# Patient Record
Sex: Male | Born: 1950 | Race: Black or African American | Hispanic: No | Smoking: Current some day smoker
Health system: Southern US, Community
[De-identification: ages and names within clinical notes are randomized; demographics above are authoritative.]

## PROBLEM LIST (undated history)

## (undated) DIAGNOSIS — N189 Chronic kidney disease, unspecified: Secondary | ICD-10-CM

## (undated) DIAGNOSIS — R519 Headache, unspecified: Secondary | ICD-10-CM

## (undated) DIAGNOSIS — R55 Syncope and collapse: Secondary | ICD-10-CM

## (undated) DIAGNOSIS — K219 Gastro-esophageal reflux disease without esophagitis: Secondary | ICD-10-CM

## (undated) DIAGNOSIS — E119 Type 2 diabetes mellitus without complications: Secondary | ICD-10-CM

## (undated) DIAGNOSIS — M1990 Unspecified osteoarthritis, unspecified site: Secondary | ICD-10-CM

## (undated) DIAGNOSIS — E7800 Pure hypercholesterolemia, unspecified: Secondary | ICD-10-CM

## (undated) DIAGNOSIS — G4730 Sleep apnea, unspecified: Secondary | ICD-10-CM

## (undated) DIAGNOSIS — I10 Essential (primary) hypertension: Secondary | ICD-10-CM

## (undated) DIAGNOSIS — M109 Gout, unspecified: Secondary | ICD-10-CM

## (undated) DIAGNOSIS — R269 Unspecified abnormalities of gait and mobility: Secondary | ICD-10-CM

## (undated) DIAGNOSIS — R0602 Shortness of breath: Secondary | ICD-10-CM

## (undated) DIAGNOSIS — Z972 Presence of dental prosthetic device (complete) (partial): Secondary | ICD-10-CM

## (undated) DIAGNOSIS — I2699 Other pulmonary embolism without acute cor pulmonale: Secondary | ICD-10-CM

## (undated) HISTORY — DX: Type 2 diabetes mellitus without complications: E11.9

## (undated) HISTORY — PX: COLONOSCOPY: SHX174

## (undated) HISTORY — DX: Other pulmonary embolism without acute cor pulmonale: I26.99

## (undated) HISTORY — DX: Pure hypercholesterolemia, unspecified: E78.00

## (undated) HISTORY — PX: WRIST SURGERY: SHX841

## (undated) HISTORY — PX: MULTIPLE TOOTH EXTRACTIONS: SHX2053

## (undated) HISTORY — DX: Chronic kidney disease, unspecified: N18.9

## (undated) HISTORY — DX: Syncope and collapse: R55

## (undated) HISTORY — DX: Unspecified abnormalities of gait and mobility: R26.9

## (undated) HISTORY — DX: Essential (primary) hypertension: I10

## (undated) HISTORY — DX: Gout, unspecified: M10.9

## (undated) HISTORY — PX: CERVICAL DISCECTOMY: SHX98

## (undated) HISTORY — DX: Shortness of breath: R06.02

---

## 1993-01-03 HISTORY — PX: OTHER SURGICAL HISTORY: SHX169

## 2015-05-19 ENCOUNTER — Telehealth: Payer: Self-pay | Admitting: Cardiovascular Disease

## 2015-05-19 NOTE — Telephone Encounter (Signed)
Called number that was listed on pt's chart, the number that was listed was the number to a debt collector. There is no other contact number.

## 2015-05-22 DIAGNOSIS — R0600 Dyspnea, unspecified: Secondary | ICD-10-CM

## 2015-05-22 DIAGNOSIS — E7800 Pure hypercholesterolemia, unspecified: Secondary | ICD-10-CM | POA: Insufficient documentation

## 2015-05-22 DIAGNOSIS — M109 Gout, unspecified: Secondary | ICD-10-CM | POA: Insufficient documentation

## 2015-05-22 DIAGNOSIS — I2699 Other pulmonary embolism without acute cor pulmonale: Secondary | ICD-10-CM | POA: Insufficient documentation

## 2015-05-22 DIAGNOSIS — N189 Chronic kidney disease, unspecified: Secondary | ICD-10-CM | POA: Insufficient documentation

## 2015-05-22 DIAGNOSIS — R0609 Other forms of dyspnea: Secondary | ICD-10-CM

## 2015-05-25 ENCOUNTER — Telehealth: Payer: Self-pay | Admitting: Cardiovascular Disease

## 2015-05-25 ENCOUNTER — Encounter: Payer: Self-pay | Admitting: Cardiovascular Disease

## 2015-05-25 NOTE — Telephone Encounter (Signed)
No show for new patient visit today. He can be rescheduled with any cardiology provider. cdm

## 2015-05-25 NOTE — Progress Notes (Signed)
No show

## 2015-05-26 ENCOUNTER — Encounter: Payer: Self-pay | Admitting: Cardiovascular Disease

## 2015-10-03 ENCOUNTER — Emergency Department (HOSPITAL_COMMUNITY)
Admission: EM | Admit: 2015-10-03 | Discharge: 2015-10-03 | Disposition: A | Discharge status: Home / Self Care | Payer: Medicare HMO | Attending: Emergency Medicine | Admitting: Emergency Medicine

## 2015-10-03 ENCOUNTER — Emergency Department (HOSPITAL_COMMUNITY): Payer: Medicare HMO

## 2015-10-03 ENCOUNTER — Encounter (HOSPITAL_COMMUNITY): Payer: Self-pay

## 2015-10-03 DIAGNOSIS — S91115A Laceration without foreign body of left lesser toe(s) without damage to nail, initial encounter: Secondary | ICD-10-CM | POA: Diagnosis not present

## 2015-10-03 DIAGNOSIS — E1122 Type 2 diabetes mellitus with diabetic chronic kidney disease: Secondary | ICD-10-CM | POA: Insufficient documentation

## 2015-10-03 DIAGNOSIS — Y9301 Activity, walking, marching and hiking: Secondary | ICD-10-CM | POA: Diagnosis not present

## 2015-10-03 DIAGNOSIS — I129 Hypertensive chronic kidney disease with stage 1 through stage 4 chronic kidney disease, or unspecified chronic kidney disease: Secondary | ICD-10-CM | POA: Insufficient documentation

## 2015-10-03 DIAGNOSIS — F17210 Nicotine dependence, cigarettes, uncomplicated: Secondary | ICD-10-CM | POA: Insufficient documentation

## 2015-10-03 DIAGNOSIS — Y999 Unspecified external cause status: Secondary | ICD-10-CM | POA: Insufficient documentation

## 2015-10-03 DIAGNOSIS — Z79899 Other long term (current) drug therapy: Secondary | ICD-10-CM | POA: Diagnosis not present

## 2015-10-03 DIAGNOSIS — W2203XA Walked into furniture, initial encounter: Secondary | ICD-10-CM | POA: Diagnosis not present

## 2015-10-03 DIAGNOSIS — Y929 Unspecified place or not applicable: Secondary | ICD-10-CM | POA: Insufficient documentation

## 2015-10-03 DIAGNOSIS — N189 Chronic kidney disease, unspecified: Secondary | ICD-10-CM | POA: Diagnosis not present

## 2015-10-03 DIAGNOSIS — S91119A Laceration without foreign body of unspecified toe without damage to nail, initial encounter: Secondary | ICD-10-CM

## 2015-10-03 IMAGING — CR DG TOE 3RD 2+V*L*
3 series · 3 of 3 positions shown · non-contrast
Comparison: None.

CLINICAL DATA: Left third toe pain after injury this morning.
Lacerations to third and fourth toes.

EXAM:
LEFT THIRD TOE

[toe ap]
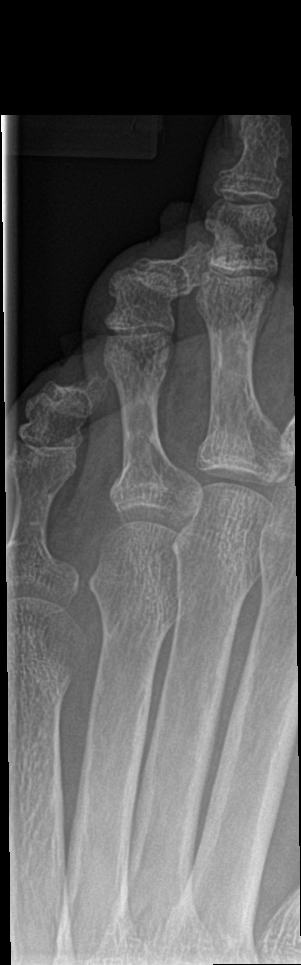

[toe obl]
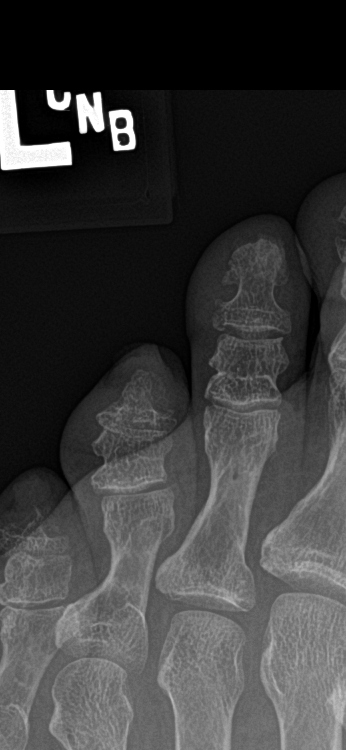

[toe lat]
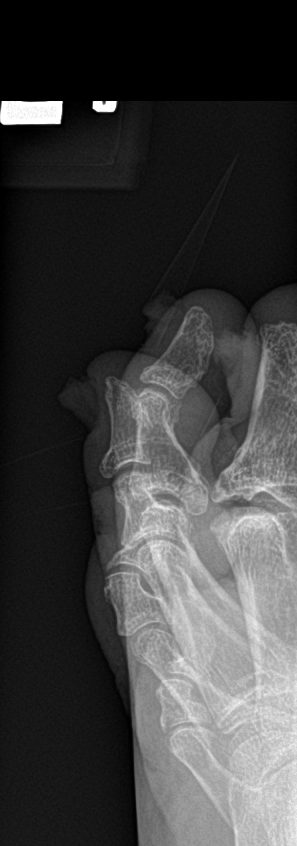

[3 of 3 positions shown; findings below may reference images not displayed]

FINDINGS: No osseous fracture line or displaced fracture fragment identified.
No joint space malalignment seen. Adjacent soft tissues are
unremarkable.
IMPRESSION: Negative.

## 2015-10-03 MED ORDER — HYDROCODONE-ACETAMINOPHEN 5-325 MG PO TABS: 1.0000 | ORAL_TABLET | Freq: Four times a day (QID) | ORAL | 0 refills | Status: DC | PRN

## 2015-10-03 MED ORDER — LIDOCAINE HCL 2 % IJ SOLN
10.0000 mL | Freq: Once | INTRAMUSCULAR | Status: AC
Start: 2015-10-03 — End: 2015-10-03
  Administered 2015-10-03: 200 mg via INTRADERMAL
  Filled 2015-10-03: qty 20

## 2015-10-03 NOTE — ED Triage Notes (Signed)
Onset this morning pt stepped on piece of bed cutting left middle toe on sole side.  Bleeding has not stopped.

## 2015-10-03 NOTE — ED Notes (Signed)
Declined W/C at D/C and was escorted to lobby by RN. 

## 2015-10-03 NOTE — Discharge Instructions (Signed)
Please follow up with your doctor or at [facility] in 1 week for wound recheck and sutures removal.  Monitor wound daily for signs of infection.  Take tylenol or ibuprofen at home as needed for pain. Return if you have any concerns.

## 2015-10-03 NOTE — ED Provider Notes (Signed)
[facility] DEPT Provider Note   CSN: [id number] Arrival date & time: 10/03/15  1637  By signing my name below, I, Reola Mosher, attest that this documentation has been scribed under the direction and in the presence of Domenic Moras, PA-C.  Electronically Signed: Reola Mosher, ED Scribe. 10/03/15. 4:57 PM.  History   Chief Complaint Chief Complaint  Patient presents with  . Laceration   The history is provided by the patient. No language interpreter was used.   HPI Comments: Duane Ortiz is a 65 y.o. male with a PMHx of CKD and HTN, who presents to the Emergency Department complaining of a laceration to the plantar aspect of the left third toe sustained ~7 hours PTA. Bleeding is controlled with pressure dressing. Pt reports that he was walking when the plantar aspect of his left 3rd toe came across a sharp edge of his bed, sustaining his laceration. Pt reports only mild pain to the area stemming from chronic decreased sensation to the plantar aspect of his foot. Prior to coming into the ED pt states that he only applied pressure dressing to control his bleeding, and notes that no other treatments were tried. Pt is currently taking Xarelto which he has been on for ~2 years for his hx of PE. Pt has been compliant with his daily anticoagulant medications. Pt denies a hx of DM. Tetanus it UTD. No other associated symptoms or complaints.   Past Medical History:  Diagnosis Date  . Chronic kidney disease   . Chronic kidney disease   . Diabetes ([place]) 02/012017  . Fainting   . Gout   . Hypercholesteremia   . Hypercholesterolemia   . Hypertension   . Pulmonary embolism ([place])   . Shortness of breath    Patient Active Problem List   Diagnosis Date Noted  . Dyspnea on exertion 05/22/2015  . Hypercholesteremia   . Gout   . Pulmonary embolism ([place])   . Chronic kidney disease   . Hypercholesterolemia    Past Surgical History:  Procedure Laterality Date  . CERVICAL  DISCECTOMY    . COLONOSCOPY    . neck spine disk surgery  01/03/1993    Home Medications    Prior to Admission medications   Medication Sig Start Date End Date Taking? Authorizing Provider  allopurinol (ZYLOPRIM) 100 MG tablet Take 100 mg by mouth daily.    Historical Provider, MD  amLODipine-valsartan (EXFORGE) 10-320 MG tablet Take 1 tablet by mouth daily.    Historical Provider, MD  atorvastatin (LIPITOR) 20 MG tablet Take 20 mg by mouth daily.    Historical Provider, MD  cloNIDine HCl (KAPVAY) 0.1 MG TB12 ER tablet Take 0.1 mg by mouth 2 (two) times daily.    Historical Provider, MD  fenofibrate micronized (LOFIBRA) 134 MG capsule Take 134 mg by mouth daily before breakfast.    Historical Provider, MD  fluticasone (FLONASE) 50 MCG/ACT nasal spray Place into both nostrils daily.    Historical Provider, MD  furosemide (LASIX) 20 MG tablet Take 20 mg by mouth.    Historical Provider, MD  HYDROcodone-acetaminophen (NORCO/VICODIN) 5-325 MG tablet Take 1 tablet by mouth every 6 (six) hours as needed for moderate pain.    Historical Provider, MD  nebivolol (BYSTOLIC) 10 MG tablet Take 10 mg by mouth daily.    Historical Provider, MD  omeprazole (PRILOSEC) 20 MG capsule Take 20 mg by mouth daily.    Historical Provider, MD  pravastatin (PRAVACHOL) 40 MG tablet Take 40 mg by  mouth daily.    Historical Provider, MD  pregabalin (LYRICA) 75 MG capsule Take 75 mg by mouth 2 (two) times daily.    Historical Provider, MD  rivaroxaban (XARELTO) 20 MG TABS tablet Take 20 mg by mouth daily with supper.    Historical Provider, MD  spironolactone (ALDACTONE) 50 MG tablet Take 50 mg by mouth daily.    Historical Provider, MD  tamsulosin (FLOMAX) 0.4 MG CAPS capsule Take 0.4 mg by mouth daily.    Historical Provider, MD  traZODone (DESYREL) 50 MG tablet Take 50 mg by mouth at bedtime.    Historical Provider, MD   Family History Family History  Problem Relation Age of Onset  . Diabetes Mother   . Heart  disease Mother   . Hypertension Mother   . Diabetes Sister   . Hypertension Sister   . Diabetes Brother   . Hypertension Brother   . Breast cancer Paternal Grandmother    Social History Social History  Substance Use Topics  . Smoking status: Current Every Day Smoker    Packs/day: 0.25    Types: Cigarettes  . Smokeless tobacco: Never Used  . Alcohol use 0.0 oz/week   Allergies   Sulfa antibiotics  Review of Systems Review of Systems  Musculoskeletal: Positive for myalgias.  Skin: Positive for wound.  Neurological: Positive for numbness.   Physical Exam Updated Vital Signs BP 142/65 (BP Location: Left Arm)   Pulse 62   Temp 97.7 F (36.5 C) (Oral)   Resp 14   SpO2 100%   Physical Exam  Constitutional: He appears well-developed and well-nourished.  HENT:  Head: Normocephalic.  Eyes: Conjunctivae are normal.  Cardiovascular: Normal rate.   Pulmonary/Chest: Effort normal. No respiratory distress.  Abdominal: He exhibits no distension.  Musculoskeletal:  Left foot over 3rd toe involving the IP joint w/ a complete deep 2cm laceration to the the ventral aspect of the digit w/ bone exposure. Decreased sensation to the toe but area with brisk capillary refill.   Neurological: He is alert.  Skin: Skin is warm and dry.  Psychiatric: He has a normal mood and affect. His behavior is normal.  Nursing note and vitals reviewed.  ED Treatments / Results  DIAGNOSTIC STUDIES: Oxygen Saturation is 100% on RA, normal by my interpretation.   COORDINATION OF CARE: 4:51 PM-Discussed next steps with pt. Pt verbalized understanding and is agreeable with the plan.   Radiology Dg Toe 3rd Left  Result Date: 10/03/2015 CLINICAL DATA:  Left third toe pain after injury this morning. Lacerations to third and fourth toes. EXAM: LEFT THIRD TOE COMPARISON:  None. FINDINGS: No osseous fracture line or displaced fracture fragment identified. No joint space malalignment seen. Adjacent soft  tissues are unremarkable. IMPRESSION: Negative. Electronically Signed   By: Franki Cabot M.D.   On: 10/03/2015 17:44   Procedures .Marland KitchenLaceration Repair Date/Time: 10/03/2015 6:06 PM Performed by: Domenic Moras Authorized by: Domenic Moras   Consent:    Consent obtained:  Verbal   Consent given by:  Patient   Risks discussed:  Infection, pain and poor wound healing   Alternatives discussed:  No treatment Universal protocol:    Procedure explained and questions answered to patient or proxy's satisfaction: yes     Relevant documents present and verified: yes     Test results available and properly labeled: yes     Imaging studies available: yes     Required blood products, implants, devices, and special equipment available: yes  Site/side marked: yes     Immediately prior to procedure, a time out was called: yes     Patient identity confirmed:  Verbally with patient Anesthesia (see MAR for exact dosages):    Anesthesia method:  Nerve block   Block location:  Left third toe   Block needle gauge:  25 G   Block anesthetic:  Lidocaine 2% w/o epi   Block technique:  Digital   Block outcome:  Anesthesia achieved Laceration details:    Location:  Foot   Foot location:  Sole of L foot   Length (cm):  2   Laceration depth: deep. Repair type:    Repair type:  Simple Pre-procedure details:    Preparation:  Patient was prepped and draped in usual sterile fashion and imaging obtained to evaluate for foreign bodies Exploration:    Wound exploration: wound explored through full range of motion and entire depth of wound probed and visualized     Wound extent: no tendon damage noted     Contaminated: no   Treatment:    Amount of cleaning:  Extensive   Irrigation solution:  Sterile saline   Irrigation method:  Pressure wash   Visualized foreign bodies/material removed: no   Skin repair:    Repair method:  Sutures   Suture size:  5-0   Suture material:  Prolene   Suture technique:  Simple  interrupted   Number of sutures:  5 Approximation:    Approximation:  Close   Vermilion border: well-aligned   Post-procedure details:    Dressing:  Antibiotic ointment and sterile dressing   Patient tolerance of procedure:  Tolerated well, no immediate complications      Medications Ordered in ED Medications  lidocaine (XYLOCAINE) 2 % (with pres) injection 200 mg (200 mg Intradermal Given 10/03/15 1703)   Initial Impression / Assessment and Plan / ED Course  I have reviewed the triage vital signs and the nursing notes.  Pertinent labs & imaging results that were available during my care of the patient were reviewed by me and considered in my medical decision making (see chart for details).  Clinical Course   Pt is 65 yo male who presents to the ED with complete deep skin avulsion at approximately 2cm in length, involving the IP region of the left third toe s/p stepping on a sharp object approximately 7 hours ago. Bleeding controlled while in the ED. Tdap booster UTD. Pressure irrigation performed. Bottom of the wound visualized with bleeding controled. Laceration occurred <8 hours prior to repair which was well tolerated. Pertinent xray of the left 3rd toe reveals no bone abnormality or foreign body involvement. Pt is currently on Xarelto for his hx of PE ~2 years ago and has been compliant with this medication. Discussed suture home care w/ pt and answered questions. Pt to f-u for wound check and suture removal in ~7 days. Pt is hemodynamically stable w no complaints prior to dc. Care discussed with Dr. Tyrone Nine.   Final Clinical Impressions(s) / ED Diagnoses   Final diagnoses:  Toe laceration, initial encounter   New Prescriptions New Prescriptions   No medications on file   I personally performed the services described in this documentation, which was scribed in my presence. The recorded information has been reviewed and is accurate.       Domenic Moras, PA-C 10/03/15 [place], DO 10/03/15 [place], DO 10/03/15 2309

## 2015-10-10 ENCOUNTER — Encounter (HOSPITAL_COMMUNITY): Payer: Self-pay | Admitting: *Deleted

## 2015-10-10 ENCOUNTER — Emergency Department (HOSPITAL_COMMUNITY)
Admission: EM | Admit: 2015-10-10 | Discharge: 2015-10-10 | Disposition: A | Discharge status: Home / Self Care | Payer: Medicare HMO | Attending: Emergency Medicine | Admitting: Emergency Medicine

## 2015-10-10 DIAGNOSIS — E1122 Type 2 diabetes mellitus with diabetic chronic kidney disease: Secondary | ICD-10-CM | POA: Diagnosis not present

## 2015-10-10 DIAGNOSIS — Z7901 Long term (current) use of anticoagulants: Secondary | ICD-10-CM | POA: Insufficient documentation

## 2015-10-10 DIAGNOSIS — N189 Chronic kidney disease, unspecified: Secondary | ICD-10-CM | POA: Diagnosis not present

## 2015-10-10 DIAGNOSIS — I129 Hypertensive chronic kidney disease with stage 1 through stage 4 chronic kidney disease, or unspecified chronic kidney disease: Secondary | ICD-10-CM | POA: Diagnosis not present

## 2015-10-10 DIAGNOSIS — F17210 Nicotine dependence, cigarettes, uncomplicated: Secondary | ICD-10-CM | POA: Insufficient documentation

## 2015-10-10 DIAGNOSIS — Z4802 Encounter for removal of sutures: Secondary | ICD-10-CM | POA: Diagnosis not present

## 2015-10-10 NOTE — ED Triage Notes (Signed)
Pt here for suture removal to left foot. He has no complaints. Denies fever or drainage. No acute distress noted at triage.

## 2015-10-10 NOTE — Discharge Instructions (Signed)
Please read and follow all provided instructions.  Your diagnoses today include:  1. Visit for suture removal    Tests performed today include: Vital signs. See below for your results today.   Medications prescribed:  Take as prescribed   Home care instructions:  Follow any educational materials contained in this packet.  Follow-up instructions: Please follow-up with your primary care provider for further evaluation of symptoms and treatment   Return instructions:  Please return to the Emergency Department if you do not get better, if you get worse, or new symptoms OR  - Fever (temperature greater than 101.81F)  - Bleeding that does not stop with holding pressure to the area    -Severe pain (please note that you may be more sore the day after your accident)  - Chest Pain  - Difficulty breathing  - Severe nausea or vomiting  - Inability to tolerate food and liquids  - Passing out  - Skin becoming red around your wounds  - Change in mental status (confusion or lethargy)  - New numbness or weakness    Please return if you have any other emergent concerns.  Additional Information:  Your vital signs today were: BP 122/61 (BP Location: Left Arm)    Pulse 61    Temp 97.8 F (36.6 C) (Oral)    Resp 14    SpO2 100%  If your blood pressure (BP) was elevated above 135/85 this visit, please have this repeated by your doctor within one month. ---------------

## 2015-10-10 NOTE — ED Notes (Signed)
Declined W/C at D/C and was escorted to lobby by RN. 

## 2015-10-10 NOTE — ED Provider Notes (Signed)
[facility] DEPT Provider Note   CSN: [id number] Arrival date & time: 10/10/15  1325   By signing my name below, I, Macon Large, attest that this documentation has been prepared under the direction and in the presence of  Shary Decamp PA-C. Electronically Signed: Macon Large, ED Scribe. 10/10/15. 2:38 PM.   History   Chief Complaint Chief Complaint  Patient presents with  . Suture / Staple Removal    The history is provided by the patient. No language interpreter was used.    HPI Comments: Duane Ortiz is a 65 y.o. male who presents to the Emergency Department requesting suture removal today. Pt was seen on 10/03/2015 for a laceration to the plantar aspect of the left foot; per chart review Pt had 5 sutures placed. He denies associated drainage, redness, and increased pain.   Past Medical History:  Diagnosis Date  . Chronic kidney disease   . Chronic kidney disease   . Diabetes ([place]) 02/012017  . Fainting   . Gout   . Hypercholesteremia   . Hypercholesterolemia   . Hypertension   . Pulmonary embolism ([place])   . Shortness of breath     Patient Active Problem List   Diagnosis Date Noted  . Dyspnea on exertion 05/22/2015  . Hypercholesteremia   . Gout   . Pulmonary embolism ([place])   . Chronic kidney disease   . Hypercholesterolemia     Past Surgical History:  Procedure Laterality Date  . CERVICAL DISCECTOMY    . COLONOSCOPY    . neck spine disk surgery  01/03/1993     Home Medications    Prior to Admission medications   Medication Sig Start Date End Date Taking? Authorizing Provider  allopurinol (ZYLOPRIM) 100 MG tablet Take 100 mg by mouth daily.    Historical Provider, MD  amLODipine-valsartan (EXFORGE) 10-320 MG tablet Take 1 tablet by mouth daily.    Historical Provider, MD  atorvastatin (LIPITOR) 20 MG tablet Take 20 mg by mouth daily.    Historical Provider, MD  cloNIDine HCl (KAPVAY) 0.1 MG TB12 ER tablet Take 0.1 mg by mouth 2 (two) times  daily.    Historical Provider, MD  fenofibrate micronized (LOFIBRA) 134 MG capsule Take 134 mg by mouth daily before breakfast.    Historical Provider, MD  fluticasone (FLONASE) 50 MCG/ACT nasal spray Place into both nostrils daily.    Historical Provider, MD  furosemide (LASIX) 20 MG tablet Take 20 mg by mouth.    Historical Provider, MD  HYDROcodone-acetaminophen (NORCO/VICODIN) 5-325 MG tablet Take 1 tablet by mouth every 6 (six) hours as needed for moderate pain. 10/03/15   Domenic Moras, PA-C  nebivolol (BYSTOLIC) 10 MG tablet Take 10 mg by mouth daily.    Historical Provider, MD  omeprazole (PRILOSEC) 20 MG capsule Take 20 mg by mouth daily.    Historical Provider, MD  pravastatin (PRAVACHOL) 40 MG tablet Take 40 mg by mouth daily.    Historical Provider, MD  pregabalin (LYRICA) 75 MG capsule Take 75 mg by mouth 2 (two) times daily.    Historical Provider, MD  rivaroxaban (XARELTO) 20 MG TABS tablet Take 20 mg by mouth daily with supper.    Historical Provider, MD  spironolactone (ALDACTONE) 50 MG tablet Take 50 mg by mouth daily.    Historical Provider, MD  tamsulosin (FLOMAX) 0.4 MG CAPS capsule Take 0.4 mg by mouth daily.    Historical Provider, MD  traZODone (DESYREL) 50 MG tablet Take 50 mg by mouth at bedtime.  Historical Provider, MD    Family History Family History  Problem Relation Age of Onset  . Diabetes Mother   . Heart disease Mother   . Hypertension Mother   . Diabetes Sister   . Hypertension Sister   . Diabetes Brother   . Hypertension Brother   . Breast cancer Paternal Grandmother     Social History Social History  Substance Use Topics  . Smoking status: Current Every Day Smoker    Packs/day: 0.25    Types: Cigarettes  . Smokeless tobacco: Never Used  . Alcohol use 0.0 oz/week     Allergies   Sulfa antibiotics   Review of Systems Review of Systems  Constitutional: Negative for fever.  Skin: Positive for wound.   Physical Exam Updated Vital  Signs BP 122/61 (BP Location: Left Arm)   Pulse 61   Temp 97.8 F (36.6 C) (Oral)   Resp 14   SpO2 100%   Physical Exam  Constitutional: He is oriented to person, place, and time. Vital signs are normal. He appears well-developed and well-nourished.  HENT:  Head: Normocephalic and atraumatic.  Right Ear: Hearing normal.  Left Ear: Hearing normal.  Eyes: Conjunctivae and EOM are normal. Pupils are equal, round, and reactive to light. Right eye exhibits no discharge. Left eye exhibits no discharge.  Cardiovascular: Normal rate and regular rhythm.   Pulmonary/Chest: Effort normal. No respiratory distress.  Musculoskeletal:  Well healed 2 cm laceration to the ventral aspect of the left third toe. Scabbing noted. No erythema. No drainage. No signs of infection. Four prolene sutures noted   Neurological: He is alert and oriented to person, place, and time. Coordination normal.  Skin: Skin is warm and dry. No rash noted. He is not diaphoretic. No erythema.  Psychiatric: He has a normal mood and affect. His speech is normal and behavior is normal. Thought content normal.  Nursing note and vitals reviewed.  ED Treatments / Results   DIAGNOSTIC STUDIES: Oxygen Saturation is 100% on RA, normal by my interpretation.    COORDINATION OF CARE: 2:38 PM Discussed treatment plan with pt at bedside and pt agreed to plan.  Labs (all labs ordered are listed, but only abnormal results are displayed) Labs Reviewed - No data to display  EKG  EKG Interpretation None      Radiology No results found.  Procedures .Suture Removal Date/Time: 10/10/2015 2:38 PM Performed by: Shary Decamp Authorized by: Shary Decamp   Consent:    Consent obtained:  Verbal   Consent given by:  Patient Location:    Location:  Lower extremity   Lower extremity location:  Toe   Toe location:  L third toe Procedure details:    Wound appearance:  No signs of infection   Number of sutures removed:   4 Post-procedure details:    Patient tolerance of procedure:  Tolerated well, no immediate complications   (including critical care time)  Medications Ordered in ED Medications - No data to display   Initial Impression / Assessment and Plan / ED Course  I have reviewed the triage vital signs and the nursing notes.  Pertinent labs & imaging results that were available during my care of the patient were reviewed by me and considered in my medical decision making (see chart for details).  Clinical Course  I have reviewed the relevant previous healthcare records. I obtained HPI from historian. Patient discussed with supervising physician  ED Course:  Assessment: Suture removal   Pt to ER for  suture removal and wound check as above. Procedure tolerated well. 4 sutures were removed even though documentation reports 5. Explored wound throughly without evidence of 5th suture placement. Discussed risks of retained foreign bodies, but I believe it is highly unlikely that a 5th was placed. Most likely documentation error. Pt confirmed that he was told verbally that 4 sutures were placed. Vitals normal, no signs of infection. Scar minimization & return precautions given at dc.   Disposition/Plan:  DC Home Additional Verbal discharge instructions given and discussed with patient.  Pt Instructed to f/u with PCP in the next week for evaluation and treatment of symptoms. Return precautions given Pt acknowledges and agrees with plan  Supervising Physician No att. providers found   Final Clinical Impressions(s) / ED Diagnoses   Final diagnoses:  Visit for suture removal    New Prescriptions Discharge Medication List as of 10/10/2015  2:13 PM     I personally performed the services described in this documentation, which was scribed in my presence. The recorded information has been reviewed and is accurate.     Shary Decamp, PA-C 10/10/15 1500    Dorie Rank, MD 10/11/15 [phone]

## 2016-02-16 ENCOUNTER — Emergency Department (HOSPITAL_COMMUNITY)
Admission: EM | Admit: 2016-02-16 | Discharge: 2016-02-17 | Disposition: A | Discharge status: Home / Self Care | Payer: Medicare HMO | Attending: Emergency Medicine | Admitting: Emergency Medicine

## 2016-02-16 ENCOUNTER — Encounter (HOSPITAL_COMMUNITY): Payer: Self-pay | Admitting: Emergency Medicine

## 2016-02-16 DIAGNOSIS — F17210 Nicotine dependence, cigarettes, uncomplicated: Secondary | ICD-10-CM | POA: Diagnosis not present

## 2016-02-16 DIAGNOSIS — Z79899 Other long term (current) drug therapy: Secondary | ICD-10-CM | POA: Diagnosis not present

## 2016-02-16 DIAGNOSIS — Z7901 Long term (current) use of anticoagulants: Secondary | ICD-10-CM | POA: Insufficient documentation

## 2016-02-16 DIAGNOSIS — E1122 Type 2 diabetes mellitus with diabetic chronic kidney disease: Secondary | ICD-10-CM | POA: Diagnosis not present

## 2016-02-16 DIAGNOSIS — I129 Hypertensive chronic kidney disease with stage 1 through stage 4 chronic kidney disease, or unspecified chronic kidney disease: Secondary | ICD-10-CM | POA: Diagnosis not present

## 2016-02-16 DIAGNOSIS — E875 Hyperkalemia: Secondary | ICD-10-CM | POA: Diagnosis not present

## 2016-02-16 DIAGNOSIS — N189 Chronic kidney disease, unspecified: Secondary | ICD-10-CM | POA: Insufficient documentation

## 2016-02-16 LAB — I-STAT TROPONIN, ED: Troponin i, poc: 0 ng/mL (ref 0.00–0.08)

## 2016-02-16 LAB — BASIC METABOLIC PANEL
ANION GAP: 9 (ref 5–15)
BUN: 46 mg/dL — ABNORMAL HIGH (ref 6–20)
CALCIUM: 9.3 mg/dL (ref 8.9–10.3)
CHLORIDE: 112 mmol/L — AB (ref 101–111)
CO2: 16 mmol/L — AB (ref 22–32)
Creatinine, Ser: 2.97 mg/dL — ABNORMAL HIGH (ref 0.61–1.24)
GFR calc Af Amer: 24 mL/min — ABNORMAL LOW (ref >60)
GFR calc non Af Amer: 20 mL/min — ABNORMAL LOW (ref >60)
Glucose, Bld: 108 mg/dL — ABNORMAL HIGH (ref 65–99)
Potassium: 5.3 mmol/L — ABNORMAL HIGH (ref 3.5–5.1)
Sodium: 137 mmol/L (ref 135–145)

## 2016-02-16 LAB — CBC
HCT: 28 % — ABNORMAL LOW (ref 39.0–52.0)
HEMOGLOBIN: 9 g/dL — AB (ref 13.0–17.0)
MCH: 27.4 pg (ref 26.0–34.0)
MCHC: 32.1 g/dL (ref 30.0–36.0)
MCV: 85.1 fL (ref 78.0–100.0)
Platelets: 215 10*3/uL (ref 150–400)
RBC: 3.29 MIL/uL — ABNORMAL LOW (ref 4.22–5.81)
RDW: 13.8 % (ref 11.5–15.5)
WBC: 6 10*3/uL (ref 4.0–10.5)

## 2016-02-16 MED ORDER — HYDROXYZINE HCL 10 MG PO TABS
10.0000 mg | ORAL_TABLET | Freq: Once | ORAL | Status: AC
  Administered 2016-02-16: 10 mg via ORAL
  Filled 2016-02-16: qty 1

## 2016-02-16 MED ORDER — SODIUM CHLORIDE 0.9 % IV BOLUS (SEPSIS)
1000.0000 mL | Freq: Once | INTRAVENOUS | Status: AC
Start: 2016-02-16 — End: 2016-02-17
  Administered 2016-02-16: 1000 mL via INTRAVENOUS

## 2016-02-16 NOTE — ED Provider Notes (Signed)
[facility] DEPT Provider Note   CSN: [id number] Arrival date & time: 02/16/16  1911     History   Chief Complaint Chief Complaint  Patient presents with  . Abnormal Lab    HPI Duane Ortiz is a 66 y.o. male.  HPI  Patient presents from home with concern of hyperkalemia. The patient is here with his son who assists with the history of present illness. Patient has known chronic kidney disease, as well as multiple other medical problems. Today, as a part of routine follow-up he had blood draw. He was called after the appointment with notification of critical abnormality, potassium greater than 6.8. He states that he feels generally well, denies chest pain, dyspnea, lightheadedness, syncope. Patient's son states the patient himself has been more sleepy, tired than usual, but also denies any other substantial changes. The patient himself denies these changes. Patient has ongoing evaluation for chronic kidney disease, but is not currently receiving dialysis.  He states that his other medical conditions are well controlled, with no recent changes in medication, diet, activity.   Past Medical History:  Diagnosis Date  . Chronic kidney disease   . Chronic kidney disease   . Diabetes ([place]) 02/012017  . Fainting   . Gout   . Hypercholesteremia   . Hypercholesterolemia   . Hypertension   . Pulmonary embolism ([place])   . Shortness of breath     Patient Active Problem List   Diagnosis Date Noted  . Dyspnea on exertion 05/22/2015  . Hypercholesteremia   . Gout   . Pulmonary embolism ([place])   . Chronic kidney disease   . Hypercholesterolemia     Past Surgical History:  Procedure Laterality Date  . CERVICAL DISCECTOMY    . COLONOSCOPY    . neck spine disk surgery  01/03/1993       Home Medications    Prior to Admission medications   Medication Sig Start Date End Date Taking? Authorizing Provider  allopurinol (ZYLOPRIM) 100 MG tablet Take 100 mg by mouth daily.     Historical Provider, MD  amLODipine-valsartan (EXFORGE) 10-320 MG tablet Take 1 tablet by mouth daily.    Historical Provider, MD  atorvastatin (LIPITOR) 20 MG tablet Take 20 mg by mouth daily.    Historical Provider, MD  cloNIDine HCl (KAPVAY) 0.1 MG TB12 ER tablet Take 0.1 mg by mouth 2 (two) times daily.    Historical Provider, MD  fenofibrate micronized (LOFIBRA) 134 MG capsule Take 134 mg by mouth daily before breakfast.    Historical Provider, MD  fluticasone (FLONASE) 50 MCG/ACT nasal spray Place into both nostrils daily.    Historical Provider, MD  furosemide (LASIX) 20 MG tablet Take 20 mg by mouth.    Historical Provider, MD  HYDROcodone-acetaminophen (NORCO/VICODIN) 5-325 MG tablet Take 1 tablet by mouth every 6 (six) hours as needed for moderate pain. 10/03/15   Domenic Moras, PA-C  nebivolol (BYSTOLIC) 10 MG tablet Take 10 mg by mouth daily.    Historical Provider, MD  omeprazole (PRILOSEC) 20 MG capsule Take 20 mg by mouth daily.    Historical Provider, MD  pravastatin (PRAVACHOL) 40 MG tablet Take 40 mg by mouth daily.    Historical Provider, MD  pregabalin (LYRICA) 75 MG capsule Take 75 mg by mouth 2 (two) times daily.    Historical Provider, MD  rivaroxaban (XARELTO) 20 MG TABS tablet Take 20 mg by mouth daily with supper.    Historical Provider, MD  spironolactone (ALDACTONE) 50 MG tablet Take  50 mg by mouth daily.    Historical Provider, MD  tamsulosin (FLOMAX) 0.4 MG CAPS capsule Take 0.4 mg by mouth daily.    Historical Provider, MD  traZODone (DESYREL) 50 MG tablet Take 50 mg by mouth at bedtime.    Historical Provider, MD    Family History Family History  Problem Relation Age of Onset  . Diabetes Mother   . Heart disease Mother   . Hypertension Mother   . Diabetes Sister   . Hypertension Sister   . Diabetes Brother   . Hypertension Brother   . Breast cancer Paternal Grandmother     Social History Social History  Substance Use Topics  . Smoking status: Current  Some Day Smoker    Packs/day: 0.25    Types: Cigarettes  . Smokeless tobacco: Never Used  . Alcohol use 0.0 oz/week     Allergies   Sulfa antibiotics   Review of Systems Review of Systems  Constitutional:       Per HPI, otherwise negative  HENT:       Per HPI, otherwise negative  Respiratory:       Per HPI, otherwise negative  Cardiovascular:       Per HPI, otherwise negative  Gastrointestinal: Negative for vomiting.  Endocrine:       Negative aside from HPI  Genitourinary:       Neg aside from HPI   Musculoskeletal:       Per HPI, otherwise negative  Skin: Negative.   Allergic/Immunologic: Negative for immunocompromised state.  Neurological: Negative for syncope.     Physical Exam Updated Vital Signs BP 120/67   Pulse 71   Temp 97.9 F (36.6 C) (Oral)   Resp 18   SpO2 98%   Physical Exam  Constitutional: He is oriented to person, place, and time. He appears well-developed. No distress.  HENT:  Head: Normocephalic and atraumatic.  Eyes: Conjunctivae and EOM are normal.  Cardiovascular: Normal rate and regular rhythm.   Pulmonary/Chest: Effort normal. No stridor. No respiratory distress.  Abdominal: He exhibits no distension.  Musculoskeletal: He exhibits no edema.  Neurological: He is alert and oriented to person, place, and time.  Skin: Skin is warm and dry.     Psychiatric: He has a normal mood and affect.  Nursing note and vitals reviewed.    ED Treatments / Results  Labs (all labs ordered are listed, but only abnormal results are displayed) Labs Reviewed  BASIC METABOLIC PANEL - Abnormal; Notable for the following:       Result Value   Potassium 5.3 (*)    Chloride 112 (*)    CO2 16 (*)    Glucose, Bld 108 (*)    BUN 46 (*)    Creatinine, Ser 2.97 (*)    GFR calc non Af Amer 20 (*)    GFR calc Af Amer 24 (*)    All other components within normal limits  CBC - Abnormal; Notable for the following:    RBC 3.29 (*)    Hemoglobin 9.0 (*)      HCT 28.0 (*)    All other components within normal limits  I-STAT TROPOININ, ED    EKG  EKG Interpretation  Date/Time:  Tuesday February 16 2016 20:04:00 EST Ventricular Rate:  60 PR Interval:  242 QRS Duration: 74 QT Interval:  408 QTC Calculation: 408 R Axis:   39 Text Interpretation:  Sinus rhythm with sinus arrhythmia with 1st degree A-V block Low voltage QRS  Artifact Abnormal ekg Confirmed by Carmin Muskrat  MD [phone]) on 02/16/2016 10:11:23 PM        Procedures Procedures (including critical care time)  Medications Ordered in ED Medications  sodium chloride 0.9 % bolus 1,000 mL (not administered)   Initial labs reassuring with potassium 5.3, substantially down from reported value at outpatient care. Patient will receive IV fluids, half potassium rechecked.  Repeat evaluation reassuring, potassium unremarkable I discussed the importance of following with primary care for discussion of medications, with consideration of this contributing to his hyperkalemia in addition to his ongoing renal disease contribution.  This gentleman with no chronic kidney disease presents with concern of hyperkalemia. Patient has unremarkable rash, is otherwise in no distress, is awake, alert, speaking clearly. EKG reassuring, No evidence for arrhythmia, nor substantial T-wave changes. Review of medications is notable for the patient taking spironolactone, recently stopping Lasix. Patient will be instructed to stop taking spironolactone. Patient discharged with close outpatient follow-up.  Initial Impression / Assessment and Plan / ED Course  I have reviewed the triage vital signs and the nursing notes.  Pertinent labs & imaging results that were available during my care of the patient were reviewed by me and considered in my medical decision making (see chart for details).      Carmin Muskrat, MD 02/16/16 2326

## 2016-02-16 NOTE — ED Triage Notes (Signed)
Patient arrives with hyperkalemia. Received call from nephrology today after visit and was informed of critical K+ @ 6.9.

## 2016-02-16 NOTE — Discharge Instructions (Signed)
As discussed, with your identified hyperkalemia, or elevated potassium is important that you monitor your condition carefully, and be sure to follow-up with your physician to discuss your medication.  Return here for concerning changes in your condition.

## 2016-02-17 MED ORDER — HYDROXYZINE HCL 10 MG PO TABS: 10.0000 mg | ORAL_TABLET | Freq: Three times a day (TID) | ORAL | 0 refills | Status: DC | PRN

## 2016-08-25 DIAGNOSIS — M5136 Other intervertebral disc degeneration, lumbar region: Secondary | ICD-10-CM | POA: Insufficient documentation

## 2016-08-25 DIAGNOSIS — M48062 Spinal stenosis, lumbar region with neurogenic claudication: Secondary | ICD-10-CM | POA: Insufficient documentation

## 2016-08-25 DIAGNOSIS — E1142 Type 2 diabetes mellitus with diabetic polyneuropathy: Secondary | ICD-10-CM | POA: Insufficient documentation

## 2016-10-11 ENCOUNTER — Ambulatory Visit (INDEPENDENT_AMBULATORY_CARE_PROVIDER_SITE_OTHER): Payer: Medicare HMO | Admitting: Neurology

## 2016-10-11 ENCOUNTER — Encounter: Payer: Self-pay | Admitting: Neurology

## 2016-10-11 VITALS — BP 134/78 | HR 63 | Ht 66.0 in | Wt 223.0 lb

## 2016-10-11 DIAGNOSIS — R269 Unspecified abnormalities of gait and mobility: Secondary | ICD-10-CM | POA: Diagnosis not present

## 2016-10-11 DIAGNOSIS — E538 Deficiency of other specified B group vitamins: Secondary | ICD-10-CM | POA: Diagnosis not present

## 2016-10-11 HISTORY — DX: Unspecified abnormalities of gait and mobility: R26.9

## 2016-10-11 NOTE — Patient Instructions (Addendum)
    We will get blood work today and get EMG and NCV study results that were done before.

## 2016-10-11 NOTE — Progress Notes (Signed)
Reason for visit: Dizziness, gait instability  Referring physician: Dr. Antionette Poles is a 66 y.o. male  History of present illness:  Duane Ortiz is a 66 year old right-handed black male with a history of some difficulty with gait instability that he claims has been present since 1995. The patient underwent cervical spine surgery in [place], and since that time he has had some trouble with mild gait instability. This has gradually worsened over time, he reports no recent falls. He does have some residual neck discomfort and some mild low back pain without significant discomfort down the arms or the legs. He has a history of borderline diabetes, diet controlled. He does have some numbness in the feet, he may have some discomfort in the feet that is worse in the evening hours but it does not keep him from sleeping at night. He denies any weakness of the extremities, he has chronic constipation issues but he denies problems controlling the bowels or the bladder. If he stoops down and then comes up quickly or he stands up quickly from a seated position he may have some transient dizziness, again he does not black out. Even with walking a straight line, he may feel somewhat wobbly. He does not use a cane or walker for ambulation. He claims that he had a recent EMG and nerve conduction study done in [place], [place], he does not recall the name of the doctor who did the study. He does not know nothing about the results of the study. He is sent to this office for further evaluation. He is on Xarelto for a blood clot of some sort, he does not know any of the specifics about this.  Past Medical History:  Diagnosis Date  . Chronic kidney disease   . Chronic kidney disease   . Diabetes ([place]) 02/012017  . Fainting   . Gout   . Hypercholesteremia   . Hypercholesterolemia   . Hypertension   . Pulmonary embolism ([place])   . Shortness of breath     Past Surgical History:    Procedure Laterality Date  . CERVICAL DISCECTOMY    . COLONOSCOPY    . neck spine disk surgery  01/03/1993    Family History  Problem Relation Age of Onset  . Diabetes Mother   . Heart disease Mother   . Hypertension Mother   . Diabetes Sister   . Hypertension Sister   . Diabetes Brother   . Hypertension Brother   . Breast cancer Paternal Grandmother     Social history:  reports that he has been smoking Cigarettes.  He has been smoking about 0.25 packs per day. He has never used smokeless tobacco. He reports that he drinks alcohol. He reports that he does not use drugs.  Medications:  Prior to Admission medications   Medication Sig Start Date End Date Taking? Authorizing Provider  allopurinol (ZYLOPRIM) 100 MG tablet Take 100 mg by mouth daily.   Yes [provider]  amLODipine-valsartan (EXFORGE) 10-320 MG tablet Take 1 tablet by mouth daily.   Yes [provider]  cloNIDine (CATAPRES) 0.2 MG tablet Take 0.2 mg by mouth 2 (two) times daily.   Yes [provider]  gabapentin (NEURONTIN) 100 MG capsule Take 200 mg by mouth 2 (two) times daily.   Yes [provider]  hydrOXYzine (ATARAX/VISTARIL) 10 MG tablet Take 1 tablet (10 mg total) by mouth every 8 (eight) hours as needed. 02/17/16  Yes Carmin Muskrat, MD  nebivolol (BYSTOLIC) 10 MG tablet Take 10 mg by mouth daily.   Yes [provider]  pravastatin (PRAVACHOL) 40 MG tablet Take 40 mg by mouth daily.   Yes [provider]  rivaroxaban (XARELTO) 20 MG TABS tablet Take 20 mg by mouth every morning.    Yes [provider]  tamsulosin (FLOMAX) 0.4 MG CAPS capsule Take 0.4 mg by mouth daily.   Yes [provider]      Allergies  Allergen Reactions  . Sulfa Antibiotics Itching    ROS:  Out of a complete 14 system review of symptoms, the patient complains only of the following symptoms, and all other reviewed systems are negative.  Fatigue Swelling in  the legs Skin rash Blurred vision Shortness of breath, snoring Constipation Increased thirst Joint swelling, muscle cramps Numbness, dizziness  Blood pressure 134/78, pulse 63, height 5\' 6"  (1.676 m), weight 223 lb (101.2 kg).   Blood pressure, right arm, standing is 136/76. Low pressure, right arm, sitting is 148/80.  Physical Exam  General: The patient is alert and cooperative at the time of the examination. The patient is moderately obese.  Eyes: Pupils are equal, round, and reactive to light. Discs are flat bilaterally.  Neck: The neck is supple, no carotid bruits are noted.  Respiratory: The respiratory examination is clear.  Cardiovascular: The cardiovascular examination reveals a regular rate and rhythm, no obvious murmurs or rubs are noted.  Skin: Extremities are with 2-3+ edema below the knees bilaterally.  Neurologic Exam  Mental status: The patient is alert and oriented x 3 at the time of the examination. The patient has apparent normal recent and remote memory, with an apparently normal attention span and concentration ability.  Cranial nerves: Facial symmetry is present. There is good sensation of the face to pinprick and soft touch bilaterally. The strength of the facial muscles and the muscles to head turning and shoulder shrug are normal bilaterally. Speech is well enunciated, no aphasia or dysarthria is noted. Extraocular movements are full. Visual fields are full. The tongue is midline, and the patient has symmetric elevation of the soft palate. No obvious hearing deficits are noted.  Motor: The motor testing reveals 5 over 5 strength of all 4 extremities. Good symmetric motor tone is noted throughout.  Sensory: Sensory testing is intact to pinprick, soft touch, vibration sensation, and position sense on all 4 extremities, with exception of decreased pinprick sensation of the knee on the right leg, decreased position sense in the right foot. Position sense is  intact on the left foot. No evidence of extinction is noted.  Coordination: Cerebellar testing reveals good finger-nose-finger and heel-to-shin bilaterally.  Gait and station: Gait is minimally wide-based. The patient is able to ambulate independently. Tandem gait is unsteady. Romberg is negative. No drift is seen.  Reflexes: Deep tendon reflexes are symmetric and normal bilaterally, with exception of reduction of ankle jerk reflexes bilaterally. Toes are downgoing bilaterally.   Assessment/Plan:  1. Diabetes, diet controlled  2. Mild gait disturbance, dizziness  The patient has a history of some gait instability since cervical spine surgery done in 1995. It is not clear whether the patient may have had some spinal cord impingement prior to the surgery. The patient has had a recent EMG and nerve conduction study, we will need to get the report of this. The patient will have blood work done today. The brief dizziness that he has with stooping or standing up may be a mild transient drop in  blood pressure that occurs with a change in position. The patient is not overtly orthostatic today. He will follow-up in 3-4 months.  [name] MD 10/11/2016 2:03 PM  [facility] [address] [place], [place] [address]  Phone [phone] Fax [phone]

## 2016-10-13 ENCOUNTER — Telehealth: Payer: Self-pay | Admitting: *Deleted

## 2016-10-13 LAB — RPR: RPR: NONREACTIVE

## 2016-10-13 LAB — VITAMIN B12: VITAMIN B 12: 488 pg/mL (ref 232–1245)

## 2016-10-13 LAB — COPPER, SERUM: COPPER: 119 ug/dL (ref 72–166)

## 2016-10-13 NOTE — Telephone Encounter (Signed)
Called and LVM for pt to call about results.  Okay to inform him they are unremarkable per CW,MD if he calls

## 2016-10-13 NOTE — Telephone Encounter (Signed)
-----   Message from Kathrynn Ducking, MD sent at 10/13/2016  7:40 AM EDT -----  The blood work results are unremarkable. Please call the patient.  ----- Message ----- From: Interface, [facility] Lab Results In Sent: 10/12/2016   7:42 AM To: Kathrynn Ducking, MD

## 2016-10-17 NOTE — Telephone Encounter (Signed)
Called and LVM for pt/son to call about lab results. Gave [facility] phone number.   Ok to inform pt/son labs unremarkable if they call per CW,MD

## 2016-10-18 ENCOUNTER — Encounter: Payer: Self-pay | Admitting: *Deleted

## 2016-10-18 NOTE — Telephone Encounter (Signed)
Sent pt letter about unremarkable labs per CW,MD note. Unable to reach by phone.

## 2017-02-20 ENCOUNTER — Telehealth: Payer: Self-pay | Admitting: Neurology

## 2017-02-20 NOTE — Telephone Encounter (Signed)
Events noted, the patient appears to be acting quite erratically.

## 2017-02-20 NOTE — Telephone Encounter (Signed)
Pt called wanting to speak with RN or Dr. Jannifer Franklin when asked about what pt got irritated and told me "why he had to tell me his business". Pt wanted to reschedule his appt due to his isn't sure if his ride can bring him or not. I offered the next available 3/22 also stating that I could leave a message from the RN to call him back to work him any sooner. Pt stated "no need I know how to call back" ad that he "should get off the phone before he said anything he would regret" and hung up

## 2017-02-20 NOTE — Telephone Encounter (Signed)
Pt is requesting to be referred elsewhere stating he didn't like the way our staff had treated him.

## 2017-02-20 NOTE — Telephone Encounter (Signed)
Pt just called asking that RN for Dr Jannifer Franklin calls him.  Pt was asked for his DOB then stated he did not have to provide that, his information is telephone number.  Pt was then asked what message would be about, pt then stated that he did not have to give that information and that I was not a doctor.  Pt stated to just have the RN for Dr Jannifer Franklin to call him and he then disconnected the call.

## 2017-02-20 NOTE — Telephone Encounter (Signed)
I called patient back. I offered a 1:30pm or 2:30pm on Wednesday instead. He stated he has to give transportation at least a 3 days notice. He declined. He states he is unsure if he will make appt tomorrow or not. I explained the 25ZD no show/cx policy. He states he said the lady was not sure if they could bring him. He was waiting to hear. I offered to fit him back in on another day so that he could give transportation enough notice. He also declined. He states "I will just forget the appointment tomorrow, forget that and the doctor". I advised that we would like to try and resolve this. He continued to speak over me. He states "look lady. I am 67 years old. I am probably older than you. You need to give me respect. I am your elder. I give you respect and you do the same".   I offered again to get him r/s to make sure he had transportation. He declined. Ended the call.

## 2017-02-21 ENCOUNTER — Ambulatory Visit: Payer: Medicare HMO | Admitting: Neurology

## 2017-04-27 DIAGNOSIS — E1129 Type 2 diabetes mellitus with other diabetic kidney complication: Secondary | ICD-10-CM | POA: Insufficient documentation

## 2017-04-27 DIAGNOSIS — I10 Essential (primary) hypertension: Secondary | ICD-10-CM | POA: Insufficient documentation

## 2018-02-13 DIAGNOSIS — F1011 Alcohol abuse, in remission: Secondary | ICD-10-CM | POA: Insufficient documentation

## 2018-02-13 DIAGNOSIS — E669 Obesity, unspecified: Secondary | ICD-10-CM | POA: Insufficient documentation

## 2018-02-13 DIAGNOSIS — E559 Vitamin D deficiency, unspecified: Secondary | ICD-10-CM | POA: Insufficient documentation

## 2018-02-13 DIAGNOSIS — E663 Overweight: Secondary | ICD-10-CM | POA: Insufficient documentation

## 2018-02-13 DIAGNOSIS — M545 Low back pain, unspecified: Secondary | ICD-10-CM | POA: Insufficient documentation

## 2018-02-13 DIAGNOSIS — M109 Gout, unspecified: Secondary | ICD-10-CM | POA: Insufficient documentation

## 2018-02-13 DIAGNOSIS — G8929 Other chronic pain: Secondary | ICD-10-CM | POA: Insufficient documentation

## 2018-02-21 ENCOUNTER — Ambulatory Visit: Payer: Medicare HMO | Admitting: Podiatry

## 2018-02-21 ENCOUNTER — Encounter: Payer: Self-pay | Admitting: Podiatry

## 2018-02-21 ENCOUNTER — Other Ambulatory Visit: Payer: Self-pay

## 2018-02-21 VITALS — BP 121/63 | HR 49

## 2018-02-21 DIAGNOSIS — E1142 Type 2 diabetes mellitus with diabetic polyneuropathy: Secondary | ICD-10-CM | POA: Diagnosis not present

## 2018-02-21 DIAGNOSIS — B351 Tinea unguium: Secondary | ICD-10-CM

## 2018-02-21 DIAGNOSIS — M79674 Pain in right toe(s): Secondary | ICD-10-CM | POA: Diagnosis not present

## 2018-02-21 DIAGNOSIS — M79675 Pain in left toe(s): Secondary | ICD-10-CM | POA: Diagnosis not present

## 2018-02-21 NOTE — Patient Instructions (Signed)
Onychomycosis/Fungal Toenails  WHAT IS IT? An infection that lies within the keratin of your nail plate that is caused by a fungus.  WHY ME? Fungal infections affect all ages, sexes, races, and creeds.  There may be many factors that predispose you to a fungal infection such as age, coexisting medical conditions such as diabetes, or an autoimmune disease; stress, medications, fatigue, genetics, etc.  Bottom line: fungus thrives in a warm, moist environment and your shoes offer such a location.  IS IT CONTAGIOUS? Theoretically, yes.  You do not want to share shoes, nail clippers or files with someone who has fungal toenails.  Walking around barefoot in the same room or sleeping in the same bed is unlikely to transfer the organism.  It is important to realize, however, that fungus can spread easily from one nail to the next on the same foot.  HOW DO WE TREAT THIS?  There are several ways to treat this condition.  Treatment may depend on many factors such as age, medications, pregnancy, liver and kidney conditions, etc.  It is best to ask your doctor which options are available to you.  1. No treatment.   Unlike many other medical concerns, you can live with this condition.  However for many people this can be a painful condition and may lead to ingrown toenails or a bacterial infection.  It is recommended that you keep the nails cut short to help reduce the amount of fungal nail. 2. Topical treatment.  These range from herbal remedies to prescription strength nail lacquers.  About 40-50% effective, topicals require twice daily application for approximately 9 to 12 months or until an entirely new nail has grown out.  The most effective topicals are medical grade medications available through physicians offices. 3. Oral antifungal medications.  With an 80-90% cure rate, the most common oral medication requires 3 to 4 months of therapy and stays in your system for a year as the new nail grows out.  Oral  antifungal medications do require blood work to make sure it is a safe drug for you.  A liver function panel will be performed prior to starting the medication and after the first month of treatment.  It is important to have the blood work performed to avoid any harmful side effects.  In general, this medication safe but blood work is required. 4. Laser Therapy.  This treatment is performed by applying a specialized laser to the affected nail plate.  This therapy is noninvasive, fast, and non-painful.  It is not covered by insurance and is therefore, out of pocket.  The results have been very good with a 80-95% cure rate.  [facility] is the only practice in the area to offer this therapy. Permanent Nail Avulsion.  Removing the entire nail so that a new nail will not grow back.Diabetic Neuropathy Diabetic neuropathy refers to nerve damage that is caused by diabetes (diabetes mellitus). Over time, people with diabetes can develop nerve damage throughout the body. There are several types of diabetic neuropathy:  Peripheral neuropathy. This is the most common type of diabetic neuropathy. It causes damage to nerves that carry signals between the spinal cord and other parts of the body (peripheral nerves). This usually affects nerves in the feet and legs first, and may eventually affect the hands and arms. The damage affects the ability to sense touch or temperature.  Autonomic neuropathy. This type causes damage to nerves that control involuntary functions (autonomic nerves). These nerves carry   signals that control: ? Heartbeat. ? Body temperature. ? Blood pressure. ? Urination. ? Digestion. ? Sweating. ? Sexual function. ? Response to changing blood sugar (glucose) levels.  Focal neuropathy. This type of nerve damage affects one area of the body, such as an arm, a leg, or the face. The injury may involve one nerve or a small group of nerves. Focal neuropathy can be painful and unpredictable,  and occurs most often in older adults with diabetes. This often develops suddenly, but usually improves over time and does not cause long-term problems.  Proximal neuropathy. This type of nerve damage affects the nerves of the thighs, hips, buttocks, or legs. It causes severe pain, weakness, and muscle death (atrophy), usually in the thigh muscles. It is more common among older men and people who have type 2 diabetes. The length of recovery time may vary. What are the causes? Peripheral, autonomic, and focal neuropathies are caused by diabetes that is not well controlled with treatment. The cause of proximal neuropathy is not known, but it may be caused by inflammation related to uncontrolled blood glucose levels. What are the signs or symptoms? Peripheral neuropathy Peripheral neuropathy develops slowly over time. When the nerves of the feet and legs no longer work, you may experience:  Burning, stabbing, or aching pain in the legs or feet.  Pain or cramping in the legs or feet.  Loss of feeling (numbness) and inability to feel pressure or pain in the feet. This can lead to: ? Thick calluses or sores on areas of constant pressure. ? Ulcers. ? Reduced ability to feel temperature changes.  Foot deformities.  Muscle weakness.  Loss of balance or coordination. Autonomic neuropathy The symptoms of autonomic neuropathy vary depending on which nerves are affected. Symptoms may include:  Problems with digestion, such as: ? Nausea or vomiting. ? Poor appetite. ? Bloating. ? Diarrhea or constipation. ? Trouble swallowing. ? Losing weight without trying to.  Problems with the heart, blood and lungs, such as: ? Dizziness, especially when standing up. ? Fainting. ? Shortness of breath. ? Irregular heartbeat.  Bladder problems, such as: ? Trouble starting or stopping urination. ? Leaking urine. ? Trouble emptying the bladder. ? Urinary tract infections (UTIs).  Problems with other  body functions, such as: ? Sweat. You may sweat too much or too little. ? Temperature. You might get hot easily. Or, you might feel cold more than usual. ? Sexual function. Men may not be able to get or maintain an erection. Women may have vaginal dryness and difficulty with arousal. Focal neuropathy Symptoms affect only one area of the body. Common symptoms include:  Numbness.  Tingling.  Burning pain.  Prickling feeling.  Very sensitive skin.  Weakness.  Inability to move (paralysis).  Muscle twitching.  Muscles getting smaller (wasting).  Poor coordination.  Double or blurred vision. Proximal neuropathy  Sudden, severe pain in the hip, thigh, or buttocks. Pain may spread from the back into the legs (sciatica).  Pain and numbness in the arms and legs.  Tingling.  Loss of bladder control or bowel control.  Weakness and wasting of thigh muscles.  Difficulty getting up from a seated position.  Abdominal swelling.  Unexplained weight loss. How is this diagnosed? Diagnosis usually involves reviewing your medical history and any symptoms you have. Diagnosis varies depending on the type of neuropathy your health care provider suspects. Peripheral neuropathy Your health care provider will check areas that are affected by your nervous system (neurologic exam), such as  your reflexes, how you move, and what you can feel. You may have other tests, such as:  Blood tests.  Removal and examination of fluid that surrounds the spinal cord (lumbar puncture).  CT scan.  MRI.  A test to check the nerves that control muscles (electromyogram, EMG).  Tests of how quickly messages pass through your nerves (nerve conduction velocity tests).  Removal of a small piece of nerve to be examined under a microscope (biopsy). Autonomic neuropathy You may have tests, such as:  Tests to measure your blood pressure and heart rate. This may include monitoring you while you are safely  secured to an exam table that moves you from a lying position to an upright position (table tilt test).  Breathing tests to check your lungs.  Tests to check how food moves through the digestive system (gastric emptying tests).  Blood, sweat, or urine tests.  Ultrasound of your bladder.  Spinal fluid tests. Focal neuropathy This condition may be diagnosed with:  A neurologic exam.  CT scan.  MRI.  EMG.  Nerve conduction velocity tests. Proximal neuropathy There is no test to diagnose this type of neuropathy. You may have tests to rule out other possible causes of this type of neuropathy. Tests may include:  X-rays of your spine and lumbar region.  Lumbar puncture.  MRI. How is this treated? The goal of treatment is to keep nerve damage from getting worse. The most important part of treatment is keeping your blood glucose level and your A1C level within your target range by following your diabetes management plan. Over time, maintaining lower blood glucose levels helps lessen symptoms. In some cases, you may need prescription pain medicine. Follow these instructions at home:  Lifestyle   Do not use any products that contain nicotine or tobacco, such as cigarettes and e-cigarettes. If you need help quitting, ask your health care provider.  Be physically active every day. Include strength training and balance exercises.  Follow a healthy meal plan.  Work with your health care provider to manage your blood pressure. General instructions  Follow your diabetes management plan as directed. ? Check your blood glucose levels as directed by your health care provider. ? Keep your blood glucose in your target range as directed by your health care provider. ? Have your A1C level checked at least two times a year, or as often as told by your health care provider.  Take over the counter and prescription medicines only as told by your health care provider. This includes insulin  and diabetes medicine.  Do not drive or use heavy machinery while taking prescription pain medicines.  Check your skin and feet every day for cuts, bruises, redness, blisters, or sores.  Keep all follow up visits as told by your health care provider. This is important. Contact a health care provider if:  You have burning, stabbing, or aching pain in your legs or feet.  You are unable to feel pressure or pain in your feet.  You develop problems with digestion, such as: ? Nausea. ? Vomiting. ? Bloating. ? Constipation. ? Diarrhea. ? Abdominal pain.  You have difficulty with urination, such as inability: ? To control when you urinate (incontinence). ? To completely empty the bladder (retention).  You have palpitations.  You feel dizzy, weak, or faint when you stand up. Get help right away if:  You cannot urinate.  You have sudden weakness or loss of coordination.  You have trouble speaking.  You have pain or   pressure in your chest.  You have an irregular heart beat.  You have sudden inability to move a part of your body. Summary  Diabetic neuropathy refers to nerve damage that is caused by diabetes. It can affect nerves throughout the entire body, causing numbness and pain in the arms, legs, digestive tract, heart, and other body systems.  Keep your blood glucose level and your blood pressure in your target range, as directed by your health care provider. This can help prevent neuropathy from getting worse.  Check your skin and feet every day for cuts, bruises, redness, blisters, or sores.  Do not use any products that contain nicotine or tobacco, such as cigarettes and e-cigarettes. If you need help quitting, ask your health care provider. This information is not intended to replace advice given to you by your health care provider. Make sure you discuss any questions you have with your health care provider. Document Released: 02/28/2001 Document Revised: 02/01/2017  Document Reviewed: 01/25/2016 Elsevier Interactive Patient Education  2019 [employer].

## 2018-02-21 NOTE — Progress Notes (Signed)
Subjective: Duane Ortiz presents today referred by Rogers Blocker, MD with history of diabetes and cc of painful, discolored, thick toenails which interfere with daily activities.  Pain is aggravated when wearing enclosed shoe gear. Pain is relieved with periodic professional debridement.  He has had podiatric care in the past before with Dr. Mallie Mussel in [facility].  As of late, it has become too far for him to drive to [facility], so he is seeking to receive treatment closer to his home.    He takes gabapentin for neuropathy, but feels current dosage does not completely resolve symptoms.  He is also on Xarelto.   Past Medical History:  Diagnosis Date  . Chronic kidney disease   . Chronic kidney disease   . Diabetes ([place]) 02/012017  . Fainting   . Gait abnormality 10/11/2016  . Gout   . Hypercholesteremia   . Hypercholesterolemia   . Hypertension   . Pulmonary embolism ([place])   . Shortness of breath     Patient Active Problem List   Diagnosis Date Noted  . Essential hypertension 04/27/2017  . 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 Gait abnormality 10/11/2016  . Diabetic peripheral neuropathy ([place]) 08/25/2016  . Disc degeneration, lumbar 08/25/2016  . 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 Dyspnea on exertion 05/22/2015  . Hypercholesteremia   . Gout   . Pulmonary embolism ([place])   . Chronic kidney disease   . Hypercholesterolemia     Past Surgical History:  Procedure Laterality Date  . CERVICAL DISCECTOMY    . COLONOSCOPY    . neck spine disk surgery  01/03/1993     Current Outpatient Medications:  .  allopurinol (ZYLOPRIM) 100 MG tablet, Take 100 mg by mouth daily., Disp: , Rfl:  .  amLODipine-valsartan (EXFORGE) 10-320 MG tablet, Take 1 tablet by mouth daily., Disp: , Rfl:  .  cloNIDine (CATAPRES) 0.2 MG tablet, Take 0.2 mg by mouth 2 (two) times daily., Disp: , Rfl:  .  gabapentin (NEURONTIN) 300 MG  capsule, , Disp: , Rfl:  .  HYDROcodone-acetaminophen (NORCO/VICODIN) 5-325 MG tablet, TK 1 T PO BID PRN, Disp: , Rfl:  .  hydrOXYzine (ATARAX/VISTARIL) 10 MG tablet, Take 1 tablet (10 mg total) by mouth every 8 (eight) hours as needed., Disp: 25 tablet, Rfl: 0 .  nebivolol (BYSTOLIC) 10 MG tablet, Take 10 mg by mouth daily., Disp: , Rfl:  .  pravastatin (PRAVACHOL) 40 MG tablet, Take 40 mg by mouth daily., Disp: , Rfl:  .  rivaroxaban (XARELTO) 20 MG TABS tablet, Take 20 mg by mouth every morning. , Disp: , Rfl:  .  sodium bicarbonate 650 MG tablet, Take by mouth., Disp: , Rfl:  .  spironolactone (ALDACTONE) 25 MG tablet, Take by mouth., Disp: , Rfl:  .  tamsulosin (FLOMAX) 0.4 MG CAPS capsule, Take 0.4 mg by mouth daily., Disp: , Rfl:   Allergies  Allergen Reactions  . Sulfa Antibiotics Itching    Social History   Occupational History  . Not on file  Tobacco Use  . Smoking status: Current Some Day Smoker    Packs/day: 0.25    Types: Cigarettes  . Smokeless tobacco: Never Used  . Tobacco comment: 1 packs per week  Substance and Sexual Activity  . Alcohol use: Yes    Alcohol/week: 0.0 standard drinks  . Drug use: No  . Sexual activity: Not on file    Family History  Problem  Relation Age of Onset  . Diabetes Mother   . Heart disease Mother   . Hypertension Mother   . Diabetes Sister   . Hypertension Sister   . Diabetes Brother   . Hypertension Brother   . Breast cancer Paternal Grandmother      There is no immunization history on file for this patient.   Review of systems: Positive Findings in bold print.  Constitutional:  chills, fatigue, fever, sweats, weight change Communication: Optometrist, sign Ecologist, hand writing, iPad/Android device Head: headaches, head injury Eyes: changes in vision, eye pain, glaucoma, cataracts, macular degeneration, diplopia, glare,  light sensitivity, eyeglasses or contacts, blindness Ears nose mouth throat: Hard of  hearing, ringing in ears, deaf, sign language,  vertigo,   nosebleeds,  rhinitis,  cold sores, snoring, swollen glands Cardiovascular: HTN, edema, arrhythmia, pacemaker in place, defibrillator in place,  chest pain/tightness, chronic anticoagulation, blood clot, heart failure Peripheral Vascular: leg cramps, varicose veins, blood clots, lymphedema Respiratory:  difficulty breathing, denies congestion, SOB, wheezing, cough, emphysema Gastrointestinal: change in appetite or weight, abdominal pain, constipation, diarrhea, nausea, vomiting, vomiting blood, change in bowel habits, abdominal pain, jaundice, rectal bleeding, hemorrhoids, Genitourinary:  nocturia,  pain on urination,  blood in urine, Foley catheter, urinary urgency Musculoskeletal: uses mobility aid,  cramping, stiff joints, painful joints, decreased joint motion, fractures, OA, gout, back pain Skin: +changes in toenails, color change, dryness, itching, mole changes,  rash  Neurological: headaches, numbness in feet, paresthesias in feet, burning in feet, fainting,  seizures, change in speech. denies headaches, memory problems/poor historian, cerebral palsy, weakness, paralysis Endocrine: diabetes, hypothyroidism, hyperthyroidism,  goiter, dry mouth, flushing, heat intolerance,  cold intolerance,  excessive thirst, denies polyuria,  nocturia Hematological:  easy bleeding, excessive bleeding, easy bruising, enlarged lymph nodes, on long term blood thinner, history of past transusions Allergy/immunological:  hives, eczema, frequent infections, multiple drug allergies, seasonal allergies, transplant recipient Psychiatric:  anxiety, depression, mood disorder, suicidal ideations, hallucinations   Objective: Vascular Examination: Capillary refill time immediate x 10 digits Dorsalis pedis palpable b/l Posterior tibial pulses faintly palpable b/l No digital hair x 10 digits Skin temperature gradient WNL b/l  Dermatological Examination: Skin  with normal turgor, texture and tone b/l  Toenails 1-5 b/l discolored, thick, dystrophic with subungual debris and pain with palpation to nailbeds due to thickness of nails.  Musculoskeletal: Muscle strength 5/5 to all LE muscle groups  Neurological: Sensation diminished with 10 gram monofilament Vibratory sensation diminished b/l  Assessment: 1. Painful onychomycosis toenails 1-5 b/l  2. NIDDM with neuropathy  Plan: 1. Discussed diabetic foot care principles. Literature dispensed on today. 2. Discussed neuropathy symptoms and treatment options. He would like to try compounded neuropathy cream. A prescription was sent to [facility] for peripheral neuropathy cream which consists of: Bupivacaine 1%, doxepin 3%, gabapentin 6%, pentoxifylline 3%, and Topimarate 1%. He is to apply to feet prn no more than 4 times daily. 3. Toenails 1-5 b/l were debrided in length and girth without iatrogenic bleeding. 4. Patient to continue soft, supportive shoe gear 5. Patient to report any pedal injuries to medical professional immediately. 6. Follow up 3 months.  7. Patient/POA to call should there be a concern in the interim.

## 2018-02-22 ENCOUNTER — Telehealth: Payer: Self-pay | Admitting: *Deleted

## 2018-02-22 MED ORDER — NONFORMULARY OR COMPOUNDED ITEM: 11 refills | Status: DC

## 2018-02-22 NOTE — Telephone Encounter (Signed)
Faxed Peripheral Neuropathy Cream orders to [facility].

## 2018-03-01 DIAGNOSIS — J309 Allergic rhinitis, unspecified: Secondary | ICD-10-CM | POA: Insufficient documentation

## 2018-03-01 DIAGNOSIS — R2681 Unsteadiness on feet: Secondary | ICD-10-CM | POA: Insufficient documentation

## 2018-03-19 DIAGNOSIS — I129 Hypertensive chronic kidney disease with stage 1 through stage 4 chronic kidney disease, or unspecified chronic kidney disease: Secondary | ICD-10-CM | POA: Insufficient documentation

## 2018-03-19 DIAGNOSIS — N184 Chronic kidney disease, stage 4 (severe): Secondary | ICD-10-CM | POA: Insufficient documentation

## 2018-03-31 ENCOUNTER — Emergency Department (HOSPITAL_COMMUNITY)
Admission: EM | Admit: 2018-03-31 | Discharge: 2018-03-31 | Disposition: A | Discharge status: Home / Self Care | Payer: Medicare HMO | Attending: Emergency Medicine | Admitting: Emergency Medicine

## 2018-03-31 ENCOUNTER — Other Ambulatory Visit: Payer: Self-pay

## 2018-03-31 ENCOUNTER — Emergency Department (HOSPITAL_COMMUNITY): Payer: Medicare HMO

## 2018-03-31 DIAGNOSIS — I129 Hypertensive chronic kidney disease with stage 1 through stage 4 chronic kidney disease, or unspecified chronic kidney disease: Secondary | ICD-10-CM | POA: Diagnosis not present

## 2018-03-31 DIAGNOSIS — N189 Chronic kidney disease, unspecified: Secondary | ICD-10-CM | POA: Diagnosis not present

## 2018-03-31 DIAGNOSIS — E119 Type 2 diabetes mellitus without complications: Secondary | ICD-10-CM | POA: Insufficient documentation

## 2018-03-31 DIAGNOSIS — F17210 Nicotine dependence, cigarettes, uncomplicated: Secondary | ICD-10-CM | POA: Diagnosis not present

## 2018-03-31 DIAGNOSIS — Z79899 Other long term (current) drug therapy: Secondary | ICD-10-CM | POA: Diagnosis not present

## 2018-03-31 DIAGNOSIS — R0789 Other chest pain: Secondary | ICD-10-CM | POA: Diagnosis not present

## 2018-03-31 LAB — CBC WITH DIFFERENTIAL/PLATELET
Abs Immature Granulocytes: 0.04 10*3/uL (ref 0.00–0.07)
Basophils Absolute: 0 10*3/uL (ref 0.0–0.1)
Basophils Relative: 0 %
Eosinophils Absolute: 0.3 10*3/uL (ref 0.0–0.5)
Eosinophils Relative: 5 %
HCT: 30.4 % — ABNORMAL LOW (ref 39.0–52.0)
HEMOGLOBIN: 9.3 g/dL — AB (ref 13.0–17.0)
Immature Granulocytes: 1 %
Lymphocytes Relative: 18 %
Lymphs Abs: 1.3 10*3/uL (ref 0.7–4.0)
MCH: 27.7 pg (ref 26.0–34.0)
MCHC: 30.6 g/dL (ref 30.0–36.0)
MCV: 90.5 fL (ref 80.0–100.0)
MONO ABS: 0.8 10*3/uL (ref 0.1–1.0)
MONOS PCT: 12 %
Neutro Abs: 4.3 10*3/uL (ref 1.7–7.7)
Neutrophils Relative %: 64 %
Platelets: 223 10*3/uL (ref 150–400)
RBC: 3.36 MIL/uL — ABNORMAL LOW (ref 4.22–5.81)
RDW: 12.4 % (ref 11.5–15.5)
WBC: 6.8 10*3/uL (ref 4.0–10.5)
nRBC: 0 % (ref 0.0–0.2)

## 2018-03-31 LAB — POCT I-STAT EG7
Acid-base deficit: 2 mmol/L (ref 0.0–2.0)
Bicarbonate: 21.8 mmol/L (ref 20.0–28.0)
Calcium, Ion: 1.12 mmol/L — ABNORMAL LOW (ref 1.15–1.40)
HCT: 28 % — ABNORMAL LOW (ref 39.0–52.0)
Hemoglobin: 9.5 g/dL — ABNORMAL LOW (ref 13.0–17.0)
O2 Saturation: 95 %
POTASSIUM: 4.4 mmol/L (ref 3.5–5.1)
Sodium: 139 mmol/L (ref 135–145)
TCO2: 23 mmol/L (ref 22–32)
pCO2, Ven: 34.5 mmHg — ABNORMAL LOW (ref 44.0–60.0)
pH, Ven: 7.409 (ref 7.250–7.430)
pO2, Ven: 75 mmHg — ABNORMAL HIGH (ref 32.0–45.0)

## 2018-03-31 LAB — BASIC METABOLIC PANEL
ANION GAP: 11 (ref 5–15)
BUN: 36 mg/dL — ABNORMAL HIGH (ref 8–23)
CHLORIDE: 106 mmol/L (ref 98–111)
CO2: 19 mmol/L — ABNORMAL LOW (ref 22–32)
CREATININE: 2.86 mg/dL — AB (ref 0.61–1.24)
Calcium: 8.9 mg/dL (ref 8.9–10.3)
GFR calc non Af Amer: 21 mL/min — ABNORMAL LOW (ref >60)
GFR, EST AFRICAN AMERICAN: 25 mL/min — AB (ref >60)
Glucose, Bld: 108 mg/dL — ABNORMAL HIGH (ref 70–99)
Potassium: 4.3 mmol/L (ref 3.5–5.1)
SODIUM: 136 mmol/L (ref 135–145)

## 2018-03-31 LAB — D-DIMER, QUANTITATIVE: D-Dimer, Quant: 0.31 ug/mL-FEU (ref 0.00–0.50)

## 2018-03-31 LAB — TROPONIN I: Troponin I: <0.03 ng/mL (ref <0.03)

## 2018-03-31 LAB — I-STAT CREATININE, ED: Creatinine, Ser: 3.1 mg/dL — ABNORMAL HIGH (ref 0.61–1.24)

## 2018-03-31 LAB — CBG MONITORING, ED: Glucose-Capillary: 98 mg/dL (ref 70–99)

## 2018-03-31 IMAGING — DX CHEST - 2 VIEW
2 series · 2 of 2 positions shown · non-contrast
Comparison: None.

CLINICAL DATA: Chest pain.

EXAM:
CHEST - 2 VIEW

[chest ap]
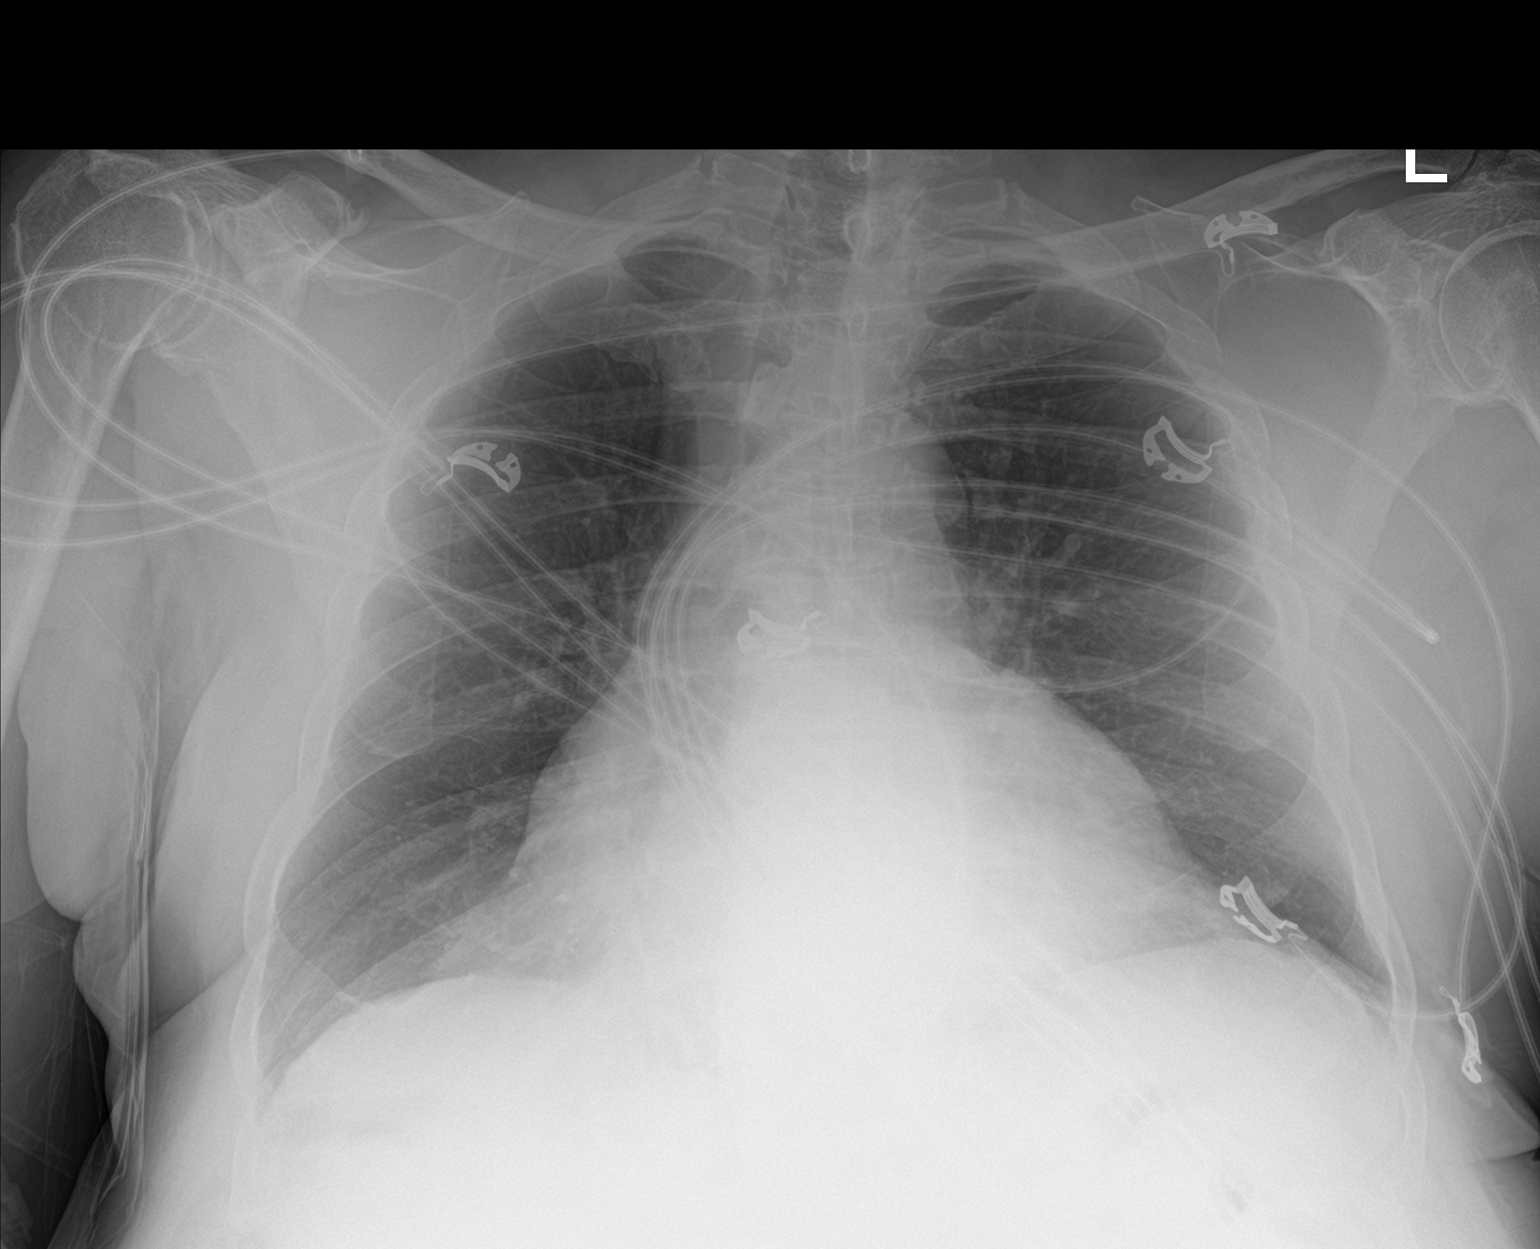

[chest lat]
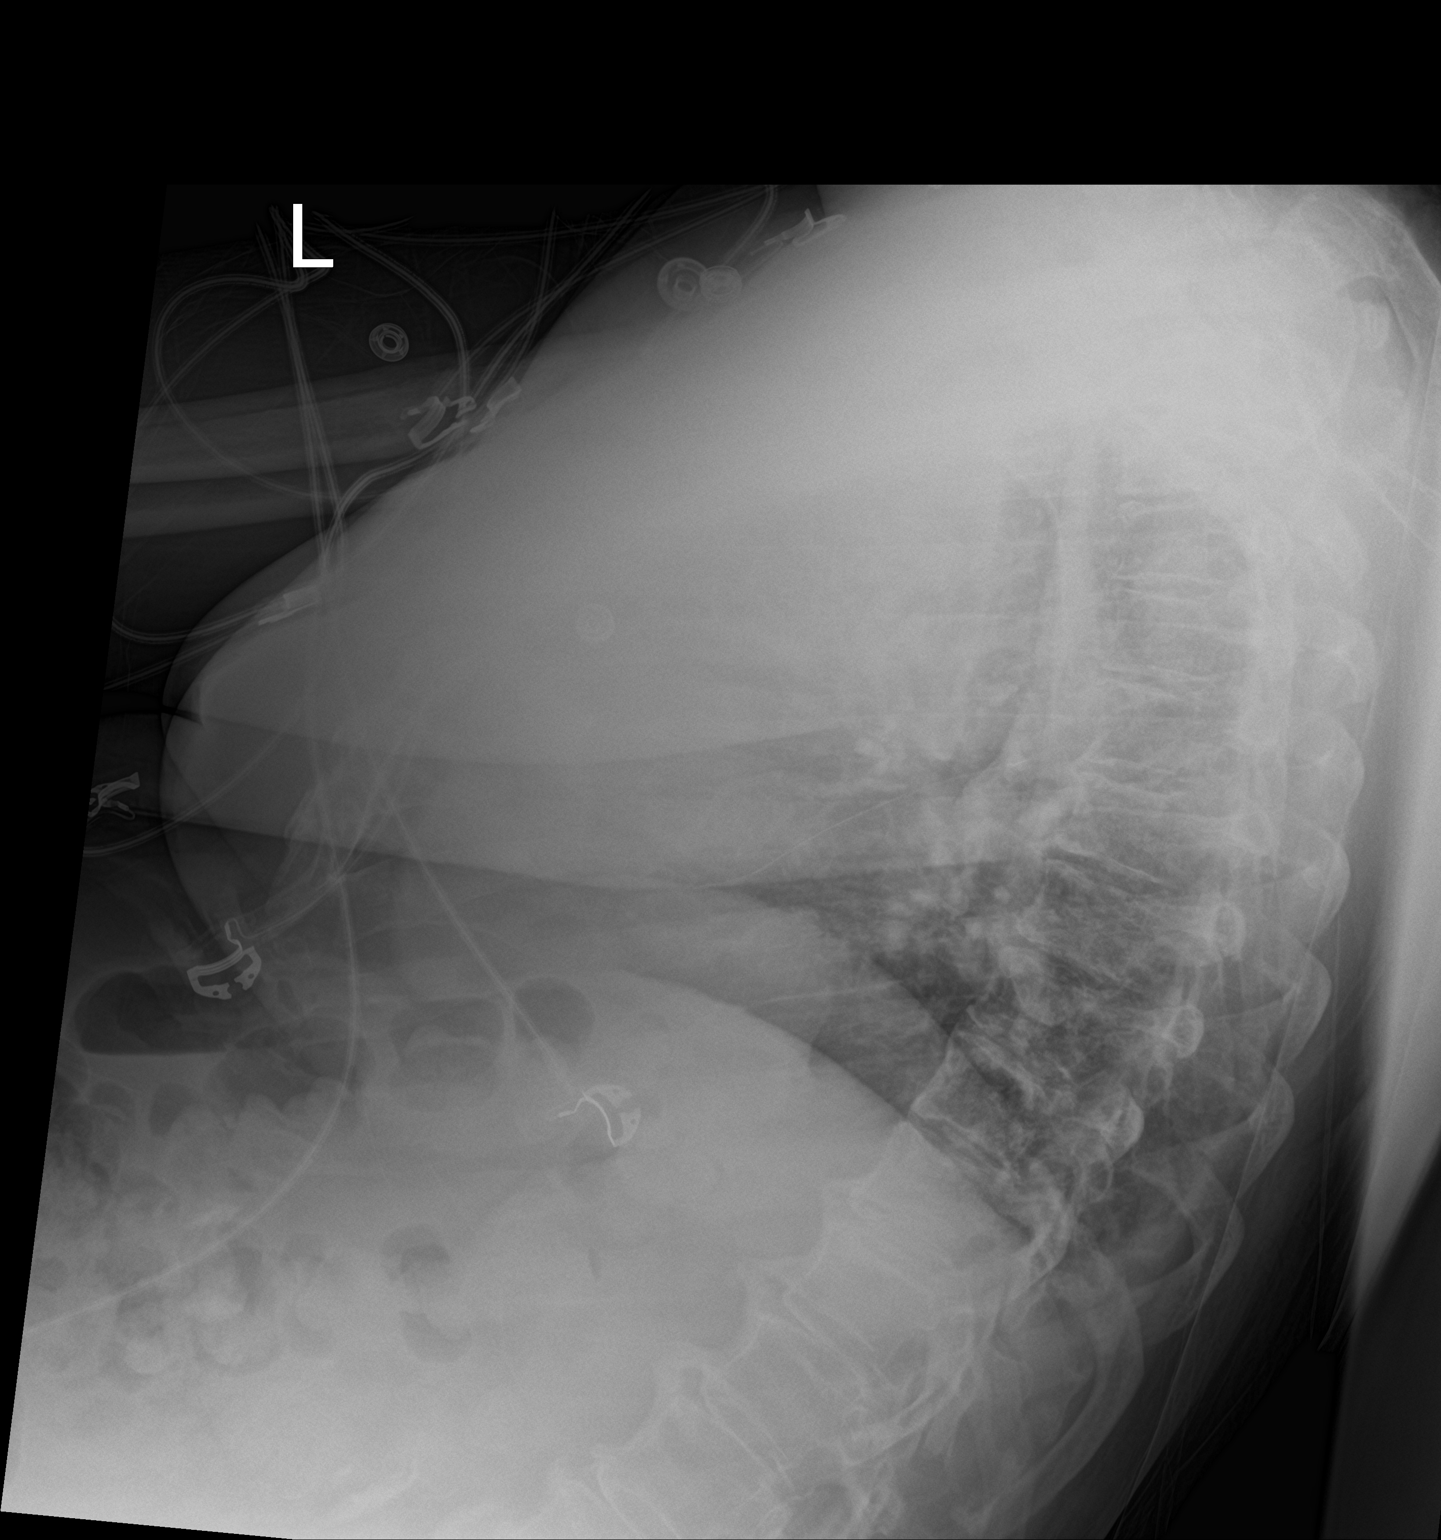

[2 of 2 positions shown; findings below may reference images not displayed]

FINDINGS: Mild cardiomegaly is noted. No pneumothorax or pleural effusion is
noted. Both lungs are clear. The visualized skeletal structures are
unremarkable.
IMPRESSION: No active cardiopulmonary disease.

## 2018-03-31 MED ORDER — ACETAMINOPHEN 325 MG PO TABS
650.0000 mg | ORAL_TABLET | Freq: Once | ORAL | Status: AC
  Administered 2018-03-31: 650 mg via ORAL
  Filled 2018-03-31: qty 2

## 2018-03-31 NOTE — Discharge Instructions (Addendum)
The pain in your chest is likely related to a muscle cramp or strain.  Use heat on the sore area 3 or 4 times a day, and use Tylenol for pain.  Follow-up with your primary care doctor if not better in 1 week and as needed for problems.  Watch for signs of developing viral illness including fever, weakness, dizziness, shortness of breath, cough.  If you develop these symptoms, quarantine yourself away from people and seek medical care by phone or in person if needed for serious illness.

## 2018-03-31 NOTE — ED Triage Notes (Signed)
Pt reports that he has had right sided chest pain going on since Wednesday. Pt reports that the pain radiates to his right shoulder. Pt denies any injury to the area. Reports that the pain worsens with palpation. Pt reports shortness of breath with exertion, denies cough or fevers at home. Pt reports he had a previous PE and this feels like it did that time.

## 2018-03-31 NOTE — ED Provider Notes (Signed)
[facility] EMERGENCY DEPARTMENT Provider Note   CSN: [id number] Arrival date & time: 03/31/18  6834    History   Chief Complaint Chief Complaint  Patient presents with  . Back Pain  . Chest Pain    HPI Duane Ortiz is a 68 y.o. male.     HPI   The patient presents for evaluation of right anterior chest pain radiating to the right shoulder, and right posterior upper back, for 1 week.  He feels like there is a "pulled muscle.".  He is also concerned about another PE because he felt like this previously when he had a PE.  He continues to take Xarelto as prescribed.  He does not know of a specific trauma.  He feels mild shortness of breath with exertion.  His chest discomfort is mild.  He denies fever, chills, sneezing, cough, nausea, vomiting, weakness or dizziness.  He states his legs have been swelling since he "ate some Oodles of Noodles."  No known sick contacts or exposure to Covid-19.  There are no other known modifying factors.  Past Medical History:  Diagnosis Date  . Chronic kidney disease   . Chronic kidney disease   . Diabetes ([place]) 02/012017  . Fainting   . Gait abnormality 10/11/2016  . Gout   . Hypercholesteremia   . Hypercholesterolemia   . Hypertension   . Pulmonary embolism ([place])   . Shortness of breath     Patient Active Problem List   Diagnosis Date Noted  . Essential hypertension 04/27/2017  . 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 Gait abnormality 10/11/2016  . Diabetic peripheral neuropathy ([place]) 08/25/2016  . Disc degeneration, lumbar 08/25/2016  . 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 Dyspnea on exertion 05/22/2015  . Hypercholesteremia   . Gout   . Pulmonary embolism ([place])   . Chronic kidney disease   . Hypercholesterolemia     Past Surgical History:  Procedure Laterality Date  . CERVICAL DISCECTOMY    . COLONOSCOPY    . neck spine disk surgery   01/03/1993        Home Medications    Prior to Admission medications   Medication Sig Start Date End Date Taking? Authorizing Provider  allopurinol (ZYLOPRIM) 100 MG tablet Take 100 mg by mouth daily.   Yes [provider]  amLODipine-valsartan (EXFORGE) 10-320 MG tablet Take 1 tablet by mouth daily.   Yes [provider]  cloNIDine (CATAPRES) 0.2 MG tablet Take 0.2 mg by mouth 2 (two) times daily.   Yes [provider]  gabapentin (NEURONTIN) 300 MG capsule Take 300 mg by mouth 2 (two) times daily.  12/18/17  Yes [provider]  HYDROcodone-acetaminophen (NORCO/VICODIN) 5-325 MG tablet Take 1 tablet by mouth every 6 (six) hours as needed for moderate pain.  01/30/18  Yes [provider]  hydrOXYzine (ATARAX/VISTARIL) 10 MG tablet Take 1 tablet (10 mg total) by mouth every 8 (eight) hours as needed. Patient taking differently: Take 10 mg by mouth every 8 (eight) hours as needed for anxiety.  02/17/16  Yes Carmin Muskrat, MD  nebivolol (BYSTOLIC) 10 MG tablet Take 10 mg by mouth daily.   Yes [provider]  pravastatin (PRAVACHOL) 40 MG tablet Take 40 mg by mouth daily.   Yes [provider]  rivaroxaban (XARELTO) 20 MG TABS tablet Take 20 mg by mouth every morning.    Yes [provider]  sodium bicarbonate  650 MG tablet Take 1,300 mg by mouth daily.    Yes [provider]  spironolactone (ALDACTONE) 25 MG tablet Take 25 mg by mouth 2 (two) times daily as needed (for fluid).  08/12/16  Yes [provider]  tamsulosin (FLOMAX) 0.4 MG CAPS capsule Take 0.4 mg by mouth daily.   Yes [provider]  NONFORMULARY OR COMPOUNDED ITEM [facility]:  Peripheral Neuropathy Cream - Bupivacaine 1%, Doxepin 3%, Gabapentin 6%, Pentoxifylline 3%, Topiramate 1%. Apply 1-2 grams to affected area 3-4 times a day. Patient not taking: Reported on 03/31/2018 02/22/18   Marzetta Board, DPM    Family  History Family History  Problem Relation Age of Onset  . Diabetes Mother   . Heart disease Mother   . Hypertension Mother   . Diabetes Sister   . Hypertension Sister   . Diabetes Brother   . Hypertension Brother   . Breast cancer Paternal Grandmother     Social History Social History   Tobacco Use  . Smoking status: Current Some Day Smoker    Packs/day: 0.25    Types: Cigarettes  . Smokeless tobacco: Never Used  . Tobacco comment: 1 packs per week  Substance Use Topics  . Alcohol use: Yes    Alcohol/week: 0.0 standard drinks  . Drug use: No     Allergies   Sulfa antibiotics   Review of Systems Review of Systems  All other systems reviewed and are negative.    Physical Exam Updated Vital Signs BP (!) 166/84   Pulse (!) 55   Temp 98.1 F (36.7 C) (Oral)   Resp 14   Ht 5\' 5"  (1.651 m)   Wt 107.5 kg   SpO2 99%   BMI 39.44 kg/m   Physical Exam Vitals signs and nursing note reviewed.  Constitutional:      General: He is not in acute distress.    Appearance: He is well-developed. He is obese. He is not ill-appearing, toxic-appearing or diaphoretic.  HENT:     Head: Normocephalic and atraumatic.     Right Ear: External ear normal.     Left Ear: External ear normal.  Eyes:     Conjunctiva/sclera: Conjunctivae normal.     Pupils: Pupils are equal, round, and reactive to light.  Neck:     Musculoskeletal: Normal range of motion and neck supple.     Trachea: Phonation normal.  Cardiovascular:     Rate and Rhythm: Normal rate and regular rhythm.     Heart sounds: Normal heart sounds.  Pulmonary:     Effort: Pulmonary effort is normal. No respiratory distress.     Breath sounds: Normal breath sounds. No stridor. No wheezing or rhonchi.  Chest:     Chest wall: No tenderness.  Abdominal:     General: There is distension.     Palpations: Abdomen is soft. There is no mass.     Tenderness: There is no abdominal tenderness.     Hernia: No hernia is present.   Musculoskeletal: Normal range of motion.     Right lower leg: Edema present.     Left lower leg: Edema present.     Comments: 3+ lower leg edema.  Skin:    General: Skin is warm and dry.  Neurological:     Mental Status: He is alert and oriented to person, place, and time.     Cranial Nerves: No cranial nerve deficit.     Sensory: No sensory deficit.  Motor: No abnormal muscle tone.     Coordination: Coordination normal.  Psychiatric:        Mood and Affect: Mood normal.        Behavior: Behavior normal.        Thought Content: Thought content normal.        Judgment: Judgment normal.      ED Treatments / Results  Labs (all labs ordered are listed, but only abnormal results are displayed) Labs Reviewed  BASIC METABOLIC PANEL - Abnormal; Notable for the following components:      Result Value   CO2 19 (*)    Glucose, Bld 108 (*)    BUN 36 (*)    Creatinine, Ser 2.86 (*)    GFR calc non Af Amer 21 (*)    GFR calc Af Amer 25 (*)    All other components within normal limits  CBC WITH DIFFERENTIAL/PLATELET - Abnormal; Notable for the following components:   RBC 3.36 (*)    Hemoglobin 9.3 (*)    HCT 30.4 (*)    All other components within normal limits  I-STAT CREATININE, ED - Abnormal; Notable for the following components:   Creatinine, Ser 3.10 (*)    All other components within normal limits  POCT I-STAT EG7 - Abnormal; Notable for the following components:   pCO2, Ven 34.5 (*)    pO2, Ven 75.0 (*)    Calcium, Ion 1.12 (*)    HCT 28.0 (*)    Hemoglobin 9.5 (*)    All other components within normal limits  TROPONIN I  D-DIMER, QUANTITATIVE (NOT AT [facility])  CBG MONITORING, ED    EKG EKG Interpretation  Date/Time:  Saturday March 31 2018 08:40:12 EDT Ventricular Rate:  53 PR Interval:    QRS Duration: 82 QT Interval:  412 QTC Calculation: 387 R Axis:   13 Text Interpretation:  Sinus rhythm Prolonged PR interval Abnormal R-wave progression, early transition  Borderline T abnormalities, diffuse leads since 02/16/2016 No significant change was found Confirmed by Daleen Bo [phone]) on 03/31/2018 8:46:44 AM   Radiology Dg Chest 2 View  Result Date: 03/31/2018 CLINICAL DATA:  Chest pain. EXAM: CHEST - 2 VIEW COMPARISON:  None. FINDINGS: Mild cardiomegaly is noted. No pneumothorax or pleural effusion is noted. Both lungs are clear. The visualized skeletal structures are unremarkable. IMPRESSION: No active cardiopulmonary disease. Electronically Signed   By: Marijo Conception, M.D.   On: 03/31/2018 10:02    Procedures Procedures (including critical care time)  Medications Ordered in ED Medications  acetaminophen (TYLENOL) tablet 650 mg (650 mg Oral Given 03/31/18 0913)     Initial Impression / Assessment and Plan / ED Course  I have reviewed the triage vital signs and the nursing notes.  Pertinent labs & imaging results that were available during my care of the patient were reviewed by me and considered in my medical decision making (see chart for details).         Patient Vitals for the past 24 hrs:  BP Temp Temp src Pulse Resp SpO2 Height Weight  03/31/18 1015 (!) 166/84 - - (!) 55 14 99 % - -  03/31/18 1000 (!) 151/76 - - (!) 49 - 100 % - -  03/31/18 0945 (!) 155/81 - - (!) 50 - 100 % - -  03/31/18 0915 (!) 123/95 - - (!) 57 13 100 % - -  03/31/18 0845 (!) 143/78 - - (!) 57 16 100 % - -  03/31/18  6812 (!) 155/82 98.1 F (36.7 C) Oral (!) 57 14 100 % - -  03/31/18 0825 - - - - - - 5\' 5"  (1.651 m) 107.5 kg    10:36 AM Reevaluation with update and discussion. After initial assessment and treatment, an updated evaluation reveals he is comfortable at this time.  No additional complaints at this time.  Findings discussed with the patient and all questions were answered. Daleen Bo   Medical Decision Making: Nonspecific chest and right shoulder pain.  Suspect skeletal pain.  Doubt ACS, PE, pneumonia, aortic dissection, has bacterial  infection or metabolic instability.  Reyli Schroth was evaluated in Emergency Department on 03/31/2018 for the symptoms described in the history of present illness. He was evaluated in the context of the global COVID-19 pandemic, which necessitated consideration that the patient might be at risk for infection with the SARS-CoV-2 virus that causes COVID-19. Institutional protocols and algorithms that pertain to the evaluation of patients at risk for COVID-19 are in a state of rapid change based on information released by regulatory bodies including the CDC and federal and state organizations. These policies and algorithms were followed during the patient's care in the ED.  CRITICAL CARE-no Performed by: Daleen Bo  Nursing Notes Reviewed/ Care Coordinated Applicable Imaging Reviewed Interpretation of Laboratory Data incorporated into ED treatment  The patient appears reasonably screened and/or stabilized for discharge and I doubt any other medical condition or other [facility] requiring further screening, evaluation, or treatment in the ED at this time prior to discharge.  Plan: Home Medications-continue usual medicine and use Tylenol as needed for pain; Home Treatments-use heat on affected area; return here if the recommended treatment, does not improve the symptoms; Recommended follow up-PCP follow-up PRN   Final Clinical Impressions(s) / ED Diagnoses   Final diagnoses:  Chest wall pain    ED Discharge Orders    None       Daleen Bo, MD 03/31/18 1039

## 2018-04-24 DIAGNOSIS — K219 Gastro-esophageal reflux disease without esophagitis: Secondary | ICD-10-CM | POA: Insufficient documentation

## 2018-05-23 ENCOUNTER — Ambulatory Visit: Payer: Medicare HMO | Admitting: Podiatry

## 2018-05-23 ENCOUNTER — Other Ambulatory Visit: Payer: Self-pay

## 2018-05-23 ENCOUNTER — Encounter: Payer: Self-pay | Admitting: Podiatry

## 2018-05-23 VITALS — Temp 96.6°F

## 2018-05-23 DIAGNOSIS — M79674 Pain in right toe(s): Secondary | ICD-10-CM | POA: Diagnosis not present

## 2018-05-23 DIAGNOSIS — M79675 Pain in left toe(s): Secondary | ICD-10-CM

## 2018-05-23 DIAGNOSIS — B351 Tinea unguium: Secondary | ICD-10-CM

## 2018-05-28 ENCOUNTER — Encounter (HOSPITAL_COMMUNITY): Payer: Self-pay

## 2018-05-28 ENCOUNTER — Other Ambulatory Visit: Payer: Self-pay

## 2018-05-28 ENCOUNTER — Emergency Department (HOSPITAL_COMMUNITY): Payer: Medicare HMO

## 2018-05-28 ENCOUNTER — Emergency Department (HOSPITAL_COMMUNITY)
Admission: EM | Admit: 2018-05-28 | Discharge: 2018-05-28 | Disposition: A | Discharge status: Home / Self Care | Payer: Medicare HMO | Attending: Emergency Medicine | Admitting: Emergency Medicine

## 2018-05-28 DIAGNOSIS — E1140 Type 2 diabetes mellitus with diabetic neuropathy, unspecified: Secondary | ICD-10-CM | POA: Insufficient documentation

## 2018-05-28 DIAGNOSIS — Z7901 Long term (current) use of anticoagulants: Secondary | ICD-10-CM | POA: Diagnosis not present

## 2018-05-28 DIAGNOSIS — E1122 Type 2 diabetes mellitus with diabetic chronic kidney disease: Secondary | ICD-10-CM | POA: Diagnosis not present

## 2018-05-28 DIAGNOSIS — N189 Chronic kidney disease, unspecified: Secondary | ICD-10-CM | POA: Diagnosis not present

## 2018-05-28 DIAGNOSIS — E875 Hyperkalemia: Secondary | ICD-10-CM | POA: Diagnosis not present

## 2018-05-28 DIAGNOSIS — R109 Unspecified abdominal pain: Secondary | ICD-10-CM | POA: Diagnosis not present

## 2018-05-28 DIAGNOSIS — I129 Hypertensive chronic kidney disease with stage 1 through stage 4 chronic kidney disease, or unspecified chronic kidney disease: Secondary | ICD-10-CM | POA: Insufficient documentation

## 2018-05-28 DIAGNOSIS — M5489 Other dorsalgia: Secondary | ICD-10-CM | POA: Diagnosis present

## 2018-05-28 DIAGNOSIS — Z79899 Other long term (current) drug therapy: Secondary | ICD-10-CM | POA: Insufficient documentation

## 2018-05-28 DIAGNOSIS — M545 Low back pain, unspecified: Secondary | ICD-10-CM

## 2018-05-28 DIAGNOSIS — F17210 Nicotine dependence, cigarettes, uncomplicated: Secondary | ICD-10-CM | POA: Insufficient documentation

## 2018-05-28 LAB — COMPREHENSIVE METABOLIC PANEL
ALT: 13 U/L (ref 0–44)
AST: 21 U/L (ref 15–41)
Albumin: 3 g/dL — ABNORMAL LOW (ref 3.5–5.0)
Alkaline Phosphatase: 63 U/L (ref 38–126)
Anion gap: 10 (ref 5–15)
BUN: 34 mg/dL — ABNORMAL HIGH (ref 8–23)
CO2: 20 mmol/L — ABNORMAL LOW (ref 22–32)
Calcium: 9.2 mg/dL (ref 8.9–10.3)
Chloride: 108 mmol/L (ref 98–111)
Creatinine, Ser: 3.12 mg/dL — ABNORMAL HIGH (ref 0.61–1.24)
GFR calc Af Amer: 22 mL/min — ABNORMAL LOW (ref >60)
GFR calc non Af Amer: 19 mL/min — ABNORMAL LOW (ref >60)
Glucose, Bld: 114 mg/dL — ABNORMAL HIGH (ref 70–99)
Potassium: 6.1 mmol/L — ABNORMAL HIGH (ref 3.5–5.1)
Sodium: 138 mmol/L (ref 135–145)
Total Bilirubin: 0.7 mg/dL (ref 0.3–1.2)
Total Protein: 6.3 g/dL — ABNORMAL LOW (ref 6.5–8.1)

## 2018-05-28 LAB — TROPONIN I: Troponin I: <0.03 ng/mL (ref <0.03)

## 2018-05-28 LAB — CBC WITH DIFFERENTIAL/PLATELET
Abs Immature Granulocytes: 0.03 10*3/uL (ref 0.00–0.07)
Basophils Absolute: 0 10*3/uL (ref 0.0–0.1)
Basophils Relative: 0 %
Eosinophils Absolute: 0.3 10*3/uL (ref 0.0–0.5)
Eosinophils Relative: 4 %
HCT: 32.1 % — ABNORMAL LOW (ref 39.0–52.0)
Hemoglobin: 9.9 g/dL — ABNORMAL LOW (ref 13.0–17.0)
Immature Granulocytes: 0 %
Lymphocytes Relative: 20 %
Lymphs Abs: 1.4 10*3/uL (ref 0.7–4.0)
MCH: 28 pg (ref 26.0–34.0)
MCHC: 30.8 g/dL (ref 30.0–36.0)
MCV: 90.7 fL (ref 80.0–100.0)
Monocytes Absolute: 0.6 10*3/uL (ref 0.1–1.0)
Monocytes Relative: 9 %
Neutro Abs: 4.7 10*3/uL (ref 1.7–7.7)
Neutrophils Relative %: 67 %
Platelets: 267 10*3/uL (ref 150–400)
RBC: 3.54 MIL/uL — ABNORMAL LOW (ref 4.22–5.81)
RDW: 12.6 % (ref 11.5–15.5)
WBC: 7.1 10*3/uL (ref 4.0–10.5)
nRBC: 0 % (ref 0.0–0.2)

## 2018-05-28 LAB — URINALYSIS, ROUTINE W REFLEX MICROSCOPIC
Bilirubin Urine: NEGATIVE
Glucose, UA: NEGATIVE mg/dL
Hgb urine dipstick: NEGATIVE
Ketones, ur: NEGATIVE mg/dL
Leukocytes,Ua: NEGATIVE
Nitrite: NEGATIVE
Protein, ur: 100 mg/dL — AB
Specific Gravity, Urine: 1.01 (ref 1.005–1.030)
pH: 6 (ref 5.0–8.0)

## 2018-05-28 IMAGING — CT CT LUMBAR SPINE WITHOUT CONTRAST
4 of 7 series · 12 of 33 positions shown, 13 images · non-contrast
Comparison: MR [DATE]

CLINICAL DATA: Pt was in an elevator that fell a couple stories
then stopped quickly he has had Low back pain since that time It has
gotten worse in past week

EXAM:
CT LUMBAR SPINE WITHOUT CONTRAST
TECHNIQUE: Multidetector CT imaging of the lumbar spine was performed without
intravenous contrast administration. Multiplanar CT image
reconstructions were also generated.

[Series 8: l spine st · axial · 0.30mm/px · z∈[-412,-250]mm · 4 of 130 slices shown, 5 images]
[im 26/130  soft-tissue]
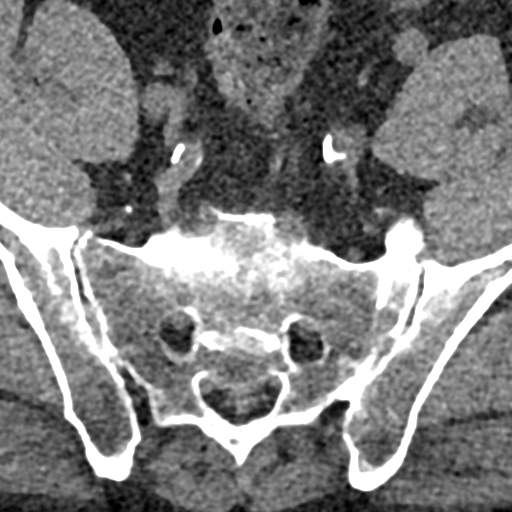
[im 26/130  bone]
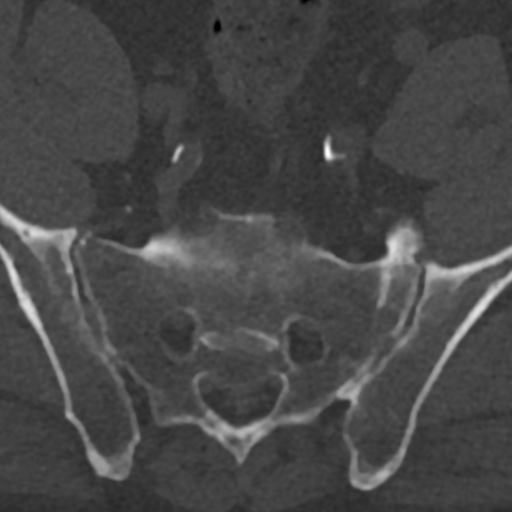
[im 52/130  bone]
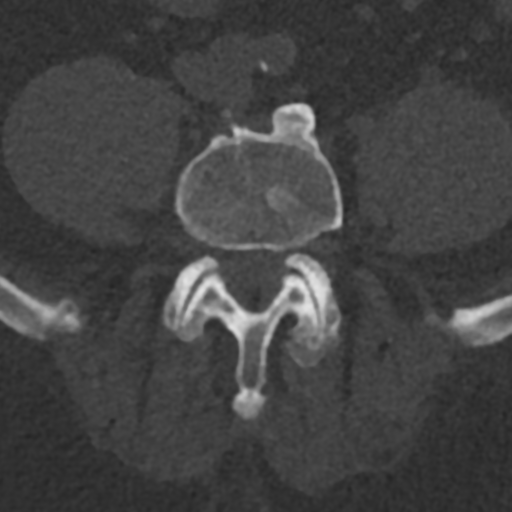
[im 78/130  bone]
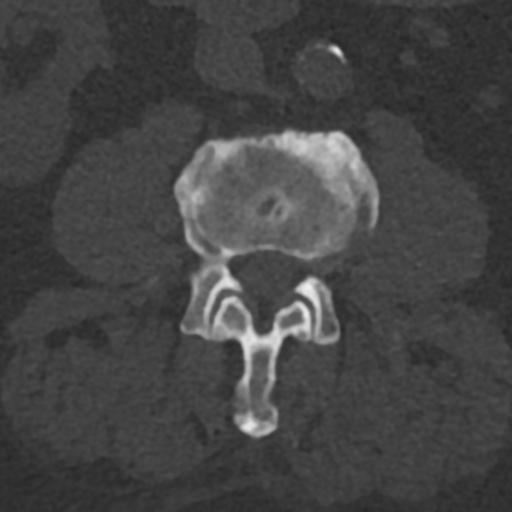
[im 104/130  bone]
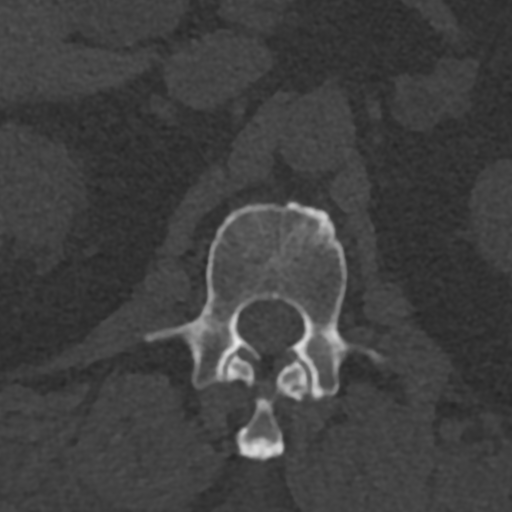

[Series 9: l spine 2mm bone · axial · 0.38mm/px · 1 of 97 slices shown]
[im 33/97  bone]
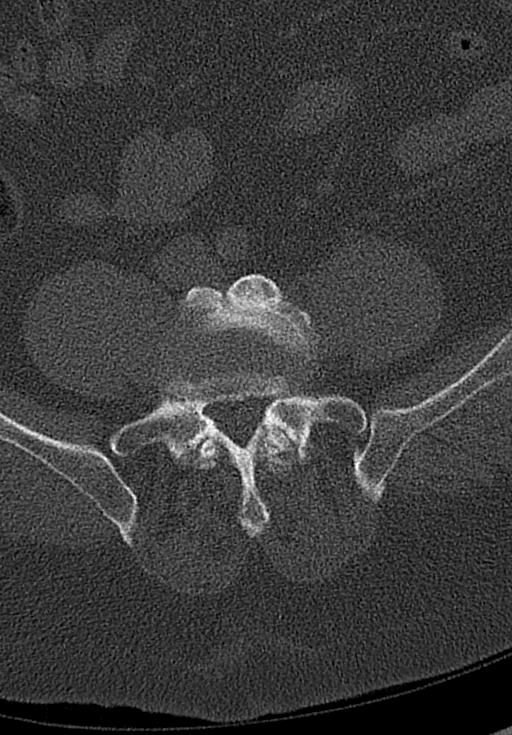

[Series 11: l spine cor bone · coronal · 0.31mm/px · 2 of 111 slices shown]
[im 37/111  bone]
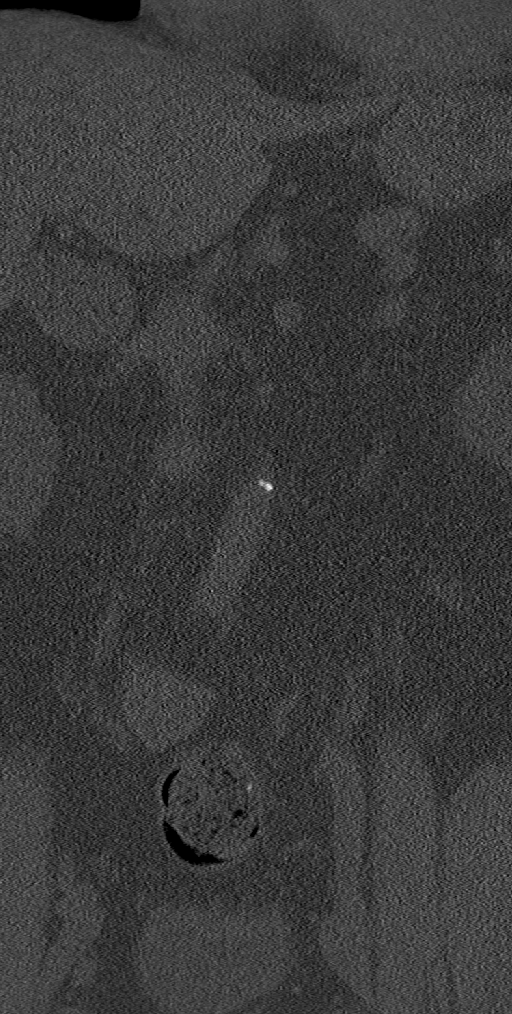
[im 74/111  bone]
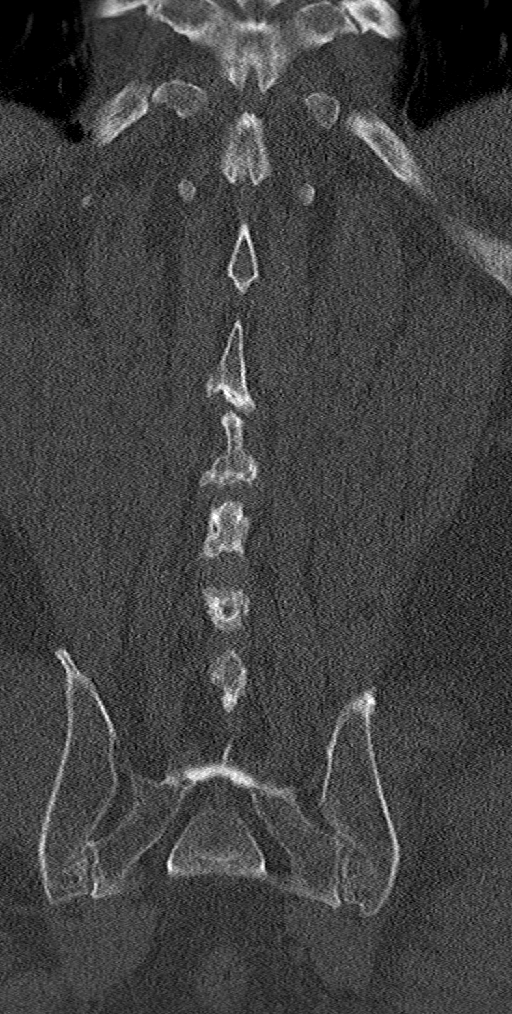

[Series 14: l spine st sag · sagittal · 0.30mm/px · 5 of 75 slices shown]
[im 13/75  bone]
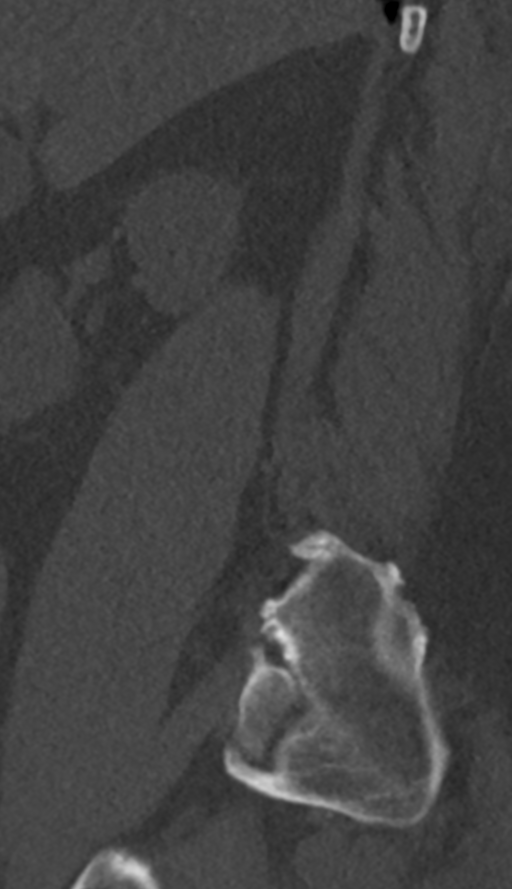
[im 25/75  bone]
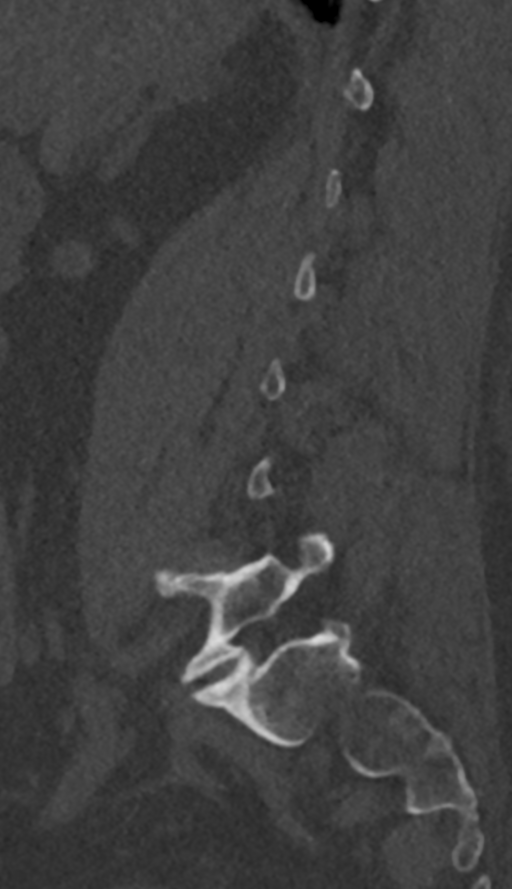
[im 38/75  bone]
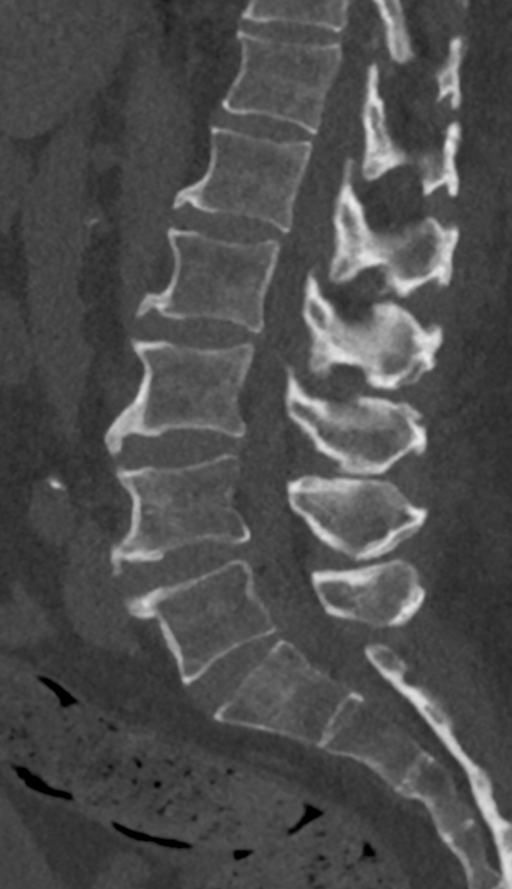
[im 50/75  bone]
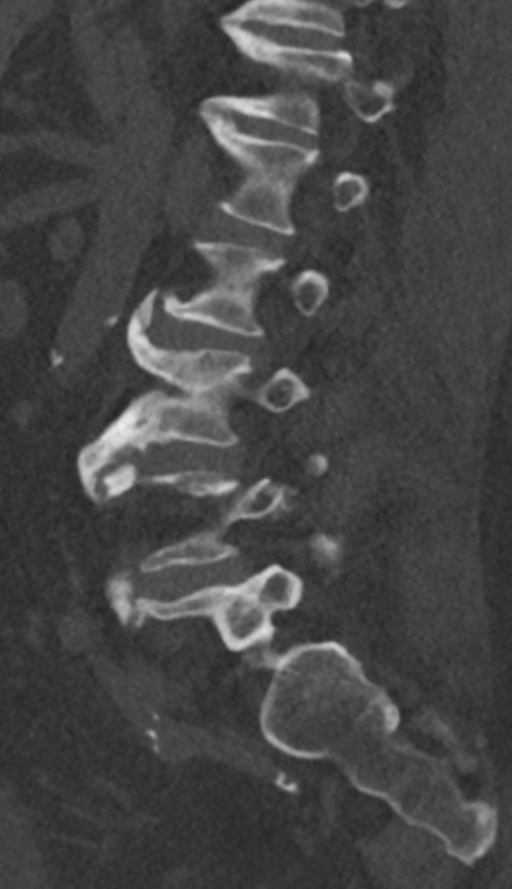
[im 62/75  bone]
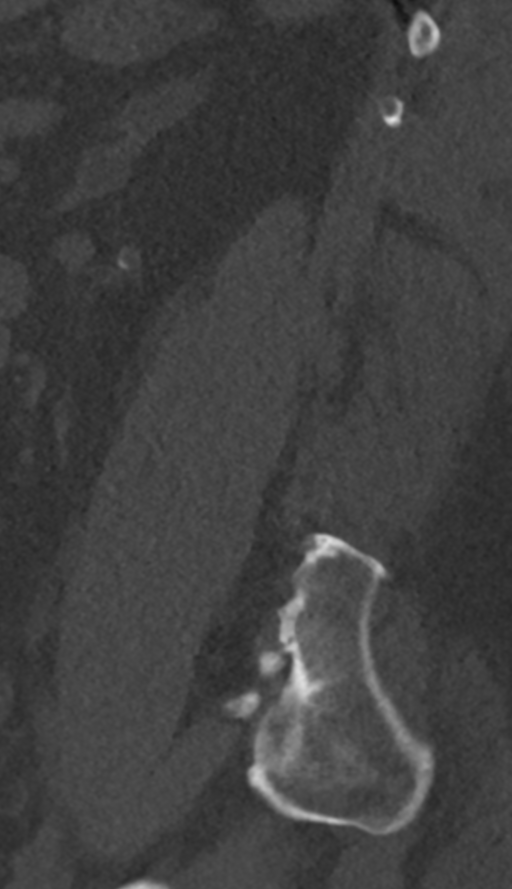

[12 of 33 positions shown; findings below may reference images not displayed]

FINDINGS: Segmentation: Numbering scheme follows that established on previous
MRI, the lowest open interspace L5-S1. 5 non rib-bearing lumbar
segments labeled L1-L5. Hypoplastic ribs at T12. L5 is a
transitional segment.

Alignment: Normal

Vertebrae: Negative for fracture. Anterior endplate spurring at all
levels T10-S1, most exuberant L2-L5. facet DJD L4-S1 bilaterally.

Paraspinal and other soft tissues: No evidence of paraspinal or
epidural hematoma. Aortic Atherosclerosis ([W9]-170.0).

Disc levels: Well maintained throughout.
IMPRESSION: 1. Negative for lumbar fracture or other acute finding.
2. Multilevel spondylitic changes as above.

## 2018-05-28 IMAGING — DX PORTABLE CHEST - 1 VIEW
1 series · 1 of 1 positions shown · non-contrast
Comparison: Chest x-ray dated [DATE].

CLINICAL DATA: Chest pain and generalized back pain for several
weeks. No known trauma.

EXAM:
PORTABLE CHEST 1 VIEW

[chest]
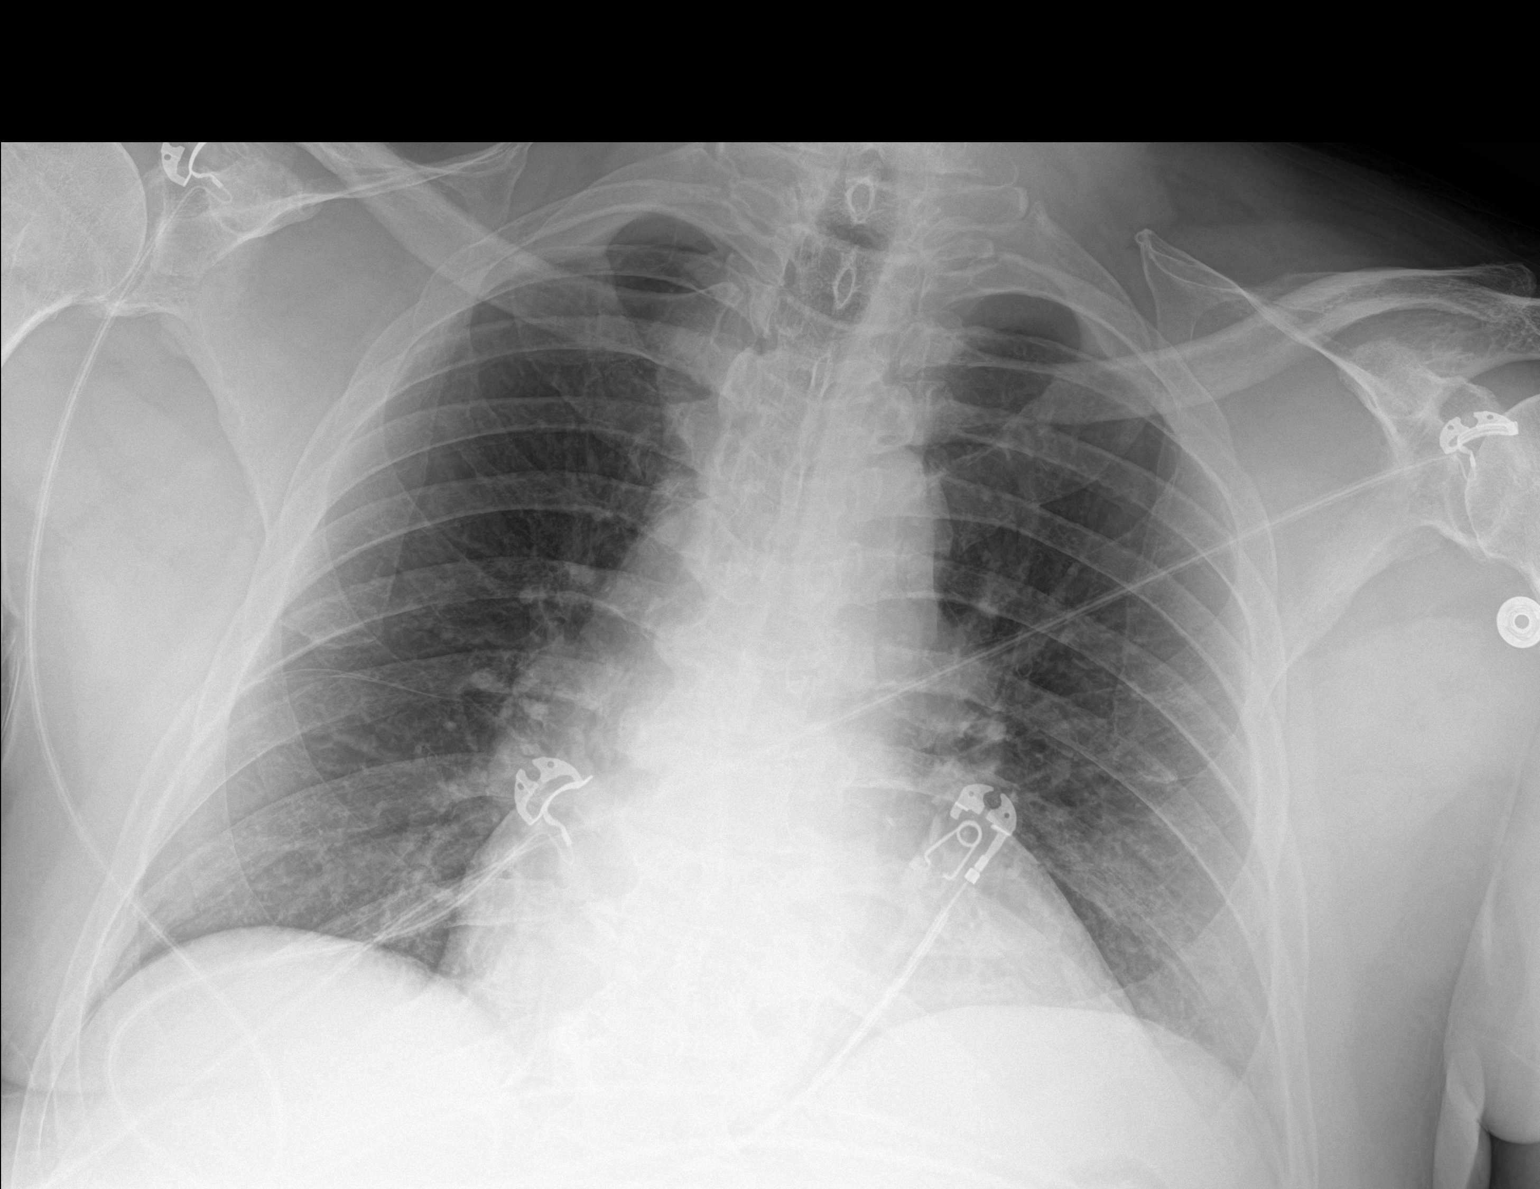

[1 of 1 positions shown; findings below may reference images not displayed]

FINDINGS: Stable cardiomegaly. Lungs are clear. No pleural effusion or
pneumothorax seen. Visualized osseous structures about the chest are
unremarkable.
IMPRESSION: 1. No active disease. No evidence of pneumonia or pulmonary edema.
2. Stable cardiomegaly.

## 2018-05-28 IMAGING — CT CT RENAL STONE PROTOCOL
2 of 4 series · 17 of 46 positions shown, 19 images · non-contrast
Comparison: None.

CLINICAL DATA: Low back pain post elevator fall

EXAM:
CT ABDOMEN AND PELVIS WITHOUT CONTRAST
TECHNIQUE: Multidetector CT imaging of the abdomen and pelvis was performed
following the standard protocol without IV contrast.

[Series 3: renal stone 5.0 · axial · 0.70mm/px · z∈[-550,-140]mm · 14 of 90 slices shown, 16 images]
[im 4/90  soft-tissue]
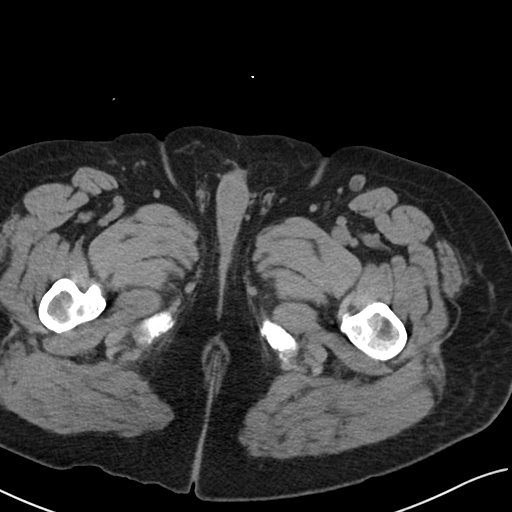
[im 4/90  bone]
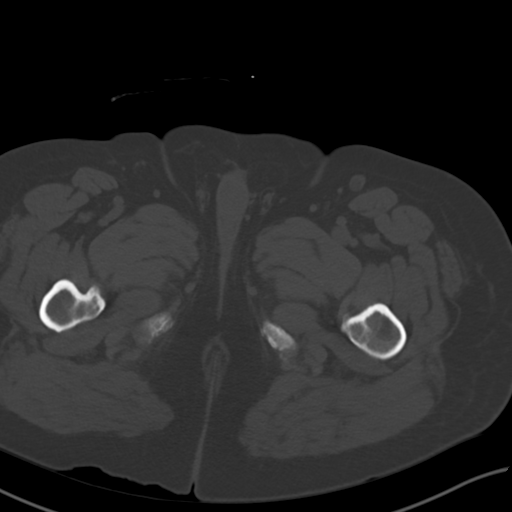
[im 11/90  soft-tissue]
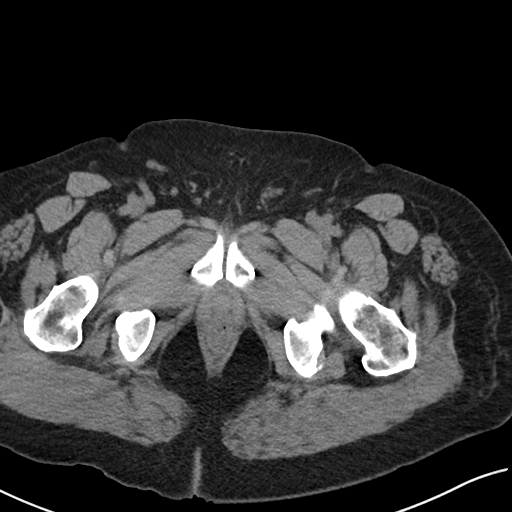
[im 18/90  soft-tissue]
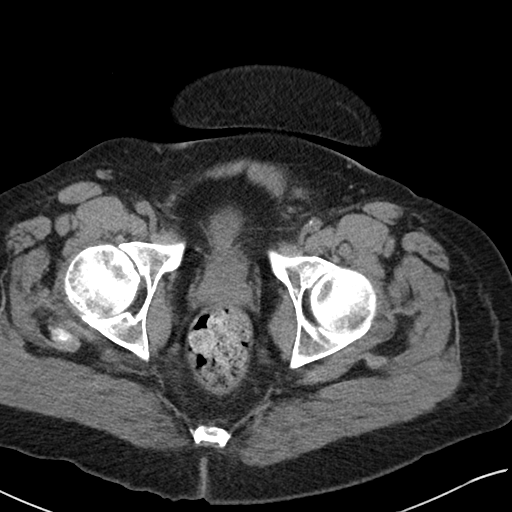
[im 25/90  soft-tissue]
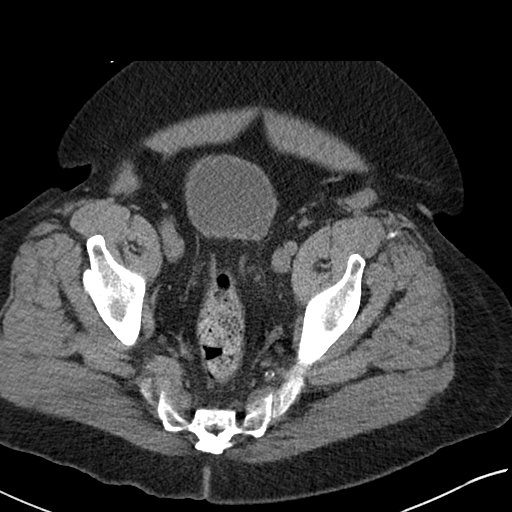
[im 29/90  soft-tissue]
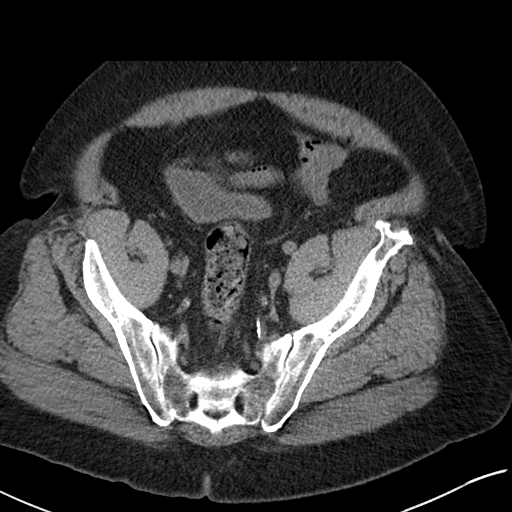
[im 36/90  soft-tissue]
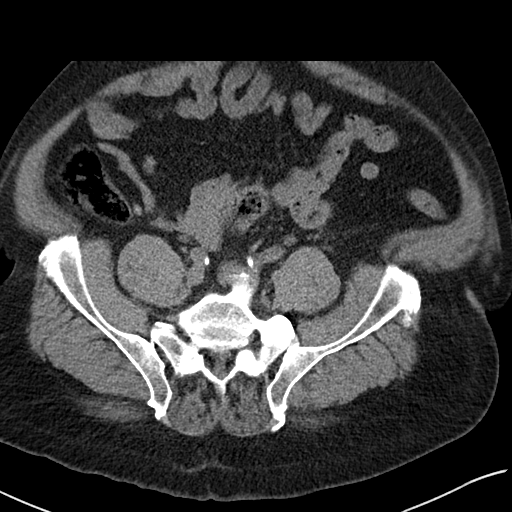
[im 43/90  soft-tissue]
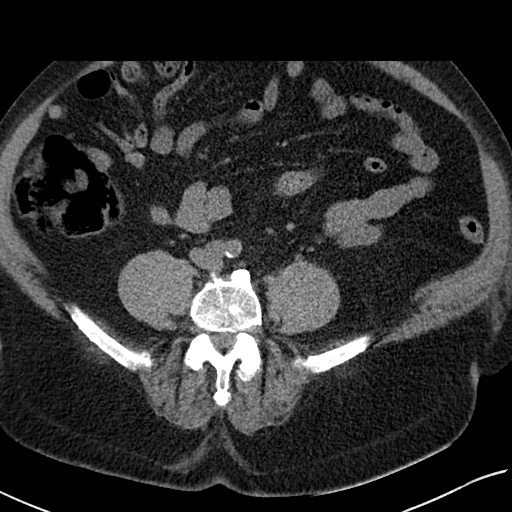
[im 47/90  soft-tissue]
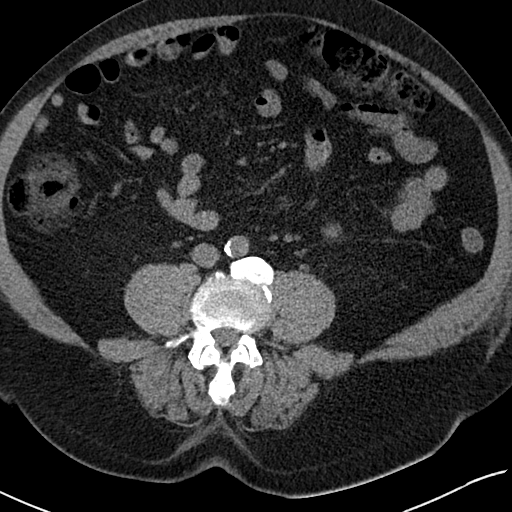
[im 54/90  soft-tissue]
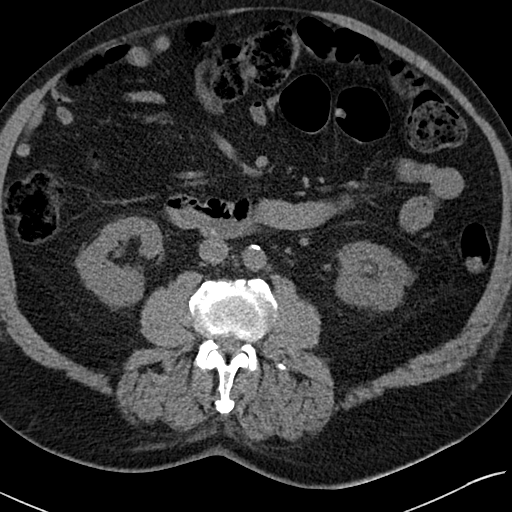
[im 54/90  bone]
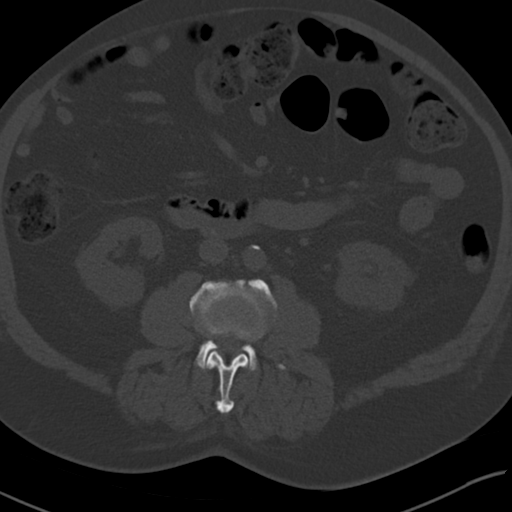
[im 61/90  soft-tissue]
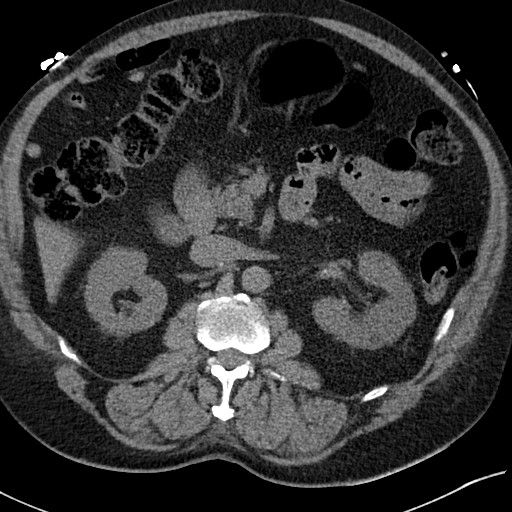
[im 68/90  soft-tissue]
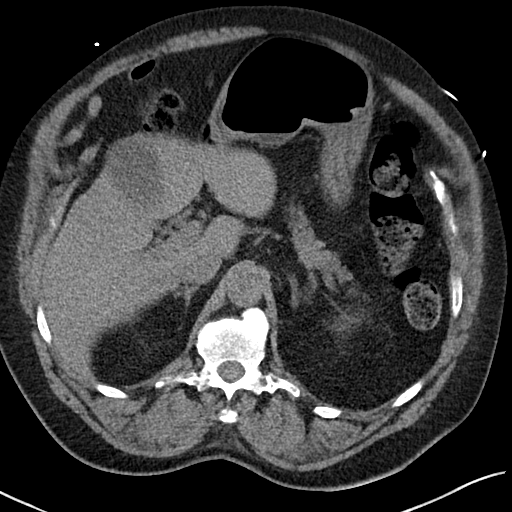
[im 72/90  soft-tissue]
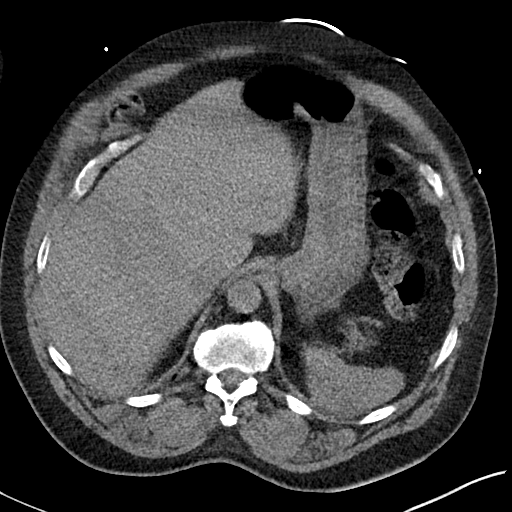
[im 79/90  soft-tissue]
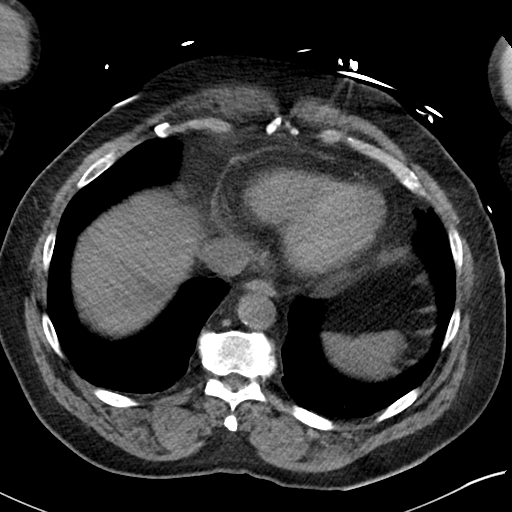
[im 86/90  soft-tissue]
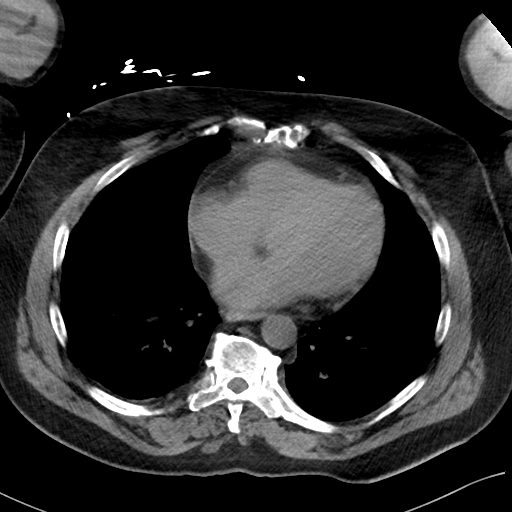

[Series 5: renal stone 3.0 cor · coronal · 0.88mm/px · 3 of 101 slices shown]
[im 34/101  soft-tissue]
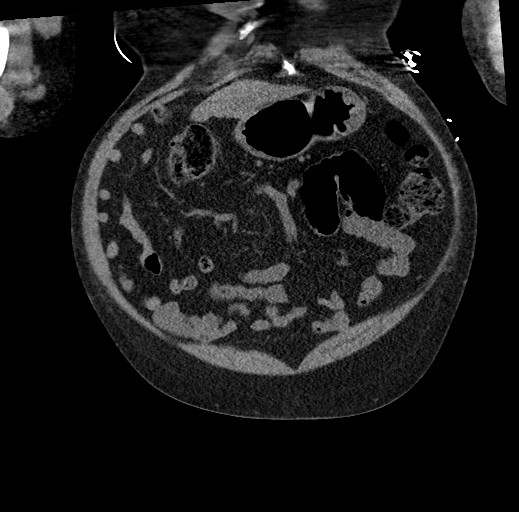
[im 45/101  soft-tissue]
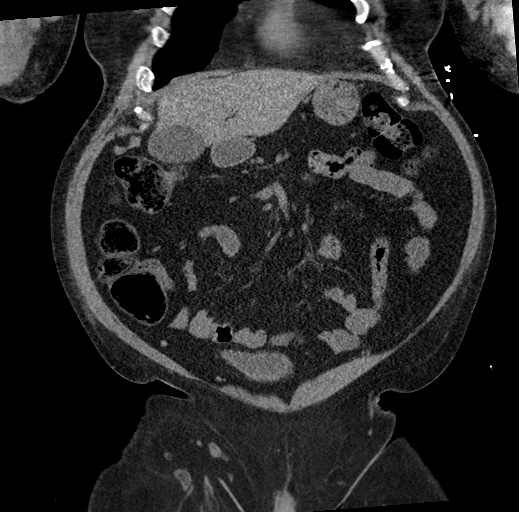
[im 56/101  soft-tissue]
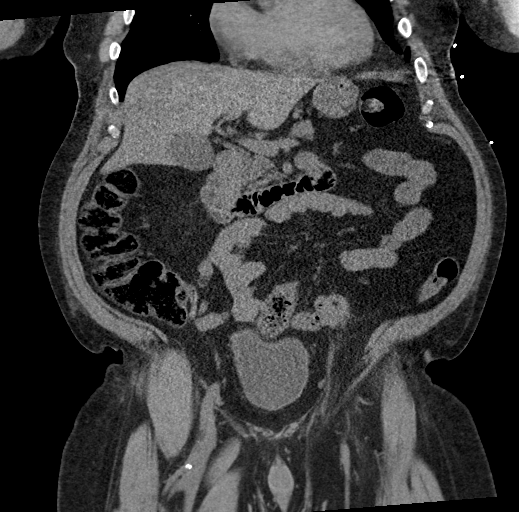

[17 of 46 positions shown; findings below may reference images not displayed]

FINDINGS: Lower chest: No pleural or pericardial effusion. Visualized lung
bases clear.

Hepatobiliary: No focal liver abnormality is seen. No gallstones,
gallbladder wall thickening, or biliary dilatation.

Pancreas: Unremarkable. No pancreatic ductal dilatation or
surrounding inflammatory changes.

Spleen: Normal in size without focal abnormality.

Adrenals/Urinary Tract: 2 cm low-attenuation left adrenal nodule.
Kidneys unremarkable. No hydronephrosis. Urinary bladder
incompletely distended.

Stomach/Bowel: Stomach is incompletely distended. Small bowel
decompressed. Normal appendix. Colon is decompressed, unremarkable.

Vascular/Lymphatic: Patchy aortic calcified plaque without aneurysm.
No abdominal or pelvic adenopathy.

Reproductive: Prostate is unremarkable.

Other: No ascites. No free air.

Musculoskeletal: Bilateral hip DJD. Spurring throughout the lower
thoracic and lumbar spine. No fracture or worrisome bone lesion.
IMPRESSION: 1. No acute findings.
2. 2 cm left adrenal nodule, statistically most likely adenoma in
the absence of a history of primary carcinoma.

## 2018-05-28 MED ORDER — SODIUM ZIRCONIUM CYCLOSILICATE 10 G PO PACK
10.0000 g | PACK | Freq: Once | ORAL | Status: AC
  Administered 2018-05-28: 15:00:00 10 g via ORAL
  Filled 2018-05-28: qty 1

## 2018-05-28 MED ORDER — SODIUM CHLORIDE 0.9 % IV BOLUS: 1000.0000 mL | Freq: Once | INTRAVENOUS | Status: DC

## 2018-05-28 MED ORDER — LIDOCAINE 5 % EX PTCH: 1.0000 | MEDICATED_PATCH | CUTANEOUS | 0 refills | Status: DC

## 2018-05-28 MED ORDER — CYCLOBENZAPRINE HCL 5 MG PO TABS: 5.0000 mg | ORAL_TABLET | Freq: Two times a day (BID) | ORAL | 0 refills | Status: AC | PRN

## 2018-05-28 MED ORDER — SODIUM CHLORIDE 0.9 % IV BOLUS
500.0000 mL | Freq: Once | INTRAVENOUS | Status: AC
  Administered 2018-05-28: 500 mL via INTRAVENOUS

## 2018-05-28 MED ORDER — LOKELMA 10 G PO PACK: 10.0000 g | PACK | Freq: Two times a day (BID) | ORAL | 0 refills | Status: AC

## 2018-05-28 MED ORDER — LIDOCAINE 5 % EX PTCH
1.0000 | MEDICATED_PATCH | CUTANEOUS | Status: DC
  Administered 2018-05-28: 12:00:00 1 via TRANSDERMAL
  Filled 2018-05-28: qty 1

## 2018-05-28 NOTE — ED Notes (Signed)
Pt stqates back pain continues but denies chest pain

## 2018-05-28 NOTE — ED Notes (Signed)
Pt able to ambulate independently, reports 9/10 low back pain during this activity, appears unsteady at times but denies weakness in legs. Pulse ox remained 97% or above during this activity, pulse increased from 59bpm to 64bpm. Denies shob, dizziness.

## 2018-05-28 NOTE — ED Notes (Signed)
Patient transported to CT 

## 2018-05-28 NOTE — ED Triage Notes (Signed)
Pt arrives POV with c/o back pain 2 weeks ago Started as chest pain radiating to back. Pt states no chest pain now but back pain continues treated for muscle spasm last week by PMD

## 2018-05-28 NOTE — ED Notes (Signed)
Pt intermittently sleeping but rouses to voice and answeres questions.

## 2018-05-28 NOTE — ED Notes (Signed)
Pt oriented to call button and urinal at bedside, warm blanket provided

## 2018-05-28 NOTE — ED Provider Notes (Addendum)
[facility] EMERGENCY DEPARTMENT Provider Note   CSN: [id number] Arrival date & time: 05/28/18  0920    History   Chief Complaint Chief Complaint  Patient presents with   Back Pain    HPI Duane Ortiz is a 67 y.o. male with history of CKD, diabetes, HLD, HTN, PE, DDD, on Xarelto presenting today for lower back pain.  Patient reports that his problem first began 2 weeks ago with a central/right sided chest pain that was constant for 7 days with right shoulder pain, seen in ED for similar in the past and was discharged, reports lingering worse with movement since ED visit however patient reports that he took a muscle relaxer 7 days ago and had resolution of his chest and shoulder pain.  Now he reports that he has low back pain.  Patient reports that he has had no chest pain for the past 7 days.  He reports his only problem today is lower back pain greater left compared to right constant throbbing worsened with twisting and turning and without alleviating factors, moderate intensity.  Patient denies any injury or trauma to the area and denies similar pain in the past.  Patient denies fever/chills, cough/shortness of breath, chest pain, abdominal pain, nausea/vomiting, decrease in urination, dysuria/hematuria, extremity pain/swelling or color change or any additional concerns at this time.     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 Authorizing Provider  allopurinol (ZYLOPRIM) 100 MG tablet Take 100 mg by mouth daily.   Yes [provider]  amLODipine (NORVASC) 10 MG tablet Take 10 mg by mouth daily.  04/11/18  Yes [provider]  baclofen (LIORESAL) 10 MG tablet Take 10 mg by mouth 2 (two) times daily as needed for muscle spasms.  04/27/18  Yes [provider]  cloNIDine (CATAPRES) 0.1 MG tablet Take 0.1 mg by mouth daily.  04/20/18  Yes [provider]  furosemide (LASIX) 20 MG tablet Take 20 mg by mouth daily.  05/02/18  Yes [provider]  gabapentin (NEURONTIN) 300 MG capsule Take 300 mg by mouth 2 (two) times daily.  12/18/17  Yes [provider]  HYDROcodone-acetaminophen (NORCO) 7.5-325 MG tablet Take 1 tablet by mouth 3 (three) times daily as needed for pain. 05/24/18  Yes [provider]  hydrOXYzine (ATARAX/VISTARIL) 10 MG tablet Take 1 tablet (10  mg total) by mouth every 8 (eight) hours as needed. Patient taking differently: Take 10 mg by mouth every 8 (eight) hours as needed for anxiety.  02/17/16  Yes Carmin Muskrat, MD  Magnesium Oxide 400 (240 Mg) MG TABS Take 400 mg by mouth daily.  05/02/18  Yes [provider]  nebivolol (BYSTOLIC) 10 MG tablet Take 10 mg by mouth daily.   Yes [provider]  NONFORMULARY OR COMPOUNDED ITEM [facility]:  Peripheral Neuropathy Cream - Bupivacaine 1%, Doxepin 3%, Gabapentin 6%, Pentoxifylline 3%, Topiramate 1%.  Apply 1-2 grams to affected area 3-4 times a day. 02/22/18  Yes Marzetta Board, DPM  omeprazole (PRILOSEC) 20 MG capsule Take 20 mg by mouth daily.  04/24/18  Yes [provider]  pravastatin (PRAVACHOL) 40 MG tablet Take 40 mg by mouth daily.   Yes [provider]  rivaroxaban (XARELTO) 20 MG TABS tablet Take 20 mg by mouth every morning.    Yes [provider]  sodium bicarbonate 650 MG tablet Take 1,300 mg by mouth daily.    Yes [provider]  tamsulosin (FLOMAX) 0.4 MG CAPS capsule Take 0.4 mg by mouth daily.   Yes [provider]  cyclobenzaprine (FLEXERIL) 5 MG tablet Take 1 tablet (5 mg total) by mouth 2 (two) times daily as needed for up to 7 days for muscle spasms. 05/28/18 06/04/18  Nuala Alpha A, PA-C  lidocaine (LIDODERM) 5 % Place 1 patch onto the skin daily. Remove & Discard patch within 12 hours or as directed by MD 05/28/18   Deliah Boston, PA-C  LOKELMA 10 g PACK packet Take 10 g by mouth 2 (two) times daily for 2 days. 05/28/18 05/30/18  Deliah Boston, PA-C    Family History Family History  Problem Relation Age of Onset   Diabetes Mother    Heart disease Mother    Hypertension Mother    Diabetes Sister    Hypertension Sister    Diabetes Brother    Hypertension Brother    Breast cancer Paternal Grandmother     Social History Social History   Tobacco Use   Smoking status: Current Some Day Smoker    Packs/day: 0.25    Types: Cigarettes   Smokeless tobacco: Never Used   Tobacco comment: 1 packs per week  Substance Use Topics   Alcohol use: Yes    Alcohol/week: 0.0 standard drinks   Drug use: No     Allergies   Sulfa antibiotics   Review of Systems Review of Systems  Constitutional: Negative.  Negative for chills and fever.  Eyes: Negative.  Negative for visual disturbance.  Respiratory: Negative.  Negative for cough and shortness of breath.   Cardiovascular: Negative for chest pain  (None x7 days, wass worse with movement of the arm, pain-free).  Gastrointestinal: Negative.  Negative for abdominal pain, diarrhea, nausea and vomiting.  Genitourinary: Negative.  Negative for dysuria and hematuria.       Denies decreased urination  Musculoskeletal: Positive for back pain. Negative for neck pain.  Neurological: Negative.  Negative for weakness and headaches.  All other systems reviewed and are negative.  Physical Exam Updated Vital Signs BP (!) 159/95 (BP Location: Right Arm)    Pulse (!) 58    Temp 98.1 F (36.7 C) (Oral)    Resp 14    Ht 5\' 11"  (1.803 m)    Wt 108.9 kg    SpO2 99%    BMI 33.47 kg/m  Physical Exam Constitutional:      General: He is not in acute distress.    Appearance: Normal appearance. He is well-developed. He is obese. He is not ill-appearing or diaphoretic.  HENT:     Head: Normocephalic and atraumatic.     Right Ear: External ear normal.     Left Ear: External ear normal.     Nose: Nose normal.  Eyes:     General: Vision grossly intact. Gaze aligned appropriately.     Pupils: Pupils are equal, round, and reactive to light.  Neck:     Musculoskeletal: Normal range of motion.     Trachea: Trachea and phonation normal. No tracheal deviation.  Cardiovascular:     Rate and Rhythm: Normal rate and regular rhythm.     Pulses: Normal pulses.          Radial pulses are 2+ on the right side and 2+ on the left side.       Dorsalis pedis pulses are 2+ on the right side and 2+ on the left side.       Posterior tibial pulses are 2+ on the right side and 2+ on the left side.     Heart sounds: Normal heart sounds.  Pulmonary:     Effort: Pulmonary effort is normal. No accessory muscle usage or respiratory distress.     Breath sounds: Normal breath sounds and air entry. No rhonchi.  Chest:     Chest wall: No deformity, tenderness or crepitus.  Abdominal:     General: There is no distension.     Palpations: Abdomen is soft. There is no pulsatile  mass.     Tenderness: There is no abdominal tenderness. There is no guarding or rebound.  Musculoskeletal: Normal range of motion.       Back:     Right lower leg: He exhibits no tenderness. 1+ Edema present.     Left lower leg: He exhibits no tenderness. 1+ Edema present.     Comments: No midline C/T/L spinal tenderness to palpation, no deformity, crepitus, or step-off noted. No sign of injury to the neck or back.  Reproducible tenderness to light palpation of the paraspinal lumbar musculature greater left than right without sign of injury.  Hips stable to compression bilaterally without pain.  Ambulatory around room without assistance or difficulty.   Skin:    General: Skin is warm and dry.  Neurological:     Mental Status: He is alert.     GCS: GCS eye subscore is 4. GCS verbal subscore is 5. GCS motor subscore is 6.     Comments: Speech is clear and goal oriented, follows commands Major Cranial nerves without deficit, no facial droop Moves extremities without ataxia, coordination intact Normal gait No clonus of the feet  Psychiatric:        Behavior: Behavior normal.      ED Treatments / Results  Labs (all labs ordered are listed, but only abnormal results are displayed) Labs Reviewed  CBC WITH DIFFERENTIAL/PLATELET - Abnormal; Notable for the following components:      Result Value   RBC 3.54 (*)    Hemoglobin 9.9 (*)    HCT 32.1 (*)    All other components within normal limits  COMPREHENSIVE METABOLIC PANEL - Abnormal; Notable for the following components:   Potassium 6.1 (*)    CO2 20 (*)    Glucose, Bld 114 (*)    BUN 34 (*)    Creatinine, Ser 3.12 (*)  Total Protein 6.3 (*)    Albumin 3.0 (*)    GFR calc non Af Amer 19 (*)    GFR calc Af Amer 22 (*)    All other components within normal limits  URINALYSIS, ROUTINE W REFLEX MICROSCOPIC - Abnormal; Notable for the following components:   Color, Urine STRAW (*)    Protein, ur 100 (*)    Bacteria, UA RARE  (*)    All other components within normal limits  TROPONIN I    EKG EKG Interpretation  Date/Time:  Monday May 28 2018 09:49:07 EDT Ventricular Rate:  57 PR Interval:    QRS Duration: 83 QT Interval:  386 QTC Calculation: 376 R Axis:   -9 Text Interpretation:  Sinus rhythm Prolonged PR interval Low voltage, precordial leads Nonspecific T abnormalities, diffuse leads Confirmed by Quintella Reichert [id number]) on 05/28/2018 10:08:52 AM   Radiology Ct L-spine No Charge  Result Date: 05/28/2018 CLINICAL DATA:  Pt was in an elevator that fell a couple stories then stopped quickly he has had Low back pain since that time It has gotten worse in past week EXAM: CT LUMBAR SPINE WITHOUT CONTRAST TECHNIQUE: Multidetector CT imaging of the lumbar spine was performed without intravenous contrast administration. Multiplanar CT image reconstructions were also generated. COMPARISON:  MR 12/02/2016 FINDINGS: Segmentation: Numbering scheme follows that established on previous MRI, the lowest open interspace L5-S1. 5 non rib-bearing lumbar segments labeled L1-L5. Hypoplastic ribs at T12. L5 is a transitional segment. Alignment: Normal Vertebrae: Negative for fracture. Anterior endplate spurring at all levels T10-S1, most exuberant L2-L5. facet DJD L4-S1 bilaterally. Paraspinal and other soft tissues: No evidence of paraspinal or epidural hematoma. Aortic Atherosclerosis (ICD10-170.0). Disc levels: Well maintained throughout. IMPRESSION: 1. Negative for lumbar fracture or other acute finding. 2. Multilevel spondylitic changes as above. Electronically Signed   By: Lucrezia Europe M.D.   On: 05/28/2018 11:30   Dg Chest Portable 1 View  Result Date: 05/28/2018 CLINICAL DATA:  Chest pain and generalized back pain for several weeks. No known trauma. EXAM: PORTABLE CHEST 1 VIEW COMPARISON:  Chest x-ray dated 03/31/2018. FINDINGS: Stable cardiomegaly. Lungs are clear. No pleural effusion or pneumothorax seen. Visualized osseous  structures about the chest are unremarkable. IMPRESSION: 1. No active disease. No evidence of pneumonia or pulmonary edema. 2. Stable cardiomegaly. Electronically Signed   By: Franki Cabot M.D.   On: 05/28/2018 10:17   Ct Renal Stone Study  Result Date: 05/28/2018 CLINICAL DATA:  Low back pain post elevator fall EXAM: CT ABDOMEN AND PELVIS WITHOUT CONTRAST TECHNIQUE: Multidetector CT imaging of the abdomen and pelvis was performed following the standard protocol without IV contrast. COMPARISON:  None. FINDINGS: Lower chest: No pleural or pericardial effusion. Visualized lung bases clear. Hepatobiliary: No focal liver abnormality is seen. No gallstones, gallbladder wall thickening, or biliary dilatation. Pancreas: Unremarkable. No pancreatic ductal dilatation or surrounding inflammatory changes. Spleen: Normal in size without focal abnormality. Adrenals/Urinary Tract: 2 cm low-attenuation left adrenal nodule. Kidneys unremarkable. No hydronephrosis. Urinary bladder incompletely distended. Stomach/Bowel: Stomach is incompletely distended. Small bowel decompressed. Normal appendix. Colon is decompressed, unremarkable. Vascular/Lymphatic: Patchy aortic calcified plaque without aneurysm. No abdominal or pelvic adenopathy. Reproductive: Prostate is unremarkable. Other: No ascites. No free air. Musculoskeletal: Bilateral hip DJD. Spurring throughout the lower thoracic and lumbar spine. No fracture or worrisome bone lesion. IMPRESSION: 1. No acute findings. 2. 2 cm left adrenal nodule, statistically most likely adenoma in the absence of a history of primary carcinoma. Electronically Signed  By: Lucrezia Europe M.D.   On: 05/28/2018 11:03    Procedures Procedures (including critical care time)  Medications Ordered in ED Medications  lidocaine (LIDODERM) 5 % 1 patch (1 patch Transdermal Patch Applied 05/28/18 1150)  sodium chloride 0.9 % bolus 500 mL (0 mLs Intravenous Stopped 05/28/18 1151)  sodium zirconium  cyclosilicate (LOKELMA) packet 10 g (10 g Oral Given 05/28/18 1430)     Initial Impression / Assessment and Plan / ED Course  I have reviewed the triage vital signs and the nursing notes.  Pertinent labs & imaging results that were available during my care of the patient were reviewed by me and considered in my medical decision making (see chart for details).  Clinical Course as of May 27 1517  Mon May 28, 2018  1247 Lokelma, Spironolactone, walk, pain control.   [BM]    Clinical Course User Index [BM] Deliah Boston, PA-C     9:45 AM: Central chest pain 2 weeks ago constant x1 week resolved 7 days ago, history of msk chest wall pain, was worse with arm movements, resolved with muscle relaxers; Patient  now with 7 days of lower back pain, denies chest pain x7 days.  Back pain is reproducible on examination with light palpation of the lower back.  Distal pulses intact to all 4 extremities.  Blood pressure soft, patient chronically ill-appearing however in no acute distress.  History of CKD most recent creatinine 3.1. Case discussed with Dr. Ralene Bathe who is at bedside. - Patient seen and evaluated by Dr. Ralene Bathe, advises CT stone study with no charge lumbar spine. Suspect msk etiology of pain. - CBC with hemoglobin of 9.9, appears baseline Troponin negative Urinalysis with proteinuria EKG: Sinus rhythm Prolonged PR interval Low voltage, precordial leads Nonspecific T abnormalities, diffuse leads Confirmed by Quintella Reichert DG Chest:    IMPRESSION:  1. No active disease. No evidence of pneumonia or pulmonary edema.  2. Stable cardiomegaly.   CT Renal:  IMPRESSION:  1. No acute findings.  2. 2 cm left adrenal nodule, statistically most likely adenoma in  the absence of a history of primary carcinoma.   CT Lspine:  IMPRESSION:  1. Negative for lumbar fracture or other acute finding.  2. Multilevel spondylitic changes as above.   CMP: Creatinine 3.12 appears baseline, potassium 6.1,  patient is on spironolactone as well as Lokelma; discussed with Dr. Ralene Bathe and pharmacy, plan at this time is to have patient stop taking spironolactone and begin taking glaucoma, follow-up with PCP this week for repeat blood work. - As to patient's back pain it is consistently reproducible with light palpation of the paraspinal musculature, no evidence of acute findings on CT scan today.  No history of trauma, fever, IV drug use, night sweats, weight loss, saddle area paresthesias, urinary retention, bowel/bladder incontinence and no neuro deficit on examination.  He is ambulatory in the emergency department without assistance or difficulty.  Abdomen is soft/nontender without palpable pulsatile masses and distal pulses are equal in all 4 extremities without tactile changes.  Doubt spinal epidural abscess, cauda equina or AAA as etiology of his pain.  Plan to treat with Lidoderm, Flexeril and PCP follow-up.  Patient educated on precautions regarding muscle relaxers and states understanding.  Patient reports pain has improved following Lidoderm patch application today.  As to patient's chest pain greater than 1 week ago, troponin is negative and EKG is without change, he has no exertional chest pain or chest pain at this time.  Do  not suspect ACS, dissection, PE, patient on Xarelto, or other acute cardiopulmonary etiologies of his symptoms.  He was diagnosed with chest wall pain in March of this year and reports that this pain episode was similar, he is chest pain-free for greater than 1 week, his pain resolved following most relaxers.  He is already established with cardiology and we advised him to call their office to schedule follow-up appointment as it appears to have been over 1 year since he was last seen.  At this time there does not appear to be any evidence of an acute emergency medical condition and the patient appears stable for discharge with appropriate outpatient follow up. Diagnosis was discussed  with patient who verbalizes understanding of care plan and is agreeable to discharge. I have discussed return precautions with patient who verbalizes understanding of return precautions. Patient strongly encouraged to follow-up with their PCP this week for repeat blood work. All questions answered.  Patient has been discharged in good condition, ambulatory.  Patient's case rediscussed with Dr. Ralene Bathe who has seen the patient and agrees with plan to discharge with follow-up.   Note: Portions of this report may have been transcribed using voice recognition software. Every effort was made to ensure accuracy; however, inadvertent computerized transcription errors may still be present. Final Clinical Impressions(s) / ED Diagnoses   Final diagnoses:  Low back pain  Left-sided low back pain without sciatica, unspecified chronicity  Hyperkalemia    ED Discharge Orders         Ordered    LOKELMA 10 g PACK packet  2 times daily     05/28/18 1451    lidocaine (LIDODERM) 5 %  Every 24 hours     05/28/18 1451    cyclobenzaprine (FLEXERIL) 5 MG tablet  2 times daily PRN     05/28/18 1456           Deliah Boston, PA-C 05/28/18 1518    Deliah Boston, PA-C 05/28/18 1519    Quintella Reichert, MD 05/30/18 [id number]

## 2018-05-28 NOTE — Discharge Instructions (Addendum)
You have been diagnosed today with left-sided lower back pain without sciatica and elevated potassium level.  At this time there does not appear to be the presence of an emergent medical condition, however there is always the potential for conditions to change. Please read and follow the below instructions.  Please return to the Emergency Department immediately for any new or worsening symptoms. Please be sure to follow up with your Primary Care Provider within one week regarding your visit today; please call their office to schedule an appointment even if you are feeling better for a follow-up visit. You may use the muscle relaxer Flexeril as prescribed to help with your lower back pain.  This medication may make you sleepy so please avoid driving or operating heavy machinery when taking the medication.  Do not take this medication with other sedating drugs or with alcohol.  You may also use the numbing patch Lidoderm as prescribed to help with your back pain. Your potassium level was elevated today, please stop taking your medication spironolactone as this will elevate your potassium levels.  Please take low, as prescribed to help lower your potassium levels, you have been given your first dose here in the emergency department today you may take your next dose tonight and then finish the medication with 2 doses tomorrow 12 hours apart.  Please call your primary care doctor's office tomorrow morning to schedule a follow-up appointment you will need to have repeat blood work to ensure improvement of your potassium level.  Return to emergency department for any new or worsening symptoms. Additionally please discuss your chest wall pain that you are previously diagnosed with with your primary care provider as well as with your cardiologist.  Call both their offices tomorrow to schedule follow-up appointments over the next week.  Call 911 and return to emergency room immediately if you develop any chest pain or  shortness of breath.  Get help right away if: You are short of breath. You have chest pain. You pass out (faint). You cannot move your muscles. Get help right away if: You develop new bowel or bladder control problems. You have unusual weakness or numbness in your arms or legs. You develop nausea or vomiting. You develop abdominal pain. You feel faint. Any new/concerning or worsening symptoms  Please read the additional information packets attached to your discharge summary.  Do not take your medicine if  develop an itchy rash, swelling in your mouth or lips, or difficulty breathing.  Call 911/seek emergency medical attention immediately if this occurs.

## 2018-05-28 NOTE — ED Notes (Signed)
Discharge instructions discussed at length and pt staters he understands both follow and medication changes. Pt dischaerged via wc with printed instructikons for same. Home stable and much more comfortable.

## 2018-06-02 ENCOUNTER — Encounter: Payer: Self-pay | Admitting: Podiatry

## 2018-06-02 NOTE — Progress Notes (Signed)
Subjective: Duane Ortiz presents today with history of diabetic neuropathy for preventative diabetic foot care. Patient seen for follow up of chronic, painful mycotic toenails which interfere with daily activities and routine tasks.  Pain is aggravated when wearing enclosed shoe gear. Pain is getting progressively worse and relieved with periodic professional debridement.   Pt relates dark lesion medial aspect right hallux x 1 week which was tender. He noticed it about the same tie he took his blood pressure pill. He has done nothing to treat area.  Rogers Blocker, MD is his PCP.    Current Outpatient Medications:  .  allopurinol (ZYLOPRIM) 100 MG tablet, Take 100 mg by mouth daily., Disp: , Rfl:  .  amLODipine (NORVASC) 10 MG tablet, Take 10 mg by mouth daily. , Disp: , Rfl:  .  baclofen (LIORESAL) 10 MG tablet, Take 10 mg by mouth 2 (two) times daily as needed for muscle spasms. , Disp: , Rfl:  .  cloNIDine (CATAPRES) 0.1 MG tablet, Take 0.1 mg by mouth daily. , Disp: , Rfl:  .  furosemide (LASIX) 20 MG tablet, Take 20 mg by mouth daily. , Disp: , Rfl:  .  gabapentin (NEURONTIN) 300 MG capsule, Take 300 mg by mouth 2 (two) times daily. , Disp: , Rfl:  .  hydrOXYzine (ATARAX/VISTARIL) 10 MG tablet, Take 1 tablet (10 mg total) by mouth every 8 (eight) hours as needed. (Patient taking differently: Take 10 mg by mouth every 8 (eight) hours as needed for anxiety. ), Disp: 25 tablet, Rfl: 0 .  Magnesium Oxide 400 (240 Mg) MG TABS, Take 400 mg by mouth daily. , Disp: , Rfl:  .  nebivolol (BYSTOLIC) 10 MG tablet, Take 10 mg by mouth daily., Disp: , Rfl:  .  NONFORMULARY OR COMPOUNDED ITEM, [facility]:  Peripheral Neuropathy Cream - Bupivacaine 1%, Doxepin 3%, Gabapentin 6%, Pentoxifylline 3%, Topiramate 1%. Apply 1-2 grams to affected area 3-4 times a day., Disp: 60 each, Rfl: 11 .  omeprazole (PRILOSEC) 20 MG capsule, Take 20 mg by mouth daily. , Disp: , Rfl:  .  pravastatin (PRAVACHOL) 40  MG tablet, Take 40 mg by mouth daily., Disp: , Rfl:  .  rivaroxaban (XARELTO) 20 MG TABS tablet, Take 20 mg by mouth every morning. , Disp: , Rfl:  .  sodium bicarbonate 650 MG tablet, Take 1,300 mg by mouth daily. , Disp: , Rfl:  .  tamsulosin (FLOMAX) 0.4 MG CAPS capsule, Take 0.4 mg by mouth daily., Disp: , Rfl:  .  cyclobenzaprine (FLEXERIL) 5 MG tablet, Take 1 tablet (5 mg total) by mouth 2 (two) times daily as needed for up to 7 days for muscle spasms., Disp: 14 tablet, Rfl: 0 .  HYDROcodone-acetaminophen (NORCO) 7.5-325 MG tablet, Take 1 tablet by mouth 3 (three) times daily as needed for pain., Disp: , Rfl:  .  lidocaine (LIDODERM) 5 %, Place 1 patch onto the skin daily. Remove & Discard patch within 12 hours or as directed by MD, Disp: 30 patch, Rfl: 0  Allergies  Allergen Reactions  . Sulfa Antibiotics Itching   Objective: Vitals:   05/23/18 1139  Temp: (!) 96.6 F (35.9 C)   Vascular Examination: Capillary refill time immediate x 10 digits.  Dorsalis pedis pulses palpable b/l.  Posterior tibial pulses faintly palpable b/l.  No digital hair x 10 digits.  Skin temperature gradient WNL b/l.  Dermatological Examination: Skin with normal turgor, texture and tone b/l.  Toenails 1-5 b/l discolored, thick, dystrophic  with subungual debris and pain with palpation to nailbeds due to thickness of nails.  Resolving blood blister noted medial hallux. No erythema, no edema, no drainage, no flocculence.  Musculoskeletal: Muscle strength 5/5 to all LE muscle groups.  Neurological: Sensation diminished with 10 gram monofilament.  Vibratory sensation diminished  Assessment: 1. Painful onychomycosis toenails 1-5 b/l 2. NIDDM with neuropathy  Plan: 1. Toenails 1-5 b/l were debrided in length and girth without iatrogenic bleeding. 2. Regarding resolving blood blister, we discussed shoe gear. Patient to continue soft, supportive shoe gear daily. 3. Patient to report any pedal  injuries to medical professional immediately. 4. Follow up 3 months.  5. Patient/POA to call should there be a concern in the interim.

## 2018-07-12 DIAGNOSIS — G4700 Insomnia, unspecified: Secondary | ICD-10-CM | POA: Insufficient documentation

## 2018-07-12 DIAGNOSIS — D689 Coagulation defect, unspecified: Secondary | ICD-10-CM | POA: Insufficient documentation

## 2018-07-12 DIAGNOSIS — Z86711 Personal history of pulmonary embolism: Secondary | ICD-10-CM | POA: Insufficient documentation

## 2018-07-12 DIAGNOSIS — M5116 Intervertebral disc disorders with radiculopathy, lumbar region: Secondary | ICD-10-CM | POA: Insufficient documentation

## 2018-07-25 ENCOUNTER — Other Ambulatory Visit: Payer: Self-pay

## 2018-07-25 ENCOUNTER — Telehealth: Payer: Self-pay | Admitting: Neurology

## 2018-07-25 ENCOUNTER — Encounter: Payer: Self-pay | Admitting: Neurology

## 2018-07-25 ENCOUNTER — Ambulatory Visit (INDEPENDENT_AMBULATORY_CARE_PROVIDER_SITE_OTHER): Payer: Medicare HMO | Admitting: Neurology

## 2018-07-25 VITALS — BP 169/81 | HR 63 | Temp 97.5°F | Ht 71.0 in | Wt 233.0 lb

## 2018-07-25 DIAGNOSIS — R404 Transient alteration of awareness: Secondary | ICD-10-CM | POA: Diagnosis not present

## 2018-07-25 MED ORDER — ALPRAZOLAM 0.5 MG PO TABS: ORAL_TABLET | ORAL | 0 refills | Status: DC

## 2018-07-25 NOTE — Telephone Encounter (Signed)
Mcarthur Rossetti Josem Kaufmann: [phone] (exp. 07/25/18 to 08/24/18) order sent to GI. They will reach out to the patient to schedule.

## 2018-07-25 NOTE — Progress Notes (Signed)
Reason for visit: Right hand weakness, tremor  Referring physician: Dr. Rae Roam is a 68 y.o. male  History of present illness:  Duane Ortiz is a 68 year old right-handed black male with a history of chronic low back pain, chronic renal insufficiency, diabetes, and diabetic peripheral neuropathy.  The patient was seen in the emergency room on 28 May 2018 with low back pain, he was placed on Flexeril, but apparently he had a reaction to the medication with confusion.  The patient stopped the medication, but ever since that event, he has had some alteration in the function of his right hand.  He notes that he will drop things from the hand, he reports no definite numbness of the hand.  The patient does have a prior history of cervical spine surgery in 1995, he claims that he has had a chronic gait disorder ever since he had neck surgery.  The patient indicates that when he is upset or angry, he may have some actual tremor in the right arm but otherwise no tremors are noted.  He reports no weakness of the right leg or the right face, he reports no vision change or problems with speech or swallowing.  He does have chronic balance issues, he has not had any recent falls.  He denies issues controlling the bladder, he does have some constipation problems.  He denies any neck pain or pain down the arms on either side but he does have back pain.  He is being followed through a pain center currently.  Past Medical History:  Diagnosis Date  . Chronic kidney disease   . Chronic kidney disease   . Diabetes ([place]) 02/012017  . Fainting   . Gait abnormality 10/11/2016  . Gout   . Hypercholesteremia   . Hypercholesterolemia   . Hypertension   . Pulmonary embolism ([place])   . Shortness of breath     Past Surgical History:  Procedure Laterality Date  . CERVICAL DISCECTOMY    . COLONOSCOPY    . neck spine disk surgery  01/03/1993    Family History  Problem Relation Age of Onset  .  Diabetes Mother   . Heart disease Mother   . Hypertension Mother   . Diabetes Sister   . Hypertension Sister   . Diabetes Brother   . Hypertension Brother   . Breast cancer Paternal Grandmother     Social history:  reports that he has been smoking cigarettes. He has been smoking about 0.25 packs per day. He has never used smokeless tobacco. He reports current alcohol use. He reports that he does not use drugs.  Medications:  Prior to Admission medications   Medication Sig Start Date End Date Taking? Authorizing Provider  allopurinol (ZYLOPRIM) 100 MG tablet Take 100 mg by mouth daily.    [provider]  amLODipine (NORVASC) 10 MG tablet Take 10 mg by mouth daily.  04/11/18   [provider]  baclofen (LIORESAL) 10 MG tablet Take 10 mg by mouth 2 (two) times daily as needed for muscle spasms.  04/27/18   [provider]  cloNIDine (CATAPRES) 0.1 MG tablet Take 0.1 mg by mouth daily.  04/20/18   [provider]  furosemide (LASIX) 20 MG tablet Take 20 mg by mouth daily.  05/02/18   [provider]  gabapentin (NEURONTIN) 300 MG capsule Take 300 mg by mouth 2 (two) times daily.  12/18/17   [provider]  HYDROcodone-acetaminophen (NORCO) 7.5-325 MG tablet Take  1 tablet by mouth 3 (three) times daily as needed for pain. 05/24/18   [provider]  hydrOXYzine (ATARAX/VISTARIL) 10 MG tablet Take 1 tablet (10 mg total) by mouth every 8 (eight) hours as needed. Patient taking differently: Take 10 mg by mouth every 8 (eight) hours as needed for anxiety.  02/17/16   Carmin Muskrat, MD  lidocaine (LIDODERM) 5 % Place 1 patch onto the skin daily. Remove & Discard patch within 12 hours or as directed by MD 05/28/18   Deliah Boston, PA-C  Magnesium Oxide 400 (240 Mg) MG TABS Take 400 mg by mouth daily.  05/02/18   [provider]  nebivolol (BYSTOLIC) 10 MG tablet Take 10 mg by mouth daily.    [provider]   NONFORMULARY OR COMPOUNDED ITEM [facility]:  Peripheral Neuropathy Cream - Bupivacaine 1%, Doxepin 3%, Gabapentin 6%, Pentoxifylline 3%, Topiramate 1%. Apply 1-2 grams to affected area 3-4 times a day. 02/22/18   Marzetta Board, DPM  omeprazole (PRILOSEC) 20 MG capsule Take 20 mg by mouth daily.  04/24/18   [provider]  pravastatin (PRAVACHOL) 40 MG tablet Take 40 mg by mouth daily.    [provider]  rivaroxaban (XARELTO) 20 MG TABS tablet Take 20 mg by mouth every morning.     [provider]  sodium bicarbonate 650 MG tablet Take 1,300 mg by mouth daily.     [provider]  tamsulosin (FLOMAX) 0.4 MG CAPS capsule Take 0.4 mg by mouth daily.    [provider]      Allergies  Allergen Reactions  . Sulfa Antibiotics Itching    ROS:  Out of a complete 14 system review of symptoms, the patient complains only of the following symptoms, and all other reviewed systems are negative.  Low back pain Leg swelling  Blood pressure (!) 169/81, pulse 63, temperature (!) 97.5 F (36.4 C), temperature source Temporal, height 5\' 11"  (1.803 m), weight 233 lb (105.7 kg).  Physical Exam  General: The patient is alert and cooperative at the time of the examination.  Eyes: Pupils are equal, round, and reactive to light. Discs are flat bilaterally.  Neck: The neck is supple, no carotid bruits are noted.  Respiratory: The respiratory examination is clear.  Cardiovascular: The cardiovascular examination reveals a regular rate and rhythm, no obvious murmurs or rubs are noted.  Skin: Extremities are with 3-4+ edema below the knees bilaterally.  Neurologic Exam  Mental status: The patient is alert and oriented x 3 at the time of the examination. The patient has apparent normal recent and remote memory, with an apparently normal attention span and concentration ability.  Cranial nerves: Facial symmetry is present. There is good sensation  of the face to pinprick and soft touch bilaterally. The strength of the facial muscles and the muscles to head turning and shoulder shrug are normal bilaterally. Speech is well enunciated, no aphasia or dysarthria is noted. Extraocular movements are full. Visual fields are full. The tongue is midline, and the patient has symmetric elevation of the soft palate. No obvious hearing deficits are noted.  Motor: The motor testing reveals 5 over 5 strength of all 4 extremities. Good symmetric motor tone is noted throughout.  Sensory: Sensory testing is intact to pinprick, soft touch, vibration sensation, and position sense on the upper extremities.  With the lower extremities, there is a stocking pattern pinprick sensory deficit up to the knees bilaterally but position sensation both feet are relatively  intact.  Patient has decreased vibratory sensation in the right foot as compared to the left.  No evidence of extinction is noted.  Coordination: Cerebellar testing reveals good finger-nose-finger and heel-to-shin bilaterally.  Gait and station: Gait is minimally wide-based.  The patient is able to perform tandem gait with minimal instability.  Romberg is negative.  Reflexes: Deep tendon reflexes are symmetric, but are depressed bilaterally. Toes are downgoing bilaterally.   Assessment/Plan:  1.  Reports of right hand weakness, dropping things, episodic tremor  2.  Chronic gait instability  3.  Diabetes, diabetic peripheral neuropathy  The patient was sent over for evaluation of tremor but no tremor was seen in either upper extremity on clinical examination.  The patient reports that he does have some right hand tremor when he gets upset.  He reports that there has been a sudden change in functioning of the right hand since he went to the emergency room on 28 May 2018.  The patient does have risk factors for cerebrovascular disease.  MRI of the brain will be done, if the study is unrevealing, we may  consider EMG and nerve conduction study involving the arms.  [name] MD 07/25/2018 10:11 AM  [facility] [address] [place], [place] [address]  Phone [phone] Fax [phone]

## 2018-08-22 ENCOUNTER — Ambulatory Visit: Payer: Medicare HMO | Admitting: Podiatry

## 2018-08-22 ENCOUNTER — Other Ambulatory Visit: Payer: Self-pay

## 2018-08-22 ENCOUNTER — Encounter: Payer: Self-pay | Admitting: Podiatry

## 2018-08-22 VITALS — Temp 98.2°F

## 2018-08-22 DIAGNOSIS — B351 Tinea unguium: Secondary | ICD-10-CM

## 2018-08-22 DIAGNOSIS — E1142 Type 2 diabetes mellitus with diabetic polyneuropathy: Secondary | ICD-10-CM | POA: Diagnosis not present

## 2018-08-22 DIAGNOSIS — M79675 Pain in left toe(s): Secondary | ICD-10-CM

## 2018-08-22 DIAGNOSIS — M79674 Pain in right toe(s): Secondary | ICD-10-CM

## 2018-08-22 NOTE — Patient Instructions (Signed)
Diabetes Mellitus and Foot Care Foot care is an important part of your health, especially when you have diabetes. Diabetes may cause you to have problems because of poor blood flow (circulation) to your feet and legs, which can cause your skin to:  Become thinner and drier.  Break more easily.  Heal more slowly.  Peel and crack. You may also have nerve damage (neuropathy) in your legs and feet, causing decreased feeling in them. This means that you may not notice minor injuries to your feet that could lead to more serious problems. Noticing and addressing any potential problems early is the best way to prevent future foot problems. How to care for your feet Foot hygiene  Wash your feet daily with warm water and mild soap. Do not use hot water. Then, pat your feet and the areas between your toes until they are completely dry. Do not soak your feet as this can dry your skin.  Trim your toenails straight across. Do not dig under them or around the cuticle. File the edges of your nails with an emery board or nail file.  Apply a moisturizing lotion or petroleum jelly to the skin on your feet and to dry, brittle toenails. Use lotion that does not contain alcohol and is unscented. Do not apply lotion between your toes. Shoes and socks  Wear clean socks or stockings every day. Make sure they are not too tight. Do not wear knee-high stockings since they may decrease blood flow to your legs.  Wear shoes that fit properly and have enough cushioning. Always look in your shoes before you put them on to be sure there are no objects inside.  To break in new shoes, wear them for just a few hours a day. This prevents injuries on your feet. Wounds, scrapes, corns, and calluses  Check your feet daily for blisters, cuts, bruises, sores, and redness. If you cannot see the bottom of your feet, use a mirror or ask someone for help.  Do not cut corns or calluses or try to remove them with medicine.  If you  find a minor scrape, cut, or break in the skin on your feet, keep it and the skin around it clean and dry. You may clean these areas with mild soap and water. Do not clean the area with peroxide, alcohol, or iodine.  If you have a wound, scrape, corn, or callus on your foot, look at it several times a day to make sure it is healing and not infected. Check for: ? Redness, swelling, or pain. ? Fluid or blood. ? Warmth. ? Pus or a bad smell. General instructions  Do not cross your legs. This may decrease blood flow to your feet.  Do not use heating pads or hot water bottles on your feet. They may burn your skin. If you have lost feeling in your feet or legs, you may not know this is happening until it is too late.  Protect your feet from hot and cold by wearing shoes, such as at the beach or on hot pavement.  Schedule a complete foot exam at least once a year (annually) or more often if you have foot problems. If you have foot problems, report any cuts, sores, or bruises to your health care provider immediately. Contact a health care provider if:  You have a medical condition that increases your risk of infection and you have any cuts, sores, or bruises on your feet.  You have an injury that is not   healing.  You have redness on your legs or feet.  You feel burning or tingling in your legs or feet.  You have pain or cramps in your legs and feet.  Your legs or feet are numb.  Your feet always feel cold.  You have pain around a toenail. Get help right away if:  You have a wound, scrape, corn, or callus on your foot and: ? You have pain, swelling, or redness that gets worse. ? You have fluid or blood coming from the wound, scrape, corn, or callus. ? Your wound, scrape, corn, or callus feels warm to the touch. ? You have pus or a bad smell coming from the wound, scrape, corn, or callus. ? You have a fever. ? You have a red line going up your leg. Summary  Check your feet every day  for cuts, sores, red spots, swelling, and blisters.  Moisturize feet and legs daily.  Wear shoes that fit properly and have enough cushioning.  If you have foot problems, report any cuts, sores, or bruises to your health care provider immediately.  Schedule a complete foot exam at least once a year (annually) or more often if you have foot problems. This information is not intended to replace advice given to you by your health care provider. Make sure you discuss any questions you have with your health care provider. Document Released: 12/18/1999 Document Revised: 02/01/2017 Document Reviewed: 01/22/2016 Elsevier Patient Education  2020 [employer]   Onychomycosis/Fungal Toenails  WHAT IS IT? An infection that lies within the keratin of your nail plate that is caused by a fungus.  WHY ME? Fungal infections affect all ages, sexes, races, and creeds.  There may be many factors that predispose you to a fungal infection such as age, coexisting medical conditions such as diabetes, or an autoimmune disease; stress, medications, fatigue, genetics, etc.  Bottom line: fungus thrives in a warm, moist environment and your shoes offer such a location.  IS IT CONTAGIOUS? Theoretically, yes.  You do not want to share shoes, nail clippers or files with someone who has fungal toenails.  Walking around barefoot in the same room or sleeping in the same bed is unlikely to transfer the organism.  It is important to realize, however, that fungus can spread easily from one nail to the next on the same foot.  HOW DO WE TREAT THIS?  There are several ways to treat this condition.  Treatment may depend on many factors such as age, medications, pregnancy, liver and kidney conditions, etc.  It is best to ask your doctor which options are available to you.  1. No treatment.   Unlike many other medical concerns, you can live with this condition.  However for many people this can be a painful condition and may lead to  ingrown toenails or a bacterial infection.  It is recommended that you keep the nails cut short to help reduce the amount of fungal nail. 2. Topical treatment.  These range from herbal remedies to prescription strength nail lacquers.  About 40-50% effective, topicals require twice daily application for approximately 9 to 12 months or until an entirely new nail has grown out.  The most effective topicals are medical grade medications available through physicians offices. 3. Oral antifungal medications.  With an 80-90% cure rate, the most common oral medication requires 3 to 4 months of therapy and stays in your system for a year as the new nail grows out.  Oral antifungal medications do require   blood work to make sure it is a safe drug for you.  A liver function panel will be performed prior to starting the medication and after the first month of treatment.  It is important to have the blood work performed to avoid any harmful side effects.  In general, this medication safe but blood work is required. 4. Laser Therapy.  This treatment is performed by applying a specialized laser to the affected nail plate.  This therapy is noninvasive, fast, and non-painful.  It is not covered by insurance and is therefore, out of pocket.  The results have been very good with a 80-95% cure rate.  The [facility] is the only practice in the area to offer this therapy. 5. Permanent Nail Avulsion.  Removing the entire nail so that a new nail will not grow back. 

## 2018-08-30 NOTE — Progress Notes (Signed)
Subjective:  Duane Ortiz presents to clinic today with cc of  painful, thick, discolored, elongated toenails 1-5 b/l that become tender and cannot cut because of thickness. Pain is aggravated when wearing enclosed shoe gear.  He is on blood thinner Xarelto and also has diabetic neuropathy.    Current Outpatient Medications:  .  allopurinol (ZYLOPRIM) 100 MG tablet, Take 100 mg by mouth daily., Disp: , Rfl:  .  ALPRAZolam (XANAX) 0.5 MG tablet, Take 2 tablets approximately 45 minutes prior to the MRI study, take a third tablet if needed., Disp: 3 tablet, Rfl: 0 .  amLODipine (NORVASC) 10 MG tablet, Take 10 mg by mouth daily. , Disp: , Rfl:  .  ammonium lactate (LAC-HYDRIN) 12 % lotion, Apply to both legs twice daily., Disp: , Rfl:  .  baclofen (LIORESAL) 10 MG tablet, Take 10 mg by mouth 2 (two) times daily as needed for muscle spasms. , Disp: , Rfl:  .  clindamycin (CLEOCIN) 300 MG capsule, TK 1 C PO QID, Disp: , Rfl:  .  cloNIDine (CATAPRES) 0.1 MG tablet, Take 0.1 mg by mouth daily. , Disp: , Rfl:  .  furosemide (LASIX) 20 MG tablet, Take 20 mg by mouth daily. , Disp: , Rfl:  .  gabapentin (NEURONTIN) 300 MG capsule, Take 300 mg by mouth 2 (two) times daily. , Disp: , Rfl:  .  HYDROcodone-acetaminophen (NORCO) 7.5-325 MG tablet, Take 1 tablet by mouth 3 (three) times daily as needed for pain., Disp: , Rfl:  .  lidocaine (LIDODERM) 5 %, Place 1 patch onto the skin daily. Remove & Discard patch within 12 hours or as directed by MD, Disp: 30 patch, Rfl: 0 .  LOKELMA 10 g PACK packet, DIS THE CNTS IN WATER AND DRK PO TWICE A WEEK ON MON AND FRI, Disp: , Rfl:  .  nebivolol (BYSTOLIC) 10 MG tablet, Take 10 mg by mouth daily., Disp: , Rfl:  .  omeprazole (PRILOSEC) 20 MG capsule, , Disp: , Rfl:  .  pravastatin (PRAVACHOL) 40 MG tablet, Take 40 mg by mouth daily., Disp: , Rfl:  .  rivaroxaban (XARELTO) 20 MG TABS tablet, Take 20 mg by mouth every morning. , Disp: , Rfl:  .  sodium bicarbonate  650 MG tablet, Take 1,300 mg by mouth daily. , Disp: , Rfl:  .  spironolactone (ALDACTONE) 50 MG tablet, , Disp: , Rfl:  .  tamsulosin (FLOMAX) 0.4 MG CAPS capsule, Take 0.4 mg by mouth daily., Disp: , Rfl:  .  Vitamin D, Ergocalciferol, (DRISDOL) 1.25 MG (50000 UT) CAPS capsule, TK ONE C PO ONCE A WEEK, Disp: , Rfl:    Allergies  Allergen Reactions  . Sulfa Antibiotics Itching     Objective: Vitals:   08/22/18 1058  Temp: 98.2 F (36.8 C)    Physical Examination:  Vascular Examination: Capillary refill time immediate x 10 digits.  Palpable DP pulses b/l.   PT pulses faintly palpable b/l.  Digital hair absent b/l.  No edema noted b/l.  Skin temperature gradient WNL b/l.  Dermatological Examination: Skin with normal turgor, texture and tone b/l.  No open wounds b/l.  No interdigital macerations noted b/l.  Elongated, thick, discolored brittle toenails with subungual debris and pain on dorsal palpation of nailbeds 1-5 b/l.  Musculoskeletal Examination: Muscle strength 5/5 to all muscle groups b/l.  No pain, crepitus or joint discomfort with active/passive ROM.  Neurological Examination: Sensation diminished b/l with 10 gram monofilament.  Assessment: Mycotic nail infection  with pain 1-5 b/l NIDDM with neuropathy  Plan: 1. Toenails 1-5 b/l were debrided in length and girth without iatrogenic laceration. 2.  Continue soft, supportive shoe gear daily. 3.  Report any pedal injuries to medical professional. 4.  Follow up 3 months. 5.  Patient/POA to call should there be a question/concern in there interim.

## 2018-11-21 ENCOUNTER — Ambulatory Visit: Payer: Medicare HMO | Admitting: Podiatry

## 2018-11-23 ENCOUNTER — Other Ambulatory Visit: Payer: Self-pay | Admitting: Nurse Practitioner

## 2018-11-23 DIAGNOSIS — M47816 Spondylosis without myelopathy or radiculopathy, lumbar region: Secondary | ICD-10-CM

## 2018-12-06 ENCOUNTER — Telehealth: Payer: Self-pay | Admitting: Neurology

## 2018-12-06 NOTE — Telephone Encounter (Signed)
12/09/2018 MRI is of the lumbar spine ordered by Jeanella Anton NP. I called the pt and advised him to ask her for the medication to relax him for the MRI. He verbalized understanding and stated he has an appt with her tomorrow.

## 2018-12-06 NOTE — Telephone Encounter (Signed)
Pt is asking that something be called into [facility] [id number] to relax him for his upcoming MRI that is 12-09-18

## 2018-12-09 ENCOUNTER — Other Ambulatory Visit: Payer: Medicare HMO

## 2018-12-31 ENCOUNTER — Other Ambulatory Visit: Payer: Self-pay

## 2018-12-31 ENCOUNTER — Ambulatory Visit
Admission: RE | Admit: 2018-12-31 | Discharge: 2018-12-31 | Disposition: A | Discharge status: Home / Self Care | Payer: Medicare HMO | Source: Ambulatory Visit | Attending: Nurse Practitioner | Admitting: Nurse Practitioner

## 2018-12-31 DIAGNOSIS — M47816 Spondylosis without myelopathy or radiculopathy, lumbar region: Secondary | ICD-10-CM

## 2018-12-31 IMAGING — MR MR LUMBAR SPINE W/O CM
4 of 5 series · 18 of 48 positions shown · non-contrast
Comparison: [DATE]

CLINICAL DATA: Chronic low back pain and bilateral posterior leg
pain.

EXAM:
MRI LUMBAR SPINE WITHOUT CONTRAST
TECHNIQUE: Multiplanar, multisequence MR imaging of the lumbar spine was
performed. No intravenous contrast was administered.

[Series 5: T2 · sagittal · 4.0mm · 0.73mm/px · 5 of 15 slices shown (1 of 2)]
[im 1/15]
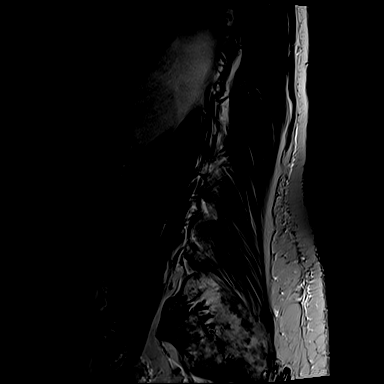
[im 4/15]
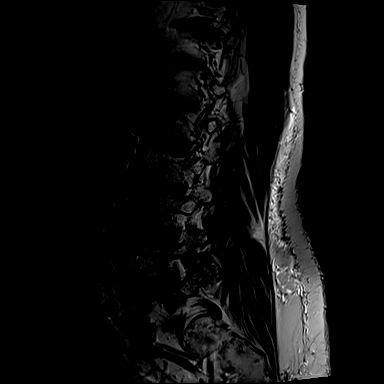
[im 8/15]
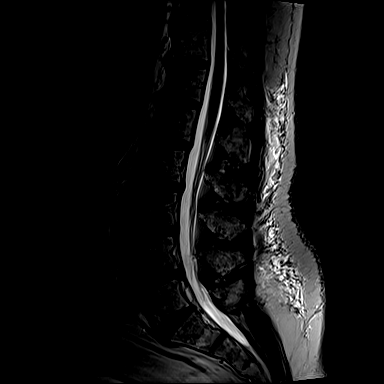
[im 11/15]
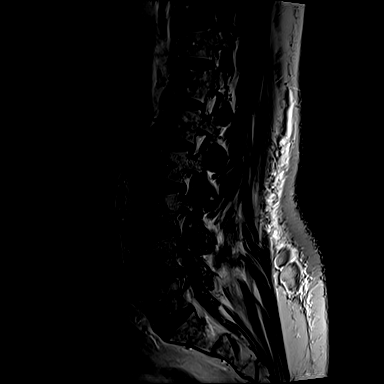
[im 15/15]
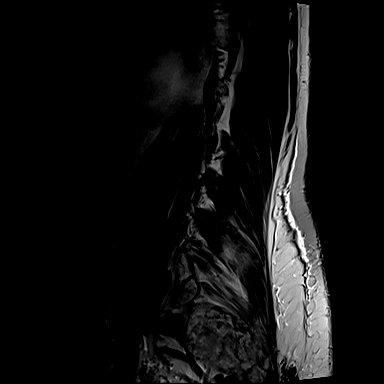

[Series 6: T1 · sagittal · 4.0mm · 0.73mm/px · 3 of 15 slices shown (1 of 2)]
[im 1/15]
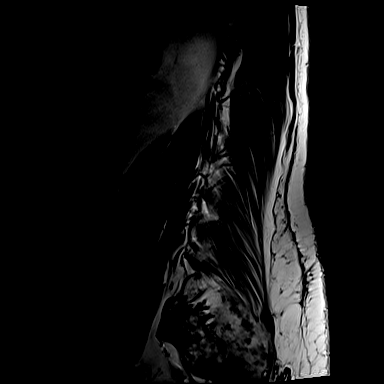
[im 8/15]
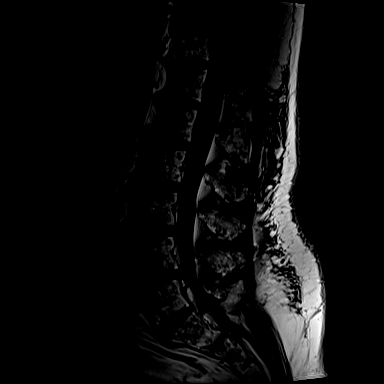
[im 15/15]
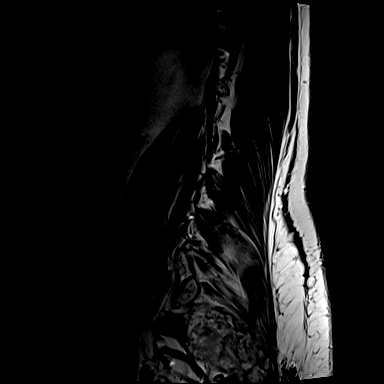

[Series 12: T2 · axial · 4.0mm · 0.28mm/px · z∈[-27,+148]mm · 7 of 42 slices shown (2 of 2)]
[im 3/42]
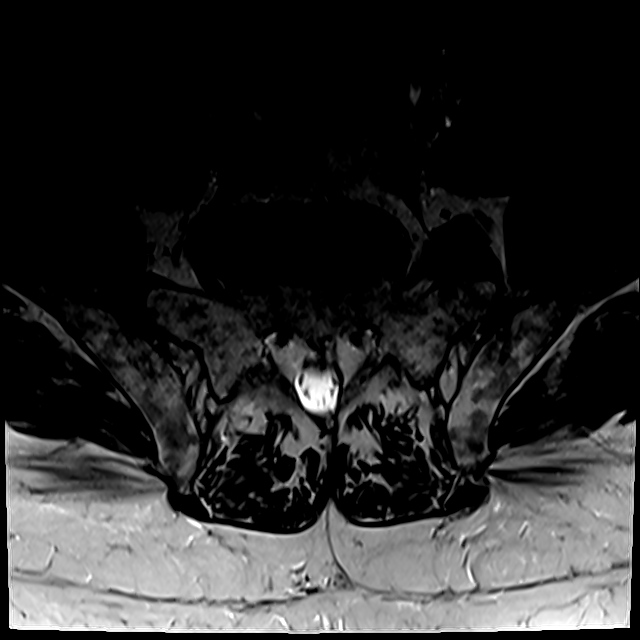
[im 6/42]
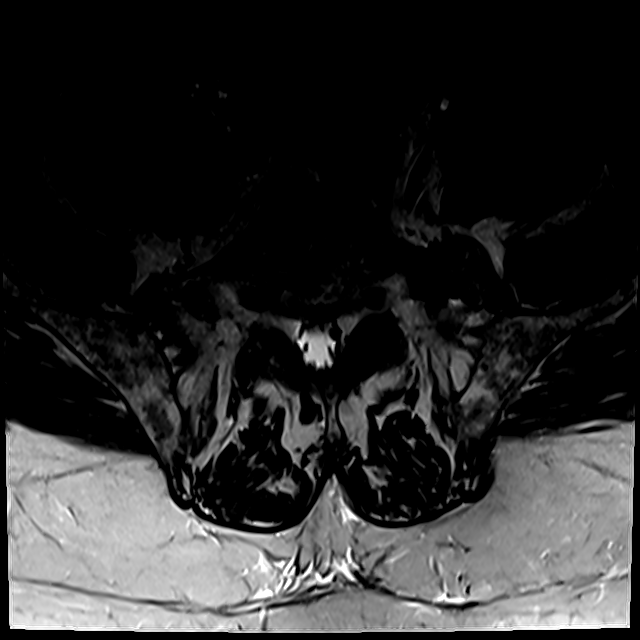
[im 9/42]
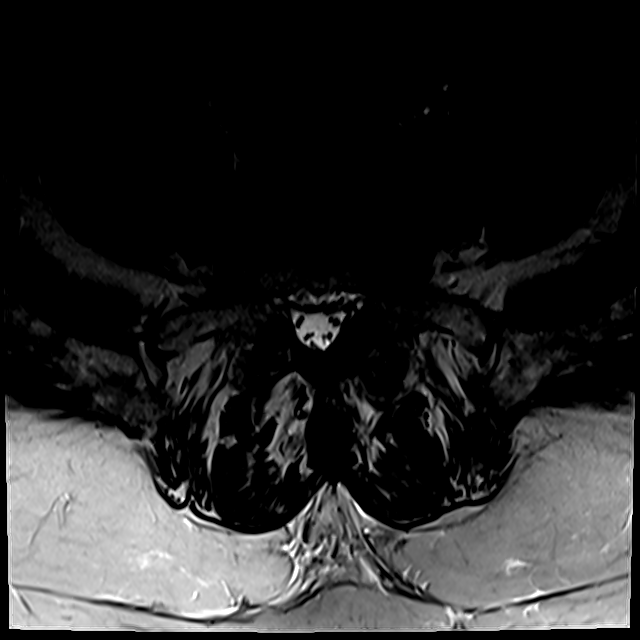
[im 14/42]
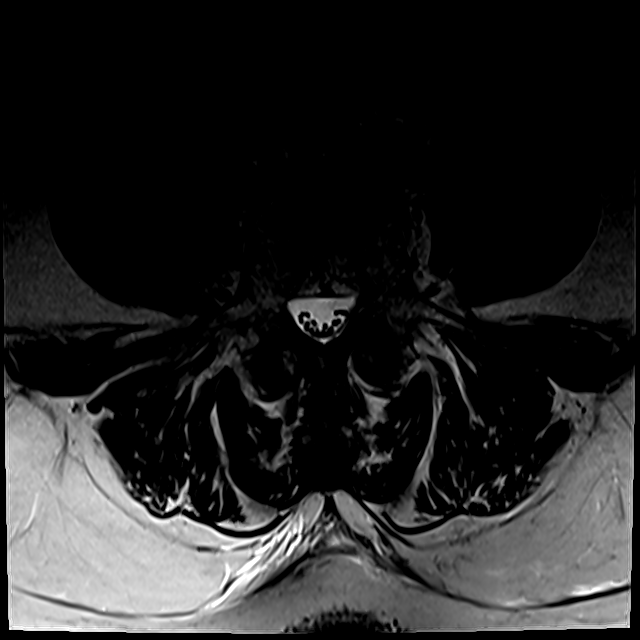
[im 20/42]
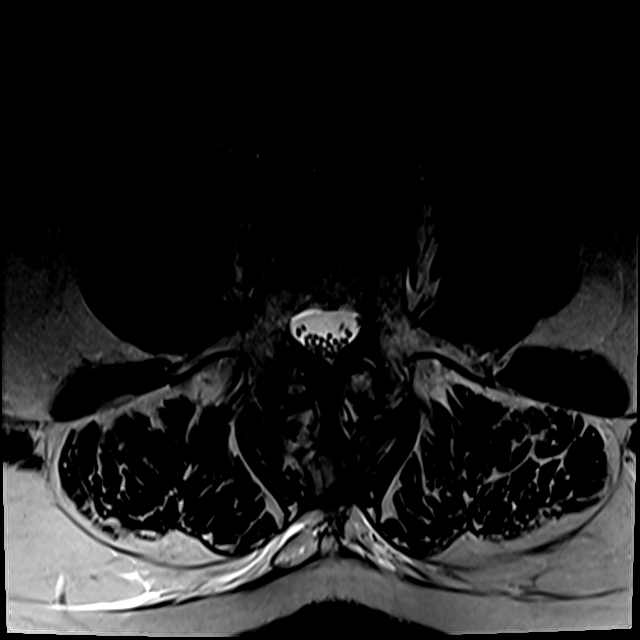
[im 22/42]
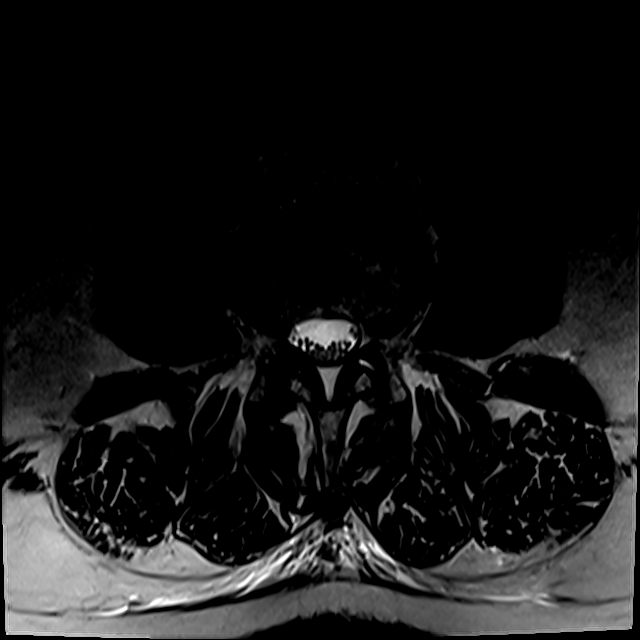
[im 36/42]
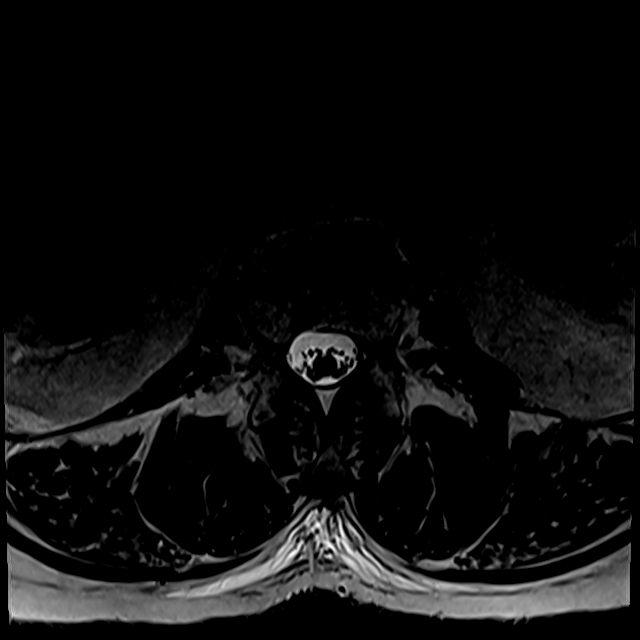

[Series 100: T1 · axial · 4.0mm · 0.28mm/px · z∈[-12,+148]mm · 3 of 42 slices shown (2 of 2)]
[im 6/42]
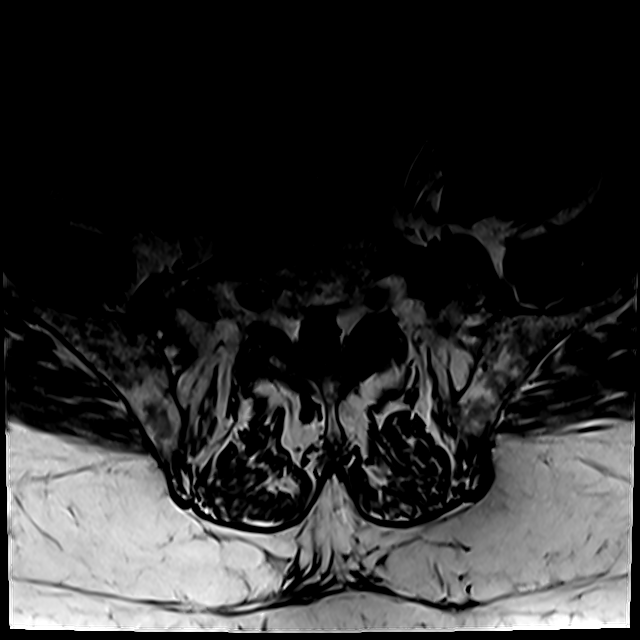
[im 22/42]
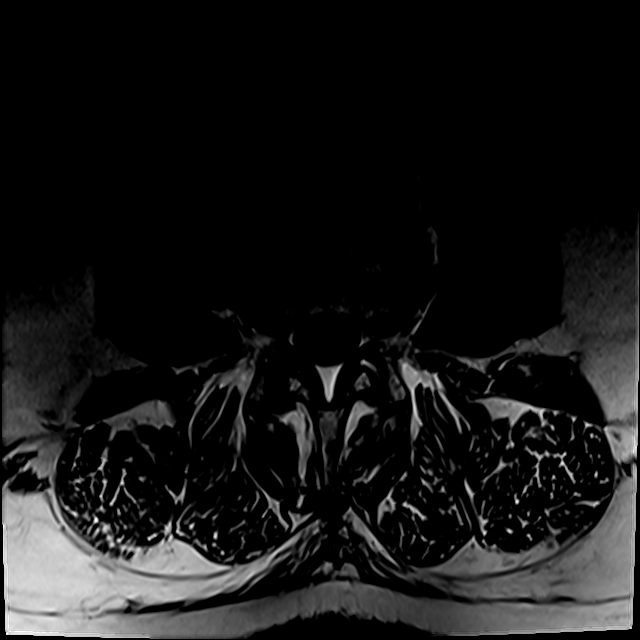
[im 36/42]
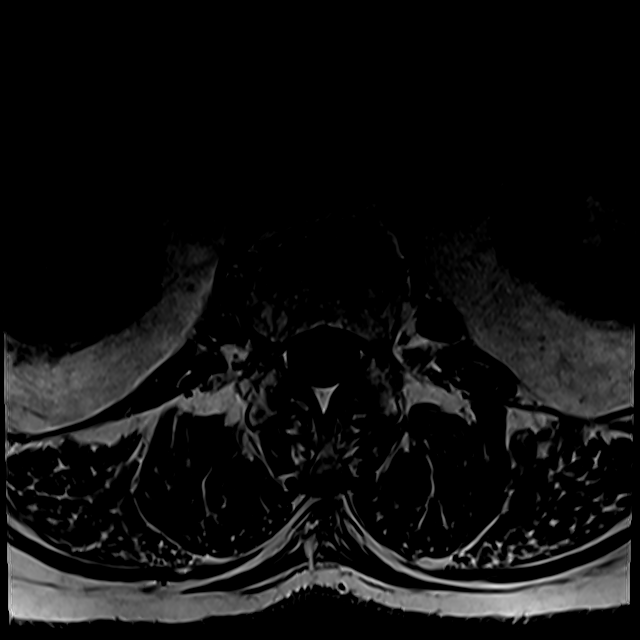

[18 of 48 positions shown; findings below may reference images not displayed]

FINDINGS: Segmentation: 5 lumbar type vertebrae based on prior. T12 ribs are
hypoplastic or absent based on interval CT.

Alignment:  Slight retrolisthesis at L4-5

Vertebrae: Stable heterogeneous marrow without fracture, evidence of
discitis, or bone lesion.

Conus medullaris and cauda equina: Conus extends to the L1-2 level.
Conus and cauda equina appear normal.

Paraspinal and other soft tissues: Negative

Disc levels:

T12- L1: Spondylitic spurring

L1-L2: Spondylosis.

L2-L3: Spondylosis and annulus bulging.

L3-L4: Spondylosis with bulky ventral spur BAT is not bridging based
on [DATE] CT. Mild annulus bulging and posterior element
hypertrophy

L4-L5: Spondylosis with disc bulging and prominent degenerative
posterior element hypertrophy. Ventral thecal sac flattening. No
compressive stenosis

L5-S1:Incomplete segmentation at the level of the transverse
processes with hypertrophy at the articulation on the left. No
significant degenerative change or impingement.
IMPRESSION: 1. Stable from [K2].
2. Generalized spondylosis with bulky endplate spurs. Facet
osteoarthritis at L3-4 and especially L4-5.
3. No high-grade stenosis or neural compression.

## 2019-01-02 ENCOUNTER — Telehealth: Payer: Self-pay

## 2019-01-02 NOTE — Telephone Encounter (Signed)
Called pt to cancel their appt 01/09/2019. Provider tested positive to Covid. Please r/s.

## 2019-01-09 ENCOUNTER — Institutional Professional Consult (permissible substitution): Payer: Medicare HMO | Admitting: Neurology

## 2019-01-15 ENCOUNTER — Encounter: Payer: Self-pay | Admitting: Neurology

## 2019-01-15 ENCOUNTER — Other Ambulatory Visit: Payer: Self-pay

## 2019-01-15 ENCOUNTER — Ambulatory Visit: Payer: Medicare HMO | Admitting: Neurology

## 2019-01-15 VITALS — BP 131/71 | HR 66 | Temp 97.5°F | Ht 72.0 in | Wt 241.0 lb

## 2019-01-15 DIAGNOSIS — R269 Unspecified abnormalities of gait and mobility: Secondary | ICD-10-CM

## 2019-01-15 DIAGNOSIS — M542 Cervicalgia: Secondary | ICD-10-CM | POA: Diagnosis not present

## 2019-01-15 DIAGNOSIS — M545 Low back pain, unspecified: Secondary | ICD-10-CM

## 2019-01-15 DIAGNOSIS — G8929 Other chronic pain: Secondary | ICD-10-CM | POA: Diagnosis not present

## 2019-01-15 MED ORDER — ALPRAZOLAM 0.5 MG PO TABS: ORAL_TABLET | ORAL | 0 refills | Status: DC

## 2019-01-15 NOTE — Progress Notes (Signed)
Reason for visit: Chronic low back pain, neck pain  Referring physician: Dr. Ann Held is a 69 y.o. male  History of present illness:  Mr. Cimo is a 69 year old right-handed black male with a history of prior cervical spine surgery done 1995.  He claims that since his neck surgery he has had ongoing problems with neck discomfort and stiffness.  He claims that when it gets cold outside his stiffness worsens.  More recently he has had some problems with hoarseness that may come and go.  He has also reported some pulsatile tinnitus affecting the right ear, a carotid Doppler study apparently was ordered for this.  The patient reports no pain or discomfort into the shoulders or down the arms but the pain is mainly centered around the neck associated with stiffness.  He also has had some significant issues with low back pain with some discomfort going down into the hips bilaterally, right greater than left, with weightbearing.  When he lies down, he may get some pain down both legs.  He claims that he got about 8 weeks of physical therapy which just recently ended.  The physical therapy helped transiently with his mobility of the neck but once the therapy stopped the stiffness ensued again.  The patient denies issues controlling the bowels or the bladder, he does note some problems with balance, he reports no recent falls.  He does feel somewhat weak in the arms and legs.  He has had a lot of swelling in the legs and has both legs wrapped below the knees.  He is on blood thinners for a pulmonary embolism.  He had carpal tunnel syndrome in 1985 as well on the right hand and has had decreased grip in that hand since that time.  He has a lot of shortness of breath with exertion at this time.  He is sent to this office for an evaluation.  Past Medical History:  Diagnosis Date  . Chronic kidney disease   . Chronic kidney disease   . Diabetes ([place]) 02/012017  . Fainting   . Gait abnormality  10/11/2016  . Gout   . Hypercholesteremia   . Hypercholesterolemia   . Hypertension   . Pulmonary embolism ([place])   . Shortness of breath     Past Surgical History:  Procedure Laterality Date  . CERVICAL DISCECTOMY    . COLONOSCOPY    . neck spine disk surgery  01/03/1993    Family History  Problem Relation Age of Onset  . Diabetes Mother   . Heart disease Mother   . Hypertension Mother   . Diabetes Sister   . Hypertension Sister   . Diabetes Brother   . Hypertension Brother   . Breast cancer Paternal Grandmother     Social history:  reports that he has been smoking cigarettes. He has been smoking about 0.25 packs per day. He has never used smokeless tobacco. He reports current alcohol use. He reports that he does not use drugs.  Medications:  Prior to Admission medications   Medication Sig Start Date End Date Taking? Authorizing Provider  allopurinol (ZYLOPRIM) 100 MG tablet Take 100 mg by mouth daily.   Yes [provider]  amLODipine (NORVASC) 10 MG tablet Take 10 mg by mouth daily.  04/11/18  Yes [provider]  ammonium lactate (LAC-HYDRIN) 12 % lotion Apply to both legs twice daily. 08/08/18  Yes [provider]  baclofen (LIORESAL) 10 MG tablet Take 10 mg by  mouth 2 (two) times daily as needed for muscle spasms.  04/27/18  Yes [provider]  clindamycin (CLEOCIN) 300 MG capsule TK 1 C PO QID 08/06/18  Yes [provider]  cloNIDine (CATAPRES) 0.1 MG tablet Take 0.1 mg by mouth daily.  04/20/18  Yes [provider]  furosemide (LASIX) 20 MG tablet Take 20 mg by mouth daily.  05/02/18  Yes [provider]  gabapentin (NEURONTIN) 300 MG capsule Take 300 mg by mouth 2 (two) times daily.  12/18/17  Yes [provider]  HYDROcodone-acetaminophen (NORCO) 7.5-325 MG tablet Take 1 tablet by mouth 3 (three) times daily as needed for pain. 05/24/18  Yes [provider]  lidocaine (LIDODERM) 5 % Place 1 patch  onto the skin daily. Remove & Discard patch within 12 hours or as directed by MD 05/28/18  Yes Nuala Alpha A, PA-C  LOKELMA 10 g PACK packet DIS THE CNTS IN WATER AND DRK PO TWICE A WEEK ON MON AND FRI 07/30/18  Yes [provider]  nebivolol (BYSTOLIC) 10 MG tablet Take 10 mg by mouth daily.   Yes [provider]  omeprazole (PRILOSEC) 20 MG capsule  08/02/18  Yes [provider]  pravastatin (PRAVACHOL) 40 MG tablet Take 40 mg by mouth daily.   Yes [provider]  rivaroxaban (XARELTO) 20 MG TABS tablet Take 20 mg by mouth every morning.    Yes [provider]  sodium bicarbonate 650 MG tablet Take 1,300 mg by mouth daily.    Yes [provider]  spironolactone (ALDACTONE) 50 MG tablet  08/02/18  Yes [provider]  tamsulosin (FLOMAX) 0.4 MG CAPS capsule Take 0.4 mg by mouth daily.   Yes [provider]  Vitamin D, Ergocalciferol, (DRISDOL) 1.25 MG (50000 UT) CAPS capsule TK ONE C PO ONCE A WEEK 08/02/18  Yes [provider]      Allergies  Allergen Reactions  . Sulfa Antibiotics Itching    ROS:  Out of a complete 14 system review of symptoms, the patient complains only of the following symptoms, and all other reviewed systems are negative.  Dyspnea on exertion Leg swelling Neck pain Low back pain Walking difficulty  Blood pressure 131/71, pulse 66, temperature (!) 97.5 F (36.4 C), temperature source Temporal, height 6' (1.829 m), weight 241 lb (109.3 kg).  Physical Exam  General: The patient is alert and cooperative at the time of the examination.  The patient is markedly obese.  Eyes: Pupils are equal, round, and reactive to light. Discs are flat bilaterally.  Neck: The neck is supple, no carotid bruits are noted.  Respiratory: The respiratory examination is clear.  Cardiovascular: The cardiovascular examination reveals a regular rate and rhythm, no obvious murmurs or rubs are  noted.  Neuromuscular: The patient has significant restriction of movement of the cervical spine, he can turn his head only about 10 degrees bilaterally.  Skin: Extremities are with 2-3+ edema below the knees bilaterally, the legs are currently wrapped.  Neurologic Exam  Mental status: The patient is alert and oriented x 3 at the time of the examination. The patient has apparent normal recent and remote memory, with an apparently normal attention span and concentration ability.  Cranial nerves: Facial symmetry is present. There is good sensation of the face to pinprick and soft touch bilaterally. The strength of the facial muscles and the muscles to head turning and shoulder shrug are normal bilaterally. Speech is well enunciated, no aphasia or dysarthria  is noted. Extraocular movements are full, with exception with superior gaze there is exotropia and incomplete superior deviation of the right eye. Visual fields are full. The tongue is midline, and the patient has symmetric elevation of the soft palate. No obvious hearing deficits are noted.  Motor: The motor testing reveals 5 over 5 strength of all 4 extremities, with exception of slight weakness with hip flexion bilaterally. Good symmetric motor tone is noted throughout.  Sensory: Sensory testing is intact to pinprick, soft touch, vibration sensation, and position sense on the upper extremities.  With the lower extremities, no definite pinprick sensory deficit could be determined because of the wrapping of the legs.  Significant reduction in vibration sensation is seen in both feet, position sense is preserved in both feet.  No evidence of extinction is noted.  Coordination: Cerebellar testing reveals good finger-nose-finger and heel-to-shin bilaterally.  Gait and station: Gait is wide-based, slightly unsteady.  The patient has negative Romberg, tandem gait was not attempted.  Reflexes: Deep tendon reflexes are symmetric, but are depressed  bilaterally. Toes are downgoing bilaterally.   MRI lumbar 12/31/18:  IMPRESSION: 1. Stable from 2018. 2. Generalized spondylosis with bulky endplate spurs. Facet osteoarthritis at L3-4 and especially L4-5. 3. No high-grade stenosis or neural compression.   * MRI scan images were reviewed online. I agree with the written report.    Assessment/Plan:  1.  Chronic neck pain  2.  Chronic low back pain  3.  Possible peripheral neuropathy by clinical examination   The patient is having a lot of discomfort in the spine and the neck and low back, he just recently completed physical therapy.  He claims that his neck issues have been present since 1995.  The patient will be set up for MRI of the cervical spine, recent MRI of the low back did not show any surgically amenable problems.  He is on gabapentin currently for his discomfort, he has hydrocodone.  He will follow-up here in 4 months.  [name] MD 01/15/2019 12:18 PM  [facility] [address] [place], [place] [address]  Phone [phone] Fax [phone]

## 2019-02-18 ENCOUNTER — Telehealth: Payer: Self-pay | Admitting: Neurology

## 2019-02-18 DIAGNOSIS — R269 Unspecified abnormalities of gait and mobility: Secondary | ICD-10-CM

## 2019-02-18 DIAGNOSIS — M47812 Spondylosis without myelopathy or radiculopathy, cervical region: Secondary | ICD-10-CM

## 2019-02-18 NOTE — Telephone Encounter (Signed)
I called the patient.  The patient does have some spinal stenosis at the C3-4 level, no definite spinal cord compression.  The patient has a large anterior spinal mass that may be a large osteophyte, this could be large enough to affect swallowing and speech, the patient denies any swallowing problems.  I will get a plain x-ray to further evaluate as recommended by the radiologist.  The patient claimed that he has been falling more frequently, he has reduced vibration sensation in both feet, we will get nerve conduction studies to evaluate for possible peripheral neuropathy.  Nerve conduction studies will be done on both legs, EMG on 1 leg.   MRI cervical 02/08/19:  Impression:  1.  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  There is a large area of T1 signal in the prevertebral region at C3-4 measuring 2.8 x 1.3 x 2.5 cm that may reflect large bridging osteophyte.  Correlation with plain films can be obtained to ensure this is of osseous origin and not a prevertebral mass.  2.  C2-3 mild central canal stenosis and moderate neuroforaminal narrowing.  3.  ACDF with well-maintained central canal at C5-6 and C6-7.  There is mild neuroforaminal narrowing at these levels.

## 2019-02-20 ENCOUNTER — Other Ambulatory Visit: Payer: Self-pay

## 2019-02-20 ENCOUNTER — Ambulatory Visit
Admission: RE | Admit: 2019-02-20 | Discharge: 2019-02-20 | Disposition: A | Discharge status: Home / Self Care | Payer: Medicare HMO | Source: Ambulatory Visit | Attending: Neurology | Admitting: Neurology

## 2019-02-20 DIAGNOSIS — M47812 Spondylosis without myelopathy or radiculopathy, cervical region: Secondary | ICD-10-CM

## 2019-02-20 IMAGING — CR DG CERVICAL SPINE 2 OR 3 VIEWS
3 series · 3 of 3 positions shown · non-contrast
Comparison: Radiograph [DATE]

CLINICAL DATA: Cervical spondylosis. Anterior spinal mass at C3-C4,
evaluate for bony osteophyte versus soft tissue mass.

EXAM:
CERVICAL SPINE - 2-3 VIEW

[w cervical spine lat]
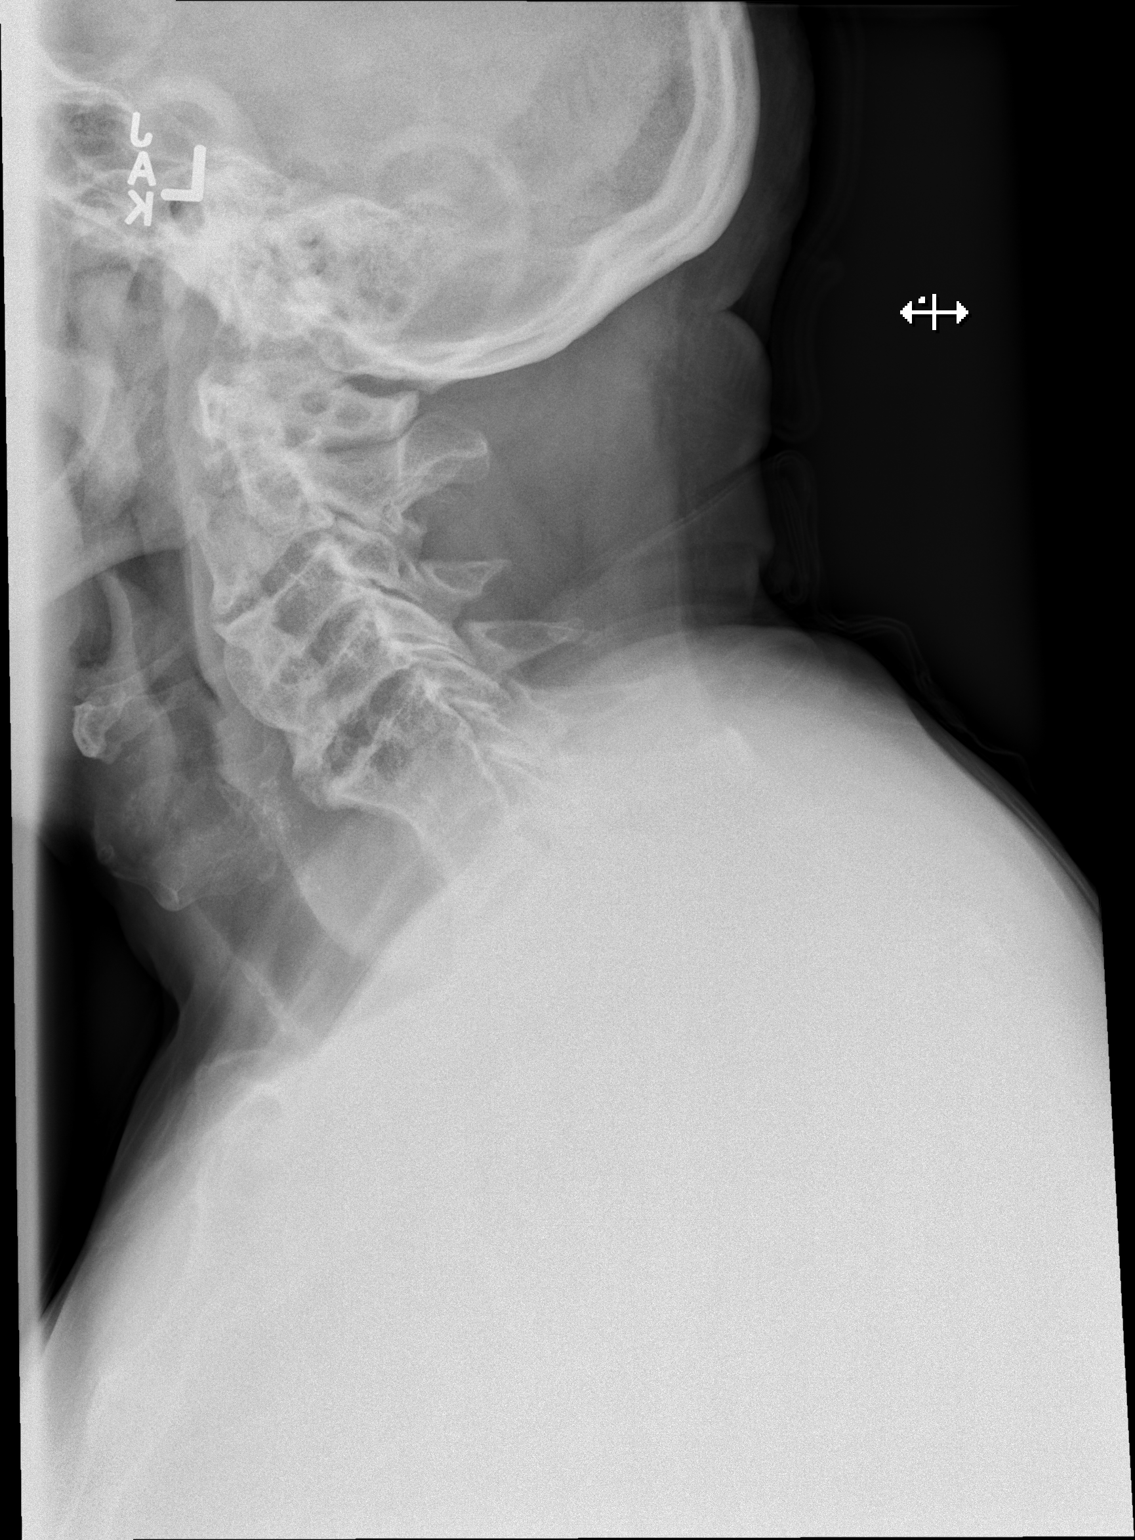

[w cervical spine ap]
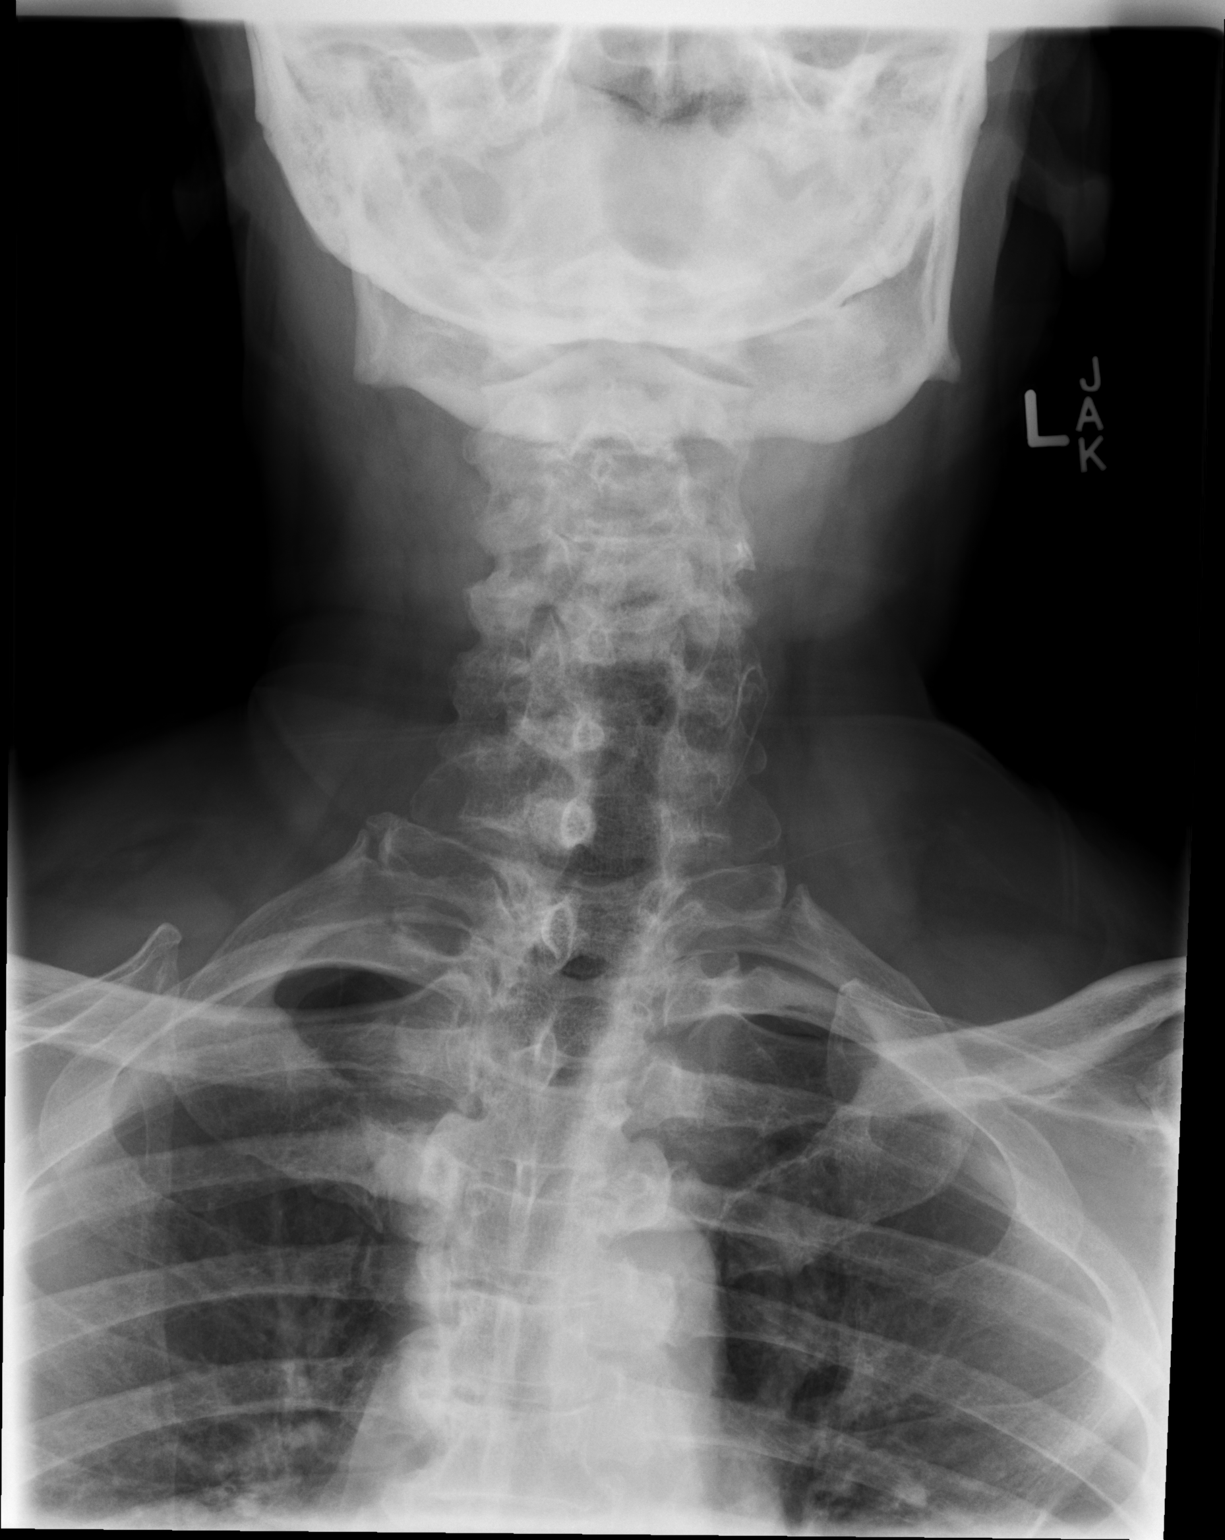

[w cervical swimmers]
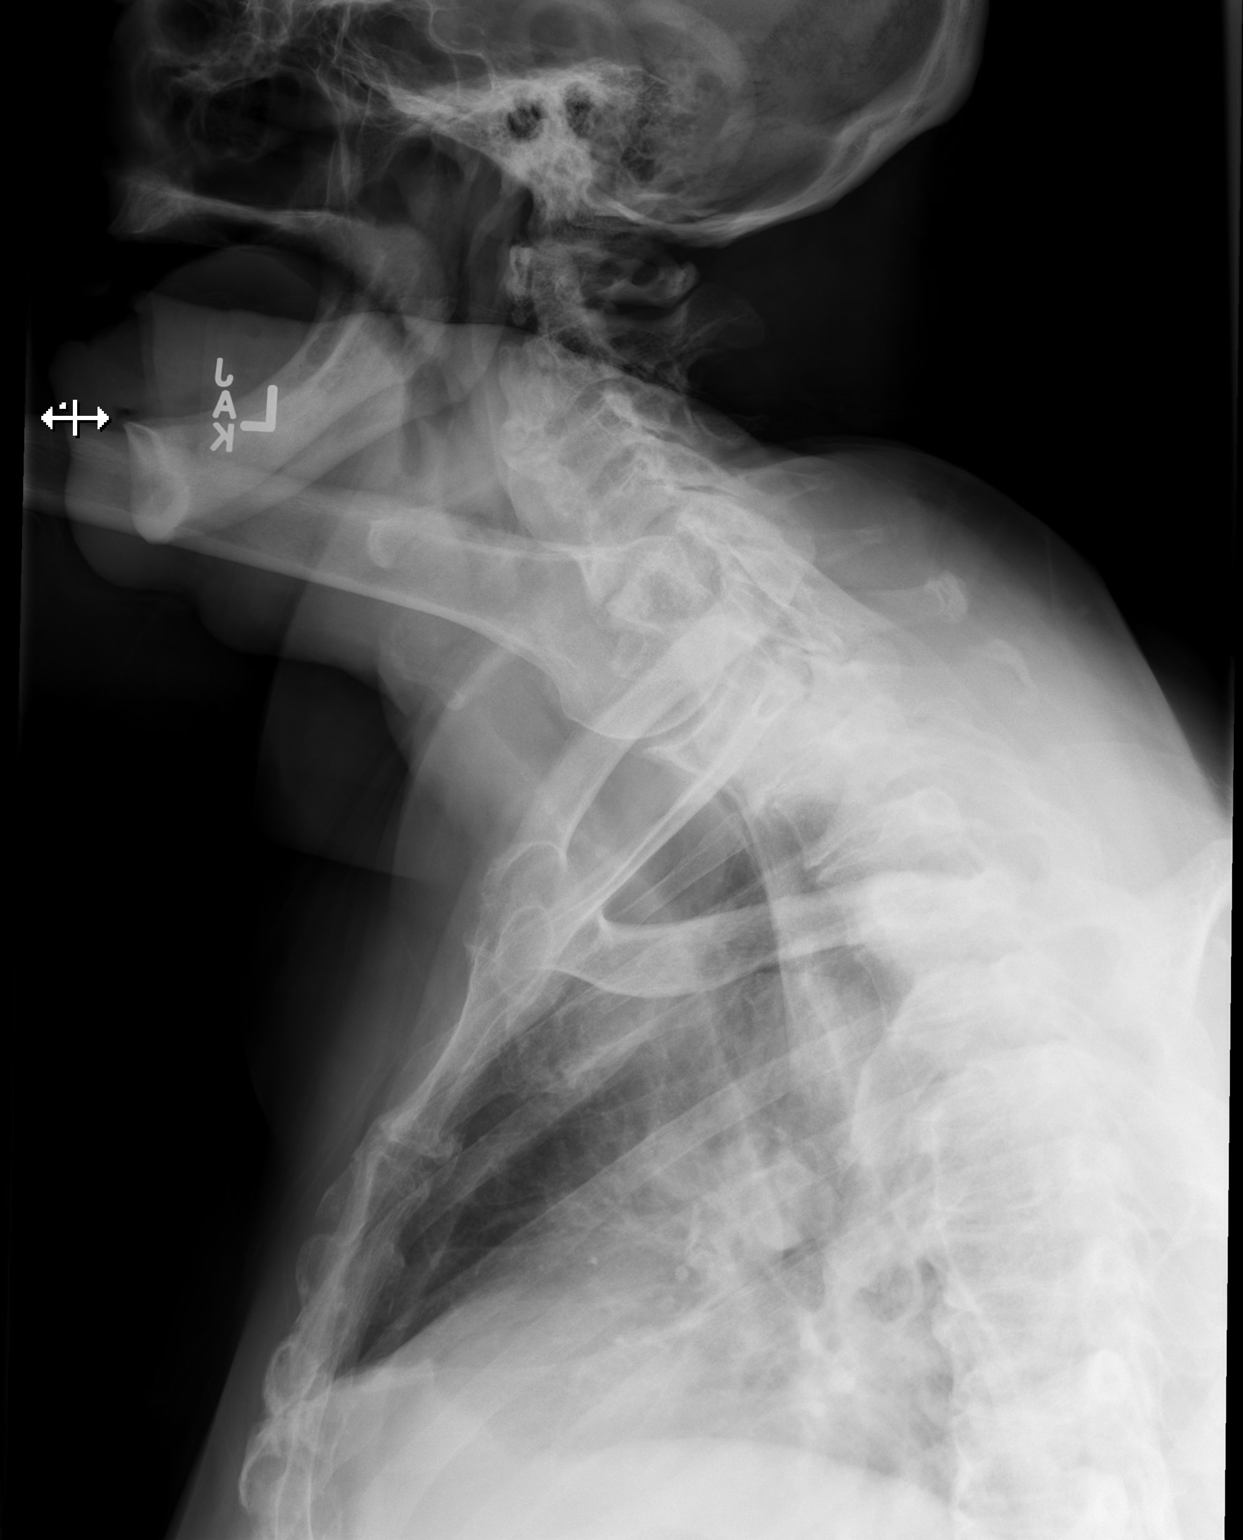

[3 of 3 positions shown; findings below may reference images not displayed]

FINDINGS: Bulky anterior osteophytes at C2-C3, C3-C4, and C4-C5 no significant
change from prior exam. Mild chronic flattening of C3 and C4
vertebral bodies. Small disc space at C5-C6 and C6-C[DATE] be
congenital or postsurgical. Cervicothoracic junction is not well
assessed given overlapping osseous and soft tissue structures. No
evidence of fracture. No radiographic evidence of soft tissue mass
or prevertebral soft tissue edema.
IMPRESSION: 1. Stable radiographic appearance of the cervical spine with bulky
anterior osteophytes at C2-C3, C3-C4, and C4-C5.
2. No radiographic evidence of soft tissue mass.

## 2019-02-21 ENCOUNTER — Telehealth: Payer: Self-pay | Admitting: Neurology

## 2019-02-21 NOTE — Telephone Encounter (Signed)
  I called the patient.  The x-ray of the neck confirms an osteophyte, no soft tissue mass at the C3-4 level.  XR cervical 02/21/19:  FINDINGS: Bulky anterior osteophytes at C2-C3, C3-C4, and C4-C5 no significant change from prior exam. Mild chronic flattening of C3 and C4 vertebral bodies. Small disc space at C5-C6 and C6-C7 may be congenital or postsurgical. Cervicothoracic junction is not well assessed given overlapping osseous and soft tissue structures. No evidence of fracture. No radiographic evidence of soft tissue mass or prevertebral soft tissue edema.  IMPRESSION: 1. Stable radiographic appearance of the cervical spine with bulky anterior osteophytes at C2-C3, C3-C4, and C4-C5. 2. No radiographic evidence of soft tissue mass.

## 2019-03-01 ENCOUNTER — Telehealth (HOSPITAL_COMMUNITY): Payer: Self-pay

## 2019-03-01 NOTE — Telephone Encounter (Signed)

## 2019-03-04 ENCOUNTER — Other Ambulatory Visit: Payer: Self-pay

## 2019-03-04 ENCOUNTER — Ambulatory Visit (INDEPENDENT_AMBULATORY_CARE_PROVIDER_SITE_OTHER)
Admission: RE | Admit: 2019-03-04 | Discharge: 2019-03-04 | Disposition: A | Discharge status: Home / Self Care | Payer: Medicare HMO | Source: Ambulatory Visit | Attending: Surgery | Admitting: Surgery

## 2019-03-04 ENCOUNTER — Encounter: Payer: Self-pay | Admitting: Surgery

## 2019-03-04 ENCOUNTER — Ambulatory Visit (INDEPENDENT_AMBULATORY_CARE_PROVIDER_SITE_OTHER): Payer: Medicare HMO | Admitting: Surgery

## 2019-03-04 ENCOUNTER — Ambulatory Visit (HOSPITAL_COMMUNITY)
Admission: RE | Admit: 2019-03-04 | Discharge: 2019-03-04 | Disposition: A | Discharge status: Home / Self Care | Payer: Medicare HMO | Source: Ambulatory Visit | Attending: Surgery | Admitting: Surgery

## 2019-03-04 ENCOUNTER — Institutional Professional Consult (permissible substitution): Payer: Medicare HMO | Admitting: Neurology

## 2019-03-04 VITALS — BP 181/84 | HR 68 | Temp 97.5°F | Resp 20 | Ht 72.0 in | Wt 232.0 lb

## 2019-03-04 DIAGNOSIS — N184 Chronic kidney disease, stage 4 (severe): Secondary | ICD-10-CM

## 2019-03-04 DIAGNOSIS — I129 Hypertensive chronic kidney disease with stage 1 through stage 4 chronic kidney disease, or unspecified chronic kidney disease: Secondary | ICD-10-CM | POA: Diagnosis not present

## 2019-03-04 NOTE — Progress Notes (Signed)
Vascular and Vein Specialist of [facility]  Patient name: Duane Ortiz MRN: [id number] DOB: 12-06-1950 Sex: male   REQUESTING PROVIDER:    Dr. Willene Hatchet   REASON FOR CONSULT:    Dialysis access  HISTORY OF PRESENT ILLNESS:   Wwlliam Ortiz is a 69 y.o. male, who is who is referred for dialysis access.  The patient is right-handed.  He is not yet on dialysis.  Patient is medically managed for hypertension.  He has a history of pulmonary embolism.  He is a diabetic.  He is a former smoker  PAST MEDICAL HISTORY    Past Medical History:  Diagnosis Date  . Chronic kidney disease   . Chronic kidney disease   . Diabetes ([place]) 02/012017  . Fainting   . Gait abnormality 10/11/2016  . Gout   . Hypercholesteremia   . Hypercholesterolemia   . Hypertension   . Pulmonary embolism ([place])   . Shortness of breath      FAMILY HISTORY   Family History  Problem Relation Age of Onset  . Diabetes Mother   . Heart disease Mother   . Hypertension Mother   . Diabetes Sister   . Hypertension Sister   . Diabetes Brother   . Hypertension Brother   . Breast cancer Paternal Grandmother     SOCIAL HISTORY:   Social History   Socioeconomic History  . Marital status: Divorced    Spouse name: Not on file  . Number of children: 3  . Years of education: 10  . Highest education level: Not on file  Occupational History  . Not on file  Tobacco Use  . Smoking status: Former Smoker    Packs/day: 0.25    Types: Cigarettes  . Smokeless tobacco: Never Used  . Tobacco comment: 1 packs per week  Substance and Sexual Activity  . Alcohol use: Yes    Alcohol/week: 0.0 standard drinks  . Drug use: No  . Sexual activity: Not on file  Other Topics Concern  . Not on file  Social History Narrative   Lives alone   Caffeine use: Sometimes   Right handed    Social Determinants of Health   Financial Resource Strain:   . Difficulty of Paying Living  Expenses: Not on file  Food Insecurity:   . Worried About Charity fundraiser in the Last Year: Not on file  . Ran Out of Food in the Last Year: Not on file  Transportation Needs:   . Lack of Transportation (Medical): Not on file  . Lack of Transportation (Non-Medical): Not on file  Physical Activity:   . Days of Exercise per Week: Not on file  . Minutes of Exercise per Session: Not on file  Stress:   . Feeling of Stress : Not on file  Social Connections:   . Frequency of Communication with Friends and Family: Not on file  . Frequency of Social Gatherings with Friends and Family: Not on file  . Attends Religious Services: Not on file  . Active Member of Clubs or Organizations: Not on file  . Attends Archivist Meetings: Not on file  . Marital Status: Not on file  Intimate Partner Violence:   . Fear of Current or Ex-Partner: Not on file  . Emotionally Abused: Not on file  . Physically Abused: Not on file  . Sexually Abused: Not on file    ALLERGIES:    Allergies  Allergen Reactions  . Sulfa Antibiotics Itching    CURRENT  MEDICATIONS:    Current Outpatient Medications  Medication Sig Dispense Refill  . allopurinol (ZYLOPRIM) 100 MG tablet Take 100 mg by mouth daily.    [name] ALPRAZolam (XANAX) 0.5 MG tablet Take 2 tablets approximately 45 minutes prior to the MRI study, take a third tablet if needed. 3 tablet 0  . amLODipine (NORVASC) 10 MG tablet Take 10 mg by mouth daily.     [name] ammonium lactate (LAC-HYDRIN) 12 % lotion Apply to both legs twice daily.    . baclofen (LIORESAL) 10 MG tablet Take 10 mg by mouth 2 (two) times daily as needed for muscle spasms.     . calcitRIOL (ROCALTROL) 0.25 MCG capsule Take 0.25 mcg by mouth daily.    . cloNIDine (CATAPRES) 0.1 MG tablet Take 0.1 mg by mouth daily.     . furosemide (LASIX) 20 MG tablet Take 20 mg by mouth daily.     [name] gabapentin (NEURONTIN) 300 MG capsule Take 300 mg by mouth 2 (two) times daily.     [name]  HYDROcodone-acetaminophen (NORCO) 7.5-325 MG tablet Take 1 tablet by mouth 3 (three) times daily as needed for pain.    [name] lidocaine (LIDODERM) 5 % Place 1 patch onto the skin daily. Remove & Discard patch within 12 hours or as directed by MD 30 patch 0  . LOKELMA 10 g PACK packet DIS THE CNTS IN WATER AND DRK PO TWICE A WEEK ON MON AND FRI    . nebivolol (BYSTOLIC) 10 MG tablet Take 10 mg by mouth daily.    [name] omeprazole (PRILOSEC) 20 MG capsule     . pravastatin (PRAVACHOL) 40 MG tablet Take 40 mg by mouth daily.    . rivaroxaban (XARELTO) 20 MG TABS tablet Take 20 mg by mouth every morning.     . sodium bicarbonate 650 MG tablet Take 1,300 mg by mouth daily.     [name] spironolactone (ALDACTONE) 50 MG tablet     . tamsulosin (FLOMAX) 0.4 MG CAPS capsule Take 0.4 mg by mouth daily.    . Vitamin D, Ergocalciferol, (DRISDOL) 1.25 MG (50000 UT) CAPS capsule TK ONE C PO ONCE A WEEK     No current facility-administered medications for this visit.    REVIEW OF SYSTEMS:   [X]  denotes positive finding, [ ]  denotes negative finding Cardiac  Comments:  Chest pain or chest pressure:    Shortness of breath upon exertion: x   Short of breath when lying flat: x   Irregular heart rhythm:        Vascular    Pain in calf, thigh, or hip brought on by ambulation: x   Pain in feet at night that wakes you up from your sleep:  x   Blood clot in your veins:    Leg swelling:  x       Pulmonary    Oxygen at home:    Productive cough:     Wheezing:         Neurologic    Sudden weakness in arms or legs:     Sudden numbness in arms or legs:     Sudden onset of difficulty speaking or slurred speech:    Temporary loss of vision in one eye:     Problems with dizziness:         Gastrointestinal    Blood in stool:      Vomited blood:         Genitourinary    Burning when urinating:  Blood in urine:        Psychiatric    Major depression:         Hematologic    Bleeding problems:    Problems with  blood clotting too easily:        Skin    Rashes or ulcers:        Constitutional    Fever or chills:     PHYSICAL EXAM:   Vitals:   03/04/19 0934  BP: (!) 181/84  Pulse: 68  Resp: 20  Temp: (!) 97.5 F (36.4 C)  SpO2: 98%  Weight: 232 lb (105.2 kg)  Height: 6' (1.829 m)    GENERAL: The patient is a well-nourished male, in no acute distress. The vital signs are documented above. CARDIAC: There is a regular rate and rhythm.  VASCULAR: Palpable radial pulses PULMONARY: Nonlabored respirations MUSCULOSKELETAL: There are no major deformities or cyanosis. NEUROLOGIC: No focal weakness or paresthesias are detected. SKIN: There are no ulcers or rashes noted. PSYCHIATRIC: The patient has a normal affect.  STUDIES:   I have ordered and reviewed the following: Arterial: Right: Patent brachial, radial, and ulnar arteries.  Left: Patent brachial, radial, and ulnar arteries.   Right Cephalic  Diameter (cm)Depth (cm)Findings   +-----------------+-------------+----------+---------+  Shoulder       0.19               +-----------------+-------------+----------+---------+  Prox upper arm    0.18               +-----------------+-------------+----------+---------+  Mid upper arm    0.20               +-----------------+-------------+----------+---------+  Dist upper arm    0.26               +-----------------+-------------+----------+---------+  Antecubital fossa  0.19        branching  +-----------------+-------------+----------+---------+  Prox forearm     0.20               +-----------------+-------------+----------+---------+  Mid forearm     0.18        branching  +-----------------+-------------+----------+---------+  Dist forearm     0.20               +-----------------+-------------+----------+---------+    +-----------------+-------------+----------+--------------+  Right Basilic  Diameter (cm)Depth (cm)  Findings    +-----------------+-------------+----------+--------------+  Prox upper arm    0.27                 +-----------------+-------------+----------+--------------+  Mid upper arm              not visualized  +-----------------+-------------+----------+--------------+  Dist upper arm              not visualized  +-----------------+-------------+----------+--------------+  Antecubital fossa  0.13                 +-----------------+-------------+----------+--------------+   +-----------------+-------------+----------+---------+  Left Cephalic  Diameter (cm)Depth (cm)Findings   +-----------------+-------------+----------+---------+  Shoulder       0.21               +-----------------+-------------+----------+---------+  Prox upper arm    0.15               +-----------------+-------------+----------+---------+  Mid upper arm    0.20               +-----------------+-------------+----------+---------+  Dist upper arm    0.18               +-----------------+-------------+----------+---------+  Antecubital fossa  0.30        branching  +-----------------+-------------+----------+---------+  Prox forearm     0.14               +-----------------+-------------+----------+---------+  Mid forearm     0.13               +-----------------+-------------+----------+---------+  Dist forearm     0.17               +-----------------+-------------+----------+---------+   +-----------------+-------------+----------+---------+  Left Basilic   Diameter (cm)Depth (cm)Findings   +-----------------+-------------+----------+---------+  Prox  upper arm    0.25               +-----------------+-------------+----------+---------+  Mid upper arm    0.22               +-----------------+-------------+----------+---------+  Dist upper arm    0.26        branching  +-----------------+-------------+----------+---------+  Antecubital fossa  0.24               +-----------------+-------------+----------+---------+  ASSESSMENT and PLAN   Chronic renal insufficiency: The patient is not yet on dialysis.  His vein mapping shows small surface veins.  I discussed that I think it would be reasonable to explore his left basilic vein to see if I can get a basilic vein fistula to mature  We discussed that if his vein does develop he would need a second operation.  He also understands that given the size of his veins that the fistula may not mature.  He is not yet ready to have a graft placed, and so that would not be done at this operation.  He is going to talk to his nephrologist and get back to me regarding scheduling his procedure.  He is on Xarelto for history of DVT.  According the patient his physicians have contemplated stopping this several times in the past.  Therefore I will not bridge him with Lovenox.   Leia Alf, MD, FACS Vascular and Vein Specialists of [facility] [phone] Pager [phone]

## 2019-04-02 ENCOUNTER — Encounter: Payer: Self-pay | Admitting: Neurology

## 2019-04-02 ENCOUNTER — Other Ambulatory Visit: Payer: Self-pay

## 2019-04-02 ENCOUNTER — Ambulatory Visit (INDEPENDENT_AMBULATORY_CARE_PROVIDER_SITE_OTHER): Payer: Medicare HMO | Admitting: Neurology

## 2019-04-02 ENCOUNTER — Ambulatory Visit: Payer: Medicare HMO | Admitting: Neurology

## 2019-04-02 DIAGNOSIS — R269 Unspecified abnormalities of gait and mobility: Secondary | ICD-10-CM

## 2019-04-02 DIAGNOSIS — G603 Idiopathic progressive neuropathy: Secondary | ICD-10-CM

## 2019-04-02 DIAGNOSIS — R2681 Unsteadiness on feet: Secondary | ICD-10-CM

## 2019-04-02 DIAGNOSIS — G629 Polyneuropathy, unspecified: Secondary | ICD-10-CM

## 2019-04-02 DIAGNOSIS — E538 Deficiency of other specified B group vitamins: Secondary | ICD-10-CM

## 2019-04-02 HISTORY — DX: Polyneuropathy, unspecified: G62.9

## 2019-04-02 NOTE — Progress Notes (Signed)
Please refer to EMG and nerve conduction procedure note.  

## 2019-04-02 NOTE — Procedures (Signed)
     HISTORY:  Duane Ortiz is a 69 year old gentleman with a history of numbness in the feet and a history of chronic low back pain. He has reported some increasing problems with gait instability with occasional falls. He is using a cane for ambulation. He is being evaluated for a possible peripheral neuropathy.  NERVE CONDUCTION STUDIES:  Nerve conduction studies were performed on both lower extremities. No response was seen for the right peroneal nerve. The distal motor latencies for the left peroneal nerve and for the posterior tibial nerves bilaterally were normal with low motor amplitudes for these nerves. Slowing was seen for the left peroneal nerve, normal nerve conduction velocities were seen for the posterior tibial nerves bilaterally. No response was seen for the sural or peroneal sensory latencies on either side. The F-wave latencies for the posterior tibial nerves were unobtainable bilaterally.   EMG STUDIES:  EMG study was performed on the right lower extremity:  The tibialis anterior muscle reveals 2 to 4K motor units with slightly reduced recruitment. No fibrillations or positive waves were seen. The peroneus tertius muscle reveals 2 to 4K motor units with slightly reduced recruitment. No fibrillations or positive waves were seen. The medial gastrocnemius muscle reveals 1 to 3K motor units with full recruitment. No fibrillations or positive waves were seen. The vastus lateralis muscle reveals 2 to 4K motor units with full recruitment. No fibrillations or positive waves were seen. The iliopsoas muscle reveals 2 to 4K motor units with full recruitment. No fibrillations or positive waves were seen. The biceps femoris muscle (long head) reveals 2 to 4K motor units with full recruitment. No fibrillations or positive waves were seen. The lumbosacral paraspinal muscles were tested at 3 levels, and revealed no abnormalities of insertional activity at all 3 levels tested. There was good  relaxation.   IMPRESSION:  Nerve conduction studies done on both lower extremities shows evidence of a primarily axonal peripheral neuropathy of moderate severity. EMG evaluation of the right lower extremity shows mild chronic stable distal signs of neuropathic denervation consistent with a peripheral neuropathy, there is no clear evidence of an overlying lumbosacral radiculopathy.  [name] MD 04/02/2019 2:17 PM  [facility] [address] [place], [place] [address]  Phone [phone] Fax [phone]

## 2019-04-02 NOTE — Progress Notes (Addendum)
The patient comes in today for EMG and nerve conduction study evaluation. This shows evidence of a peripheral neuropathy of moderate severity. The patient will be sent for blood work looking for various etiologies of neuropathy. This is the likely explanation for some of his numbness in the feet and gait instability. No evidence of an overlying lumbosacral radiculopathy was seen.       [place]    Nerve / Sites Muscle Latency Ref. Amplitude Ref. Rel Amp Segments Distance Velocity Ref. Area    ms ms mV mV %  cm m/s m/s mVms  R Peroneal - EDB     Ankle EDB NR ?6.5 NR ?2.0 NR Ankle - EDB 9   NR     Fib head EDB 12.2  0.8   Fib head - Ankle 31 NR ?44 2.0     Pop fossa EDB 15.0  0.7  86.3 Pop fossa - Fib head 10 36 ?44 1.8         Pop fossa - Ankle      L Peroneal - EDB     Ankle EDB 4.8 ?6.5 0.7 ?2.0 100 Ankle - EDB 9   1.7     Fib head EDB 12.9  0.4  49.7 Fib head - Ankle 30 37 ?44 1.0     Pop fossa EDB 15.8  0.4  108 Pop fossa - Fib head 10 35 ?44 1.0         Pop fossa - Ankle      R Tibial - AH     Ankle AH 5.8 ?5.8 0.3 ?4.0 100 Ankle - AH 9   1.1     Pop fossa AH 15.5  0.2  60.3 Pop fossa - Ankle 42 44 ?41 0.8  L Tibial - AH     Ankle AH 5.8 ?5.8 0.4 ?4.0 100 Ankle - AH 9   1.3     Pop fossa AH 13.8  0.2  39.6 Pop fossa - Ankle 41 51 ?41 0.5             SNC    Nerve / Sites Rec. Site Peak Lat Ref.  Amp Ref. Segments Distance    ms ms V V  cm  R Sural - Ankle (Calf)     Calf Ankle NR ?4.4 NR ?6 Calf - Ankle 14  L Sural - Ankle (Calf)     Calf Ankle NR ?4.4 NR ?6 Calf - Ankle 14  R Superficial peroneal - Ankle     Lat leg Ankle NR ?4.4 NR ?6 Lat leg - Ankle 14  L Superficial peroneal - Ankle     Lat leg Ankle NR ?4.4 NR ?6 Lat leg - Ankle 14             F  Wave    Nerve F Lat Ref.   ms ms  R Tibial - AH NR ?56.0  L Tibial - AH NR ?56.0

## 2019-04-05 LAB — ENA+DNA/DS+SJORGEN'S
ENA RNP Ab: 0.3 AI (ref 0.0–0.9)
ENA SM Ab Ser-aCnc: <0.2 AI (ref 0.0–0.9)
ENA SSA (RO) Ab: <0.2 AI (ref 0.0–0.9)
ENA SSB (LA) Ab: <0.2 AI (ref 0.0–0.9)
dsDNA Ab: 1 IU/mL (ref 0–9)

## 2019-04-05 LAB — B. BURGDORFI ANTIBODIES: Lyme IgG/IgM Ab: <0.91 {ISR} (ref 0.00–0.90)

## 2019-04-05 LAB — MULTIPLE MYELOMA PANEL, SERUM
Albumin SerPl Elph-Mcnc: 3.1 g/dL (ref 2.9–4.4)
Albumin/Glob SerPl: 1.2 (ref 0.7–1.7)
Alpha 1: 0.2 g/dL (ref 0.0–0.4)
Alpha2 Glob SerPl Elph-Mcnc: 1 g/dL (ref 0.4–1.0)
B-Globulin SerPl Elph-Mcnc: 0.9 g/dL (ref 0.7–1.3)
Gamma Glob SerPl Elph-Mcnc: 0.7 g/dL (ref 0.4–1.8)
Globulin, Total: 2.8 g/dL (ref 2.2–3.9)
IgA/Immunoglobulin A, Serum: 153 mg/dL (ref 61–437)
IgG (Immunoglobin G), Serum: 932 mg/dL (ref 603–1613)
IgM (Immunoglobulin M), Srm: 48 mg/dL (ref 15–143)
Total Protein: 5.9 g/dL — ABNORMAL LOW (ref 6.0–8.5)

## 2019-04-05 LAB — VITAMIN B12: Vitamin B-12: 401 pg/mL (ref 232–1245)

## 2019-04-05 LAB — ANA W/REFLEX: Anti Nuclear Antibody (ANA): POSITIVE — AB

## 2019-04-05 LAB — ANGIOTENSIN CONVERTING ENZYME: Angio Convert Enzyme: 45 U/L (ref 14–82)

## 2019-04-10 ENCOUNTER — Telehealth: Payer: Self-pay | Admitting: Neurology

## 2019-04-10 NOTE — Telephone Encounter (Signed)
I called the patient.  Blood work was unremarkable.  ANA was positive but the reflex panel of antibiotics was completely negative, likely this is of no clinical significance.  No source of a peripheral neuropathy was noted.

## 2019-05-15 ENCOUNTER — Ambulatory Visit: Payer: Medicare HMO | Admitting: Adult Health

## 2019-05-16 ENCOUNTER — Encounter: Payer: Self-pay | Admitting: Adult Health

## 2019-07-15 ENCOUNTER — Telehealth: Payer: Self-pay

## 2019-07-15 NOTE — Telephone Encounter (Signed)
Pt called requesting to set up an appt with Dr. Trula Slade before moving forward with scheduling his L BVT. He was offered an appt this week, and prefers to wait until 08/05/19.

## 2019-08-05 ENCOUNTER — Telehealth: Payer: Self-pay

## 2019-08-05 ENCOUNTER — Other Ambulatory Visit: Payer: Self-pay

## 2019-08-05 ENCOUNTER — Ambulatory Visit (INDEPENDENT_AMBULATORY_CARE_PROVIDER_SITE_OTHER): Payer: Medicare HMO | Admitting: Surgery

## 2019-08-05 ENCOUNTER — Encounter: Payer: Self-pay | Admitting: Surgery

## 2019-08-05 VITALS — BP 171/88 | HR 58 | Temp 97.4°F | Resp 18 | Ht 72.0 in | Wt 204.0 lb

## 2019-08-05 DIAGNOSIS — N185 Chronic kidney disease, stage 5: Secondary | ICD-10-CM | POA: Diagnosis not present

## 2019-08-05 NOTE — Telephone Encounter (Signed)
Call placed to inform pt to arrive for to admitting at [facility] on 08/15/19 for procedure. Pt verbalized understanding.

## 2019-08-05 NOTE — Progress Notes (Signed)
Vascular and Vein Specialist of [facility]  Patient name: Duane Ortiz MRN: [id number] DOB: [birth year] Sex: male   REASON FOR VISIT:    Follow up  [place] ILLNESS:    Tu Bayle is a 69 y.o. male who is right-handed in need of dialysis access.  He is medically managed for hypertension.  He has a history of pulmonary embolism and DVT.  He is on Xarelto.[name]  He is a diabetic as well as a former smoker.  I last saw him in March 2021.  I had recommended attempting a left basilic vein fistula.  I discussed that his veins are rather small and this may not be possible however I felt it was a reasonable attempt.  He is back today for further discussions.   PAST MEDICAL HISTORY:   Past Medical History:  Diagnosis Date  . Chronic kidney disease   . Chronic kidney disease   . Diabetes ([place]) 02/012017  . Fainting   . Gait abnormality 10/11/2016  . Gout   . Hypercholesteremia   . Hypercholesterolemia   . Hypertension   . Peripheral neuropathy 04/02/2019  . Pulmonary embolism ([place])   . Shortness of breath      FAMILY HISTORY:   Family History  Problem Relation Age of Onset  . Diabetes Mother   . Heart disease Mother   . Hypertension Mother   . Diabetes Sister   . Hypertension Sister   . Diabetes Brother   . Hypertension Brother   . Breast cancer Paternal Grandmother     SOCIAL HISTORY:   Social History   Tobacco Use  . Smoking status: Former Smoker    Packs/day: 0.25    Types: Cigarettes  . Smokeless tobacco: Never Used  . Tobacco comment: 1 packs per week  Substance Use Topics  . Alcohol use: Yes    Alcohol/week: 0.0 standard drinks     ALLERGIES:   Allergies  Allergen Reactions  . Sulfa Antibiotics Itching     CURRENT MEDICATIONS:   Current Outpatient Medications  Medication Sig Dispense Refill  . allopurinol (ZYLOPRIM) 100 MG tablet Take 100 mg by mouth daily.    [name] ALPRAZolam (XANAX) 0.5 MG tablet Take  2 tablets approximately 45 minutes prior to the MRI study, take a third tablet if needed. 3 tablet 0  . amLODipine (NORVASC) 10 MG tablet Take 10 mg by mouth daily.     [name] ammonium lactate (LAC-HYDRIN) 12 % lotion Apply to both legs twice daily.    . baclofen (LIORESAL) 10 MG tablet Take 10 mg by mouth 2 (two) times daily as needed for muscle spasms.     . calcitRIOL (ROCALTROL) 0.25 MCG capsule Take 0.25 mcg by mouth daily.    . cloNIDine (CATAPRES) 0.1 MG tablet Take 0.1 mg by mouth daily.     . furosemide (LASIX) 20 MG tablet Take 20 mg by mouth daily.     [name] gabapentin (NEURONTIN) 300 MG capsule Take 300 mg by mouth 2 (two) times daily.     [name] HYDROcodone-acetaminophen (NORCO) 7.5-325 MG tablet Take 1 tablet by mouth 3 (three) times daily as needed for pain.    [name] lidocaine (LIDODERM) 5 % Place 1 patch onto the skin daily. Remove & Discard patch within 12 hours or as directed by MD 30 patch 0  . LOKELMA 10 g PACK packet DIS THE CNTS IN WATER AND DRK PO TWICE A WEEK ON MON AND FRI    . nebivolol (BYSTOLIC) 10 MG  tablet Take 10 mg by mouth daily.    [name] omeprazole (PRILOSEC) 20 MG capsule     . pravastatin (PRAVACHOL) 40 MG tablet Take 40 mg by mouth daily.    . rivaroxaban (XARELTO) 20 MG TABS tablet Take 20 mg by mouth every morning.     . sodium bicarbonate 650 MG tablet Take 1,300 mg by mouth daily.     [name] spironolactone (ALDACTONE) 50 MG tablet     . tamsulosin (FLOMAX) 0.4 MG CAPS capsule Take 0.4 mg by mouth daily.    . Vitamin D, Ergocalciferol, (DRISDOL) 1.25 MG (50000 UT) CAPS capsule TK ONE C PO ONCE A WEEK     No current facility-administered medications for this visit.    REVIEW OF SYSTEMS:   [X]  denotes positive finding, [ ]  denotes negative finding Cardiac  Comments:  Chest pain or chest pressure:    Shortness of breath upon exertion:    Short of breath when lying flat:    Irregular heart rhythm:        Vascular    Pain in calf, thigh, or hip brought on by ambulation:     Pain in feet at night that wakes you up from your sleep:     Blood clot in your veins:    Leg swelling:         Pulmonary    Oxygen at home:    Productive cough:     Wheezing:         Neurologic    Sudden weakness in arms or legs:     Sudden numbness in arms or legs:     Sudden onset of difficulty speaking or slurred speech:    Temporary loss of vision in one eye:     Problems with dizziness:         Gastrointestinal    Blood in stool:     Vomited blood:         Genitourinary    Burning when urinating:     Blood in urine:        Psychiatric    Major depression:         Hematologic    Bleeding problems:    Problems with blood clotting too easily:        Skin    Rashes or ulcers:        Constitutional    Fever or chills:      PHYSICAL EXAM:   Vitals:   08/05/19 1219  BP: (!) 171/88  Pulse: (!) 58  Resp: 18  Temp: (!) 97.4 F (36.3 C)  SpO2: 97%  Weight: 204 lb (92.5 kg)  Height: 6' (1.829 m)    GENERAL: The patient is a well-nourished male, in no acute distress. The vital signs are documented above. CARDIAC: There is a regular rate and rhythm.  VASCULAR: palpable radial pulse PULMONARY: Non-labored respirations ABDOMEN: Soft and non-tender with normal pitched bowel sounds.  MUSCULOSKELETAL: There are no major deformities or cyanosis. NEUROLOGIC: No focal weakness or paresthesias are detected. SKIN: There are no ulcers or rashes noted. PSYCHIATRIC: The patient has a normal affect.  STUDIES:   I have reviewed his vein mapping again with the following findings: +-----------------+-------------+----------+---------+  Left Cephalic  Diameter (cm)Depth (cm)Findings   +-----------------+-------------+----------+---------+  Shoulder       0.21               +-----------------+-------------+----------+---------+  Prox upper arm    0.15               +-----------------+-------------+----------+---------+  Mid upper arm    0.20               +-----------------+-------------+----------+---------+  Dist upper arm    0.18               +-----------------+-------------+----------+---------+  Antecubital fossa  0.30        branching  +-----------------+-------------+----------+---------+  Prox forearm     0.14               +-----------------+-------------+----------+---------+  Mid forearm     0.13               +-----------------+-------------+----------+---------+  Dist forearm     0.17               +-----------------+-------------+----------+---------+   +-----------------+-------------+----------+---------+  Left Basilic   Diameter (cm)Depth (cm)Findings   +-----------------+-------------+----------+---------+  Prox upper arm    0.25               +-----------------+-------------+----------+---------+  Mid upper arm    0.22               +-----------------+-------------+----------+---------+  Dist upper arm    0.26        branching  +-----------------+-------------+----------+---------+  Antecubital fossa  0.24               +-----------------+-------------+----------+---------+   MEDICAL ISSUES:   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  We discussed proceeding with fistula versus graft.  Based on ultrasound findings, his basilic vein would be his best surface vein however it is on the small side.  I told her I would evaluate this and consider a for staged basilic vein fistula.  If I do this, he understands that he would need a second procedure.  If I get in and feel that none of his veins are adequate, I would place a graft.  I discussed this with the patient and his son.  All other questions were answered.  His procedure been scheduled for Thursday, August 12.  He will be off of his Xarelto  prior to his procedure.    Leia Alf, MD, FACS [facility] and Vein Specialists of [facility] [phone] Pager [phone]

## 2019-08-05 NOTE — H&P (View-Only) (Signed)
[facility]  Patient name: Duane Ortiz MRN: [id number] DOB: 1950/01/10 Sex: male   REASON FOR VISIT:    Follow up  [place] ILLNESS:    Duane Ortiz is a 69 y.o. male who is right-handed in need of dialysis access.  He is medically managed for hypertension.  He has a history of pulmonary embolism and DVT.  He is on Xarelto.[name]  He is a diabetic as well as a former smoker.  I last saw him in March 2021.  I had recommended attempting a left basilic vein fistula.  I discussed that his veins are rather small and this may not be possible however I felt it was a reasonable attempt.  He is back today for further discussions.   PAST MEDICAL HISTORY:   Past Medical History:  Diagnosis Date  . Chronic kidney disease   . Chronic kidney disease   . Diabetes ([place]) 02/012017  . Fainting   . Gait abnormality 10/11/2016  . Gout   . Hypercholesteremia   . Hypercholesterolemia   . Hypertension   . Peripheral neuropathy 04/02/2019  . Pulmonary embolism ([place])   . Shortness of breath      FAMILY HISTORY:   Family History  Problem Relation Age of Onset  . Diabetes Mother   . Heart disease Mother   . Hypertension Mother   . Diabetes Sister   . Hypertension Sister   . Diabetes Brother   . Hypertension Brother   . Breast cancer Paternal Grandmother     SOCIAL HISTORY:   Social History   Tobacco Use  . Smoking status: Former Smoker    Packs/day: 0.25    Types: Cigarettes  . Smokeless tobacco: Never Used  . Tobacco comment: 1 packs per week  Substance Use Topics  . Alcohol use: Yes    Alcohol/week: 0.0 standard drinks     ALLERGIES:   Allergies  Allergen Reactions  . Sulfa Antibiotics Itching     CURRENT MEDICATIONS:   Current Outpatient Medications  Medication Sig Dispense Refill  . allopurinol (ZYLOPRIM) 100 MG tablet Take 100 mg by mouth daily.    [name] ALPRAZolam (XANAX) 0.5 MG tablet Take  2 tablets approximately 45 minutes prior to the MRI study, take a third tablet if needed. 3 tablet 0  . amLODipine (NORVASC) 10 MG tablet Take 10 mg by mouth daily.     [name] ammonium lactate (LAC-HYDRIN) 12 % lotion Apply to both legs twice daily.    . baclofen (LIORESAL) 10 MG tablet Take 10 mg by mouth 2 (two) times daily as needed for muscle spasms.     . calcitRIOL (ROCALTROL) 0.25 MCG capsule Take 0.25 mcg by mouth daily.    . cloNIDine (CATAPRES) 0.1 MG tablet Take 0.1 mg by mouth daily.     . furosemide (LASIX) 20 MG tablet Take 20 mg by mouth daily.     [name] gabapentin (NEURONTIN) 300 MG capsule Take 300 mg by mouth 2 (two) times daily.     [name] HYDROcodone-acetaminophen (NORCO) 7.5-325 MG tablet Take 1 tablet by mouth 3 (three) times daily as needed for pain.    [name] lidocaine (LIDODERM) 5 % Place 1 patch onto the skin daily. Remove & Discard patch within 12 hours or as directed by MD 30 patch 0  . LOKELMA 10 g PACK packet DIS THE CNTS IN WATER AND DRK PO TWICE A WEEK ON MON AND FRI    . nebivolol (BYSTOLIC) 10 MG  tablet Take 10 mg by mouth daily.    [name] omeprazole (PRILOSEC) 20 MG capsule     . pravastatin (PRAVACHOL) 40 MG tablet Take 40 mg by mouth daily.    . rivaroxaban (XARELTO) 20 MG TABS tablet Take 20 mg by mouth every morning.     . sodium bicarbonate 650 MG tablet Take 1,300 mg by mouth daily.     [name] spironolactone (ALDACTONE) 50 MG tablet     . tamsulosin (FLOMAX) 0.4 MG CAPS capsule Take 0.4 mg by mouth daily.    . Vitamin D, Ergocalciferol, (DRISDOL) 1.25 MG (50000 UT) CAPS capsule TK ONE C PO ONCE A WEEK     No current facility-administered medications for this visit.    REVIEW OF SYSTEMS:   [X]  denotes positive finding, [ ]  denotes negative finding Cardiac  Comments:  Chest pain or chest pressure:    Shortness of breath upon exertion:    Short of breath when lying flat:    Irregular heart rhythm:        Vascular    Pain in calf, thigh, or hip brought on by ambulation:      Pain in feet at night that wakes you up from your sleep:     Blood clot in your veins:    Leg swelling:         Pulmonary    Oxygen at home:    Productive cough:     Wheezing:         Neurologic    Sudden weakness in arms or legs:     Sudden numbness in arms or legs:     Sudden onset of difficulty speaking or slurred speech:    Temporary loss of vision in one eye:     Problems with dizziness:         Gastrointestinal    Blood in stool:     Vomited blood:         Genitourinary    Burning when urinating:     Blood in urine:        Psychiatric    Major depression:         Hematologic    Bleeding problems:    Problems with blood clotting too easily:        Skin    Rashes or ulcers:        Constitutional    Fever or chills:      PHYSICAL EXAM:   Vitals:   08/05/19 1219  BP: (!) 171/88  Pulse: (!) 58  Resp: 18  Temp: (!) 97.4 F (36.3 C)  SpO2: 97%  Weight: 204 lb (92.5 kg)  Height: 6' (1.829 m)    GENERAL: The patient is a well-nourished male, in no acute distress. The vital signs are documented above. CARDIAC: There is a regular rate and rhythm.  VASCULAR: palpable radial pulse PULMONARY: Non-labored respirations ABDOMEN: Soft and non-tender with normal pitched bowel sounds.  MUSCULOSKELETAL: There are no major deformities or cyanosis. NEUROLOGIC: No focal weakness or paresthesias are detected. SKIN: There are no ulcers or rashes noted. PSYCHIATRIC: The patient has a normal affect.  STUDIES:   I have reviewed his vein mapping again with the following findings: +-----------------+-------------+----------+---------+  Left Cephalic  Diameter (cm)Depth (cm)Findings   +-----------------+-------------+----------+---------+  Shoulder       0.21               +-----------------+-------------+----------+---------+  Prox upper arm    0.15               +-----------------+-------------+----------+---------+  Mid upper arm    0.20               +-----------------+-------------+----------+---------+  Dist upper arm    0.18               +-----------------+-------------+----------+---------+  Antecubital fossa  0.30        branching  +-----------------+-------------+----------+---------+  Prox forearm     0.14               +-----------------+-------------+----------+---------+  Mid forearm     0.13               +-----------------+-------------+----------+---------+  Dist forearm     0.17               +-----------------+-------------+----------+---------+   +-----------------+-------------+----------+---------+  Left Basilic   Diameter (cm)Depth (cm)Findings   +-----------------+-------------+----------+---------+  Prox upper arm    0.25               +-----------------+-------------+----------+---------+  Mid upper arm    0.22               +-----------------+-------------+----------+---------+  Dist upper arm    0.26        branching  +-----------------+-------------+----------+---------+  Antecubital fossa  0.24               +-----------------+-------------+----------+---------+   MEDICAL ISSUES:   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  We discussed proceeding with fistula versus graft.  Based on ultrasound findings, his basilic vein would be his best surface vein however it is on the small side.  I told her I would evaluate this and consider a for staged basilic vein fistula.  If I do this, he understands that he would need a second procedure.  If I get in and feel that none of his veins are adequate, I would place a graft.  I discussed this with the patient and his son.  All other questions were answered.  His procedure been scheduled for Thursday, August 12.  He will be off of his Xarelto  prior to his procedure.    Leia Alf, MD, FACS [facility] of [facility] [phone] Pager [phone]

## 2019-08-14 ENCOUNTER — Other Ambulatory Visit (HOSPITAL_COMMUNITY)
Admission: RE | Admit: 2019-08-14 | Discharge: 2019-08-14 | Disposition: A | Discharge status: Home / Self Care | Payer: Medicare HMO | Source: Ambulatory Visit | Attending: Surgery | Admitting: Surgery

## 2019-08-14 ENCOUNTER — Encounter (HOSPITAL_COMMUNITY): Payer: Self-pay | Admitting: Surgery

## 2019-08-14 ENCOUNTER — Other Ambulatory Visit: Payer: Self-pay

## 2019-08-14 DIAGNOSIS — Z20822 Contact with and (suspected) exposure to covid-19: Secondary | ICD-10-CM | POA: Diagnosis not present

## 2019-08-14 DIAGNOSIS — Z01812 Encounter for preprocedural laboratory examination: Secondary | ICD-10-CM | POA: Insufficient documentation

## 2019-08-14 LAB — SARS CORONAVIRUS 2 (TAT 6-24 HRS): SARS Coronavirus 2: NEGATIVE

## 2019-08-14 NOTE — Progress Notes (Signed)
Pt denies SOB, chest pain and being under the care of a cardiologist.  Pt stated that Dr. Nancy Fetter is his PCP.  Pt stated that he had a stress test 2-3 years ago but cannot recall the name of the physician and practice. Pt denies having an echo and cardiac cath. Pt denies having an EKG and chest x ray, Pt made aware to stop taking  vitamins, fish oil and herbal medications. Do not take any NSAIDs ie: Ibuprofen, Advil, Naproxen (Aleve), Motrin, BC and Goody Powder. Pt stated that his last dose of Xarelto was " Saturday ." Pt reminded to quarantine. Pt verbalized understanding of all pre-op instructions.

## 2019-08-15 ENCOUNTER — Ambulatory Visit (HOSPITAL_COMMUNITY): Payer: Medicare HMO | Admitting: Anesthesiology

## 2019-08-15 ENCOUNTER — Other Ambulatory Visit: Payer: Self-pay

## 2019-08-15 ENCOUNTER — Ambulatory Visit (HOSPITAL_COMMUNITY)
Admission: RE | Admit: 2019-08-15 | Discharge: 2019-08-15 | Disposition: A | Discharge status: Home / Self Care | Payer: Medicare HMO | Attending: Surgery | Admitting: Surgery

## 2019-08-15 ENCOUNTER — Encounter (HOSPITAL_COMMUNITY)
Admission: RE | Disposition: A | Discharge status: Home / Self Care | Payer: Self-pay | Source: Home / Self Care | Attending: Surgery

## 2019-08-15 ENCOUNTER — Encounter (HOSPITAL_COMMUNITY): Payer: Self-pay | Admitting: Surgery

## 2019-08-15 DIAGNOSIS — N185 Chronic kidney disease, stage 5: Secondary | ICD-10-CM | POA: Diagnosis not present

## 2019-08-15 DIAGNOSIS — M109 Gout, unspecified: Secondary | ICD-10-CM | POA: Insufficient documentation

## 2019-08-15 DIAGNOSIS — Z86711 Personal history of pulmonary embolism: Secondary | ICD-10-CM | POA: Diagnosis not present

## 2019-08-15 DIAGNOSIS — R269 Unspecified abnormalities of gait and mobility: Secondary | ICD-10-CM | POA: Insufficient documentation

## 2019-08-15 DIAGNOSIS — Z87891 Personal history of nicotine dependence: Secondary | ICD-10-CM | POA: Insufficient documentation

## 2019-08-15 DIAGNOSIS — Z79899 Other long term (current) drug therapy: Secondary | ICD-10-CM | POA: Diagnosis not present

## 2019-08-15 DIAGNOSIS — E1122 Type 2 diabetes mellitus with diabetic chronic kidney disease: Secondary | ICD-10-CM | POA: Insufficient documentation

## 2019-08-15 DIAGNOSIS — Z7901 Long term (current) use of anticoagulants: Secondary | ICD-10-CM | POA: Diagnosis not present

## 2019-08-15 DIAGNOSIS — E1140 Type 2 diabetes mellitus with diabetic neuropathy, unspecified: Secondary | ICD-10-CM | POA: Insufficient documentation

## 2019-08-15 DIAGNOSIS — I120 Hypertensive chronic kidney disease with stage 5 chronic kidney disease or end stage renal disease: Secondary | ICD-10-CM | POA: Insufficient documentation

## 2019-08-15 DIAGNOSIS — E7800 Pure hypercholesterolemia, unspecified: Secondary | ICD-10-CM | POA: Diagnosis not present

## 2019-08-15 HISTORY — DX: Presence of dental prosthetic device (complete) (partial): Z97.2

## 2019-08-15 HISTORY — PX: AV FISTULA PLACEMENT: SHX1204

## 2019-08-15 HISTORY — DX: Unspecified osteoarthritis, unspecified site: M19.90

## 2019-08-15 HISTORY — DX: Sleep apnea, unspecified: G47.30

## 2019-08-15 HISTORY — DX: Gastro-esophageal reflux disease without esophagitis: K21.9

## 2019-08-15 HISTORY — DX: Headache, unspecified: R51.9

## 2019-08-15 LAB — POCT I-STAT, CHEM 8
BUN: 107 mg/dL — ABNORMAL HIGH (ref 8–23)
Calcium, Ion: 1.01 mmol/L — ABNORMAL LOW (ref 1.15–1.40)
Chloride: 116 mmol/L — ABNORMAL HIGH (ref 98–111)
Creatinine, Ser: 8.9 mg/dL — ABNORMAL HIGH (ref 0.61–1.24)
Glucose, Bld: 86 mg/dL (ref 70–99)
HCT: 26 % — ABNORMAL LOW (ref 39.0–52.0)
Hemoglobin: 8.8 g/dL — ABNORMAL LOW (ref 13.0–17.0)
Potassium: 5 mmol/L (ref 3.5–5.1)
Sodium: 143 mmol/L (ref 135–145)
TCO2: 14 mmol/L — ABNORMAL LOW (ref 22–32)

## 2019-08-15 LAB — GLUCOSE, CAPILLARY
Glucose-Capillary: 83 mg/dL (ref 70–99)
Glucose-Capillary: 74 mg/dL (ref 70–99)
Glucose-Capillary: 89 mg/dL (ref 70–99)

## 2019-08-15 SURGERY — ARTERIOVENOUS (AV) FISTULA CREATION
Anesthesia: Monitor Anesthesia Care | Laterality: Left

## 2019-08-15 MED ORDER — CHLORHEXIDINE GLUCONATE 4 % EX LIQD: 60.0000 mL | Freq: Once | CUTANEOUS | Status: DC

## 2019-08-15 MED ORDER — FENTANYL CITRATE (PF) 250 MCG/5ML IJ SOLN
INTRAMUSCULAR | Status: DC | PRN
  Administered 2019-08-15 (×2): 50 ug via INTRAVENOUS

## 2019-08-15 MED ORDER — LIDOCAINE-EPINEPHRINE (PF) 1 %-1:200000 IJ SOLN
INTRAMUSCULAR | Status: AC
  Filled 2019-08-15: qty 30

## 2019-08-15 MED ORDER — LIDOCAINE 2% (20 MG/ML) 5 ML SYRINGE
INTRAMUSCULAR | Status: AC
  Filled 2019-08-15: qty 5

## 2019-08-15 MED ORDER — HYDRALAZINE HCL 20 MG/ML IJ SOLN
5.0000 mg | INTRAMUSCULAR | Status: AC | PRN
  Administered 2019-08-15: 5 mg via INTRAVENOUS

## 2019-08-15 MED ORDER — FENTANYL CITRATE (PF) 250 MCG/5ML IJ SOLN
INTRAMUSCULAR | Status: AC
  Filled 2019-08-15: qty 5

## 2019-08-15 MED ORDER — PROPOFOL 500 MG/50ML IV EMUL
INTRAVENOUS | Status: DC | PRN
  Administered 2019-08-15: 100 ug/kg/min via INTRAVENOUS

## 2019-08-15 MED ORDER — FENTANYL CITRATE (PF) 100 MCG/2ML IJ SOLN: 25.0000 ug | INTRAMUSCULAR | Status: DC | PRN

## 2019-08-15 MED ORDER — PROTAMINE SULFATE 10 MG/ML IV SOLN
INTRAVENOUS | Status: DC | PRN
Start: 2019-08-15 — End: 2019-08-15
  Administered 2019-08-15: 25 mg via INTRAVENOUS

## 2019-08-15 MED ORDER — CEFAZOLIN SODIUM-DEXTROSE 2-4 GM/100ML-% IV SOLN
2.0000 g | INTRAVENOUS | Status: AC
  Administered 2019-08-15: 2 g via INTRAVENOUS
  Filled 2019-08-15: qty 100

## 2019-08-15 MED ORDER — CHLORHEXIDINE GLUCONATE 0.12 % MT SOLN
OROMUCOSAL | Status: AC
  Administered 2019-08-15: 15 mL via OROMUCOSAL
  Filled 2019-08-15: qty 15

## 2019-08-15 MED ORDER — SODIUM CHLORIDE 0.9 % IV SOLN: INTRAVENOUS | Status: DC

## 2019-08-15 MED ORDER — ONDANSETRON HCL 4 MG/2ML IJ SOLN: 4.0000 mg | Freq: Once | INTRAMUSCULAR | Status: DC | PRN

## 2019-08-15 MED ORDER — 0.9 % SODIUM CHLORIDE (POUR BTL) OPTIME
TOPICAL | Status: DC | PRN
  Administered 2019-08-15: 1000 mL

## 2019-08-15 MED ORDER — SODIUM CHLORIDE 0.9 % IV SOLN
INTRAVENOUS | Status: AC
  Filled 2019-08-15: qty 1.2

## 2019-08-15 MED ORDER — OXYCODONE-ACETAMINOPHEN 5-325 MG PO TABS: 1.0000 | ORAL_TABLET | ORAL | 0 refills | Status: DC | PRN

## 2019-08-15 MED ORDER — ORAL CARE MOUTH RINSE: 15.0000 mL | Freq: Once | OROMUCOSAL | Status: AC

## 2019-08-15 MED ORDER — MEPERIDINE HCL 25 MG/ML IJ SOLN: 6.2500 mg | INTRAMUSCULAR | Status: DC | PRN

## 2019-08-15 MED ORDER — CHLORHEXIDINE GLUCONATE 0.12 % MT SOLN: 15.0000 mL | Freq: Once | OROMUCOSAL | Status: AC

## 2019-08-15 MED ORDER — LIDOCAINE-EPINEPHRINE (PF) 1 %-1:200000 IJ SOLN
INTRAMUSCULAR | Status: DC | PRN
  Administered 2019-08-15: 30 mL

## 2019-08-15 MED ORDER — HYDRALAZINE HCL 20 MG/ML IJ SOLN
INTRAMUSCULAR | Status: AC
  Administered 2019-08-15: 5 mg via INTRAVENOUS
  Filled 2019-08-15: qty 1

## 2019-08-15 MED ORDER — HEPARIN SODIUM (PORCINE) 1000 UNIT/ML IJ SOLN
INTRAMUSCULAR | Status: AC
  Filled 2019-08-15: qty 1

## 2019-08-15 MED ORDER — HEMOSTATIC AGENTS (NO CHARGE) OPTIME
TOPICAL | Status: DC | PRN
  Administered 2019-08-15: 1 via TOPICAL

## 2019-08-15 MED ORDER — ONDANSETRON HCL 4 MG/2ML IJ SOLN
INTRAMUSCULAR | Status: AC
  Filled 2019-08-15: qty 2

## 2019-08-15 MED ORDER — ONDANSETRON HCL 4 MG/2ML IJ SOLN
INTRAMUSCULAR | Status: DC | PRN
  Administered 2019-08-15: 4 mg via INTRAVENOUS

## 2019-08-15 MED ORDER — HEPARIN SODIUM (PORCINE) 1000 UNIT/ML IJ SOLN
INTRAMUSCULAR | Status: DC | PRN
  Administered 2019-08-15: 3000 [IU] via INTRAVENOUS

## 2019-08-15 MED ORDER — SODIUM CHLORIDE 0.9 % IV SOLN
INTRAVENOUS | Status: DC | PRN
  Administered 2019-08-15: 500 mL

## 2019-08-15 SURGICAL SUPPLY — 35 items
ARMBAND PINK RESTRICT EXTREMIT (MISCELLANEOUS) ×4 IMPLANT
CANISTER SUCT 3000ML PPV (MISCELLANEOUS) ×2 IMPLANT
CLIP VESOCCLUDE MED 6/CT (CLIP) ×2 IMPLANT
CLIP VESOCCLUDE SM WIDE 6/CT (CLIP) ×2 IMPLANT
COVER PROBE W GEL 5X96 (DRAPES) ×2 IMPLANT
COVER WAND RF STERILE (DRAPES) ×2 IMPLANT
DERMABOND ADHESIVE PROPEN (GAUZE/BANDAGES/DRESSINGS) ×1
DERMABOND ADVANCED (GAUZE/BANDAGES/DRESSINGS) ×1
DERMABOND ADVANCED .7 DNX12 (GAUZE/BANDAGES/DRESSINGS) ×1 IMPLANT
DERMABOND ADVANCED .7 DNX6 (GAUZE/BANDAGES/DRESSINGS) ×1 IMPLANT
ELECT REM PT RETURN 9FT ADLT (ELECTROSURGICAL) ×2
ELECTRODE REM PT RTRN 9FT ADLT (ELECTROSURGICAL) ×1 IMPLANT
GLOVE BIOGEL PI IND STRL 7.5 (GLOVE) ×1 IMPLANT
GLOVE BIOGEL PI INDICATOR 7.5 (GLOVE) ×1
GLOVE SURG SS PI 7.5 STRL IVOR (GLOVE) ×2 IMPLANT
GOWN STRL REUS W/ TWL LRG LVL3 (GOWN DISPOSABLE) ×2 IMPLANT
GOWN STRL REUS W/ TWL XL LVL3 (GOWN DISPOSABLE) ×1 IMPLANT
GOWN STRL REUS W/TWL LRG LVL3 (GOWN DISPOSABLE) ×2
GOWN STRL REUS W/TWL XL LVL3 (GOWN DISPOSABLE) ×1
GRAFT GORETEX STRT 4-7X45 (Vascular Products) ×2 IMPLANT
HEMOSTAT SNOW SURGICEL 2X4 (HEMOSTASIS) ×2 IMPLANT
KIT BASIN OR (CUSTOM PROCEDURE TRAY) ×2 IMPLANT
KIT TURNOVER KIT B (KITS) ×2 IMPLANT
LOOP VESSEL MINI RED (MISCELLANEOUS) ×2 IMPLANT
NS IRRIG 1000ML POUR BTL (IV SOLUTION) ×2 IMPLANT
PACK CV ACCESS (CUSTOM PROCEDURE TRAY) ×2 IMPLANT
PAD ARMBOARD 7.5X6 YLW CONV (MISCELLANEOUS) ×4 IMPLANT
SUT PROLENE 6 0 CC (SUTURE) ×6 IMPLANT
SUT VIC AB 3-0 SH 27 (SUTURE) ×2
SUT VIC AB 3-0 SH 27X BRD (SUTURE) ×2 IMPLANT
SUT VICRYL 4-0 PS2 18IN ABS (SUTURE) IMPLANT
SYR TOOMEY 50ML (SYRINGE) ×2 IMPLANT
TOWEL GREEN STERILE (TOWEL DISPOSABLE) ×2 IMPLANT
UNDERPAD 30X36 HEAVY ABSORB (UNDERPADS AND DIAPERS) ×2 IMPLANT
WATER STERILE IRR 1000ML POUR (IV SOLUTION) ×2 IMPLANT

## 2019-08-15 NOTE — Anesthesia Postprocedure Evaluation (Signed)
Anesthesia Post Note  Patient: Duane Ortiz  Procedure(s) Performed: LEFT ARM ARTERIOVENOUS GORE-TEX GRAFT (Left )     Patient location during evaluation: PACU Anesthesia Type: MAC Level of consciousness: awake and alert Pain management: pain level controlled Vital Signs Assessment: post-procedure vital signs reviewed and stable Respiratory status: spontaneous breathing Cardiovascular status: stable Anesthetic complications: no   No complications documented.  Last Vitals:  Vitals:   08/15/19 1145 08/15/19 1200  BP: 129/67 133/77  Pulse: 67 (!) 58  Resp: 16 13  Temp:    SpO2: 99% 100%    Last Pain:  Vitals:   08/15/19 1200  TempSrc:   PainSc: 0-No pain                 Nolon Nations

## 2019-08-15 NOTE — Progress Notes (Signed)
BP rechecked, Dr. Lissa Hoard aware, no orders received.

## 2019-08-15 NOTE — Op Note (Signed)
° ° °  Patient name: Duane Ortiz MRN: [id number] DOB: 04/04/1950 Sex: male  08/15/2019 Pre-operative Diagnosis: CKD 5 Post-operative diagnosis:  Same Surgeon:  Annamarie Major Assistants: Gaye Alken, RNFA Procedure:   Left upper arm dialysis graft Anesthesia: MAC Blood Loss: Minimal Specimens: None  Findings: I explored the basilic vein which was too small for fistula creation and so a 4 x 7 graft was placed.  The venous anastomosis is end to end  Indications: The patient is nearing the need for dialysis.  He has marginal surface veins.  He comes in today for fistula versus graft.  Procedure:  The patient was identified in the holding area and taken to [place] [facility]  The patient was then placed supine on the table. MAC anesthesia was administered.  The patient was prepped and draped in the usual sterile fashion.  A time out was called and antibiotics were administered.  1% lidocaine was used for local anesthesia.  I used ultrasound to evaluate the cephalic and basilic vein.  The cephalic vein was too small for fistula creation.  The basilic vein was marginal.  I made an oblique incision just proximal to the antecubital crease.  I first dissected out the brachial artery which was a 4 mm disease-free artery.  It was encircled with Vesseloops.  I then explored the basilic vein.  This was a small vein measuring 2.5 mm.  It was fully mobilized throughout the width of the incision.  Side branches were ligated between silk ties.  The vein was then ligated distally.  I was unable to get this to distend with heparin saline.  I did not think it was a usable vein and so it was ligated with a metal clip.  I then made an incision up near the axilla.  Cautery was used to divide the subcutaneous tissue.  I dissected out the proximal brachial vein.  This was a 6 mm vein.  It was fully mobilized.  I then created a tunnel between the 2 incisions using a curved Gore tunneler.  The patient was given 3000 units of  heparin.  A 4 x 7 graft was brought through the tunnel.  The brachial artery was then occluded with vascular clamps and a #11 blade was used to make an arteriotomy.  This was extended longitudinally with Potts scissors.  The graft was beveled to fit the size of the arteriotomy and a running anastomosis was created with 6-0 Prolene.  Clamps were released.  There was excellent thrill within the graft which was flushed with heparin saline and reoccluded.  Attention was then turned towards the axilla.  A baby Belenda Cruise clamp was placed proximally.  I then ligated the brachial vein with a 2-0 silk tie and transected it.  The graft was then cut to the appropriate length and a end to end anastomosis was performed with 6-0 Prolene.  Prior to completion the appropriate flushing maneuvers were performed and the anastomosis was completed.  Patient had excellent thrill within the graft.  He had a brisk radial and ulnar artery Doppler signal.  50 mg of protamine was then administered.  Once hemostasis was satisfactory, both incisions were closed with 2 layers of 3-0 Vicryl followed by Dermabond.  There were no immediate complications.   Disposition: To PACU stable.   Theotis Burrow, M.D., [facility] [place] Office: [phone] Pager:  [phone]

## 2019-08-15 NOTE — Transfer of Care (Signed)
Immediate Anesthesia Transfer of Care Note  Patient: Duane Ortiz  Procedure(s) Performed: LEFT ARM ARTERIOVENOUS GORE-TEX GRAFT (Left )  Patient Location: PACU  Anesthesia Type:MAC  Level of Consciousness: awake, alert , oriented and patient cooperative  Airway & Oxygen Therapy: Patient Spontanous Breathing and Patient connected to face mask oxygen  Post-op Assessment: Report given to RN, Post -op Vital signs reviewed and stable and Patient moving all extremities  Post vital signs: Reviewed and stable  Last Vitals:  Vitals Value Taken Time  BP 113/61 08/15/19 1130  Temp    Pulse 64 08/15/19 1130  Resp 13 08/15/19 1130  SpO2 98 % 08/15/19 1130  Vitals shown include unvalidated device data.  Last Pain:  Vitals:   08/15/19 0744  TempSrc:   PainSc: 0-No pain         Complications: No complications documented.

## 2019-08-15 NOTE — Interval H&P Note (Signed)
History and Physical Interval Note:  08/15/2019 9:04 AM  [name]  has presented today for surgery, with the diagnosis of CHRONIC RENAL INSUFFICIENCY, STAGE V.  The various methods of treatment have been discussed with the patient and family. After consideration of risks, benefits and other options for treatment, the patient has consented to  Procedure(s): LEFT ARM ARTERIOVENOUS (AV) FISTULA VERSUS ARTERIOVENOUS GORE-TEX GRAFT (Left) as a surgical intervention.  The patient's history has been reviewed, patient examined, no change in status, stable for surgery.  I have reviewed the patient's chart and labs.  Questions were answered to the patient's satisfaction.     Duane Ortiz

## 2019-08-15 NOTE — Progress Notes (Signed)
Elevated BP, Dr. Lissa Hoard in room, verbal orders received. Patient has no c/o headache, blurry vision, or dizziness.

## 2019-08-15 NOTE — Anesthesia Preprocedure Evaluation (Addendum)
Anesthesia Evaluation  Patient identified by MRN, date of birth, ID band Patient awake    Reviewed: Allergy & Precautions, NPO status , Patient's Chart, lab work & pertinent test results  Airway Mallampati: III  TM Distance: >3 FB Neck ROM: Full    Dental  (+) Edentulous Upper, Edentulous Lower, Dental Advisory Given   Pulmonary shortness of breath, sleep apnea , Current Smoker and Patient abstained from smoking.,    Pulmonary exam normal breath sounds clear to auscultation       Cardiovascular hypertension, Pt. on medications and Pt. on home beta blockers Normal cardiovascular exam Rhythm:Regular Rate:Normal     Neuro/Psych  Headaches,  Neuromuscular disease    GI/Hepatic Neg liver ROS, GERD  ,  Endo/Other  diabetes  Renal/GU Renal disease     Musculoskeletal  (+) Arthritis ,   Abdominal   Peds  Hematology negative hematology ROS (+)   Anesthesia Other Findings   Reproductive/Obstetrics                            Anesthesia Physical Anesthesia Plan  ASA: IV  Anesthesia Plan: MAC   Post-op Pain Management:    Induction: Intravenous  PONV Risk Score and Plan: 0 and Propofol infusion, TIVA and Treatment may vary due to age or medical condition  Airway Management Planned:   Additional Equipment: None  Intra-op Plan:   Post-operative Plan:   Informed Consent: I have reviewed the patients History and Physical, chart, labs and discussed the procedure including the risks, benefits and alternatives for the proposed anesthesia with the patient or authorized representative who has indicated his/her understanding and acceptance.     Dental advisory given  Plan Discussed with: CRNA  Anesthesia Plan Comments: (Hypertensive in preop. Per recent notes/visits, SBP 170-180s. Discussed risk of stroke/MI with pt and brabham. Plan to give hydralazine in preop and attempt to get SBP <190.)        Anesthesia Quick Evaluation

## 2019-08-16 ENCOUNTER — Encounter (HOSPITAL_COMMUNITY): Payer: Self-pay | Admitting: Surgery

## 2019-08-28 DIAGNOSIS — E875 Hyperkalemia: Secondary | ICD-10-CM | POA: Insufficient documentation

## 2019-08-28 DIAGNOSIS — E872 Acidosis, unspecified: Secondary | ICD-10-CM | POA: Insufficient documentation

## 2019-08-28 DIAGNOSIS — D649 Anemia, unspecified: Secondary | ICD-10-CM | POA: Insufficient documentation

## 2019-08-28 DIAGNOSIS — B353 Tinea pedis: Secondary | ICD-10-CM | POA: Insufficient documentation

## 2019-08-28 DIAGNOSIS — B49 Unspecified mycosis: Secondary | ICD-10-CM | POA: Insufficient documentation

## 2019-09-10 ENCOUNTER — Ambulatory Visit: Payer: Self-pay | Admitting: Physician Assistant

## 2019-09-10 ENCOUNTER — Other Ambulatory Visit: Payer: Self-pay | Admitting: *Deleted

## 2019-09-10 ENCOUNTER — Other Ambulatory Visit: Payer: Self-pay

## 2019-09-10 ENCOUNTER — Encounter: Payer: Self-pay | Admitting: *Deleted

## 2019-09-10 VITALS — BP 185/76 | HR 58 | Temp 98.0°F | Resp 20 | Ht 71.0 in | Wt 198.0 lb

## 2019-09-10 DIAGNOSIS — N185 Chronic kidney disease, stage 5: Secondary | ICD-10-CM

## 2019-09-10 DIAGNOSIS — R600 Localized edema: Secondary | ICD-10-CM

## 2019-09-10 NOTE — Progress Notes (Signed)
POST OPERATIVE DIALYSIS ACCESS OFFICE NOTE    CC:  F/u for dialysis access surgery  HPI:  This is a 69 y.o. male who is s/p left upper arm dialysis graft on 08/15/2019 by Dr. Trula Slade.  The patient came to the office today complaining of left upper extremity swelling.  He is accompanied by his son.   He was not requiring dialysis pre-operatively, but he states he has been told he must now start dialysis.  He is concerned regarding access at this time of his graft due to the swelling.  He denies hand pain, fever or chills.  Maintained on Xarelto for history of DVT. Allergies  Allergen Reactions   Gabapentin     Felt like I was out of my head, slept all day, confusion    Sulfa Antibiotics Itching    Current Outpatient Medications  Medication Sig Dispense Refill   allopurinol (ZYLOPRIM) 100 MG tablet Take 100 mg by mouth daily.     ALPRAZolam (XANAX) 0.5 MG tablet Take 2 tablets approximately 45 minutes prior to the MRI study, take a third tablet if needed. (Patient not taking: Reported on 08/06/2019) 3 tablet 0   ammonium lactate (LAC-HYDRIN) 12 % lotion Apply 1 application topically 2 (two) times daily as needed (leg swelling).      baclofen (LIORESAL) 10 MG tablet Take 10 mg by mouth 2 (two) times daily.      cloNIDine (CATAPRES) 0.1 MG tablet Take 0.1 mg by mouth daily.      HYDROcodone-acetaminophen (NORCO) 7.5-325 MG tablet Take 1 tablet by mouth 3 (three) times daily as needed for pain.     lidocaine (LIDODERM) 5 % Place 1 patch onto the skin daily. Remove & Discard patch within 12 hours or as directed by MD (Patient not taking: Reported on 08/06/2019) 30 patch 0   LOKELMA 10 g PACK packet Take 10 g by mouth 2 (two) times a week. On Mondays and Fridays     nebivolol (BYSTOLIC) 10 MG tablet Take 10 mg by mouth daily.     oxyCODONE-acetaminophen (PERCOCET) 5-325 MG tablet Take 1 tablet by mouth every 4 (four) hours as needed for severe pain. 20 tablet 0   rivaroxaban (XARELTO)  20 MG TABS tablet Take 20 mg by mouth every morning.      sodium bicarbonate 650 MG tablet Take 650 mg by mouth 2 (two) times daily.      tamsulosin (FLOMAX) 0.4 MG CAPS capsule Take 0.4 mg by mouth at bedtime.      No current facility-administered medications for this visit.     ROS:  See HPI Vitals:   09/10/19 1337  BP: (!) 185/76  Pulse: (!) 58  Resp: 20  Temp: 98 F (36.7 C)  SpO2: 100%    Physical Exam:  General appearance: WD, WN male in NAD Cardiac: RRR Respiratory: nonlabored Incision:  LUE incisions X 2 are well approximated without signs of infection Extremities:  LUE: moderate edema. Good thrill and bruit in graft. 2+ radial pulse. Motor function and sensation intact. 5/5 hand grip strength. No edema of RUE.       Assessment/Plan:  S/p Left upper arm AVG placement approximately four weeks ago with post-op edema. Patient now with need for HD. I discussed the case with Dr. Carlis Abbott. He recommends shuntogram to rule out central venous stenosis. Advised patient to try to elevated his arm above heart level as much as possible.  OK to cannulate graft for hemodialysis.  Barbie Banner, PA-C  09/10/2019 1:30 PM [facility] [phone]  Clinic MD:  Carlis Abbott

## 2019-09-10 NOTE — H&P (View-Only) (Signed)
POST OPERATIVE DIALYSIS ACCESS OFFICE NOTE    CC:  F/u for dialysis access surgery  HPI:  This is a 69 y.o. male who is s/p left upper arm dialysis graft on 08/15/2019 by Dr. Trula Slade.  The patient came to the office today complaining of left upper extremity swelling.  He is accompanied by his son.   He was not requiring dialysis pre-operatively, but he states he has been told he must now start dialysis.  He is concerned regarding access at this time of his graft due to the swelling.  He denies hand pain, fever or chills.  Maintained on Xarelto for history of DVT. Allergies  Allergen Reactions  . Gabapentin     Felt like I was out of my head, slept all day, confusion   . Sulfa Antibiotics Itching    Current Outpatient Medications  Medication Sig Dispense Refill  . allopurinol (ZYLOPRIM) 100 MG tablet Take 100 mg by mouth daily.    [name] ALPRAZolam (XANAX) 0.5 MG tablet Take 2 tablets approximately 45 minutes prior to the MRI study, take a third tablet if needed. (Patient not taking: Reported on 08/06/2019) 3 tablet 0  . ammonium lactate (LAC-HYDRIN) 12 % lotion Apply 1 application topically 2 (two) times daily as needed (leg swelling).     . baclofen (LIORESAL) 10 MG tablet Take 10 mg by mouth 2 (two) times daily.     . cloNIDine (CATAPRES) 0.1 MG tablet Take 0.1 mg by mouth daily.     [name] HYDROcodone-acetaminophen (NORCO) 7.5-325 MG tablet Take 1 tablet by mouth 3 (three) times daily as needed for pain.    [name] lidocaine (LIDODERM) 5 % Place 1 patch onto the skin daily. Remove & Discard patch within 12 hours or as directed by MD (Patient not taking: Reported on 08/06/2019) 30 patch 0  . LOKELMA 10 g PACK packet Take 10 g by mouth 2 (two) times a week. On Mondays and Fridays    . nebivolol (BYSTOLIC) 10 MG tablet Take 10 mg by mouth daily.    [name] oxyCODONE-acetaminophen (PERCOCET) 5-325 MG tablet Take 1 tablet by mouth every 4 (four) hours as needed for severe pain. 20 tablet 0  . rivaroxaban (XARELTO)  20 MG TABS tablet Take 20 mg by mouth every morning.     . sodium bicarbonate 650 MG tablet Take 650 mg by mouth 2 (two) times daily.     . tamsulosin (FLOMAX) 0.4 MG CAPS capsule Take 0.4 mg by mouth at bedtime.      No current facility-administered medications for this visit.     ROS:  See HPI Vitals:   09/10/19 1337  BP: (!) 185/76  Pulse: (!) 58  Resp: 20  Temp: 98 F (36.7 C)  SpO2: 100%    Physical Exam:  General appearance: WD, WN male in NAD Cardiac: RRR Respiratory: nonlabored Incision:  LUE incisions X 2 are well approximated without signs of infection Extremities:  LUE: moderate edema. Good thrill and bruit in graft. 2+ radial pulse. Motor function and sensation intact. 5/5 hand grip strength. No edema of RUE.       Assessment/Plan:  S/p Left upper arm AVG placement approximately four weeks ago with post-op edema. Patient now with need for HD. I discussed the case with Dr. Carlis Abbott. He recommends shuntogram to rule out central venous stenosis. Advised patient to try to elevated his arm above heart level as much as possible.  OK to cannulate graft for hemodialysis.  Barbie Banner, PA-C  09/10/2019 1:30 PM [facility] [phone]  Clinic MD:  Carlis Abbott

## 2019-09-14 ENCOUNTER — Other Ambulatory Visit (HOSPITAL_COMMUNITY)
Admission: RE | Admit: 2019-09-14 | Discharge: 2019-09-14 | Disposition: A | Discharge status: Home / Self Care | Payer: Medicare HMO | Source: Ambulatory Visit | Attending: Vascular Surgery | Admitting: Vascular Surgery

## 2019-09-14 DIAGNOSIS — Z20822 Contact with and (suspected) exposure to covid-19: Secondary | ICD-10-CM | POA: Insufficient documentation

## 2019-09-14 DIAGNOSIS — Z01812 Encounter for preprocedural laboratory examination: Secondary | ICD-10-CM | POA: Insufficient documentation

## 2019-09-14 LAB — SARS CORONAVIRUS 2 (TAT 6-24 HRS): SARS Coronavirus 2: NEGATIVE

## 2019-09-16 ENCOUNTER — Encounter (HOSPITAL_COMMUNITY)
Admission: RE | Disposition: A | Discharge status: Home / Self Care | Payer: Self-pay | Source: Home / Self Care | Attending: Vascular Surgery

## 2019-09-16 ENCOUNTER — Other Ambulatory Visit: Payer: Self-pay

## 2019-09-16 ENCOUNTER — Encounter (HOSPITAL_COMMUNITY): Payer: Self-pay | Admitting: Vascular Surgery

## 2019-09-16 ENCOUNTER — Ambulatory Visit (HOSPITAL_COMMUNITY)
Admission: RE | Admit: 2019-09-16 | Discharge: 2019-09-16 | Disposition: A | Discharge status: Home / Self Care | Payer: Medicare HMO | Attending: Vascular Surgery | Admitting: Vascular Surgery

## 2019-09-16 DIAGNOSIS — Z86718 Personal history of other venous thrombosis and embolism: Secondary | ICD-10-CM | POA: Insufficient documentation

## 2019-09-16 DIAGNOSIS — Z79899 Other long term (current) drug therapy: Secondary | ICD-10-CM | POA: Diagnosis not present

## 2019-09-16 DIAGNOSIS — Y841 Kidney dialysis as the cause of abnormal reaction of the patient, or of later complication, without mention of misadventure at the time of the procedure: Secondary | ICD-10-CM | POA: Diagnosis not present

## 2019-09-16 DIAGNOSIS — Z7901 Long term (current) use of anticoagulants: Secondary | ICD-10-CM | POA: Insufficient documentation

## 2019-09-16 DIAGNOSIS — Z992 Dependence on renal dialysis: Secondary | ICD-10-CM | POA: Insufficient documentation

## 2019-09-16 DIAGNOSIS — N186 End stage renal disease: Secondary | ICD-10-CM | POA: Insufficient documentation

## 2019-09-16 DIAGNOSIS — Z882 Allergy status to sulfonamides status: Secondary | ICD-10-CM | POA: Diagnosis not present

## 2019-09-16 DIAGNOSIS — Z888 Allergy status to other drugs, medicaments and biological substances status: Secondary | ICD-10-CM | POA: Diagnosis not present

## 2019-09-16 DIAGNOSIS — T82858A Stenosis of vascular prosthetic devices, implants and grafts, initial encounter: Secondary | ICD-10-CM | POA: Diagnosis not present

## 2019-09-16 DIAGNOSIS — T82898A Other specified complication of vascular prosthetic devices, implants and grafts, initial encounter: Secondary | ICD-10-CM | POA: Diagnosis not present

## 2019-09-16 HISTORY — PX: A/V SHUNTOGRAM: CATH118297

## 2019-09-16 HISTORY — PX: PERIPHERAL VASCULAR INTERVENTION: CATH118257

## 2019-09-16 LAB — POCT I-STAT, CHEM 8
BUN: 85 mg/dL — ABNORMAL HIGH (ref 8–23)
Calcium, Ion: 1.16 mmol/L (ref 1.15–1.40)
Chloride: 114 mmol/L — ABNORMAL HIGH (ref 98–111)
Creatinine, Ser: 7.4 mg/dL — ABNORMAL HIGH (ref 0.61–1.24)
Glucose, Bld: 82 mg/dL (ref 70–99)
HCT: 24 % — ABNORMAL LOW (ref 39.0–52.0)
Hemoglobin: 8.2 g/dL — ABNORMAL LOW (ref 13.0–17.0)
Potassium: 5.1 mmol/L (ref 3.5–5.1)
Sodium: 142 mmol/L (ref 135–145)
TCO2: 18 mmol/L — ABNORMAL LOW (ref 22–32)

## 2019-09-16 SURGERY — A/V SHUNTOGRAM
Anesthesia: LOCAL

## 2019-09-16 MED ORDER — SODIUM CHLORIDE 0.9% FLUSH: 3.0000 mL | INTRAVENOUS | Status: DC | PRN

## 2019-09-16 MED ORDER — OXYCODONE-ACETAMINOPHEN 5-325 MG PO TABS
ORAL_TABLET | ORAL | Status: AC
  Filled 2019-09-16: qty 1

## 2019-09-16 MED ORDER — HEPARIN (PORCINE) IN NACL 1000-0.9 UT/500ML-% IV SOLN
INTRAVENOUS | Status: AC
  Filled 2019-09-16: qty 500

## 2019-09-16 MED ORDER — SODIUM CHLORIDE 0.9 % IV SOLN: 250.0000 mL | INTRAVENOUS | Status: DC | PRN

## 2019-09-16 MED ORDER — HEPARIN (PORCINE) IN NACL 1000-0.9 UT/500ML-% IV SOLN
INTRAVENOUS | Status: DC | PRN
  Administered 2019-09-16: 500 mL

## 2019-09-16 MED ORDER — IODIXANOL 320 MG/ML IV SOLN
INTRAVENOUS | Status: DC | PRN
  Administered 2019-09-16: 20 mL

## 2019-09-16 MED ORDER — LIDOCAINE HCL (PF) 1 % IJ SOLN
INTRAMUSCULAR | Status: AC
  Filled 2019-09-16: qty 30

## 2019-09-16 MED ORDER — OXYCODONE-ACETAMINOPHEN 5-325 MG PO TABS
1.0000 | ORAL_TABLET | Freq: Once | ORAL | Status: AC
  Administered 2019-09-16: 1 via ORAL

## 2019-09-16 MED ORDER — SODIUM CHLORIDE 0.9% FLUSH: 3.0000 mL | Freq: Two times a day (BID) | INTRAVENOUS | Status: DC

## 2019-09-16 MED ORDER — LIDOCAINE HCL (PF) 1 % IJ SOLN
INTRAMUSCULAR | Status: DC | PRN
  Administered 2019-09-16: 2 mL

## 2019-09-16 SURGICAL SUPPLY — 16 items
BAG SNAP BAND KOVER 36X36 (MISCELLANEOUS) ×3 IMPLANT
BALLN STERLING OTW 7X40X135 (BALLOONS) ×3
BALLOON STERLING OTW 7X40X135 (BALLOONS) ×2 IMPLANT
COVER DOME SNAP 22 D (MISCELLANEOUS) ×3 IMPLANT
KIT ENCORE 26 ADVANTAGE (KITS) ×3 IMPLANT
KIT MICROPUNCTURE NIT STIFF (SHEATH) ×3 IMPLANT
PROTECTION STATION PRESSURIZED (MISCELLANEOUS) ×3
SHEATH PINNACLE R/O II 7F 4CM (SHEATH) ×3 IMPLANT
SHEATH PROBE COVER 6X72 (BAG) ×3 IMPLANT
STATION PROTECTION PRESSURIZED (MISCELLANEOUS) ×2 IMPLANT
STENT VIABAHN 7X50X120 (Permanent Stent) ×1 IMPLANT
STENT VIABAHN 7X5X120 7FR (Permanent Stent) ×2 IMPLANT
STOPCOCK MORSE 400PSI 3WAY (MISCELLANEOUS) ×3 IMPLANT
TRAY PV CATH (CUSTOM PROCEDURE TRAY) ×3 IMPLANT
TUBING CIL FLEX 10 FLL-RA (TUBING) ×3 IMPLANT
WIRE SPARTACORE .014X300CM (WIRE) ×3 IMPLANT

## 2019-09-16 NOTE — Interval H&P Note (Signed)
History and Physical Interval Note:  09/16/2019 7:21 AM  [name]  has presented today for surgery, with the diagnosis of instage renal.  The various methods of treatment have been discussed with the patient and family. After consideration of risks, benefits and other options for treatment, the patient has consented to  Procedure(s): A/V SHUNTOGRAM - Left Arm (N/A) as a surgical intervention.  The patient's history has been reviewed, patient examined, no change in status, stable for surgery.  I have reviewed the patient's chart and labs.  Questions were answered to the patient's satisfaction.     Servando Snare

## 2019-09-16 NOTE — Op Note (Signed)
    Patient name: Duane Ortiz MRN: [id number] DOB: Feb 24, 1950 Sex: male  09/16/2019 Pre-operative Diagnosis: esrd, swelling left upper extremity Post-operative diagnosis:  Same Surgeon:  Eda Paschal. Donzetta Matters, MD Procedure Performed: 1.  Ultrasound-guided cannulation left arm AV graft 2.  With the upper extremity and central fistulogram 3.  Stent of graft venous anastomosis with 7 x 50 mm Viabahn   Indications: 69 year old male with chronic kidney disease now considered in need of dialysis has swelling of his left upper extremity following left arm AV graft placement.  He is now indicated for fistulogram and possible intervention.  Findings: The graft itself is patent.  There is an end to end anastomosis to the axillary vein.  There was approximately 60% stenosis at the outflow.  Central veins are patent.  After stent placement there is less than 20% residual stenosis.  Stent was balloon dilated to 7 mm and retrograde imaging demonstrated patent arterial anastomosis.   Procedure:  The patient was identified in the holding area and taken to room 8.  The patient was then placed supine on the table and prepped and draped in the usual sterile fashion.  A time out was called.  Ultrasound was used to evaluate the left arm AV graft.  The area was anesthetized 1% lidocaine cannulated with micropuncture needle followed the wire sheath.  This was done with ultrasound guidance and an image was saved the permanent record.  We placed a micropuncture sheath performed fistulogram as well as central venography.  With the above findings we elected to primarily stent.  We placed the long J-wire placed a 7 French sheath.  We then crossed with an 014 wire.  We primarily placed a 7 x 50 mm Viabahn.  This was postdilated with 7 mm Sterling balloon.  While there was inflated we perform retrograde imaging which demonstrated patent arterial anastomosis.  Satisfied we allowed our balloon down after 1 minute of inflation.   Completion demonstrated less than 20% residual stenosis.  There was a very strong thrill.  The cannulation site was again anesthetized 1% lidocaine.  A 4-0 Monocryl suture was placed and sheath and wire were removed.  The patient tolerated procedure without any complication.  Contrast: 20 cc   Ruari Mudgett C. Donzetta Matters, MD [facility] Office: [phone] Pager: [phone]

## 2019-09-16 NOTE — Discharge Instructions (Signed)

## 2019-09-17 ENCOUNTER — Encounter (HOSPITAL_COMMUNITY): Payer: Self-pay | Admitting: Vascular Surgery

## 2019-09-20 ENCOUNTER — Encounter (HOSPITAL_COMMUNITY): Payer: Self-pay | Admitting: Pediatrics

## 2019-09-20 ENCOUNTER — Emergency Department (HOSPITAL_BASED_OUTPATIENT_CLINIC_OR_DEPARTMENT_OTHER): Payer: Medicare HMO

## 2019-09-20 ENCOUNTER — Other Ambulatory Visit: Payer: Self-pay

## 2019-09-20 ENCOUNTER — Inpatient Hospital Stay (HOSPITAL_COMMUNITY)
Admission: EM | Admit: 2019-09-20 | Discharge: 2019-09-24 | DRG: 682 | Disposition: A | Discharge status: Home / Self Care | Payer: Medicare HMO | Source: Ambulatory Visit | Attending: Internal Medicine | Admitting: Internal Medicine

## 2019-09-20 DIAGNOSIS — Z882 Allergy status to sulfonamides status: Secondary | ICD-10-CM

## 2019-09-20 DIAGNOSIS — I120 Hypertensive chronic kidney disease with stage 5 chronic kidney disease or end stage renal disease: Principal | ICD-10-CM | POA: Diagnosis present

## 2019-09-20 DIAGNOSIS — I10 Essential (primary) hypertension: Secondary | ICD-10-CM | POA: Diagnosis present

## 2019-09-20 DIAGNOSIS — D689 Coagulation defect, unspecified: Secondary | ICD-10-CM | POA: Diagnosis present

## 2019-09-20 DIAGNOSIS — N2581 Secondary hyperparathyroidism of renal origin: Secondary | ICD-10-CM | POA: Diagnosis present

## 2019-09-20 DIAGNOSIS — M7989 Other specified soft tissue disorders: Secondary | ICD-10-CM

## 2019-09-20 DIAGNOSIS — D631 Anemia in chronic kidney disease: Secondary | ICD-10-CM | POA: Diagnosis present

## 2019-09-20 DIAGNOSIS — F17210 Nicotine dependence, cigarettes, uncomplicated: Secondary | ICD-10-CM | POA: Diagnosis present

## 2019-09-20 DIAGNOSIS — E559 Vitamin D deficiency, unspecified: Secondary | ICD-10-CM | POA: Diagnosis present

## 2019-09-20 DIAGNOSIS — N186 End stage renal disease: Secondary | ICD-10-CM | POA: Diagnosis present

## 2019-09-20 DIAGNOSIS — E1129 Type 2 diabetes mellitus with other diabetic kidney complication: Secondary | ICD-10-CM | POA: Diagnosis present

## 2019-09-20 DIAGNOSIS — N179 Acute kidney failure, unspecified: Secondary | ICD-10-CM

## 2019-09-20 DIAGNOSIS — E1122 Type 2 diabetes mellitus with diabetic chronic kidney disease: Secondary | ICD-10-CM | POA: Diagnosis present

## 2019-09-20 DIAGNOSIS — E669 Obesity, unspecified: Secondary | ICD-10-CM | POA: Diagnosis present

## 2019-09-20 DIAGNOSIS — Z8249 Family history of ischemic heart disease and other diseases of the circulatory system: Secondary | ICD-10-CM

## 2019-09-20 DIAGNOSIS — Z833 Family history of diabetes mellitus: Secondary | ICD-10-CM

## 2019-09-20 DIAGNOSIS — Z6829 Body mass index (BMI) 29.0-29.9, adult: Secondary | ICD-10-CM

## 2019-09-20 DIAGNOSIS — E875 Hyperkalemia: Secondary | ICD-10-CM | POA: Diagnosis not present

## 2019-09-20 DIAGNOSIS — Z888 Allergy status to other drugs, medicaments and biological substances status: Secondary | ICD-10-CM

## 2019-09-20 DIAGNOSIS — Z992 Dependence on renal dialysis: Secondary | ICD-10-CM

## 2019-09-20 DIAGNOSIS — E872 Acidosis: Secondary | ICD-10-CM | POA: Diagnosis present

## 2019-09-20 DIAGNOSIS — G4730 Sleep apnea, unspecified: Secondary | ICD-10-CM | POA: Diagnosis present

## 2019-09-20 DIAGNOSIS — E1142 Type 2 diabetes mellitus with diabetic polyneuropathy: Secondary | ICD-10-CM | POA: Diagnosis present

## 2019-09-20 DIAGNOSIS — Z20822 Contact with and (suspected) exposure to covid-19: Secondary | ICD-10-CM | POA: Diagnosis present

## 2019-09-20 DIAGNOSIS — Z7901 Long term (current) use of anticoagulants: Secondary | ICD-10-CM

## 2019-09-20 DIAGNOSIS — Z79899 Other long term (current) drug therapy: Secondary | ICD-10-CM

## 2019-09-20 DIAGNOSIS — E663 Overweight: Secondary | ICD-10-CM | POA: Diagnosis present

## 2019-09-20 DIAGNOSIS — M109 Gout, unspecified: Secondary | ICD-10-CM | POA: Diagnosis present

## 2019-09-20 DIAGNOSIS — K219 Gastro-esophageal reflux disease without esophagitis: Secondary | ICD-10-CM | POA: Diagnosis present

## 2019-09-20 DIAGNOSIS — M79622 Pain in left upper arm: Secondary | ICD-10-CM | POA: Diagnosis not present

## 2019-09-20 DIAGNOSIS — M1990 Unspecified osteoarthritis, unspecified site: Secondary | ICD-10-CM | POA: Diagnosis present

## 2019-09-20 DIAGNOSIS — E7800 Pure hypercholesterolemia, unspecified: Secondary | ICD-10-CM | POA: Diagnosis present

## 2019-09-20 DIAGNOSIS — Z972 Presence of dental prosthetic device (complete) (partial): Secondary | ICD-10-CM

## 2019-09-20 DIAGNOSIS — M898X9 Other specified disorders of bone, unspecified site: Secondary | ICD-10-CM | POA: Diagnosis present

## 2019-09-20 DIAGNOSIS — Z86711 Personal history of pulmonary embolism: Secondary | ICD-10-CM

## 2019-09-20 LAB — CBC WITH DIFFERENTIAL/PLATELET
Abs Immature Granulocytes: 0.02 10*3/uL (ref 0.00–0.07)
Basophils Absolute: 0 10*3/uL (ref 0.0–0.1)
Basophils Relative: 1 %
Eosinophils Absolute: 0.3 10*3/uL (ref 0.0–0.5)
Eosinophils Relative: 5 %
HCT: 25 % — ABNORMAL LOW (ref 39.0–52.0)
Hemoglobin: 7.5 g/dL — ABNORMAL LOW (ref 13.0–17.0)
Immature Granulocytes: 0 %
Lymphocytes Relative: 24 %
Lymphs Abs: 1.3 10*3/uL (ref 0.7–4.0)
MCH: 27.7 pg (ref 26.0–34.0)
MCHC: 30 g/dL (ref 30.0–36.0)
MCV: 92.3 fL (ref 80.0–100.0)
Monocytes Absolute: 0.5 10*3/uL (ref 0.1–1.0)
Monocytes Relative: 9 %
Neutro Abs: 3.5 10*3/uL (ref 1.7–7.7)
Neutrophils Relative %: 61 %
Platelets: 176 10*3/uL (ref 150–400)
RBC: 2.71 MIL/uL — ABNORMAL LOW (ref 4.22–5.81)
RDW: 13.2 % (ref 11.5–15.5)
WBC: 5.6 10*3/uL (ref 4.0–10.5)
nRBC: 0 % (ref 0.0–0.2)

## 2019-09-20 LAB — I-STAT CHEM 8, ED
BUN: 92 mg/dL — ABNORMAL HIGH (ref 8–23)
Calcium, Ion: 1.11 mmol/L — ABNORMAL LOW (ref 1.15–1.40)
Chloride: 116 mmol/L — ABNORMAL HIGH (ref 98–111)
Creatinine, Ser: 7.4 mg/dL — ABNORMAL HIGH (ref 0.61–1.24)
Glucose, Bld: 115 mg/dL — ABNORMAL HIGH (ref 70–99)
HCT: 23 % — ABNORMAL LOW (ref 39.0–52.0)
Hemoglobin: 7.8 g/dL — ABNORMAL LOW (ref 13.0–17.0)
Potassium: 5.9 mmol/L — ABNORMAL HIGH (ref 3.5–5.1)
Sodium: 141 mmol/L (ref 135–145)
TCO2: 15 mmol/L — ABNORMAL LOW (ref 22–32)

## 2019-09-20 LAB — COMPREHENSIVE METABOLIC PANEL
ALT: 88 U/L — ABNORMAL HIGH (ref 0–44)
AST: 61 U/L — ABNORMAL HIGH (ref 15–41)
Albumin: 3 g/dL — ABNORMAL LOW (ref 3.5–5.0)
Alkaline Phosphatase: 63 U/L (ref 38–126)
Anion gap: 10 (ref 5–15)
BUN: 91 mg/dL — ABNORMAL HIGH (ref 8–23)
CO2: 16 mmol/L — ABNORMAL LOW (ref 22–32)
Calcium: 8.1 mg/dL — ABNORMAL LOW (ref 8.9–10.3)
Chloride: 112 mmol/L — ABNORMAL HIGH (ref 98–111)
Creatinine, Ser: 7.04 mg/dL — ABNORMAL HIGH (ref 0.61–1.24)
GFR calc Af Amer: 8 mL/min — ABNORMAL LOW (ref >60)
GFR calc non Af Amer: 7 mL/min — ABNORMAL LOW (ref >60)
Glucose, Bld: 126 mg/dL — ABNORMAL HIGH (ref 70–99)
Potassium: 6 mmol/L — ABNORMAL HIGH (ref 3.5–5.1)
Sodium: 138 mmol/L (ref 135–145)
Total Bilirubin: 0.3 mg/dL (ref 0.3–1.2)
Total Protein: 5.9 g/dL — ABNORMAL LOW (ref 6.5–8.1)

## 2019-09-20 NOTE — ED Triage Notes (Signed)
Emergency Medicine Provider Triage Evaluation Note  Shahram Alexopoulos , a 69 y.o. male  was evaluated in triage.  Pt sent by nephrologist for dialysis.  Patient states he has never had dialysis.  He had an AV fistula put in recently, but then it was blocked, revised earlier this week.  He has never been used.  He reports normal urination.  He has left arm swelling since the graft has been placed, worsening over the past several days.  No additional swelling in any of his other extremities.    Review of Systems  Positive: Left arm swelling, needs dialysis Negative: No change in urination.  No shortness of breath or chest pain  Physical Exam  BP (!) 160/77    Pulse 64    Temp 97.9 F (36.6 C) (Oral)    Resp 16    SpO2 100%  Gen:   Awake, no distress   HEENT:  Atraumatic  Resp:  Normal effort  Cardiac:  Normal rate  Abd:   Nondistended, nontender  MSK:   Moves extremities without difficulty.  Left arm warm and swollen.  Radial pulses 2+ bilaterally.  AV fistula with good thrill, no signs of purulence.  Bilateral mild lower extremity edema, equal. Neuro:  Speech clear   Medical Decision Making  Medically screening exam initiated at 1:58 PM.  Appropriate orders placed.  Tod Abrahamsen was informed that the remainder of the evaluation will be completed by another provider, this initial triage assessment does not replace that evaluation, and the importance of remaining in the ED until their evaluation is complete.  Clinical Impression   Patient sent by nephrologist for dialysis.  Does not have a functional fistula site at this time.  Additionally, patient is left upper extremity is swollen and warm.  Will obtain DVT ultrasound.  Will obtain labs to assess for electrolytes and kidney function.   Franchot Heidelberg, PA-C 09/20/19 1401

## 2019-09-20 NOTE — ED Triage Notes (Signed)
Patient stated he was sent here by his kidney doctor because he needs dialysis. Stated his access on left upper arm are swollen and it was never used and he hasn't had dialysis before.

## 2019-09-20 NOTE — Progress Notes (Signed)
Left upper extremity venous duplex and left upper extremity AVG duplex completed. Refer to "CV Proc" under chart review to view preliminary results.  09/20/2019 3:14 PM Kelby Aline., MHA, RVT, RDCS, RDMS

## 2019-09-20 NOTE — ED Notes (Signed)
PT SON IS WAITING [phone] Duane Ortiz PLEASE CONTACT WITH ANY UPDATE

## 2019-09-21 DIAGNOSIS — I10 Essential (primary) hypertension: Secondary | ICD-10-CM | POA: Diagnosis not present

## 2019-09-21 DIAGNOSIS — Z992 Dependence on renal dialysis: Secondary | ICD-10-CM

## 2019-09-21 DIAGNOSIS — N179 Acute kidney failure, unspecified: Secondary | ICD-10-CM | POA: Diagnosis not present

## 2019-09-21 DIAGNOSIS — N186 End stage renal disease: Secondary | ICD-10-CM | POA: Diagnosis not present

## 2019-09-21 DIAGNOSIS — E1122 Type 2 diabetes mellitus with diabetic chronic kidney disease: Secondary | ICD-10-CM

## 2019-09-21 DIAGNOSIS — D689 Coagulation defect, unspecified: Secondary | ICD-10-CM

## 2019-09-21 DIAGNOSIS — E875 Hyperkalemia: Secondary | ICD-10-CM | POA: Diagnosis present

## 2019-09-21 LAB — CBC
HCT: 27.7 % — ABNORMAL LOW (ref 39.0–52.0)
Hemoglobin: 8.2 g/dL — ABNORMAL LOW (ref 13.0–17.0)
MCH: 27.2 pg (ref 26.0–34.0)
MCHC: 29.6 g/dL — ABNORMAL LOW (ref 30.0–36.0)
MCV: 91.7 fL (ref 80.0–100.0)
Platelets: 200 10*3/uL (ref 150–400)
RBC: 3.02 MIL/uL — ABNORMAL LOW (ref 4.22–5.81)
RDW: 13.2 % (ref 11.5–15.5)
WBC: 5.6 10*3/uL (ref 4.0–10.5)
nRBC: 0 % (ref 0.0–0.2)

## 2019-09-21 LAB — BASIC METABOLIC PANEL
Anion gap: 10 (ref 5–15)
BUN: 89 mg/dL — ABNORMAL HIGH (ref 8–23)
CO2: 17 mmol/L — ABNORMAL LOW (ref 22–32)
Calcium: 8.5 mg/dL — ABNORMAL LOW (ref 8.9–10.3)
Chloride: 113 mmol/L — ABNORMAL HIGH (ref 98–111)
Creatinine, Ser: 6.95 mg/dL — ABNORMAL HIGH (ref 0.61–1.24)
GFR calc Af Amer: 8 mL/min — ABNORMAL LOW (ref >60)
GFR calc non Af Amer: 7 mL/min — ABNORMAL LOW (ref >60)
Glucose, Bld: 89 mg/dL (ref 70–99)
Potassium: 6 mmol/L — ABNORMAL HIGH (ref 3.5–5.1)
Sodium: 140 mmol/L (ref 135–145)

## 2019-09-21 LAB — SARS CORONAVIRUS 2 BY RT PCR (HOSPITAL ORDER, PERFORMED IN ~~LOC~~ HOSPITAL LAB): SARS Coronavirus 2: NEGATIVE

## 2019-09-21 LAB — HEPARIN LEVEL (UNFRACTIONATED)
Heparin Unfractionated: 0.72 IU/mL — ABNORMAL HIGH (ref 0.30–0.70)
Heparin Unfractionated: 0.44 IU/mL (ref 0.30–0.70)

## 2019-09-21 LAB — FERRITIN: Ferritin: 134 ng/mL (ref 24–336)

## 2019-09-21 LAB — IRON AND TIBC
Iron: 48 ug/dL (ref 45–182)
Saturation Ratios: 20 % (ref 17.9–39.5)
TIBC: 235 ug/dL — ABNORMAL LOW (ref 250–450)
UIBC: 187 ug/dL

## 2019-09-21 LAB — GLUCOSE, CAPILLARY
Glucose-Capillary: 106 mg/dL — ABNORMAL HIGH (ref 70–99)
Glucose-Capillary: 122 mg/dL — ABNORMAL HIGH (ref 70–99)

## 2019-09-21 LAB — HEPATITIS B SURFACE ANTIBODY,QUALITATIVE: Hep B S Ab: NONREACTIVE

## 2019-09-21 LAB — HEPATITIS B SURFACE ANTIGEN: Hepatitis B Surface Ag: NONREACTIVE

## 2019-09-21 LAB — APTT: aPTT: 164 seconds (ref 24–36)

## 2019-09-21 LAB — HEPATITIS B CORE ANTIBODY, TOTAL: Hep B Core Total Ab: NONREACTIVE

## 2019-09-21 LAB — PHOSPHORUS: Phosphorus: 5.4 mg/dL — ABNORMAL HIGH (ref 2.5–4.6)

## 2019-09-21 MED ORDER — CLONIDINE HCL 0.1 MG PO TABS
0.1000 mg | ORAL_TABLET | ORAL | Status: AC
  Administered 2019-09-21: 0.1 mg via ORAL
  Filled 2019-09-21 (×2): qty 1

## 2019-09-21 MED ORDER — ONDANSETRON HCL 4 MG/2ML IJ SOLN: 4.0000 mg | Freq: Four times a day (QID) | INTRAMUSCULAR | Status: DC | PRN

## 2019-09-21 MED ORDER — LIDOCAINE-PRILOCAINE 2.5-2.5 % EX CREA: 1.0000 "application " | TOPICAL_CREAM | CUTANEOUS | Status: DC | PRN

## 2019-09-21 MED ORDER — TAMSULOSIN HCL 0.4 MG PO CAPS
0.4000 mg | ORAL_CAPSULE | Freq: Every day | ORAL | Status: DC
  Administered 2019-09-21 – 2019-09-23 (×3): 0.4 mg via ORAL
  Filled 2019-09-21 (×3): qty 1

## 2019-09-21 MED ORDER — CHLORHEXIDINE GLUCONATE CLOTH 2 % EX PADS: 6.0000 | MEDICATED_PAD | Freq: Every day | CUTANEOUS | Status: DC

## 2019-09-21 MED ORDER — HEPARIN SODIUM (PORCINE) 1000 UNIT/ML DIALYSIS: 1000.0000 [IU] | INTRAMUSCULAR | Status: DC | PRN

## 2019-09-21 MED ORDER — ACETAMINOPHEN 650 MG RE SUPP: 650.0000 mg | Freq: Four times a day (QID) | RECTAL | Status: DC | PRN

## 2019-09-21 MED ORDER — ONDANSETRON HCL 4 MG PO TABS: 4.0000 mg | ORAL_TABLET | Freq: Four times a day (QID) | ORAL | Status: DC | PRN

## 2019-09-21 MED ORDER — CLONIDINE HCL 0.1 MG PO TABS
0.1000 mg | ORAL_TABLET | Freq: Three times a day (TID) | ORAL | Status: DC
  Administered 2019-09-21 – 2019-09-24 (×6): 0.1 mg via ORAL
  Filled 2019-09-21 (×7): qty 1

## 2019-09-21 MED ORDER — HEPARIN (PORCINE) 25000 UT/250ML-% IV SOLN
1300.0000 [IU]/h | INTRAVENOUS | Status: DC
  Administered 2019-09-21: 1300 [IU]/h via INTRAVENOUS
  Filled 2019-09-21: qty 250

## 2019-09-21 MED ORDER — CLONIDINE HCL 0.1 MG PO TABS
0.1000 mg | ORAL_TABLET | Freq: Once | ORAL | Status: AC
  Administered 2019-09-21: 0.1 mg via ORAL
  Filled 2019-09-21: qty 1

## 2019-09-21 MED ORDER — HEPARIN BOLUS VIA INFUSION
2000.0000 [IU] | Freq: Once | INTRAVENOUS | Status: AC
  Administered 2019-09-21: 2000 [IU] via INTRAVENOUS
  Filled 2019-09-21: qty 2000

## 2019-09-21 MED ORDER — NEBIVOLOL HCL 5 MG PO TABS
10.0000 mg | ORAL_TABLET | Freq: Every day | ORAL | Status: DC
  Administered 2019-09-21 – 2019-09-23 (×3): 10 mg via ORAL
  Filled 2019-09-21 (×3): qty 2
  Filled 2019-09-21: qty 1

## 2019-09-21 MED ORDER — SODIUM CHLORIDE 0.9 % IV SOLN: 100.0000 mL | INTRAVENOUS | Status: DC | PRN

## 2019-09-21 MED ORDER — HEPARIN (PORCINE) 25000 UT/250ML-% IV SOLN
1150.0000 [IU]/h | INTRAVENOUS | Status: AC
  Administered 2019-09-22 (×2): 1200 [IU]/h via INTRAVENOUS
  Filled 2019-09-21 (×3): qty 250

## 2019-09-21 MED ORDER — PENTAFLUOROPROP-TETRAFLUOROETH EX AERO: 1.0000 "application " | INHALATION_SPRAY | CUTANEOUS | Status: DC | PRN

## 2019-09-21 MED ORDER — ACETAMINOPHEN 325 MG PO TABS
650.0000 mg | ORAL_TABLET | Freq: Four times a day (QID) | ORAL | Status: DC | PRN
  Administered 2019-09-21 – 2019-09-22 (×3): 650 mg via ORAL
  Filled 2019-09-21 (×3): qty 2

## 2019-09-21 MED ORDER — LIDOCAINE HCL (PF) 1 % IJ SOLN: 5.0000 mL | INTRAMUSCULAR | Status: DC | PRN

## 2019-09-21 MED ORDER — CLONIDINE HCL 0.1 MG PO TABS
0.1000 mg | ORAL_TABLET | Freq: Every day | ORAL | Status: DC
  Administered 2019-09-21: 0.1 mg via ORAL
  Filled 2019-09-21: qty 1

## 2019-09-21 MED ORDER — NEBIVOLOL HCL 10 MG PO TABS
10.0000 mg | ORAL_TABLET | Freq: Every day | ORAL | Status: DC
  Filled 2019-09-21: qty 1

## 2019-09-21 MED ORDER — SODIUM ZIRCONIUM CYCLOSILICATE 10 G PO PACK
10.0000 g | PACK | ORAL | Status: AC
  Administered 2019-09-21: 10 g via ORAL
  Filled 2019-09-21: qty 1

## 2019-09-21 MED ORDER — AMLODIPINE BESYLATE 5 MG PO TABS
5.0000 mg | ORAL_TABLET | Freq: Every day | ORAL | Status: DC
  Administered 2019-09-21 – 2019-09-24 (×4): 5 mg via ORAL
  Filled 2019-09-21 (×5): qty 1

## 2019-09-21 NOTE — ED Notes (Signed)
nephrology MD at bedside

## 2019-09-21 NOTE — Care Management Obs Status (Signed)
[facility] NOTIFICATION   Patient Details  Name: Duane Ortiz MRN: [id number] Date of Birth: [birth year]   Medicare Observation Status Notification Given:  Yes    Claudie Leach, RN 09/21/2019, 6:43 PM

## 2019-09-21 NOTE — ED Notes (Signed)
hospitalist at bedside

## 2019-09-21 NOTE — ED Notes (Signed)
Critical Aptt 164 seconds received- Duane Browns MD notified- ordered to hold heparin for 1 hour; This nurse also notified attending Spongberg MD- who states to follow pharmacy protocols. No new orders given. Dialysis RN made aware.

## 2019-09-21 NOTE — ED Provider Notes (Signed)
[facility] EMERGENCY DEPARTMENT Provider Note   CSN: [id number] Arrival date & time: 09/20/19  1324     History Chief Complaint  Patient presents with  . Dialysis    Duane Ortiz is a 69 y.o. male.  Patient was apparently sent from his dialysis center to get dialysis here.  History was obtained from the patient, notes and his son.  It sounds like the patient was post you start dialysis a week or 2 ago however he could not find a viable ride to [facility] where it was scheduled he was try to switch to [facility] and someone there told him the only way to get dialysis was to come to emergency room.  During all of this time the patient also was having issues with his new graft.  X-ray to fistulogram and will angioplasty done few days ago by Dr. Donzetta Matters.  His arm is still a little bit swollen.  Some pain associated with swelling but no other associated symptoms.  Patient without any syncope.  No headache, shortness of breath or worsening lower extremity swelling.  He does have some baseline edema.        Past Medical History:  Diagnosis Date  . Arthritis   . Chronic kidney disease   . Chronic kidney disease   . Diabetes ([place]) 02/012017  . Fainting   . Gait abnormality 10/11/2016  . GERD (gastroesophageal reflux disease)   . Gout   . Headache   . Hypercholesteremia   . Hypercholesterolemia   . Hypertension   . Peripheral neuropathy 04/02/2019  . Pulmonary embolism ([place])   . Shortness of breath   . Sleep apnea    does wear cpap  . Wears dentures     Patient Active Problem List   Diagnosis Date Noted  . ESRD (end stage renal disease) ([place]) 09/21/2019  . Hyperkalemia, diminished renal excretion 09/21/2019  . Anemia 08/28/2019  . Disorder of mineral metabolism, unspecified 08/28/2019  . Hyperkalemia 08/28/2019  . Hypomagnesemia 08/28/2019  . Metabolic acidosis 97/67/3419  . Mycosis 08/28/2019  . Tinea pedis 08/28/2019  . Peripheral neuropathy 04/02/2019    . Coagulation defect ([place]) 07/12/2018  . History of pulmonary embolism 07/12/2018  . Insomnia 07/12/2018  . Lumbar disc disease with radiculopathy 07/12/2018  . GERD (gastroesophageal reflux disease) 04/24/2018  . 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 Allergic rhinitis 03/01/2018  . Unstable gait 03/01/2018  . Chronic lower back pain 02/13/2018  . Gout involving toe of left foot 02/13/2018  . History of alcohol abuse 02/13/2018  . Obese 02/13/2018  . Vitamin D deficiency 02/13/2018  . Essential hypertension 04/27/2017  . Type 2 diabetes mellitus with renal complication ([place]) 37/90/2409  . Gait abnormality 10/11/2016  . Diabetic peripheral neuropathy ([place]) 08/25/2016  . Disc degeneration, lumbar 08/25/2016  . 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 Dyspnea on exertion 05/22/2015  . Hypercholesteremia   . Gout   . Pulmonary embolism ([place])   . Chronic kidney disease   . Hypercholesterolemia     Past Surgical History:  Procedure Laterality Date  . A/V SHUNTOGRAM N/A 09/16/2019   Procedure: A/V SHUNTOGRAM - Left Arm;  Surgeon: Waynetta Sandy, MD;  Location: [facility];  Service: Cardiovascular;  Laterality: N/A;  . AV FISTULA PLACEMENT Left 08/15/2019   Procedure: LEFT ARM ARTERIOVENOUS GORE-TEX GRAFT;  Surgeon: Serafina Mitchell, MD;  Location: [facility];  Service: Vascular;  Laterality: Left;  . CERVICAL DISCECTOMY    . COLONOSCOPY    . MULTIPLE TOOTH EXTRACTIONS    . neck spine disk surgery  01/03/1993  . PERIPHERAL VASCULAR INTERVENTION Left 09/16/2019   Procedure: PERIPHERAL VASCULAR INTERVENTION;  Surgeon: Waynetta Sandy, MD;  Location: [facility];  Service: Cardiovascular;  Laterality: Left;  ARM FISTULS  . WRIST SURGERY         Family History  Problem Relation Age of Onset  . Diabetes Mother   . Heart disease Mother   . Hypertension Mother   . Diabetes  Sister   . Hypertension Sister   . Diabetes Brother   . Hypertension Brother   . Breast cancer Paternal Grandmother     Social History   Tobacco Use  . Smoking status: Current Some Day Smoker    Packs/day: 0.25    Types: Cigarettes  . Smokeless tobacco: Never Used  Vaping Use  . Vaping Use: Never used  Substance Use Topics  . Alcohol use: Not Currently    Alcohol/week: 0.0 standard drinks  . Drug use: No    Home Medications Prior to Admission medications   Medication Sig Start Date End Date Taking? Authorizing Provider  allopurinol (ZYLOPRIM) 100 MG tablet Take 100 mg by mouth daily.    [provider]  ALPRAZolam Duanne Moron) 0.5 MG tablet Take 2 tablets approximately 45 minutes prior to the MRI study, take a third tablet if needed. Patient not taking: Reported on 08/06/2019 01/15/19   Kathrynn Ducking, MD  baclofen (LIORESAL) 10 MG tablet Take 10 mg by mouth 2 (two) times daily as needed for muscle spasms.  04/27/18   [provider]  calcitRIOL (ROCALTROL) 0.25 MCG capsule Take 0.25 mcg by mouth daily.  01/31/19   [provider]  cloNIDine (CATAPRES) 0.1 MG tablet Take 0.1 mg by mouth daily.  04/20/18   [provider]  ergocalciferol (VITAMIN D2) 1.25 MG (50000 UT) capsule Take 50,000 Units by mouth once a week.  08/02/18   [provider]  ferric citrate (AURYXIA) 1 GM 210 MG(Fe) tablet Take 210 mg by mouth 3 (three) times daily with meals.     [provider]  lidocaine (LIDODERM) 5 % Place 1 patch onto the skin daily. Remove & Discard patch within 12 hours or as directed by MD Patient not taking: Reported on 08/06/2019 05/28/18   Nuala Alpha A, PA-C  nebivolol (BYSTOLIC) 10 MG tablet Take 10 mg by mouth at bedtime.     [provider]  oxyCODONE-acetaminophen (PERCOCET) 5-325 MG tablet Take 1 tablet by mouth every 4 (four) hours as needed for severe pain. Patient not taking: Reported on 09/16/2019 08/15/19 08/14/20   Serafina Mitchell, MD  sodium bicarbonate 650 MG tablet Take 650 mg by mouth 2 (two) times daily.     [provider]  tamsulosin (FLOMAX) 0.4 MG CAPS capsule Take 0.4 mg by mouth at bedtime.     [provider]    Allergies    Gabapentin and Sulfa antibiotics  Review of Systems   Review of Systems  All other systems reviewed and are negative.   Physical Exam Updated Vital Signs BP (!) 193/87   Pulse (!) 58   Temp 97.7 F (36.5 C) (Oral)   Resp 13   Ht 5\' 11"  (1.803 m)   Wt 95.3 kg   SpO2 100%   BMI 29.30 kg/m   Physical Exam Vitals and nursing note reviewed.  Constitutional:      Appearance: He is well-developed.  HENT:     Head: Normocephalic and atraumatic.     Mouth/Throat:     Mouth: Mucous membranes are moist.     Pharynx: Oropharynx is clear.  Eyes:     Pupils: Pupils are equal, round, and reactive to light.  Cardiovascular:     Rate and Rhythm: Normal rate.  Pulmonary:     Effort: Pulmonary effort is normal. No respiratory distress.  Abdominal:     General: There is no distension.  Musculoskeletal:        General: Normal range of motion.     Cervical back: Normal range of motion.     Right lower leg: Edema present.     Left lower leg: Edema present.  Skin:    General: Skin is warm and dry.  Neurological:     General: No focal deficit present.     Mental Status: He is alert.     ED Results / Procedures / Treatments   Labs (all labs ordered are listed, but only abnormal results are displayed) Labs Reviewed  CBC WITH DIFFERENTIAL/PLATELET - Abnormal; Notable for the following components:      Result Value   RBC 2.71 (*)    Hemoglobin 7.5 (*)    HCT 25.0 (*)    All other components within normal limits  COMPREHENSIVE METABOLIC PANEL - Abnormal; Notable for the following components:   Potassium 6.0 (*)    Chloride 112 (*)    CO2 16 (*)    Glucose, Bld 126 (*)    BUN 91 (*)    Creatinine, Ser 7.04 (*)    Calcium 8.1 (*)     Total Protein 5.9 (*)    Albumin 3.0 (*)    AST 61 (*)    ALT 88 (*)    GFR calc non Af Amer 7 (*)    GFR calc Af Amer 8 (*)    All other components within normal limits  BASIC METABOLIC PANEL - Abnormal; Notable for the following components:   Potassium 6.0 (*)    Chloride 113 (*)    CO2 17 (*)    BUN 89 (*)    Creatinine, Ser 6.95 (*)    Calcium 8.5 (*)    GFR calc non Af Amer 7 (*)    GFR calc Af Amer 8 (*)    All other components within normal limits  CBC - Abnormal; Notable for the following components:   RBC 3.02 (*)    Hemoglobin 8.2 (*)    HCT 27.7 (*)    MCHC 29.6 (*)    All other components within normal limits  I-STAT CHEM 8, ED - Abnormal; Notable for the following components:   Potassium 5.9 (*)    Chloride 116 (*)    BUN 92 (*)    Creatinine, Ser 7.40 (*)    Glucose, Bld 115 (*)    Calcium, Ion 1.11 (*)    TCO2 15 (*)    Hemoglobin 7.8 (*)    HCT 23.0 (*)    All other components within normal limits  SARS CORONAVIRUS 2 BY RT PCR (HOSPITAL ORDER, [facility])  HEPARIN LEVEL (UNFRACTIONATED)  APTT    EKG None  Radiology VAS US DUPLEX DIALYSIS ACCESS (AVF, AVG)  Result Date: 09/20/2019 DIALYSIS ACCESS Reason for Exam: Swelling, warmth of left upper extremity. Access Site: Left Upper Extremity. Access Type: Upper arm loop AVG. History: LUE  A/V shuntogram 09/16/2019. Performing Technologist: Maudry Mayhew MHA, RDMS, RVT, RDCS  Examination Guidelines: A complete evaluation includes B-mode imaging, spectral Doppler, color Doppler, and power Doppler as needed of all accessible portions of each vessel. Unilateral testing is considered an integral part of a complete examination. Limited examinations for reoccurring indications may be performed as noted.  Findings:   +--------------------+----------+-----------------+--------+ AVG                 PSV (cm/s)Flow Vol (mL/min)Describe  +--------------------+----------+-----------------+--------+ Native artery inflow   193                              +--------------------+----------+-----------------+--------+ Arterial anastomosis   229                              +--------------------+----------+-----------------+--------+ Prox graft             395                              +--------------------+----------+-----------------+--------+ Mid graft              275                              +--------------------+----------+-----------------+--------+ Distal graft           344                              +--------------------+----------+-----------------+--------+ Venous anastomosis     280                              +--------------------+----------+-----------------+--------+ Venous outflow         287                              +--------------------+----------+-----------------+--------+  Summary: The arterial anastomosis exhibits visual narrowing by color Doppler without significantly elevated velocities; cannot exclude stenosis. Some perigraft flow is visualized; cannot exclude infection. *See table(s) above for measurements and observations.  Diagnosing physician: Servando Snare MD Electronically signed by Servando Snare MD on 09/20/2019 at 3:39:51 PM.   --------------------------------------------------------------------------------   Final    UE VENOUS DUPLEX ([facility] & [facility] 7 am - 7 pm)  Result Date: 09/20/2019 UPPER VENOUS STUDY  Indications: Swelling, and Pain Limitations: LUE AVG, poor ultrasound/tissue interface and body habitus. Comparison Study: No prior study Performing Technologist: Maudry Mayhew MHA, RDMS, RVT, RDCS  Examination Guidelines: A complete evaluation includes B-mode imaging, spectral Doppler, color Doppler, and power Doppler as needed of all accessible portions of each vessel. Bilateral testing is considered an integral part of a complete examination. Limited examinations for  reoccurring indications may be performed as noted.  Left Findings: +----------+------------+---------+-----------+----------+--------------+ LEFT      CompressiblePhasicitySpontaneousProperties   Summary     +----------+------------+---------+-----------+----------+--------------+ IJV           Full       Yes       Yes                             +----------+------------+---------+-----------+----------+--------------+ Subclavian    Full  Yes       Yes                             +----------+------------+---------+-----------+----------+--------------+ Axillary      Full       Yes       Yes                             +----------+------------+---------+-----------+----------+--------------+ Brachial      Full       Yes       Yes                             +----------+------------+---------+-----------+----------+--------------+ Radial        Full                                                 +----------+------------+---------+-----------+----------+--------------+ Ulnar         Full                                                 +----------+------------+---------+-----------+----------+--------------+ Cephalic      Full                                                 +----------+------------+---------+-----------+----------+--------------+ Basilic                                             Not visualized +----------+------------+---------+-----------+----------+--------------+  Summary:  Left: No evidence of deep vein thrombosis in the upper extremity. No evidence of superficial vein thrombosis involving the visualized veins of the upper extremity.  *See table(s) above for measurements and observations.  Diagnosing physician: Servando Snare MD Electronically signed by Servando Snare MD on 09/20/2019 at 3:39:58 PM.    Final     Procedures Procedures (including critical care time)  Medications Ordered in ED Medications  nebivolol (BYSTOLIC)  tablet 10 mg (has no administration in time range)  ondansetron (ZOFRAN) tablet 4 mg (has no administration in time range)    Or  ondansetron (ZOFRAN) injection 4 mg (has no administration in time range)  acetaminophen (TYLENOL) tablet 650 mg (650 mg Oral Given 09/21/19 0222)    Or  acetaminophen (TYLENOL) suppository 650 mg ( Rectal See Alternative 09/21/19 0222)  heparin ADULT infusion 100 units/mL (25000 units/245mL sodium chloride 0.45%) (1,300 Units/hr Intravenous New Bag/Given 09/21/19 0318)  sodium zirconium cyclosilicate (LOKELMA) packet 10 g (10 g Oral Given 09/21/19 0222)  cloNIDine (CATAPRES) tablet 0.1 mg (0.1 mg Oral Given 09/21/19 0222)  heparin bolus via infusion 2,000 Units (2,000 Units Intravenous Bolus from Bag 09/21/19 0316)    ED Course  I have reviewed the triage vital signs and the nursing notes.  Pertinent labs & imaging results that were available during my care of the patient were reviewed by me and considered in my medical  decision making (see chart for details).    MDM Rules/Calculators/A&P                          Patient with mild hyperkalemia so given some Lokelma.  No evidence of T wave changes so no calcium given.  I discussed his situation with the nephrologist who states that he does need dialysis and probably should get it this weekend and there is no way they can schedule as an outpatient so discussed it with the hospitalist who will observe to initiate dialysis.  Final Clinical Impression(s) / ED Diagnoses Final diagnoses:  Acute renal failure, unspecified acute renal failure type [facility])    Rx / DC Orders ED Discharge Orders    None       Natalina Wieting, Corene Cornea, MD 09/21/19 0522

## 2019-09-21 NOTE — ED Notes (Signed)
ED Provider at bedside. 

## 2019-09-21 NOTE — H&P (Signed)
History and Physical    Duane Ortiz UKG:[id number] DOB: [birth year] DOA: 09/20/2019  PCP: Sandi Mariscal, MD  Patient coming from: Home  I have personally briefly reviewed patient's old medical records in [place]  Chief Complaint: Needs dialysis  HPI: Duane Ortiz is a 69 y.o. 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  Pt with recent progression to ESRD.  AV graft placement 8/12.  Had swelling of LUE so got fistulagram 9/13 which showed patent graft, 60% stenosis which Dr. Donzetta Matters stented.  Pt sent in from nephrologists office today as he now needs to start HD with K of 6.0.  Pt's only complaint is ongoing swelling to the LUE.  Denies fevers, chills.   ED Course: WBC nl, no SIRS.  [place] LUE DVT is neg  [place] LUE dialysis access: The arterial anastomosis exhibits visual narrowing by color Doppler without significantly elevated velocities; cannot exclude stenosis. Some perigraft flow is visualized; cannot exclude infection.   Review of Systems: As per HPI, otherwise all review of systems negative.  Past Medical History:  Diagnosis Date  . Arthritis   . Chronic kidney disease   . Chronic kidney disease   . Diabetes ([place]) 02/012017  . Fainting   . Gait abnormality 10/11/2016  . GERD (gastroesophageal reflux disease)   . Gout   . Headache   . Hypercholesteremia   . Hypercholesterolemia   . Hypertension   . Peripheral neuropathy 04/02/2019  . Pulmonary embolism ([place])   . Shortness of breath   . Sleep apnea    does wear cpap  . Wears dentures     Past Surgical History:  Procedure Laterality Date  . A/V SHUNTOGRAM N/A 09/16/2019   Procedure: A/V SHUNTOGRAM - Left Arm;  Surgeon: Waynetta Sandy, MD;  Location: [facility];  Service: Cardiovascular;  Laterality: N/A;  . AV  FISTULA PLACEMENT Left 08/15/2019   Procedure: LEFT ARM ARTERIOVENOUS GORE-TEX GRAFT;  Surgeon: Serafina Mitchell, MD;  Location: [facility];  Service: Vascular;  Laterality: Left;  . CERVICAL DISCECTOMY    . COLONOSCOPY    . MULTIPLE TOOTH EXTRACTIONS    . neck spine disk surgery  01/03/1993  . PERIPHERAL VASCULAR INTERVENTION Left 09/16/2019   Procedure: PERIPHERAL VASCULAR INTERVENTION;  Surgeon: Waynetta Sandy, MD;  Location: [facility];  Service: Cardiovascular;  Laterality: Left;  ARM FISTULS  . WRIST SURGERY       reports that he has been smoking cigarettes. He has been smoking about 0.25 packs per day. He has never used smokeless tobacco. He reports previous alcohol use. He reports that he does not use drugs.  Allergies  Allergen Reactions  . Gabapentin     Felt like I was out of my head, slept all day, confusion   . Sulfa Antibiotics Itching    Family History  Problem Relation Age of Onset  . Diabetes Mother   . Heart disease Mother   . Hypertension Mother   . Diabetes Sister   . Hypertension Sister   . Diabetes Brother   . Hypertension Brother   . Breast cancer Paternal Grandmother      Prior to Admission medications   Medication Sig Start Date End Date Taking? Authorizing Provider  allopurinol (ZYLOPRIM) 100 MG tablet Take 100 mg by mouth daily.    [provider]  ALPRAZolam (XANAX) 0.5 MG tablet Take 2 tablets approximately 45 minutes prior to the MRI study, take a third tablet if needed. Patient not taking: Reported on 08/06/2019 01/15/19   Kathrynn Ducking, MD  baclofen (LIORESAL) 10 MG tablet Take 10 mg by mouth 2 (two) times daily as needed for muscle spasms.  04/27/18   [provider]  calcitRIOL (ROCALTROL) 0.25 MCG capsule Take 0.25 mcg by mouth daily.  01/31/19   [provider]  cloNIDine (CATAPRES) 0.1 MG tablet Take 0.1 mg by mouth daily.  04/20/18   [provider]  ergocalciferol (VITAMIN D2) 1.25 MG (50000  UT) capsule Take 50,000 Units by mouth once a week.  08/02/18   [provider]  ferric citrate (AURYXIA) 1 GM 210 MG(Fe) tablet Take 210 mg by mouth 3 (three) times daily with meals.     [provider]  lidocaine (LIDODERM) 5 % Place 1 patch onto the skin daily. Remove & Discard patch within 12 hours or as directed by MD Patient not taking: Reported on 08/06/2019 05/28/18   Nuala Alpha A, PA-C  nebivolol (BYSTOLIC) 10 MG tablet Take 10 mg by mouth at bedtime.     [provider]  oxyCODONE-acetaminophen (PERCOCET) 5-325 MG tablet Take 1 tablet by mouth every 4 (four) hours as needed for severe pain. Patient not taking: Reported on 09/16/2019 08/15/19 08/14/20  Serafina Mitchell, MD  sodium bicarbonate 650 MG tablet Take 650 mg by mouth 2 (two) times daily.     [provider]  tamsulosin (FLOMAX) 0.4 MG CAPS capsule Take 0.4 mg by mouth at bedtime.     [provider]    Physical Exam: Vitals:   09/20/19 1900 09/20/19 2000 09/20/19 2229 09/21/19 0015  BP: (!) 177/92 (!) 186/88 (!) 163/97 (!) 181/105  Pulse: (!) 57 87 66   Resp: 15 16 18 20   Temp: 97.7 F (36.5 C)     TempSrc: Oral     SpO2: 99% 99% 100%     Constitutional: NAD, calm, comfortable Eyes: PERRL, lids and conjunctivae normal ENMT: Mucous membranes are moist. Posterior pharynx clear of any exudate or lesions.Normal dentition.  Neck: normal, supple, no masses, no thyromegaly Respiratory: clear to auscultation bilaterally, no wheezing, no crackles. Normal respiratory effort. No accessory muscle use.  Cardiovascular: Regular rate and rhythm, no murmurs / rubs / gallops. No extremity edema. 2+ pedal pulses. No carotid bruits.  Abdomen: no tenderness, no masses palpated. No hepatosplenomegaly. Bowel sounds positive.  Musculoskeletal: no clubbing / cyanosis. No joint deformity upper and lower extremities. Good ROM, no contractures. Normal muscle tone.  Skin: no rashes, lesions, ulcers.  No induration Neurologic: CN 2-12 grossly intact. Sensation intact, DTR normal. Strength 5/5 in all 4.  Psychiatric: Normal judgment and insight. Alert and oriented x 3. Normal mood.    Labs on Admission: I have personally reviewed following labs and imaging studies  CBC: Recent Labs  Lab 09/16/19 0636 09/20/19 1353 09/20/19 1400  WBC  --  5.6  --   NEUTROABS  --  3.5  --   HGB 8.2* 7.5* 7.8*  HCT 24.0* 25.0* 23.0*  MCV  --  92.3  --   PLT  --  176  --    Basic Metabolic Panel: Recent Labs  Lab 09/16/19 0636 09/20/19 1353 09/20/19 1400  NA 142 138 141  K 5.1 6.0* 5.9*  CL 114* 112* 116*  CO2  --  16*  --  GLUCOSE 82 126* 115*  BUN 85* 91* 92*  CREATININE 7.40* 7.04* 7.40*  CALCIUM  --  8.1*  --    GFR: Estimated Creatinine Clearance: 10.8 mL/min (A) (by C-G formula based on SCr of 7.4 mg/dL (H)). Liver Function Tests: Recent Labs  Lab 09/20/19 1353  AST 61*  ALT 88*  ALKPHOS 63  BILITOT 0.3  PROT 5.9*  ALBUMIN 3.0*   No results for input(s): LIPASE, AMYLASE in the last 168 hours. No results for input(s): AMMONIA in the last 168 hours. Coagulation Profile: No results for input(s): INR, PROTIME in the last 168 hours. Cardiac Enzymes: No results for input(s): CKTOTAL, CKMB, CKMBINDEX, TROPONINI in the last 168 hours. BNP (last 3 results) No results for input(s): PROBNP in the last 8760 hours. HbA1C: No results for input(s): HGBA1C in the last 72 hours. CBG: No results for input(s): GLUCAP in the last 168 hours. Lipid Profile: No results for input(s): CHOL, HDL, LDLCALC, TRIG, CHOLHDL, LDLDIRECT in the last 72 hours. Thyroid Function Tests: No results for input(s): TSH, T4TOTAL, FREET4, T3FREE, THYROIDAB in the last 72 hours. Anemia Panel: No results for input(s): VITAMINB12, FOLATE, FERRITIN, TIBC, IRON, RETICCTPCT in the last 72 hours. Urine analysis:    Component Value Date/Time   COLORURINE STRAW (A) 05/28/2018 1010   APPEARANCEUR CLEAR  05/28/2018 1010   LABSPEC 1.010 05/28/2018 1010   PHURINE 6.0 05/28/2018 1010   GLUCOSEU NEGATIVE 05/28/2018 1010   HGBUR NEGATIVE 05/28/2018 1010   BILIRUBINUR NEGATIVE 05/28/2018 1010   KETONESUR NEGATIVE 05/28/2018 1010   PROTEINUR 100 (A) 05/28/2018 1010   NITRITE NEGATIVE 05/28/2018 1010   LEUKOCYTESUR NEGATIVE 05/28/2018 1010    Radiological Exams on Admission: VAS [place] (AVF, AVG)  Result Date: 09/20/2019 DIALYSIS ACCESS Reason for Exam: Swelling, warmth of left upper extremity. Access Site: Left Upper Extremity. Access Type: Upper arm loop AVG. History: LUE A/V shuntogram 09/16/2019. Performing Technologist: Maudry Mayhew MHA, RDMS, RVT, RDCS  Examination Guidelines: A complete evaluation includes B-mode imaging, spectral Doppler, color Doppler, and power Doppler as needed of all accessible portions of each vessel. Unilateral testing is considered an integral part of a complete examination. Limited examinations for reoccurring indications may be performed as noted.  Findings:   +--------------------+----------+-----------------+--------+ AVG                 PSV (cm/s)Flow Vol (mL/min)Describe +--------------------+----------+-----------------+--------+ Native artery inflow   193                              +--------------------+----------+-----------------+--------+ Arterial anastomosis   229                              +--------------------+----------+-----------------+--------+ Prox graft             395                              +--------------------+----------+-----------------+--------+ Mid graft              275                              +--------------------+----------+-----------------+--------+ Distal graft           344                              +--------------------+----------+-----------------+--------+  Venous anastomosis     280                               +--------------------+----------+-----------------+--------+ Venous outflow         287                              +--------------------+----------+-----------------+--------+  Summary: The arterial anastomosis exhibits visual narrowing by color Doppler without significantly elevated velocities; cannot exclude stenosis. Some perigraft flow is visualized; cannot exclude infection. *See table(s) above for measurements and observations.  Diagnosing physician: Servando Snare MD Electronically signed by Servando Snare MD on 09/20/2019 at 3:39:51 PM.   --------------------------------------------------------------------------------   Final    UE VENOUS DUPLEX ([place] [facility] 7 am - 7 pm)  Result Date: 09/20/2019 UPPER VENOUS STUDY  Indications: Swelling, and Pain Limitations: LUE AVG, poor ultrasound/tissue interface and body habitus. Comparison Study: No prior study Performing Technologist: Maudry Mayhew MHA, RDMS, RVT, RDCS  Examination Guidelines: A complete evaluation includes B-mode imaging, spectral Doppler, color Doppler, and power Doppler as needed of all accessible portions of each vessel. Bilateral testing is considered an integral part of a complete examination. Limited examinations for reoccurring indications may be performed as noted.  Left Findings: +----------+------------+---------+-----------+----------+--------------+ LEFT      CompressiblePhasicitySpontaneousProperties   Summary     +----------+------------+---------+-----------+----------+--------------+ IJV           Full       Yes       Yes                             +----------+------------+---------+-----------+----------+--------------+ Subclavian    Full       Yes       Yes                             +----------+------------+---------+-----------+----------+--------------+ Axillary      Full       Yes       Yes                             +----------+------------+---------+-----------+----------+--------------+  Brachial      Full       Yes       Yes                             +----------+------------+---------+-----------+----------+--------------+ Radial        Full                                                 +----------+------------+---------+-----------+----------+--------------+ Ulnar         Full                                                 +----------+------------+---------+-----------+----------+--------------+ Cephalic      Full                                                 +----------+------------+---------+-----------+----------+--------------+  Basilic                                             Not visualized +----------+------------+---------+-----------+----------+--------------+  Summary:  Left: No evidence of deep vein thrombosis in the upper extremity. No evidence of superficial vein thrombosis involving the visualized veins of the upper extremity.  *See table(s) above for measurements and observations.  Diagnosing physician: Servando Snare MD Electronically signed by Servando Snare MD on 09/20/2019 at 3:39:58 PM.    Final     EKG: Independently reviewed.  Assessment/Plan Principal Problem:   ESRD (end stage renal disease) ([place]) Active Problems:   Essential hypertension   Type 2 diabetes mellitus with renal complication (HCC)   Coagulation defect (HCC)   Hyperkalemia, diminished renal excretion    1. ESRD - now with mild hyperkalemia 1. Getting lokelma for tonight 2. Needs to start dialysis in AM 3. EDP spoke with nephrology 4. They asked for med admit and they will start dialysis in AM 5. Regarding access - 1. Has LUE graft with at least some flow, just got stent a couple of days ago 2. Does have LUE edema 3. I dont THINK graft is infected at this point, couldn't rule out with [place] but no WBC no SIRS at all, no definite findings of infection and just edema despite having graft and some swelling for a month now (usually id expect patient with  infected graft to be much much sicker by this point). 4. Will hold off on ABx for the moment. 2. Chronic anticoagulation - 1. For h/o PE and coagulation disorder 2. Still taking Xarelto chronically for this 3. Presumably this needs to change (probably to eliquis ultimately would be my guess) now that he is going on dialysis for ESRD 4. Hold Xarelto 5. Use heparin gtt instead for the moment, short acting just-in-case they have access problems tomorrow AM, and need to do some procedure to get other access for dialysis. 3. HTN - 1. Med rec pending, cont all home meds 2. Got clonidine and bystolic evening doses in ED as BP is 517 systolic 4. DM2 - 1. Diet controlled it appears  DVT prophylaxis: Heparin gtt Code Status: Full Family Communication: No family in room Disposition Plan: Home after nephrology does dialysis and clears pt Consults called: EDP spoke with nephro Admission status: Place in obs    Shahiem Bedwell, [place] [facility]  How to contact the [facility] Attending or Consulting provider [place] or covering provider during after hours [place], for this patient?  1. Check the care team in [facility] and look for a) attending/consulting [facility] provider listed and b) the [facility] team listed 2. Log into [email or web address]  Amion Physician Scheduling and messaging for groups and whole hospitals  On call and physician scheduling software for group practices, residents, hospitalists and other medical providers for call, clinic, rotation and shift schedules. On[employer] is a hospital-wide system for scheduling doctors and paging doctors on call. EasyPlot is for scientific plotting and data analysis.  [email or web address]  and use [facility]'s universal password to access. If you do not have the password, please contact the hospital operator.  3. Locate the [facility] provider you are looking for under [facility] and page to a number that you can be directly reached. 4. If you still have difficulty  reaching the provider, please page the [facility] (Director  on Call) for the Hospitalists listed on amion for assistance.  09/21/2019, 1:42 AM

## 2019-09-21 NOTE — Consult Note (Addendum)
ESRD Consult Note  Requesting provider: Nicolette Bang Service requesting consult: Hospitalist Reason for consult: ESRD, provision of dialysis Indication for acute dialysis?: End Stage Renal Disease  Outpatient dialysis unit: unknown? [facility]? Outpatient dialysis schedule: TBD  Assessment/Recommendations: Duane Ortiz is a/an 69 y.o. male with a past medical history notable for ESRD on HD admitted with ESRD and need to start HD.   # ESRD: new start dialysis. Will perform session today and likely Monday. Will involve social work on Monday to help set up outpt dialysis. Already has a unit but need to ensure he has ability to transport there. # Volume/ hypertension: Volume overloaded on exam with edema.  Also high blood pressure.  Will attempt to remove ultrafiltration with dialysis.  Can continue home Bystolic and clonidine.  Consider addition of hydralazine if needed. # Anemia of Chronic Kidney Disease: Hemoglobin 8.2.  Obtain iron studies and consider Aranesp if needed # Secondary Hyperparathyroidism/Hyperphosphatemia: Obtain PTH and phosphorus. Restart auryxia and calcitriol if indicated. # Vascular access: AVG placed on 8/12 status post manipulation on 9/13.  Good bruit.  Should be able to use today #Hyperkalemia: Status post Lokelma 10 g.  Dialysis today #NAGMA; secondary to renal failure.  Dialysis as above #PE: history of PE on xarelto, Stopped by admitting team and on heparin. Needs to transition to alternate agent likely eliquis  # Additional recommendations: - Dose all meds for creatinine clearance < 10 ml/min  - Unless absolutely necessary, no MRIs with gadolinium.  - Implement save arm precautions.  Prefer needle sticks in the dorsum of the hands or wrists.  No blood pressure measurements in arm. - If blood transfusion is requested during hemodialysis sessions, please alert [place] prior to the session.   Recommendations were discussed with the primary team.   History of  Present Illness: Duane Ortiz is a/an 69 y.o. male with a past medical history of ESRD who presents with inability to get outpatient dialysis and symptoms of uremia.  Patient story is somewhat confusing and convoluted.  Firstly, the patient's son called [facility] yesterday stating that the patient was supposed to be started on dialysis outpatient but is not able to get to his dialysis unit.  The patient states that he follows with a nephrologist in [facility] that he knows as "Dr. Loni Muse."  He does not know the name otherwise.  He states that he has been getting labs intermittently and was told that his kidney function was significantly decreased and that he needed to start dialysis.  The son stated that the patient did not have transport to dialysis but the patient states he did not know if he was ready to start dialysis.  The patient has some nausea but denies other significant symptoms.  He has noticed left upper extremity swelling.  He had a graft placed on 8/12 and then manipulation on 9/13 with stent placement with [facility].  [facility] states that the graft is ready to be used.  The patient ultimately came to the emergency department for need to start dialysis with inability to undergo dialysis outpatient due to social barriers.  On arrival the patient's blood pressure was found to be elevated up to one hundred ninety systolic.  Bicarb was found to be seventeen with a creatinine of seven and potassium was six.  The patient was given 10 g of Lokelma.  Medications:  Current Facility-Administered Medications  Medication Dose Route Frequency Provider Last Rate Last Admin  . acetaminophen (TYLENOL) tablet 650 mg  650  mg Oral Q6H PRN Etta Quill, DO   650 mg at 09/21/19 0222   Or  . acetaminophen (TYLENOL) suppository 650 mg  650 mg Rectal Q6H PRN Etta Quill, DO      . Chlorhexidine Gluconate Cloth 2 % PADS 6 each  6 each Topical Q0600 Reesa Chew, MD      . cloNIDine (CATAPRES)  tablet 0.1 mg  0.1 mg Oral Daily Jennette Kettle M, DO   0.1 mg at 09/21/19 0934  . heparin ADULT infusion 100 units/mL (25000 units/21mL sodium chloride 0.45%)  1,300 Units/hr Intravenous Continuous Laren Everts, RPH 13 mL/hr at 09/21/19 0318 1,300 Units/hr at 09/21/19 0318  . nebivolol (BYSTOLIC) tablet 10 mg  10 mg Oral QHS Etta Quill, DO      . ondansetron [facility]) tablet 4 mg  4 mg Oral Q6H PRN Etta Quill, DO       Or  . ondansetron [facility]) injection 4 mg  4 mg Intravenous Q6H PRN Etta Quill, DO      . tamsulosin (FLOMAX) capsule 0.4 mg  0.4 mg Oral QHS Etta Quill, DO       Current Outpatient Medications  Medication Sig Dispense Refill  . allopurinol (ZYLOPRIM) 100 MG tablet Take 100 mg by mouth daily.    . baclofen (LIORESAL) 10 MG tablet Take 10 mg by mouth daily as needed for muscle spasms.     . calcitRIOL (ROCALTROL) 0.25 MCG capsule Take 0.25 mcg by mouth daily.     . cloNIDine (CATAPRES) 0.1 MG tablet Take 0.1 mg by mouth daily.     . ergocalciferol (VITAMIN D2) 1.25 MG (50000 UT) capsule Take 50,000 Units by mouth once a week.     . ferric citrate (AURYXIA) 1 GM 210 MG(Fe) tablet Take 210 mg by mouth 3 (three) times daily with meals.     [name] oxyCODONE-acetaminophen (PERCOCET) 5-325 MG tablet Take 1 tablet by mouth every 4 (four) hours as needed for severe pain. 20 tablet 0  . sodium bicarbonate 650 MG tablet Take 650 mg by mouth 2 (two) times daily.     . tamsulosin (FLOMAX) 0.4 MG CAPS capsule Take 0.4 mg by mouth at bedtime.     [name] VITAMIN E PO Take 1 capsule by mouth daily.    [name] ALPRAZolam (XANAX) 0.5 MG tablet Take 2 tablets approximately 45 minutes prior to the MRI study, take a third tablet if needed. (Patient not taking: Reported on 08/06/2019) 3 tablet 0  . lidocaine (LIDODERM) 5 % Place 1 patch onto the skin daily. Remove & Discard patch within 12 hours or as directed by MD (Patient not taking: Reported on 08/06/2019) 30 patch 0  . nebivolol (BYSTOLIC)  10 MG tablet Take 10 mg by mouth at bedtime.        ALLERGIES Gabapentin and Sulfa antibiotics  MEDICAL HISTORY Past Medical History:  Diagnosis Date  . Arthritis   . Chronic kidney disease   . Chronic kidney disease   . Diabetes ([place]) 02/012017  . Fainting   . Gait abnormality 10/11/2016  . GERD (gastroesophageal reflux disease)   . Gout   . Headache   . Hypercholesteremia   . Hypercholesterolemia   . Hypertension   . Peripheral neuropathy 04/02/2019  . Pulmonary embolism ([place])   . Shortness of breath   . Sleep apnea    does wear cpap  . Wears dentures      SOCIAL HISTORY Social History  Socioeconomic History  . Marital status: Divorced    Spouse name: Not on file  . Number of children: 3  . Years of education: 10  . Highest education level: Not on file  Occupational History  . Not on file  Tobacco Use  . Smoking status: Current Some Day Smoker    Packs/day: 0.25    Types: Cigarettes  . Smokeless tobacco: Never Used  Vaping Use  . Vaping Use: Never used  Substance and Sexual Activity  . Alcohol use: Not Currently    Alcohol/week: 0.0 standard drinks  . Drug use: No  . Sexual activity: Not on file  Other Topics Concern  . Not on file  Social History Narrative   Lives alone   Caffeine use: Sometimes   Right handed    Social Determinants of Health   Financial Resource Strain:   . Difficulty of Paying Living Expenses: Not on file  Food Insecurity:   . Worried About Charity fundraiser in the Last Year: Not on file  . Ran Out of Food in the Last Year: Not on file  Transportation Needs:   . Lack of Transportation (Medical): Not on file  . Lack of Transportation (Non-Medical): Not on file  Physical Activity:   . Days of Exercise per Week: Not on file  . Minutes of Exercise per Session: Not on file  Stress:   . Feeling of Stress : Not on file  Social Connections:   . Frequency of Communication with Friends and Family: Not on file  . Frequency of  Social Gatherings with Friends and Family: Not on file  . Attends Religious Services: Not on file  . Active Member of Clubs or Organizations: Not on file  . Attends Archivist Meetings: Not on file  . Marital Status: Not on file  Intimate Partner Violence:   . Fear of Current or Ex-Partner: Not on file  . Emotionally Abused: Not on file  . Physically Abused: Not on file  . Sexually Abused: Not on file     FAMILY HISTORY Family History  Problem Relation Age of Onset  . Diabetes Mother   . Heart disease Mother   . Hypertension Mother   . Diabetes Sister   . Hypertension Sister   . Diabetes Brother   . Hypertension Brother   . Breast cancer Paternal Grandmother      Review of Systems: 12 systems were reviewed and negative except per HPI  Physical Exam: Vitals:   09/21/19 0845 09/21/19 0915  BP: (!) 187/71 (!) 195/79  Pulse: 62 64  Resp: 14 14  Temp:    SpO2: 99% 100%   No intake/output data recorded. No intake or output data in the 24 hours ending 09/21/19 0942 General: Chronically ill-appearing, no distress HEENT: anicteric sclera, MMM CV: normal rate, no murmurs, 1+ pitting edema in all four extremities Lungs: bilateral chest rise, normal wob Abd: soft, non-tender, non-distended Skin: no visible lesions or rashes Psych: alert, engaged, appropriate mood and affect Neuro: normal speech, no gross focal deficits  Access: Left upper extremity AVG with good bruit and thrill  Test Results Reviewed Lab Results  Component Value Date   NA 140 09/21/2019   K 6.0 (H) 09/21/2019   CL 113 (H) 09/21/2019   CO2 17 (L) 09/21/2019   BUN 89 (H) 09/21/2019   CREATININE 6.95 (H) 09/21/2019   CALCIUM 8.5 (L) 09/21/2019   ALBUMIN 3.0 (L) 09/20/2019    I have reviewed relevant outside  healthcare records

## 2019-09-21 NOTE — ED Notes (Signed)
Tele  Breakfast Ordered 

## 2019-09-21 NOTE — ED Notes (Signed)
Pt states they usually have difficulty with IV start. Attempted x2, both with IV infiltration. IV team consult\ed

## 2019-09-21 NOTE — ED Notes (Signed)
Assuming care of patient at this time.

## 2019-09-21 NOTE — Progress Notes (Signed)
PROGRESS NOTE    Duane Ortiz  OAC:[id number] DOB: [birth year] DOA: 09/20/2019 PCP: Sandi Mariscal, MD   Brief Narrative:  : Duane Ortiz is a 69 y.o. 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  Pt with recent progression to ESRD.  AV graft placement 8/12.  Had swelling of LUE so got fistulagram 9/13 which showed patent graft, 60% stenosis which Dr. Donzetta Matters stented.  Pt sent in from nephrologists office today as he now needs to start HD with K of 6.0.  Pt's only complaint is ongoing swelling to the LUE.  Denies fevers, chills.   Assessment & Plan:   Principal Problem:   ESRD (end stage renal disease) ([place]) Active Problems:   Essential hypertension   Type 2 diabetes mellitus with renal complication (HCC)   Coagulation defect (HCC)   Hyperkalemia, diminished renal excretion   1. ESRD - now with mild hyperkalemia 1. Getting lokelma for tonight 2. Needs to start dialysis in AM 3. EDP spoke with nephrology 4. They asked for med admit and they will start dialysis in AM 5. Regarding access - 1. Has LUE graft with at least some flow, just got stent a couple of days ago 2. Does have LUE edema 3. I dont THINK graft is infected at this point, couldn't rule out with [place] but no WBC no SIRS at all, no definite findings of infection and just edema despite having graft and some swelling for a month now (usually id expect patient with infected graft to be much much sicker by this point). 4. Will hold off on ABx for the moment. 2. Chronic anticoagulation - 1. For h/o PE and coagulation disorder 2. Still taking Xarelto chronically for this 3. Presumably this needs to change (probably to eliquis ultimately would be my guess) now that he is going on dialysis for ESRD 4. Hold Xarelto 5. Use heparin gtt instead for the  moment, short acting just-in-case they have access problems tomorrow AM, and need to do some procedure to get other access for dialysis. 3. HTN - 1. Med rec pending, cont all home meds 2. Got clonidine and bystolic evening doses in ED as BP is 010 systolic 4. DM2 - 1. Diet controlled it appears  DVT prophylaxis: On:hep gtt  Code Status: full    Code Status Orders  (From admission, onward)         Start     Ordered   09/21/19 0134  Full code  Continuous        09/21/19 0134        Code Status History    This patient has a current code status but no historical code status.   Advance Care Planning Activity     Family Communication: [phone] discussed with son Disposition Plan:  Will remian in obs, discussed with UR, pt will likley remian in hospital till Monday or Tuesday fro initiation of new HD and to establish HD at OSH  Consults called: [place] Admission status: Observation   Consultants:   AS ABOVE  Procedures:  PERIPHERAL VASCULAR CATHETERIZATION  Result Date: 09/16/2019 Patient name: Duane Ortiz MRN: [id number] DOB: 04/03/1950 Sex: male 09/16/2019 Pre-operative Diagnosis: esrd, swelling left upper extremity Post-operative diagnosis:  Same Surgeon:  Eda Paschal. Donzetta Matters, MD Procedure Performed: 1.  Ultrasound-guided cannulation left arm AV graft 2.  With the upper extremity and  central fistulogram 3.  Stent of graft venous anastomosis with 7 x 50 mm Viabahn Indications: 69 year old male with chronic kidney disease now considered in need of dialysis has swelling of his left upper extremity following left arm AV graft placement.  He is now indicated for fistulogram and possible intervention. Findings: The graft itself is patent.  There is an end to end anastomosis to the axillary vein.  There was approximately 60% stenosis at the outflow.  Central veins are patent.  After stent placement there is less than 20% residual stenosis.  Stent was balloon dilated to 7 mm and retrograde  imaging demonstrated patent arterial anastomosis.  Procedure:  The patient was identified in the holding area and taken to room 8.  The patient was then placed supine on the table and prepped and draped in the usual sterile fashion.  A time out was called.  Ultrasound was used to evaluate the left arm AV graft.  The area was anesthetized 1% lidocaine cannulated with micropuncture needle followed the wire sheath.  This was done with ultrasound guidance and an image was saved the permanent record.  We placed a micropuncture sheath performed fistulogram as well as central venography.  With the above findings we elected to primarily stent.  We placed the long J-wire placed a 7 French sheath.  We then crossed with an 014 wire.  We primarily placed a 7 x 50 mm Viabahn.  This was postdilated with 7 mm Sterling balloon.  While there was inflated we perform retrograde imaging which demonstrated patent arterial anastomosis.  Satisfied we allowed our balloon down after 1 minute of inflation.  Completion demonstrated less than 20% residual stenosis.  There was a very strong thrill.  The cannulation site was again anesthetized 1% lidocaine.  A 4-0 Monocryl suture was placed and sheath and wire were removed.  The patient tolerated procedure without any complication. Contrast: 20 cc Brandon C. Donzetta Matters, MD [facility] Office: [phone] Pager: [phone]  VAS US DUPLEX DIALYSIS ACCESS (AVF, AVG)  Result Date: 09/20/2019 DIALYSIS ACCESS Reason for Exam: Swelling, warmth of left upper extremity. Access Site: Left Upper Extremity. Access Type: Upper arm loop AVG. History: LUE A/V shuntogram 09/16/2019. Performing Technologist: Maudry Mayhew MHA, RDMS, RVT, RDCS  Examination Guidelines: A complete evaluation includes B-mode imaging, spectral Doppler, color Doppler, and power Doppler as needed of all accessible portions of each vessel. Unilateral testing is considered an integral part of a  complete examination. Limited examinations for reoccurring indications may be performed as noted.  Findings:   +--------------------+----------+-----------------+--------+ AVG                 PSV (cm/s)Flow Vol (mL/min)Describe +--------------------+----------+-----------------+--------+ Native artery inflow   193                              +--------------------+----------+-----------------+--------+ Arterial anastomosis   229                              +--------------------+----------+-----------------+--------+ Prox graft             395                              +--------------------+----------+-----------------+--------+ Mid graft              275                              +--------------------+----------+-----------------+--------+  Distal graft           344                              +--------------------+----------+-----------------+--------+ Venous anastomosis     280                              +--------------------+----------+-----------------+--------+ Venous outflow         287                              +--------------------+----------+-----------------+--------+  Summary: The arterial anastomosis exhibits visual narrowing by color Doppler without significantly elevated velocities; cannot exclude stenosis. Some perigraft flow is visualized; cannot exclude infection. *See table(s) above for measurements and observations.  Diagnosing physician: Servando Snare MD Electronically signed by Servando Snare MD on 09/20/2019 at 3:39:51 PM.   --------------------------------------------------------------------------------   Final    UE VENOUS DUPLEX ([place] & [facility] 7 am - 7 pm)  Result Date: 09/20/2019 UPPER VENOUS STUDY  Indications: Swelling, and Pain Limitations: LUE AVG, poor ultrasound/tissue interface and body habitus. Comparison Study: No prior study Performing Technologist: Maudry Mayhew MHA, RDMS, RVT, RDCS  Examination Guidelines: A complete evaluation  includes B-mode imaging, spectral Doppler, color Doppler, and power Doppler as needed of all accessible portions of each vessel. Bilateral testing is considered an integral part of a complete examination. Limited examinations for reoccurring indications may be performed as noted.  Left Findings: +----------+------------+---------+-----------+----------+--------------+ LEFT      CompressiblePhasicitySpontaneousProperties   Summary     +----------+------------+---------+-----------+----------+--------------+ IJV           Full       Yes       Yes                             +----------+------------+---------+-----------+----------+--------------+ Subclavian    Full       Yes       Yes                             +----------+------------+---------+-----------+----------+--------------+ Axillary      Full       Yes       Yes                             +----------+------------+---------+-----------+----------+--------------+ Brachial      Full       Yes       Yes                             +----------+------------+---------+-----------+----------+--------------+ Radial        Full                                                 +----------+------------+---------+-----------+----------+--------------+ Ulnar         Full                                                 +----------+------------+---------+-----------+----------+--------------+  Cephalic      Full                                                 +----------+------------+---------+-----------+----------+--------------+ Basilic                                             Not visualized +----------+------------+---------+-----------+----------+--------------+  Summary:  Left: No evidence of deep vein thrombosis in the upper extremity. No evidence of superficial vein thrombosis involving the visualized veins of the upper extremity.  *See table(s) above for measurements and observations.  Diagnosing  physician: Servando Snare MD Electronically signed by Servando Snare MD on 09/20/2019 at 3:39:58 PM.    Final      Subjective: Pt seen in ER, no acute complaints medically, reports no pain, did report he was not happy how a renal nurse had spoke to him "im not stupid'  Objective: Vitals:   09/21/19 1243 09/21/19 1300 09/21/19 1330 09/21/19 1400  BP: (!) 211/85 (!) 203/77 (!) 200/79   Pulse: 61 60 60 (!) 59  Resp: 15 16 16    Temp:      TempSrc:      SpO2:      Weight:      Height:       No intake or output data in the 24 hours ending 09/21/19 1426 Filed Weights   09/21/19 0100  Weight: 95.3 kg    Examination:  General exam: Appears calm and comfortable  Respiratory system: Clear to auscultation. Respiratory effort normal. Cardiovascular system: S1 & S2 heard, RRR. No JVD, murmurs, rubs, gallops or clicks. No pedal edema. Gastrointestinal system: Abdomen is nondistended, soft and nontender. No organomegaly or masses felt. Normal bowel sounds heard. Central nervous system: Alert and oriented. No focal neurological deficits. Extremities: fistula in place, no s/s of compromise, 1+ edema bilat le Skin: No new rashes, lesions or ulcers Psychiatry: Judgement and insight appear normal. Mood & affect appropriate.     Data Reviewed: I have personally reviewed following labs and imaging studies  CBC: Recent Labs  Lab 09/16/19 0636 09/20/19 1353 09/20/19 1400 09/21/19 0238  WBC  --  5.6  --  5.6  NEUTROABS  --  3.5  --   --   HGB 8.2* 7.5* 7.8* 8.2*  HCT 24.0* 25.0* 23.0* 27.7*  MCV  --  92.3  --  91.7  PLT  --  176  --  696   Basic Metabolic Panel: Recent Labs  Lab 09/16/19 0636 09/20/19 1353 09/20/19 1400 09/21/19 0238 09/21/19 1045  NA 142 138 141 140  --   K 5.1 6.0* 5.9* 6.0*  --   CL 114* 112* 116* 113*  --   CO2  --  16*  --  17*  --   GLUCOSE 82 126* 115* 89  --   BUN 85* 91* 92* 89*  --   CREATININE 7.40* 7.04* 7.40* 6.95*  --   CALCIUM  --  8.1*  --  8.5*   --   PHOS  --   --   --   --  5.4*   GFR: Estimated Creatinine Clearance: 11.5 mL/min (A) (by C-G formula based on SCr of 6.95 mg/dL (H)). Liver Function Tests: Recent Labs  Lab 09/20/19 1353  AST 61*  ALT 88*  ALKPHOS 63  BILITOT 0.3  PROT 5.9*  ALBUMIN 3.0*   No results for input(s): LIPASE, AMYLASE in the last 168 hours. No results for input(s): AMMONIA in the last 168 hours. Coagulation Profile: No results for input(s): INR, PROTIME in the last 168 hours. Cardiac Enzymes: No results for input(s): CKTOTAL, CKMB, CKMBINDEX, TROPONINI in the last 168 hours. BNP (last 3 results) No results for input(s): PROBNP in the last 8760 hours. HbA1C: No results for input(s): HGBA1C in the last 72 hours. CBG: No results for input(s): GLUCAP in the last 168 hours. Lipid Profile: No results for input(s): CHOL, HDL, LDLCALC, TRIG, CHOLHDL, LDLDIRECT in the last 72 hours. Thyroid Function Tests: No results for input(s): TSH, T4TOTAL, FREET4, T3FREE, THYROIDAB in the last 72 hours. Anemia Panel: Recent Labs    09/21/19 1045  FERRITIN 134  TIBC 235*  IRON 48   Sepsis Labs: No results for input(s): PROCALCITON, LATICACIDVEN in the last 168 hours.  Recent Results (from the past 240 hour(s))  SARS CORONAVIRUS 2 (TAT 6-24 HRS) Nasopharyngeal Nasopharyngeal Swab     Status: None   Collection Time: 09/14/19  1:55 PM   Specimen: Nasopharyngeal Swab  Result Value Ref Range Status   SARS Coronavirus 2 NEGATIVE NEGATIVE Final    Comment: (NOTE) SARS-CoV-2 target nucleic acids are NOT DETECTED.  The SARS-CoV-2 RNA is generally detectable in upper and lower respiratory specimens during the acute phase of infection. Negative results do not preclude SARS-CoV-2 infection, do not rule out co-infections with other pathogens, and should not be used as the sole basis for treatment or other patient management decisions. Negative results must be combined with clinical observations, patient  history, and epidemiological information. The expected result is Negative.  Fact Sheet for Patients: SugarRoll.be  Fact Sheet for Healthcare Providers: https://www.woods-mathews.com/  This test is not yet approved or cleared by the [place] FDA and  has been authorized for detection and/or diagnosis of SARS-CoV-2 by FDA under an Emergency Use Authorization (EUA). This EUA will remain  in effect (meaning this test can be used) for the duration of the COVID-19 declaration under Se ction 564(b)(1) of the Act, 21 U.S.C. section 360bbb-3(b)(1), unless the authorization is terminated or revoked sooner.  Performed at [facility], [place] [address] [place], [place] [address]   SARS Coronavirus 2 by RT PCR (hospital order, performed in [facility] hospital lab) Nasopharyngeal Nasopharyngeal Swab     Status: None   Collection Time: 09/21/19  2:38 AM   Specimen: Nasopharyngeal Swab  Result Value Ref Range Status   SARS Coronavirus 2 NEGATIVE NEGATIVE Final    Comment: (NOTE) SARS-CoV-2 target nucleic acids are NOT DETECTED.  The SARS-CoV-2 RNA is generally detectable in upper and lower respiratory specimens during the acute phase of infection. The lowest concentration of SARS-CoV-2 viral copies this assay can detect is 250 copies / mL. A negative result does not preclude SARS-CoV-2 infection and should not be used as the sole basis for treatment or other patient management decisions.  A negative result may occur with improper specimen collection / handling, submission of specimen other than nasopharyngeal swab, presence of viral mutation(s) within the areas targeted by this assay, and inadequate number of viral copies (<250 copies / mL). A negative result must be combined with clinical observations, patient history, and epidemiological information.  Fact Sheet for Patients:   StrictlyIdeas.no  Fact Sheet for  Healthcare Providers: BankingDealers.co.za  This test is not yet approved or  cleared by the [place] and has been authorized for detection and/or diagnosis of SARS-CoV-2 by FDA under an Emergency Use Authorization (EUA).  This EUA will remain in effect (meaning this test can be used) for the duration of the COVID-19 declaration under Section 564(b)(1) of the Act, 21 U.S.C. section 360bbb-3(b)(1), unless the authorization is terminated or revoked sooner.  Performed at [facility], [place] [address], [place], [place] [address]          Radiology Studies: VAS US DUPLEX DIALYSIS ACCESS (AVF, AVG)  Result Date: 09/20/2019 DIALYSIS ACCESS Reason for Exam: Swelling, warmth of left upper extremity. Access Site: Left Upper Extremity. Access Type: Upper arm loop AVG. History: LUE A/V shuntogram 09/16/2019. Performing Technologist: Maudry Mayhew MHA, RDMS, RVT, RDCS  Examination Guidelines: A complete evaluation includes B-mode imaging, spectral Doppler, color Doppler, and power Doppler as needed of all accessible portions of each vessel. Unilateral testing is considered an integral part of a complete examination. Limited examinations for reoccurring indications may be performed as noted.  Findings:   +--------------------+----------+-----------------+--------+ AVG                 PSV (cm/s)Flow Vol (mL/min)Describe +--------------------+----------+-----------------+--------+ Native artery inflow   193                              +--------------------+----------+-----------------+--------+ Arterial anastomosis   229                              +--------------------+----------+-----------------+--------+ Prox graft             395                              +--------------------+----------+-----------------+--------+ Mid graft              275                              +--------------------+----------+-----------------+--------+  Distal graft           344                              +--------------------+----------+-----------------+--------+ Venous anastomosis     280                              +--------------------+----------+-----------------+--------+ Venous outflow         287                              +--------------------+----------+-----------------+--------+  Summary: The arterial anastomosis exhibits visual narrowing by color Doppler without significantly elevated velocities; cannot exclude stenosis. Some perigraft flow is visualized; cannot exclude infection. *See table(s) above for measurements and observations.  Diagnosing physician: Servando Snare MD Electronically signed by Servando Snare MD on 09/20/2019 at 3:39:51 PM.   --------------------------------------------------------------------------------   Final    UE VENOUS DUPLEX ([place] & [facility] 7 am - 7 pm)  Result Date: 09/20/2019 UPPER VENOUS STUDY  Indications: Swelling, and Pain Limitations: LUE AVG, poor ultrasound/tissue interface and body habitus. Comparison Study: No prior study Performing Technologist: Maudry Mayhew MHA, RDMS,  RVT, RDCS  Examination Guidelines: A complete evaluation includes B-mode imaging, spectral Doppler, color Doppler, and power Doppler as needed of all accessible portions of each vessel. Bilateral testing is considered an integral part of a complete examination. Limited examinations for reoccurring indications may be performed as noted.  Left Findings: +----------+------------+---------+-----------+----------+--------------+ LEFT      CompressiblePhasicitySpontaneousProperties   Summary     +----------+------------+---------+-----------+----------+--------------+ IJV           Full       Yes       Yes                             +----------+------------+---------+-----------+----------+--------------+ Subclavian    Full       Yes       Yes                              +----------+------------+---------+-----------+----------+--------------+ Axillary      Full       Yes       Yes                             +----------+------------+---------+-----------+----------+--------------+ Brachial      Full       Yes       Yes                             +----------+------------+---------+-----------+----------+--------------+ Radial        Full                                                 +----------+------------+---------+-----------+----------+--------------+ Ulnar         Full                                                 +----------+------------+---------+-----------+----------+--------------+ Cephalic      Full                                                 +----------+------------+---------+-----------+----------+--------------+ Basilic                                             Not visualized +----------+------------+---------+-----------+----------+--------------+  Summary:  Left: No evidence of deep vein thrombosis in the upper extremity. No evidence of superficial vein thrombosis involving the visualized veins of the upper extremity.  *See table(s) above for measurements and observations.  Diagnosing physician: Servando Snare MD Electronically signed by Servando Snare MD on 09/20/2019 at 3:39:58 PM.    Final         Scheduled Meds: . Chlorhexidine Gluconate Cloth  6 each Topical Q0600  . cloNIDine  0.1 mg Oral Daily  . nebivolol  10 mg Oral QHS  . tamsulosin  0.4 mg Oral QHS   Continuous Infusions: . sodium chloride    .  sodium chloride    . heparin       LOS: 0 days    Time spent: 35 min    Nicolette Bang, MD [facility]  If 7PM-7AM, please contact night-coverage  09/21/2019, 2:26 PM

## 2019-09-21 NOTE — Progress Notes (Signed)
ANTICOAGULATION CONSULT NOTE - Initial Consult  Pharmacy Consult for heparin Indication: h/o VTE w/ coagulation disorder  Allergies  Allergen Reactions   Gabapentin     Felt like I was out of my head, slept all day, confusion    Sulfa Antibiotics Itching    Patient Measurements: Height: 5\' 11"  (180.3 cm) Weight: 95.3 kg (210 lb 1.6 oz) IBW/kg (Calculated) : 75.3 Heparin Dosing Weight: 95kg  Vital Signs: Temp: 97.7 F (36.5 C) (09/17 1900) Temp Source: Oral (09/17 1900) BP: 181/105 (09/18 0015) Pulse Rate: 66 (09/17 2229)  Labs: Recent Labs    09/20/19 1353 09/20/19 1400  HGB 7.5* 7.8*  HCT 25.0* 23.0*  PLT 176  --   CREATININE 7.04* 7.40*    Estimated Creatinine Clearance: 10.8 mL/min (A) (by C-G formula based on SCr of 7.4 mg/dL (H)).   Medical History: Past Medical History:  Diagnosis Date   Arthritis    Chronic kidney disease    Chronic kidney disease    Diabetes ([place]) 02/012017   Fainting    Gait abnormality 10/11/2016   GERD (gastroesophageal reflux disease)    Gout    Headache    Hypercholesteremia    Hypercholesterolemia    Hypertension    Peripheral neuropathy 04/02/2019   Pulmonary embolism (HCC)    Shortness of breath    Sleep apnea    does wear cpap   Wears dentures     Assessment: 69yo male sent to ED for HD though has not yet used new HD access, to begin heparin bridge given concern for possible procedure; pt reports that Xarelto has been held since last week.  Goal of Therapy:  Heparin level 0.3-0.7 units/ml aPTT 66-102 seconds Monitor platelets by anticoagulation protocol: Yes   Plan:  Will give heparin 2000 units IV bolus followed by gtt at 1300 units/hr and monitor heparin levels and CBC; will obtain PTT with initial labs in case Xarelto is still affecting anti-Xa.  Wynona Neat, PharmD, BCPS  09/21/2019,1:55 AM

## 2019-09-21 NOTE — Progress Notes (Signed)
Pt continues to Hypertensive; notifid Dr. Jannifer Hick and she ordered Clonidine 0.1mg  PO once.Boyd Kerbs, RN made aware during report and was advised to administer the clonidine on the floor; pharmacy had not sent it at the time of the pt's departure off the unit.

## 2019-09-21 NOTE — Progress Notes (Signed)
ANTICOAGULATION CONSULT NOTE - Follow Up Consult  Pharmacy Consult for heparin Indication: h/o VTE w/ coagulation disorder  Allergies  Allergen Reactions  . Gabapentin     Felt like I was out of my head, slept all day, confusion   . Sulfa Antibiotics Itching    Patient Measurements: Height: 5\' 7"  (170.2 cm) Weight: 91.7 kg (202 lb 1.6 oz) IBW/kg (Calculated) : 66.1 Heparin Dosing Weight: 95kg  Vital Signs: Temp: 98 F (36.7 C) (09/18 1954) Temp Source: Oral (09/18 1954) BP: 225/79 (09/18 1954) Pulse Rate: 64 (09/18 1954)  Labs: Recent Labs    09/20/19 1353 09/20/19 1353 09/20/19 1400 09/21/19 0238 09/21/19 1045 09/21/19 2055  HGB 7.5*   < > 7.8* 8.2*  --   --   HCT 25.0*  --  23.0* 27.7*  --   --   PLT 176  --   --  200  --   --   APTT  --   --   --   --  164* 192*  HEPARINUNFRC  --   --   --   --  0.72* 0.44  CREATININE 7.04*  --  7.40* 6.95*  --   --    < > = values in this interval not displayed.    Estimated Creatinine Clearance: 10.5 mL/min (A) (by C-G formula based on SCr of 6.95 mg/dL (H)).   Medical History: Past Medical History:  Diagnosis Date  . Arthritis   . Chronic kidney disease   . Chronic kidney disease   . Diabetes ([place]) 02/012017  . Fainting   . Gait abnormality 10/11/2016  . GERD (gastroesophageal reflux disease)   . Gout   . Headache   . Hypercholesteremia   . Hypercholesterolemia   . Hypertension   . Peripheral neuropathy 04/02/2019  . Pulmonary embolism ([place])   . Shortness of breath   . Sleep apnea    does wear cpap  . Wears dentures     Assessment: 69yo male sent to ED for HD though has not yet used new HD access, to begin heparin bridge given concern for possible procedure; pt reports that Xarelto has been held since last week.  Heparin level is therapeutic at 0.44, aPTT is supratherapeutic at 192 sec. Heparin level is a more accurate lab so will use that level. No s/s of overt bleeding per RN.   Goal of Therapy:   Heparin level 0.3-0.7 units/ml aPTT 66-102 seconds Monitor platelets by anticoagulation protocol: Yes   Plan:  -Continue heparin drip at 1200 units/hr -Daily heparin level and CBC -Monitor closely for s/s of bleeding   Thank you for involving pharmacy in this patient's care.  Renold Genta, PharmD, BCPS Clinical Pharmacist Clinical phone for 09/21/2019 until 3p is [phone] 09/21/2019 10:01 PM  **Pharmacist phone directory can be found on [email or web address] listed under [facility]**

## 2019-09-21 NOTE — Progress Notes (Signed)
ANTICOAGULATION CONSULT NOTE - Follow Up Consult  Pharmacy Consult for heparin Indication: h/o VTE w/ coagulation disorder  Allergies  Allergen Reactions  . Gabapentin     Felt like I was out of my head, slept all day, confusion   . Sulfa Antibiotics Itching    Patient Measurements: Height: 5\' 11"  (180.3 cm) Weight: 95.3 kg (210 lb 1.6 oz) IBW/kg (Calculated) : 75.3 Heparin Dosing Weight: 95kg  Vital Signs: BP: 207/81 (09/18 1145) Pulse Rate: 65 (09/18 1145)  Labs: Recent Labs    09/20/19 1353 09/20/19 1353 09/20/19 1400 09/21/19 0238 09/21/19 1045  HGB 7.5*   < > 7.8* 8.2*  --   HCT 25.0*  --  23.0* 27.7*  --   PLT 176  --   --  200  --   APTT  --   --   --   --  164*  HEPARINUNFRC  --   --   --   --  0.72*  CREATININE 7.04*  --  7.40* 6.95*  --    < > = values in this interval not displayed.    Estimated Creatinine Clearance: 11.5 mL/min (A) (by C-G formula based on SCr of 6.95 mg/dL (H)).   Medical History: Past Medical History:  Diagnosis Date  . Arthritis   . Chronic kidney disease   . Chronic kidney disease   . Diabetes ([place]) 02/012017  . Fainting   . Gait abnormality 10/11/2016  . GERD (gastroesophageal reflux disease)   . Gout   . Headache   . Hypercholesteremia   . Hypercholesterolemia   . Hypertension   . Peripheral neuropathy 04/02/2019  . Pulmonary embolism ([place])   . Shortness of breath   . Sleep apnea    does wear cpap  . Wears dentures     Assessment: 69yo male sent to ED for HD though has not yet used new HD access, to begin heparin bridge given concern for possible procedure; pt reports that Xarelto has been held since last week.  Currently on IV heparin at 1300 units/hr. Initial HL and aPTT are a bit discordant. Given aPTT is high at 164, will hold infusion x 1 hour. No s/s of overt bleeding per RN  Patient is to receive HD today   Goal of Therapy:  Heparin level 0.3-0.7 units/ml aPTT 66-102 seconds Monitor platelets by  anticoagulation protocol: Yes   Plan:  -Hold heparin x 1 hour, then resume IV heparin at 1200 units/hr  -Will f/u HL and APTT in 8 hrs -Monitor closely for s/s of bleeding   Albertina Parr, PharmD., BCPS, BCCCP Clinical Pharmacist Clinical phone for 09/21/19 until 10pm: [phone] If after 10pm, please refer to [facility] for unit-specific pharmacist

## 2019-09-22 DIAGNOSIS — M1990 Unspecified osteoarthritis, unspecified site: Secondary | ICD-10-CM | POA: Diagnosis present

## 2019-09-22 DIAGNOSIS — Z79899 Other long term (current) drug therapy: Secondary | ICD-10-CM | POA: Diagnosis not present

## 2019-09-22 DIAGNOSIS — N186 End stage renal disease: Secondary | ICD-10-CM | POA: Diagnosis present

## 2019-09-22 DIAGNOSIS — E1142 Type 2 diabetes mellitus with diabetic polyneuropathy: Secondary | ICD-10-CM | POA: Diagnosis present

## 2019-09-22 DIAGNOSIS — G4730 Sleep apnea, unspecified: Secondary | ICD-10-CM | POA: Diagnosis present

## 2019-09-22 DIAGNOSIS — N179 Acute kidney failure, unspecified: Secondary | ICD-10-CM | POA: Diagnosis present

## 2019-09-22 DIAGNOSIS — E559 Vitamin D deficiency, unspecified: Secondary | ICD-10-CM | POA: Diagnosis present

## 2019-09-22 DIAGNOSIS — Z7901 Long term (current) use of anticoagulants: Secondary | ICD-10-CM | POA: Diagnosis not present

## 2019-09-22 DIAGNOSIS — F17210 Nicotine dependence, cigarettes, uncomplicated: Secondary | ICD-10-CM | POA: Diagnosis present

## 2019-09-22 DIAGNOSIS — M898X9 Other specified disorders of bone, unspecified site: Secondary | ICD-10-CM | POA: Diagnosis present

## 2019-09-22 DIAGNOSIS — E1122 Type 2 diabetes mellitus with diabetic chronic kidney disease: Secondary | ICD-10-CM | POA: Diagnosis present

## 2019-09-22 DIAGNOSIS — D631 Anemia in chronic kidney disease: Secondary | ICD-10-CM | POA: Diagnosis present

## 2019-09-22 DIAGNOSIS — E669 Obesity, unspecified: Secondary | ICD-10-CM | POA: Diagnosis present

## 2019-09-22 DIAGNOSIS — E7800 Pure hypercholesterolemia, unspecified: Secondary | ICD-10-CM | POA: Diagnosis present

## 2019-09-22 DIAGNOSIS — K219 Gastro-esophageal reflux disease without esophagitis: Secondary | ICD-10-CM | POA: Diagnosis present

## 2019-09-22 DIAGNOSIS — D689 Coagulation defect, unspecified: Secondary | ICD-10-CM | POA: Diagnosis present

## 2019-09-22 DIAGNOSIS — E872 Acidosis: Secondary | ICD-10-CM | POA: Diagnosis present

## 2019-09-22 DIAGNOSIS — I120 Hypertensive chronic kidney disease with stage 5 chronic kidney disease or end stage renal disease: Secondary | ICD-10-CM | POA: Diagnosis present

## 2019-09-22 DIAGNOSIS — N2581 Secondary hyperparathyroidism of renal origin: Secondary | ICD-10-CM | POA: Diagnosis present

## 2019-09-22 DIAGNOSIS — I10 Essential (primary) hypertension: Secondary | ICD-10-CM | POA: Diagnosis not present

## 2019-09-22 DIAGNOSIS — Z20822 Contact with and (suspected) exposure to covid-19: Secondary | ICD-10-CM | POA: Diagnosis present

## 2019-09-22 DIAGNOSIS — M109 Gout, unspecified: Secondary | ICD-10-CM | POA: Diagnosis present

## 2019-09-22 DIAGNOSIS — Z6829 Body mass index (BMI) 29.0-29.9, adult: Secondary | ICD-10-CM | POA: Diagnosis not present

## 2019-09-22 DIAGNOSIS — Z992 Dependence on renal dialysis: Secondary | ICD-10-CM | POA: Diagnosis not present

## 2019-09-22 DIAGNOSIS — E875 Hyperkalemia: Secondary | ICD-10-CM | POA: Diagnosis present

## 2019-09-22 LAB — CBC
HCT: 22.8 % — ABNORMAL LOW (ref 39.0–52.0)
Hemoglobin: 7.1 g/dL — ABNORMAL LOW (ref 13.0–17.0)
MCH: 27.6 pg (ref 26.0–34.0)
MCHC: 31.1 g/dL (ref 30.0–36.0)
MCV: 88.7 fL (ref 80.0–100.0)
Platelets: 151 10*3/uL (ref 150–400)
RBC: 2.57 MIL/uL — ABNORMAL LOW (ref 4.22–5.81)
RDW: 12.9 % (ref 11.5–15.5)
WBC: 5.6 10*3/uL (ref 4.0–10.5)
nRBC: 0 % (ref 0.0–0.2)

## 2019-09-22 LAB — BASIC METABOLIC PANEL
Anion gap: 8 (ref 5–15)
BUN: 55 mg/dL — ABNORMAL HIGH (ref 8–23)
CO2: 20 mmol/L — ABNORMAL LOW (ref 22–32)
Calcium: 8 mg/dL — ABNORMAL LOW (ref 8.9–10.3)
Chloride: 111 mmol/L (ref 98–111)
Creatinine, Ser: 5.47 mg/dL — ABNORMAL HIGH (ref 0.61–1.24)
GFR calc Af Amer: 11 mL/min — ABNORMAL LOW (ref >60)
GFR calc non Af Amer: 10 mL/min — ABNORMAL LOW (ref >60)
Glucose, Bld: 107 mg/dL — ABNORMAL HIGH (ref 70–99)
Potassium: 4.5 mmol/L (ref 3.5–5.1)
Sodium: 139 mmol/L (ref 135–145)

## 2019-09-22 LAB — GLUCOSE, CAPILLARY
Glucose-Capillary: 110 mg/dL — ABNORMAL HIGH (ref 70–99)
Glucose-Capillary: 106 mg/dL — ABNORMAL HIGH (ref 70–99)
Glucose-Capillary: 94 mg/dL (ref 70–99)

## 2019-09-22 LAB — APTT: aPTT: 192 seconds (ref 24–36)

## 2019-09-22 LAB — HEPARIN LEVEL (UNFRACTIONATED): Heparin Unfractionated: 0.63 IU/mL (ref 0.30–0.70)

## 2019-09-22 MED ORDER — DARBEPOETIN ALFA 40 MCG/0.4ML IJ SOSY
40.0000 ug | PREFILLED_SYRINGE | INTRAMUSCULAR | Status: DC
  Administered 2019-09-23: 40 ug via INTRAVENOUS
  Filled 2019-09-22: qty 0.4

## 2019-09-22 MED ORDER — SODIUM CHLORIDE 0.9 % IV SOLN
510.0000 mg | Freq: Once | INTRAVENOUS | Status: AC
  Administered 2019-09-22: 510 mg via INTRAVENOUS
  Filled 2019-09-22: qty 17

## 2019-09-22 MED ORDER — FUROSEMIDE 10 MG/ML IJ SOLN
120.0000 mg | Freq: Three times a day (TID) | INTRAVENOUS | Status: AC
  Administered 2019-09-22: 120 mg via INTRAVENOUS
  Filled 2019-09-22: qty 12
  Filled 2019-09-22: qty 10

## 2019-09-22 MED ORDER — HYDRALAZINE HCL 25 MG PO TABS
25.0000 mg | ORAL_TABLET | Freq: Three times a day (TID) | ORAL | Status: DC
  Administered 2019-09-22 – 2019-09-24 (×6): 25 mg via ORAL
  Filled 2019-09-22 (×5): qty 1

## 2019-09-22 NOTE — Progress Notes (Signed)
Per RN, pt in dialysis.  Will put in a new consult when he returns.

## 2019-09-22 NOTE — Progress Notes (Signed)
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  Pt with recent progression to ESRD.  AV graft placement 8/12. Had swelling of LUE so got fistulagram 9/13 which showed patent graft, 60% stenosis which Dr. Donzetta Matters stented.  Pt sent in from nephrologists office today as he now needs to start HD with K of 6.0.  Pt's only complaint is ongoing swelling to the LUE.  Denies fevers, chills.   Assessment & Plan:   Principal Problem:   ESRD (end stage renal disease) ([place]) Active Problems:   Essential hypertension   Type 2 diabetes mellitus with renal complication (HCC)   Coagulation defect (HCC)   Hyperkalemia, diminished renal excretion   1. ESRD - now with mild hyperkalemia 1. Getting lokelma for tonight 2. Needs to start dialysis in AM 3. EDP spoke with nephrology 4. They asked for med admit and they will start dialysis in AM 5. Regarding access - 1. Has LUE graft with at least some flow, just got stent a couple of days ago 2. Does have LUE edema 3. I dont THINK graft is infected at this point, couldn't rule out with [place] but no WBC no SIRS at all, no definite findings of infection and just edema despite having graft and some swelling for a month now (usually id expect patient with infected graft to be much much sicker by this point). 4. Will hold off on ABx for the moment. 2. Chronic anticoagulation - 1. For h/o PE and coagulation disorder 2. Still taking Xarelto chronically for this 3. Presumably this needs to change (probably to eliquis ultimately would be my guess) now that he is going on dialysis for ESRD 4. Hold Xarelto 5. Use heparin gtt instead  for the moment, short acting just-in-case they have access problems tomorrow AM, and need to do some procedure to get other access for dialysis. 3. HTN - 1. Med rec pending, cont all home meds 2. Got clonidine and bystolic evening doses in ED as BP is 371 systolic 4. DM2 - 1. Diet controlled it appears  DVT prophylaxis: On:Hep gtt  Code Status: FULL    Code Status Orders  (From admission, onward)         Start     Ordered   09/21/19 0134  Full code  Continuous        09/21/19 0134        Code Status History    This patient has a current code status but no historical code status.   Advance Care Planning Activity     Family Communication: spoke with son sat, planned for likely d/c monday  Disposition Plan:   Will remian in obs, discussed with UR, pt will likley remian in hospital till Monday or Tuesday fro initiation of new HD and to establish HD at [facility] called: None Admission status: Observation   Consultants:   neph  Procedures:  PERIPHERAL VASCULAR CATHETERIZATION  Result Date: 09/16/2019 Patient name: Duane Ortiz MRN: [id number] DOB: 1950/06/26 Sex: male 09/16/2019 Pre-operative Diagnosis: esrd, swelling left upper extremity Post-operative diagnosis:  Same Surgeon:  Eda Paschal. Donzetta Matters, MD Procedure Performed: 1.  Ultrasound-guided cannulation  left arm AV graft 2.  With the upper extremity and central fistulogram 3.  Stent of graft venous anastomosis with 7 x 50 mm Viabahn Indications: 69 year old male with chronic kidney disease now considered in need of dialysis has swelling of his left upper extremity following left arm AV graft placement.  He is now indicated for fistulogram and possible intervention. Findings: The graft itself is patent.  There is an end to end anastomosis to the axillary vein.  There was approximately 60% stenosis at the outflow.  Central veins are patent.  After stent placement there is less than 20% residual stenosis.  Stent was  balloon dilated to 7 mm and retrograde imaging demonstrated patent arterial anastomosis.  Procedure:  The patient was identified in the holding area and taken to room 8.  The patient was then placed supine on the table and prepped and draped in the usual sterile fashion.  A time out was called.  Ultrasound was used to evaluate the left arm AV graft.  The area was anesthetized 1% lidocaine cannulated with micropuncture needle followed the wire sheath.  This was done with ultrasound guidance and an image was saved the permanent record.  We placed a micropuncture sheath performed fistulogram as well as central venography.  With the above findings we elected to primarily stent.  We placed the long J-wire placed a 7 French sheath.  We then crossed with an 014 wire.  We primarily placed a 7 x 50 mm Viabahn.  This was postdilated with 7 mm Sterling balloon.  While there was inflated we perform retrograde imaging which demonstrated patent arterial anastomosis.  Satisfied we allowed our balloon down after 1 minute of inflation.  Completion demonstrated less than 20% residual stenosis.  There was a very strong thrill.  The cannulation site was again anesthetized 1% lidocaine.  A 4-0 Monocryl suture was placed and sheath and wire were removed.  The patient tolerated procedure without any complication. Contrast: 20 cc Brandon C. Donzetta Matters, MD [facility] Office: [phone] Pager: [phone]  VAS US DUPLEX DIALYSIS ACCESS (AVF, AVG)  Result Date: 09/20/2019 DIALYSIS ACCESS Reason for Exam: Swelling, warmth of left upper extremity. Access Site: Left Upper Extremity. Access Type: Upper arm loop AVG. History: LUE A/V shuntogram 09/16/2019. Performing Technologist: Maudry Mayhew MHA, RDMS, RVT, RDCS  Examination Guidelines: A complete evaluation includes B-mode imaging, spectral Doppler, color Doppler, and power Doppler as needed of all accessible portions of each vessel. Unilateral testing is  considered an integral part of a complete examination. Limited examinations for reoccurring indications may be performed as noted.  Findings:   +--------------------+----------+-----------------+--------+ AVG                 PSV (cm/s)Flow Vol (mL/min)Describe +--------------------+----------+-----------------+--------+ Native artery inflow   193                              +--------------------+----------+-----------------+--------+ Arterial anastomosis   229                              +--------------------+----------+-----------------+--------+ Prox graft             395                              +--------------------+----------+-----------------+--------+ Mid graft  275                              +--------------------+----------+-----------------+--------+ Distal graft           344                              +--------------------+----------+-----------------+--------+ Venous anastomosis     280                              +--------------------+----------+-----------------+--------+ Venous outflow         287                              +--------------------+----------+-----------------+--------+  Summary: The arterial anastomosis exhibits visual narrowing by color Doppler without significantly elevated velocities; cannot exclude stenosis. Some perigraft flow is visualized; cannot exclude infection. *See table(s) above for measurements and observations.  Diagnosing physician: Servando Snare MD Electronically signed by Servando Snare MD on 09/20/2019 at 3:39:51 PM.   --------------------------------------------------------------------------------   Final    UE VENOUS DUPLEX ([place] 7 am - 7 pm)  Result Date: 09/20/2019 UPPER VENOUS STUDY  Indications: Swelling, and Pain Limitations: LUE AVG, poor ultrasound/tissue interface and body habitus. Comparison Study: No prior study Performing Technologist: Maudry Mayhew MHA, RDMS, RVT, RDCS  Examination  Guidelines: A complete evaluation includes B-mode imaging, spectral Doppler, color Doppler, and power Doppler as needed of all accessible portions of each vessel. Bilateral testing is considered an integral part of a complete examination. Limited examinations for reoccurring indications may be performed as noted.  Left Findings: +----------+------------+---------+-----------+----------+--------------+ LEFT      CompressiblePhasicitySpontaneousProperties   Summary     +----------+------------+---------+-----------+----------+--------------+ IJV           Full       Yes       Yes                             +----------+------------+---------+-----------+----------+--------------+ Subclavian    Full       Yes       Yes                             +----------+------------+---------+-----------+----------+--------------+ Axillary      Full       Yes       Yes                             +----------+------------+---------+-----------+----------+--------------+ Brachial      Full       Yes       Yes                             +----------+------------+---------+-----------+----------+--------------+ Radial        Full                                                 +----------+------------+---------+-----------+----------+--------------+ Ulnar         Full                                                 +----------+------------+---------+-----------+----------+--------------+  Cephalic      Full                                                 +----------+------------+---------+-----------+----------+--------------+ Basilic                                             Not visualized +----------+------------+---------+-----------+----------+--------------+  Summary:  Left: No evidence of deep vein thrombosis in the upper extremity. No evidence of superficial vein thrombosis involving the visualized veins of the upper extremity.  *See table(s) above for measurements  and observations.  Diagnosing physician: Servando Snare MD Electronically signed by Servando Snare MD on 09/20/2019 at 3:39:58 PM.    Final       Subjective: No acute events, seems to be in much better mood today, resting comofrtably, seen in HD  Objective: Vitals:   09/22/19 1030 09/22/19 1100 09/22/19 1130 09/22/19 1145  BP: (!) 176/87 (!) 181/81 (!) 182/80 (!) 190/85  Pulse: 64 66 61 62  Resp: 18 18 18 18   Temp:    98.8 F (37.1 C)  TempSrc:    Oral  SpO2:    100%  Weight:    91 kg  Height:        Intake/Output Summary (Last 24 hours) at 09/22/2019 1403 Last data filed at 09/22/2019 1145 Gross per 24 hour  Intake 360 ml  Output 3600 ml  Net -3240 ml   Filed Weights   09/21/19 1617 09/22/19 0915 09/22/19 1145  Weight: 91.7 kg 92.6 kg 91 kg    Examination:   General exam: Appears calm and comfortable  Respiratory system: Clear to auscultation. Respiratory effort normal. Cardiovascular system: S1 & S2 heard, RRR. No JVD, murmurs, rubs, gallops or clicks. No pedal edema. Gastrointestinal system: Abdomen is nondistended, soft and nontender. No organomegaly or masses felt. Normal bowel sounds heard. Central nervous system: Alert and oriented. No focal neurological deficits. Extremities: fistula in place, no s/s of compromise, 1+ edema bilat le Skin: No new rashes, lesions or ulcers Psychiatry: Judgement and insight appear normal. Mood & affect appropriate.      Data Reviewed: I have personally reviewed following labs and imaging studies  CBC: Recent Labs  Lab 09/16/19 0636 09/20/19 1353 09/20/19 1400 09/21/19 0238 09/22/19 0814  WBC  --  5.6  --  5.6 5.6  NEUTROABS  --  3.5  --   --   --   HGB 8.2* 7.5* 7.8* 8.2* 7.1*  HCT 24.0* 25.0* 23.0* 27.7* 22.8*  MCV  --  92.3  --  91.7 88.7  PLT  --  176  --  200 702   Basic Metabolic Panel: Recent Labs  Lab 09/16/19 0636 09/20/19 1353 09/20/19 1400 09/21/19 0238 09/21/19 1045 09/22/19 0814  NA 142 138 141 140   --  139  K 5.1 6.0* 5.9* 6.0*  --  4.5  CL 114* 112* 116* 113*  --  111  CO2  --  16*  --  17*  --  20*  GLUCOSE 82 126* 115* 89  --  107*  BUN 85* 91* 92* 89*  --  55*  CREATININE 7.40* 7.04* 7.40* 6.95*  --  5.47*  CALCIUM  --  8.1*  --  8.5*  --  8.0*  PHOS  --   --   --   --  5.4*  --    GFR: Estimated Creatinine Clearance: 13.3 mL/min (A) (by C-G formula based on SCr of 5.47 mg/dL (H)). Liver Function Tests: Recent Labs  Lab 09/20/19 1353  AST 61*  ALT 88*  ALKPHOS 63  BILITOT 0.3  PROT 5.9*  ALBUMIN 3.0*   No results for input(s): LIPASE, AMYLASE in the last 168 hours. No results for input(s): AMMONIA in the last 168 hours. Coagulation Profile: No results for input(s): INR, PROTIME in the last 168 hours. Cardiac Enzymes: No results for input(s): CKTOTAL, CKMB, CKMBINDEX, TROPONINI in the last 168 hours. BNP (last 3 results) No results for input(s): PROBNP in the last 8760 hours. HbA1C: No results for input(s): HGBA1C in the last 72 hours. CBG: Recent Labs  Lab 09/21/19 1542 09/21/19 2215 09/22/19 0754  GLUCAP 106* 122* 110*   Lipid Profile: No results for input(s): CHOL, HDL, LDLCALC, TRIG, CHOLHDL, LDLDIRECT in the last 72 hours. Thyroid Function Tests: No results for input(s): TSH, T4TOTAL, FREET4, T3FREE, THYROIDAB in the last 72 hours. Anemia Panel: Recent Labs    09/21/19 1045  FERRITIN 134  TIBC 235*  IRON 48   Sepsis Labs: No results for input(s): PROCALCITON, LATICACIDVEN in the last 168 hours.  Recent Results (from the past 240 hour(s))  SARS CORONAVIRUS 2 (TAT 6-24 HRS) Nasopharyngeal Nasopharyngeal Swab     Status: None   Collection Time: 09/14/19  1:55 PM   Specimen: Nasopharyngeal Swab  Result Value Ref Range Status   SARS Coronavirus 2 NEGATIVE NEGATIVE Final    Comment: (NOTE) SARS-CoV-2 target nucleic acids are NOT DETECTED.  The SARS-CoV-2 RNA is generally detectable in upper and lower respiratory specimens during the acute  phase of infection. Negative results do not preclude SARS-CoV-2 infection, do not rule out co-infections with other pathogens, and should not be used as the sole basis for treatment or other patient management decisions. Negative results must be combined with clinical observations, patient history, and epidemiological information. The expected result is Negative.  Fact Sheet for Patients: SugarRoll.be  Fact Sheet for Healthcare Providers: https://www.woods-mathews.com/  This test is not yet approved or cleared by the [place] FDA and  has been authorized for detection and/or diagnosis of SARS-CoV-2 by FDA under an Emergency Use Authorization (EUA). This EUA will remain  in effect (meaning this test can be used) for the duration of the COVID-19 declaration under Se ction 564(b)(1) of the Act, 21 U.S.C. section 360bbb-3(b)(1), unless the authorization is terminated or revoked sooner.  Performed at [facility], [place] [address], [place], [place] [address]   SARS Coronavirus 2 by RT PCR (hospital order, performed in [facility] hospital lab) Nasopharyngeal Nasopharyngeal Swab     Status: None   Collection Time: 09/21/19  2:38 AM   Specimen: Nasopharyngeal Swab  Result Value Ref Range Status   SARS Coronavirus 2 NEGATIVE NEGATIVE Final    Comment: (NOTE) SARS-CoV-2 target nucleic acids are NOT DETECTED.  The SARS-CoV-2 RNA is generally detectable in upper and lower respiratory specimens during the acute phase of infection. The lowest concentration of SARS-CoV-2 viral copies this assay can detect is 250 copies / mL. A negative result does not preclude SARS-CoV-2 infection and should not be used as the sole basis for treatment or other patient management decisions.  A negative result may occur with improper specimen collection / handling, submission of specimen other  than nasopharyngeal swab, presence of viral mutation(s) within  the areas targeted by this assay, and inadequate number of viral copies (<250 copies / mL). A negative result must be combined with clinical observations, patient history, and epidemiological information.  Fact Sheet for Patients:   StrictlyIdeas.no  Fact Sheet for Healthcare Providers: BankingDealers.co.za  This test is not yet approved or  cleared by the [place] FDA and has been authorized for detection and/or diagnosis of SARS-CoV-2 by FDA under an Emergency Use Authorization (EUA).  This EUA will remain in effect (meaning this test can be used) for the duration of the COVID-19 declaration under Section 564(b)(1) of the Act, 21 U.S.C. section 360bbb-3(b)(1), unless the authorization is terminated or revoked sooner.  Performed at [facility], [place] [address] [place], [place] [address]          Radiology Studies: VAS US DUPLEX DIALYSIS ACCESS (AVF, AVG)  Result Date: 09/20/2019 DIALYSIS ACCESS Reason for Exam: Swelling, warmth of left upper extremity. Access Site: Left Upper Extremity. Access Type: Upper arm loop AVG. History: LUE A/V shuntogram 09/16/2019. Performing Technologist: Maudry Mayhew MHA, RDMS, RVT, RDCS  Examination Guidelines: A complete evaluation includes B-mode imaging, spectral Doppler, color Doppler, and power Doppler as needed of all accessible portions of each vessel. Unilateral testing is considered an integral part of a complete examination. Limited examinations for reoccurring indications may be performed as noted.  Findings:   +--------------------+----------+-----------------+--------+ AVG                 PSV (cm/s)Flow Vol (mL/min)Describe +--------------------+----------+-----------------+--------+ Native artery inflow   193                              +--------------------+----------+-----------------+--------+ Arterial anastomosis   229                               +--------------------+----------+-----------------+--------+ Prox graft             395                              +--------------------+----------+-----------------+--------+ Mid graft              275                              +--------------------+----------+-----------------+--------+ Distal graft           344                              +--------------------+----------+-----------------+--------+ Venous anastomosis     280                              +--------------------+----------+-----------------+--------+ Venous outflow         287                              +--------------------+----------+-----------------+--------+  Summary: The arterial anastomosis exhibits visual narrowing by color Doppler without significantly elevated velocities; cannot exclude stenosis. Some perigraft flow is visualized; cannot exclude infection. *See table(s) above for measurements and observations.  Diagnosing physician: Servando Snare MD Electronically signed by Servando Snare MD on 09/20/2019  at 3:39:51 PM.   --------------------------------------------------------------------------------   Final    UE VENOUS DUPLEX ([place] [facility] 7 am - 7 pm)  Result Date: 09/20/2019 UPPER VENOUS STUDY  Indications: Swelling, and Pain Limitations: LUE AVG, poor ultrasound/tissue interface and body habitus. Comparison Study: No prior study Performing Technologist: Maudry Mayhew MHA, RDMS, RVT, RDCS  Examination Guidelines: A complete evaluation includes B-mode imaging, spectral Doppler, color Doppler, and power Doppler as needed of all accessible portions of each vessel. Bilateral testing is considered an integral part of a complete examination. Limited examinations for reoccurring indications may be performed as noted.  Left Findings: +----------+------------+---------+-----------+----------+--------------+ LEFT      CompressiblePhasicitySpontaneousProperties   Summary      +----------+------------+---------+-----------+----------+--------------+ IJV           Full       Yes       Yes                             +----------+------------+---------+-----------+----------+--------------+ Subclavian    Full       Yes       Yes                             +----------+------------+---------+-----------+----------+--------------+ Axillary      Full       Yes       Yes                             +----------+------------+---------+-----------+----------+--------------+ Brachial      Full       Yes       Yes                             +----------+------------+---------+-----------+----------+--------------+ Radial        Full                                                 +----------+------------+---------+-----------+----------+--------------+ Ulnar         Full                                                 +----------+------------+---------+-----------+----------+--------------+ Cephalic      Full                                                 +----------+------------+---------+-----------+----------+--------------+ Basilic                                             Not visualized +----------+------------+---------+-----------+----------+--------------+  Summary:  Left: No evidence of deep vein thrombosis in the upper extremity. No evidence of superficial vein thrombosis involving the visualized veins of the upper extremity.  *See table(s) above for measurements and observations.  Diagnosing physician: Servando Snare MD Electronically signed by Servando Snare MD on 09/20/2019 at 3:39:58 PM.  Final         Scheduled Meds: . amLODipine  5 mg Oral Daily  . Chlorhexidine Gluconate Cloth  6 each Topical Q0600  . cloNIDine  0.1 mg Oral TID  . [START ON 09/23/2019] darbepoetin (ARANESP) injection - DIALYSIS  40 mcg Intravenous Q Mon-HD  . hydrALAZINE  25 mg Oral Q8H  . nebivolol  10 mg Oral QHS  . tamsulosin  0.4 mg Oral QHS    Continuous Infusions: . furosemide    . heparin 1,200 Units/hr (09/22/19 0206)     LOS: 0 days    Time spent: [place]    Nicolette Bang, MD [facility]  If 7PM-7AM, please contact night-coverage  09/22/2019, 2:03 PM

## 2019-09-22 NOTE — Progress Notes (Signed)
ANTICOAGULATION CONSULT NOTE - Follow Up Consult  Pharmacy Consult for heparin Indication: h/o VTE w/ coagulation disorder  Allergies  Allergen Reactions  . Gabapentin     Felt like I was out of my head, slept all day, confusion   . Sulfa Antibiotics Itching    Patient Measurements: Height: 5\' 7"  (170.2 cm) Weight: 91.7 kg (202 lb 1.6 oz) IBW/kg (Calculated) : 66.1 Heparin Dosing Weight: 95kg  Vital Signs: Temp: 98.7 F (37.1 C) (09/19 0752) Temp Source: Oral (09/19 0752) BP: 178/74 (09/19 0752) Pulse Rate: 65 (09/19 0752)  Labs: Recent Labs    09/20/19 1353 09/20/19 1353 09/20/19 1400 09/20/19 1400 09/21/19 0238 09/21/19 1045 09/21/19 2055 09/22/19 0814  HGB 7.5*   < > 7.8*   < > 8.2*  --   --  7.1*  HCT 25.0*   < > 23.0*  --  27.7*  --   --  22.8*  PLT 176  --   --   --  200  --   --  151  APTT  --   --   --   --   --  164* 192*  --   HEPARINUNFRC  --   --   --   --   --  0.72* 0.44 0.63  CREATININE 7.04*   < > 7.40*  --  6.95*  --   --  5.47*   < > = values in this interval not displayed.    Estimated Creatinine Clearance: 13.4 mL/min (A) (by C-G formula based on SCr of 5.47 mg/dL (H)).   Medical History: Past Medical History:  Diagnosis Date  . Arthritis   . Chronic kidney disease   . Chronic kidney disease   . Diabetes ([place]) 02/012017  . Fainting   . Gait abnormality 10/11/2016  . GERD (gastroesophageal reflux disease)   . Gout   . Headache   . Hypercholesteremia   . Hypercholesterolemia   . Hypertension   . Peripheral neuropathy 04/02/2019  . Pulmonary embolism ([place])   . Shortness of breath   . Sleep apnea    does wear cpap  . Wears dentures     Assessment: 69yo male sent to ED for HD though has not yet used new HD access, to begin heparin bridge given concern for possible procedure; pt reports that Xarelto has been held since last week.  Heparin level today is at goal (0.63). Hgb/HCT low but stable, PLT WNL/stable. No bleeding  noted.  Goal of Therapy:  Heparin level 0.3-0.7 units/ml aPTT 66-102 seconds Monitor platelets by anticoagulation protocol: Yes   Plan:  Continue heparin drip at 1200 units/hr Daily heparin level and CBC Monitor closely for s/s of bleeding   Thank you for involving pharmacy in this patient's care.  Fara Olden, PharmD PGY-1 Pharmacy Resident 09/22/2019 9:09 AM Please see AMION for all pharmacy numbers

## 2019-09-22 NOTE — Progress Notes (Signed)
Nephrology Follow-Up Consult note   Assessment/Recommendations: Duane Ortiz is a/an 69 y.o. male with a past medical history significant for ESRD, hypertension, admitted for dialysis initiation/ESRD with volume overload and hyperkalemia  # ESRD: new start dialysis.  2 out of 3 initiation sessions today.  Will involve social work on Monday to help set up outpt dialysis. Already has a unit but need to ensure he has ability to transport there as that seems to be the main issue for why he came to the hospital.  # Volume/ hypertension: Volume overloaded on exam with edema.  Also high blood pressure.  Will attempt to remove ultrafiltration with dialysis and given he still makes urine will use IV furosemide 120 mg x 2 doses today.  Likely would benefit from standing diuretic but will continue to monitor volume status daily.    Continue clonidine 3 times daily, nebivolol, started on Norvasc 5 mg daily.  Titrate off clonidine as able.  # Anemia of Chronic Kidney Disease: Hemoglobin  7.1.  Unclear why it decreased significantly today.  Iron sat 20 and ferritin 134.  Dosing Feraheme x1.  We will start Aranesp 40 mcg tomorrow # Secondary Hyperparathyroidism/Hyperphosphatemia: Phos acceptable at 5.4 so holding [place]. F/u PTH and start calcitriol if indicated. # Vascular access: AVG placed on 8/12 status post manipulation on 9/13.  Functioned well with dialysis yesterday.  #Hyperkalemia: Resolved with dialysis #NAGMA: improving with dialysis #PE: history of PE on xarelto, Stopped by admitting team and on heparin. Needs to transition to alternate agent likely eliquis  # Additional recommendations: - Dose all meds for creatinine clearance <10 ml/min  - Unless absolutely necessary, no MRIs with gadolinium.  - Implement save arm precautions. Prefer needle sticks in the dorsum of the hands or wrists. No blood pressure measurements in arm. - If blood transfusion is requested during hemodialysis sessions,  please alert [place] prior to the session.    Recommendations conveyed to primary service.    [facility] 09/22/2019 9:08 AM  ___________________________________________________________  CC: ESRD  Interval History/Subjective: Patient states that dialysis went well yesterday.  He had a slight headache but otherwise felt fine.  No other complaints.   Medications:  Current Facility-Administered Medications  Medication Dose Route Frequency Provider Last Rate Last Admin  . acetaminophen (TYLENOL) tablet 650 mg  650 mg Oral Q6H PRN Etta Quill, DO   650 mg at 09/22/19 8676   Or  . acetaminophen (TYLENOL) suppository 650 mg  650 mg Rectal Q6H PRN Etta Quill, DO      . amLODipine (NORVASC) tablet 5 mg  5 mg Oral Daily Reesa Chew, MD   5 mg at 09/21/19 1849  . Chlorhexidine Gluconate Cloth 2 % PADS 6 each  6 each Topical Q0600 Reesa Chew, MD      . cloNIDine (CATAPRES) tablet 0.1 mg  0.1 mg Oral TID Reesa Chew, MD   0.1 mg at 09/21/19 2245  . ferumoxytol (FERAHEME) 510 mg in sodium chloride 0.9 % 100 mL IVPB  510 mg Intravenous Once Reesa Chew, MD      . furosemide (LASIX) 120 mg in dextrose 5 % 50 mL IVPB  120 mg Intravenous Q8H Reesa Chew, MD      . heparin ADULT infusion 100 units/mL (25000 units/247mL sodium chloride 0.45%)  1,200 Units/hr Intravenous Continuous Lavenia Atlas, RPH 12 mL/hr at 09/22/19 0206 1,200 Units/hr at 09/22/19 0206  . hydrALAZINE (APRESOLINE) tablet 25 mg  25 mg Oral Q8H Spongberg, Audie Pinto, MD      . nebivolol (BYSTOLIC) tablet 10 mg  10 mg Oral QHS Jennette Kettle M, DO   10 mg at 09/21/19 2023  . ondansetron (ZOFRAN) tablet 4 mg  4 mg Oral Q6H PRN Etta Quill, DO       Or  . ondansetron [facility]) injection 4 mg  4 mg Intravenous Q6H PRN Etta Quill, DO      . tamsulosin [facility]) capsule 0.4 mg  0.4 mg Oral QHS Jennette Kettle M, DO   0.4 mg at 09/21/19 2245       Review of Systems: 10 systems reviewed and negative except per interval history/subjective  Physical Exam: Vitals:   09/22/19 0403 09/22/19 0752  BP: (!) 175/78 (!) 178/74  Pulse: 61 65  Resp: 20 19  Temp: 98.5 F (36.9 C) 98.7 F (37.1 C)  SpO2:  99%   No intake/output data recorded.  Intake/Output Summary (Last 24 hours) at 09/22/2019 0908 Last data filed at 09/22/2019 0600 Gross per 24 hour  Intake 360 ml  Output 2100 ml  Net -1740 ml   General: Chronically ill-appearing, no distress HEENT: anicteric sclera, MMM CV: normal rate, no murmurs, 1+ pitting edema in bilateral lower extremities Lungs: bilateral chest rise, normal wob Abd: soft, non-tender, non-distended Psych: alert, engaged, appropriate mood and affect Neuro: normal speech, no gross focal deficits  Access: Left upper extremity AVG with good bruit and thrill   Test Results I personally reviewed new and old clinical labs and radiology tests Lab Results  Component Value Date   NA 139 09/22/2019   K 4.5 09/22/2019   CL 111 09/22/2019   CO2 20 (L) 09/22/2019   BUN 55 (H) 09/22/2019   CREATININE 5.47 (H) 09/22/2019   CALCIUM 8.0 (L) 09/22/2019   ALBUMIN 3.0 (L) 09/20/2019   PHOS 5.4 (H) 09/21/2019

## 2019-09-23 DIAGNOSIS — E669 Obesity, unspecified: Secondary | ICD-10-CM

## 2019-09-23 LAB — CBC
HCT: 24.2 % — ABNORMAL LOW (ref 39.0–52.0)
Hemoglobin: 7.7 g/dL — ABNORMAL LOW (ref 13.0–17.0)
MCH: 28.4 pg (ref 26.0–34.0)
MCHC: 31.8 g/dL (ref 30.0–36.0)
MCV: 89.3 fL (ref 80.0–100.0)
Platelets: 159 10*3/uL (ref 150–400)
RBC: 2.71 MIL/uL — ABNORMAL LOW (ref 4.22–5.81)
RDW: 13.1 % (ref 11.5–15.5)
WBC: 5.6 10*3/uL (ref 4.0–10.5)
nRBC: 0 % (ref 0.0–0.2)

## 2019-09-23 LAB — BASIC METABOLIC PANEL
Anion gap: 11 (ref 5–15)
BUN: 31 mg/dL — ABNORMAL HIGH (ref 8–23)
CO2: 25 mmol/L (ref 22–32)
Calcium: 8.3 mg/dL — ABNORMAL LOW (ref 8.9–10.3)
Chloride: 105 mmol/L (ref 98–111)
Creatinine, Ser: 4.74 mg/dL — ABNORMAL HIGH (ref 0.61–1.24)
GFR calc Af Amer: 13 mL/min — ABNORMAL LOW (ref >60)
GFR calc non Af Amer: 11 mL/min — ABNORMAL LOW (ref >60)
Glucose, Bld: 93 mg/dL (ref 70–99)
Potassium: 3.9 mmol/L (ref 3.5–5.1)
Sodium: 141 mmol/L (ref 135–145)

## 2019-09-23 LAB — GLUCOSE, CAPILLARY
Glucose-Capillary: 88 mg/dL (ref 70–99)
Glucose-Capillary: 92 mg/dL (ref 70–99)
Glucose-Capillary: 96 mg/dL (ref 70–99)

## 2019-09-23 LAB — HEPARIN LEVEL (UNFRACTIONATED): Heparin Unfractionated: 0.72 IU/mL — ABNORMAL HIGH (ref 0.30–0.70)

## 2019-09-23 MED ORDER — APIXABAN 5 MG PO TABS
5.0000 mg | ORAL_TABLET | Freq: Two times a day (BID) | ORAL | Status: DC
  Administered 2019-09-23 – 2019-09-24 (×2): 5 mg via ORAL
  Filled 2019-09-23 (×3): qty 1

## 2019-09-23 MED ORDER — DARBEPOETIN ALFA 40 MCG/0.4ML IJ SOSY
PREFILLED_SYRINGE | INTRAMUSCULAR | Status: AC
  Filled 2019-09-23: qty 0.4

## 2019-09-23 NOTE — Plan of Care (Signed)

## 2019-09-23 NOTE — Progress Notes (Signed)
PROGRESS NOTE  Duane Ortiz KDT:[id number] DOB: February 21, 1950 DOA: 09/20/2019 PCP: Sandi Mariscal, MD  HPI/Recap of past 32 hours: 69 year old male with past medical history significant for stage V chronic kidney disease in the setting of hypertensive nephropathy as well as a history of diabetes mellitus on chronic anticoagulation for coagulation disorder causing previous PE who presented to the emergency room on 9/17 evening sent over from nephrologist office with potassium of 6.  Patient had been prepping for transition to end-stage renal disease and starting dialysis.  In the last month, he had an AV graft placed complicated with partial stenosis which led to vascular surgery placing stent. Brought into the hospitalist service.   Assessment/Plan: Principal Problem:   ESRD (end stage renal disease) ([place]): Started on dialysis on 9/20.  Tolerating well.  Social work working on confirming outpatient dialysis bed which should be available by 9/21 Active Problems:   Essential hypertension: Noted elevated blood pressures today, should improve with dialysis.    Type 2 diabetes mellitus with renal complication ([place]): CBGs actually been stable during hospitalization.  He notes good control.  We will check an A1c for official records.    Coagulation defect ([place]) on chronic anticoagulation for history of PE: We will discuss with pharmacy about changing anticoagulation agents allergies end-stage renal disease.    Hyperkalemia, diminished renal excretion: Patient given Lokelma.  Started on dialysis on 9/20. Obesity: Patient meets criteria BMI greater than 30  Code Status: Full code  Family Communication: Declined for me to call family  Disposition Plan: Plan for discharge home tomorrow once outpatient dialysis confirmed   Consultants:  Nephrology  Procedures:  Dialysis done 9/20  Antimicrobials:  None  DVT prophylaxis: Heparin   Objective: Vitals:   09/23/19 1430 09/23/19 1500  BP:  (!) 195/85 (!) 190/87  Pulse: 64 61  Resp:    Temp:    SpO2:      Intake/Output Summary (Last 24 hours) at 09/23/2019 1517 Last data filed at 09/23/2019 1200 Gross per 24 hour  Intake 759.25 ml  Output 1500 ml  Net -740.75 ml   Filed Weights   09/22/19 1145 09/23/19 0600 09/23/19 1350  Weight: 91 kg 88 kg 90 kg   Body mass index is 31.08 kg/m.  Exam:   General: Alert and oriented x3, no acute distress  Cardiovascular: Regular rate and rhythm, S1-S2  Respiratory: Clear to auscultation bilaterally  Abdomen: Soft, nontender, nondistended, positive bowel sounds  Musculoskeletal: No clubbing or cyanosis, 1+ pitting edema bilaterally  Skin: Noted fistula in right upper extremity  Psychiatry: Appropriate, no evidence of psychoses   Data Reviewed: CBC: Recent Labs  Lab 09/20/19 1353 09/20/19 1400 09/21/19 0238 09/22/19 0814 09/23/19 0316  WBC 5.6  --  5.6 5.6 5.6  NEUTROABS 3.5  --   --   --   --   HGB 7.5* 7.8* 8.2* 7.1* 7.7*  HCT 25.0* 23.0* 27.7* 22.8* 24.2*  MCV 92.3  --  91.7 88.7 89.3  PLT 176  --  200 151 998   Basic Metabolic Panel: Recent Labs  Lab 09/20/19 1353 09/20/19 1400 09/21/19 0238 09/21/19 1045 09/22/19 0814 09/23/19 0316  NA 138 141 140  --  139 141  K 6.0* 5.9* 6.0*  --  4.5 3.9  CL 112* 116* 113*  --  111 105  CO2 16*  --  17*  --  20* 25  GLUCOSE 126* 115* 89  --  107* 93  BUN 91* 92* 89*  --  55* 31*  CREATININE 7.04* 7.40* 6.95*  --  5.47* 4.74*  CALCIUM 8.1*  --  8.5*  --  8.0* 8.3*  PHOS  --   --   --  5.4*  --   --    GFR: Estimated Creatinine Clearance: 15.3 mL/min (A) (by C-G formula based on SCr of 4.74 mg/dL (H)). Liver Function Tests: Recent Labs  Lab 09/20/19 1353  AST 61*  ALT 88*  ALKPHOS 63  BILITOT 0.3  PROT 5.9*  ALBUMIN 3.0*   No results for input(s): LIPASE, AMYLASE in the last 168 hours. No results for input(s): AMMONIA in the last 168 hours. Coagulation Profile: No results for input(s): INR,  PROTIME in the last 168 hours. Cardiac Enzymes: No results for input(s): CKTOTAL, CKMB, CKMBINDEX, TROPONINI in the last 168 hours. BNP (last 3 results) No results for input(s): PROBNP in the last 8760 hours. HbA1C: No results for input(s): HGBA1C in the last 72 hours. CBG: Recent Labs  Lab 09/22/19 0754 09/22/19 1638 09/22/19 2110 09/23/19 0740 09/23/19 1201  GLUCAP 110* 106* 94 88 92   Lipid Profile: No results for input(s): CHOL, HDL, LDLCALC, TRIG, CHOLHDL, LDLDIRECT in the last 72 hours. Thyroid Function Tests: No results for input(s): TSH, T4TOTAL, FREET4, T3FREE, THYROIDAB in the last 72 hours. Anemia Panel: Recent Labs    09/21/19 1045  FERRITIN 134  TIBC 235*  IRON 48   Urine analysis:    Component Value Date/Time   COLORURINE STRAW (A) 05/28/2018 1010   APPEARANCEUR CLEAR 05/28/2018 1010   LABSPEC 1.010 05/28/2018 1010   PHURINE 6.0 05/28/2018 1010   GLUCOSEU NEGATIVE 05/28/2018 1010   HGBUR NEGATIVE 05/28/2018 [place] 05/28/2018 1010   KETONESUR NEGATIVE 05/28/2018 1010   PROTEINUR 100 (A) 05/28/2018 1010   NITRITE NEGATIVE 05/28/2018 1010   LEUKOCYTESUR NEGATIVE 05/28/2018 1010   Sepsis Labs: @LABRCNTIP (procalcitonin:4,lacticidven:4)  ) Recent Results (from the past 240 hour(s))  SARS CORONAVIRUS 2 (TAT 6-24 HRS) Nasopharyngeal Nasopharyngeal Swab     Status: None   Collection Time: 09/14/19  1:55 PM   Specimen: Nasopharyngeal Swab  Result Value Ref Range Status   SARS Coronavirus 2 NEGATIVE NEGATIVE Final    Comment: (NOTE) SARS-CoV-2 target nucleic acids are NOT DETECTED.  The SARS-CoV-2 RNA is generally detectable in upper and lower respiratory specimens during the acute phase of infection. Negative results do not preclude SARS-CoV-2 infection, do not rule out co-infections with other pathogens, and should not be used as the sole basis for treatment or other patient management decisions. Negative results must be combined  with clinical observations, patient history, and epidemiological information. The expected result is Negative.  Fact Sheet for Patients: SugarRoll.be  Fact Sheet for Healthcare Providers: https://www.woods-mathews.com/  This test is not yet approved or cleared by the [place] FDA and  has been authorized for detection and/or diagnosis of SARS-CoV-2 by FDA under an Emergency Use Authorization (EUA). This EUA will remain  in effect (meaning this test can be used) for the duration of the COVID-19 declaration under Se ction 564(b)(1) of the Act, 21 U.S.C. section 360bbb-3(b)(1), unless the authorization is terminated or revoked sooner.  Performed at [facility], [place] [address] [place], [place] [address]   SARS Coronavirus 2 by RT PCR (hospital order, performed in [facility] hospital lab) Nasopharyngeal Nasopharyngeal Swab     Status: None   Collection Time: 09/21/19  2:38 AM   Specimen: Nasopharyngeal Swab  Result Value Ref Range Status  SARS Coronavirus 2 NEGATIVE NEGATIVE Final    Comment: (NOTE) SARS-CoV-2 target nucleic acids are NOT DETECTED.  The SARS-CoV-2 RNA is generally detectable in upper and lower respiratory specimens during the acute phase of infection. The lowest concentration of SARS-CoV-2 viral copies this assay can detect is 250 copies / mL. A negative result does not preclude SARS-CoV-2 infection and should not be used as the sole basis for treatment or other patient management decisions.  A negative result may occur with improper specimen collection / handling, submission of specimen other than nasopharyngeal swab, presence of viral mutation(s) within the areas targeted by this assay, and inadequate number of viral copies (<250 copies / mL). A negative result must be combined with clinical observations, patient history, and epidemiological information.  Fact Sheet for Patients:     StrictlyIdeas.no  Fact Sheet for Healthcare Providers: BankingDealers.co.za  This test is not yet approved or  cleared by the [place] FDA and has been authorized for detection and/or diagnosis of SARS-CoV-2 by FDA under an Emergency Use Authorization (EUA).  This EUA will remain in effect (meaning this test can be used) for the duration of the COVID-19 declaration under Section 564(b)(1) of the Act, 21 U.S.C. section 360bbb-3(b)(1), unless the authorization is terminated or revoked sooner.  Performed at [facility], [place] [address], [place], [place] [address]       Studies: No results found.  Scheduled Meds: . amLODipine  5 mg Oral Daily  . Chlorhexidine Gluconate Cloth  6 each Topical Q0600  . cloNIDine  0.1 mg Oral TID  . darbepoetin (ARANESP) injection - DIALYSIS  40 mcg Intravenous Q Mon-HD  . hydrALAZINE  25 mg Oral Q8H  . nebivolol  10 mg Oral QHS  . tamsulosin  0.4 mg Oral QHS    Continuous Infusions: . heparin 1,150 Units/hr (09/23/19 1230)     LOS: 1 day     Annita Brod, MD [facility]   09/23/2019, 3:17 PM

## 2019-09-23 NOTE — Progress Notes (Signed)
Renal Navigator notes patient's need for OP HD in Nephrology note from weekend and met with patient to discuss. Patient states his Nephrologist is in [facility], where he has gone for 4 years, but now that he needs HD, he does not want to go to [facility] for treatment because he lives on [address] in [place]. He is requesting [address], however, Navigator explained that there is only one clinic in [place] currently accepting new admissions, which is [place] on [address]. Patient reports that he drives and this will be fine, as he understands from Navigator that he can request to be transferred to a clinic closer to his home when a seat becomes available. Navigator informed him that [facility] (on [address]) is very nearby, as is [facility]. He stated understanding. He reports that his car is "currently in the shop," so Navigator suggests applying for [facility] [place]. Patient agrees. He is very appreciative of assistance in arranging HD closer to home than [facility]. He states treatments have been going well here in the hospital.  Navigator has completed referral to [facility] to request treatment for ESRD at [facility] clinic and will follow closely. Navigator will assist with [facility] [place] application.  Alphonzo Cruise, [place] Renal Navigator [phone]

## 2019-09-23 NOTE — Progress Notes (Signed)
ANTICOAGULATION CONSULT NOTE - Follow Up Consult  Pharmacy Consult for heparin Indication: h/o VTE w/ coagulation disorder  Allergies  Allergen Reactions  . Gabapentin     Felt like I was out of my head, slept all day, confusion   . Sulfa Antibiotics Itching    Patient Measurements: Height: 5\' 7"  (170.2 cm) Weight: 88 kg (194 lb 1.6 oz) IBW/kg (Calculated) : 66.1 Heparin Dosing Weight: 95kg  Vital Signs: Temp: 98.6 F (37 C) (09/20 0422) Temp Source: Oral (09/20 0422) BP: 175/76 (09/20 0422) Pulse Rate: 68 (09/19 2002)  Labs: Recent Labs    09/21/19 0238 09/21/19 0238 09/21/19 1045 09/21/19 1045 09/21/19 2055 09/22/19 0814 09/23/19 0316  HGB 8.2*   < >  --   --   --  7.1* 7.7*  HCT 27.7*  --   --   --   --  22.8* 24.2*  PLT 200  --   --   --   --  151 159  APTT  --   --  164*  --  192*  --   --   HEPARINUNFRC  --   --  0.72*   < > 0.44 0.63 0.72*  CREATININE 6.95*  --   --   --   --  5.47* 4.74*   < > = values in this interval not displayed.    Estimated Creatinine Clearance: 15.1 mL/min (A) (by C-G formula based on SCr of 4.74 mg/dL (H)).   Medical History: Past Medical History:  Diagnosis Date  . Arthritis   . Chronic kidney disease   . Chronic kidney disease   . Diabetes ([place]) 02/012017  . Fainting   . Gait abnormality 10/11/2016  . GERD (gastroesophageal reflux disease)   . Gout   . Headache   . Hypercholesteremia   . Hypercholesterolemia   . Hypertension   . Peripheral neuropathy 04/02/2019  . Pulmonary embolism ([place])   . Shortness of breath   . Sleep apnea    does wear cpap  . Wears dentures     Assessment: 69yo male sent to ED for HD though has not yet used new HD access, to begin heparin bridge given concern for possible procedure; pt reports that Xarelto has been held since last week.  Heparin level slightly above goal today, H/H stable.  Goal of Therapy:  Heparin Level 0.3-0.7 units/ml Monitor platelets by anticoagulation protocol:  Yes   Plan:  Reduce heparin to 1150 units/h Recheck heparin level with daily labs  Arrie Senate, PharmD, BCPS Clinical Pharmacist [phone] Please check AMION for all [place] numbers 09/23/2019

## 2019-09-23 NOTE — Progress Notes (Signed)
ANTICOAGULATION CONSULT NOTE - Follow Up Consult  Pharmacy Consult for Heparin Indication: h/o VTE w/ coagulation disorder  Allergies  Allergen Reactions  . Gabapentin     Felt like I was out of my head, slept all day, confusion   . Sulfa Antibiotics Itching    Patient Measurements: Height: 5\' 7"  (170.2 cm) Weight: 90 kg (198 lb 6.6 oz) IBW/kg (Calculated) : 66.1 Heparin Dosing Weight: 95 kg  Vital Signs: Temp: 98.5 F (36.9 C) (09/20 1357) Temp Source: Oral (09/20 1357) BP: 160/77 (09/20 1700) Pulse Rate: 70 (09/20 1700)  Labs: Recent Labs    09/21/19 0238 09/21/19 0238 09/21/19 1045 09/21/19 1045 09/21/19 2055 09/22/19 0814 09/23/19 0316  HGB 8.2*   < >  --   --   --  7.1* 7.7*  HCT 27.7*  --   --   --   --  22.8* 24.2*  PLT 200  --   --   --   --  151 159  APTT  --   --  164*  --  192*  --   --   HEPARINUNFRC  --   --  0.72*   < > 0.44 0.63 0.72*  CREATININE 6.95*  --   --   --   --  5.47* 4.74*   < > = values in this interval not displayed.    Estimated Creatinine Clearance: 15.3 mL/min (A) (by C-G formula based on SCr of 4.74 mg/dL (H)).   Medical History: Past Medical History:  Diagnosis Date  . Arthritis   . Chronic kidney disease   . Chronic kidney disease   . Diabetes ([place]) 02/012017  . Fainting   . Gait abnormality 10/11/2016  . GERD (gastroesophageal reflux disease)   . Gout   . Headache   . Hypercholesteremia   . Hypercholesterolemia   . Hypertension   . Peripheral neuropathy 04/02/2019  . Pulmonary embolism ([place])   . Shortness of breath   . Sleep apnea    does wear cpap  . Wears dentures     Assessment: 69 yr old male sent to ED for HD, though had not yet used new HD access, to begin heparin bridge given possible procedure. Pt was on Xarelto PTA and reports that Xarelto has been held since last week.  Pt is currently receiving IV heparin at 1150 units/hr (last heparin level was 0.72 units/ml on heparin infusion at 1200 units/hr) .  Pharmacy has been consulted to transition pt to apixaban.  H/H 7.7/24.2, platelets 159 (CBC stable); 69 yrs old, 90 kg, Scr 4.74. Per RN, no bleeding issues observed.  Goal of Therapy:  Heparin level 0.3-0.7 units/ml Monitor platelets by anticoagulation protocol: Yes   Plan:  Discontinue heparin infusion at 2000 PM this evening and start apixaban 5 mg po BID at that time Monitor daily CBC Monitor for signs/symptoms of bleeding  Gillermina Hu, PharmD, BCPS, [facility] Clinical Pharmacist 09/23/2019

## 2019-09-23 NOTE — Progress Notes (Signed)
Patient ID: Duane Ortiz, male   DOB: Dec 08, 1950, 69 y.o.   MRN: [id number]  [facility] Progress Note    Subjective:    Feels well, no complaints   Objective:   BP (!) 181/80 (BP Location: Right Arm)   Pulse 65   Temp 98.6 F (37 C) (Oral)   Resp 17   Ht 5\' 7"  (1.702 m)   Wt 88 kg   SpO2 98%   BMI 30.40 kg/m   Intake/Output: I/O last 3 completed shifts: In: 1319.3 [P.O.:1000; I.V.:257.3; IV Piggyback:62] Out: 3900 [Urine:2400; Other:1500]   Intake/Output this shift:  No intake/output data recorded. Weight change: 0.928 kg  Physical Exam: Gen: NAD CVS: RRR, no rub Resp: cta QPY:PPJKDT Ext: trace pretibial edema, LUE AVG +T/B  Labs: BMET Recent Labs  Lab 09/20/19 1353 09/20/19 1400 09/21/19 0238 09/21/19 1045 09/22/19 0814 09/23/19 0316  NA 138 141 140  --  139 141  K 6.0* 5.9* 6.0*  --  4.5 3.9  CL 112* 116* 113*  --  111 105  CO2 16*  --  17*  --  20* 25  GLUCOSE 126* 115* 89  --  107* 93  BUN 91* 92* 89*  --  55* 31*  CREATININE 7.04* 7.40* 6.95*  --  5.47* 4.74*  ALBUMIN 3.0*  --   --   --   --   --   CALCIUM 8.1*  --  8.5*  --  8.0* 8.3*  PHOS  --   --   --  5.4*  --   --    CBC Recent Labs  Lab 09/20/19 1353 09/20/19 1353 09/20/19 1400 09/21/19 0238 09/22/19 0814 09/23/19 0316  WBC 5.6  --   --  5.6 5.6 5.6  NEUTROABS 3.5  --   --   --   --   --   HGB 7.5*   < > 7.8* 8.2* 7.1* 7.7*  HCT 25.0*   < > 23.0* 27.7* 22.8* 24.2*  MCV 92.3  --   --  91.7 88.7 89.3  PLT 176  --   --  200 151 159   < > = values in this interval not displayed.      Medications:    . amLODipine  5 mg Oral Daily  . Chlorhexidine Gluconate Cloth  6 each Topical Q0600  . cloNIDine  0.1 mg Oral TID  . darbepoetin (ARANESP) injection - DIALYSIS  40 mcg Intravenous Q Mon-HD  . hydrALAZINE  25 mg Oral Q8H  . nebivolol  10 mg Oral QHS  . tamsulosin  0.4 mg Oral QHS     Assessment/ Plan:   1. Volume overload- markedly improved with HD and  UF. 2. ESRD s/p 2 HD sessions and due for his 3rd today.  Tolerating it well and feels better. 3. Anemia of CKD stage V- s/p IV feraheme and started on aranesp.  4. CKD-MBD:stable 5. Nutrition: renal diet 6. Hypertension: UF as tolerated 7. Hyperkalemia- resolved with HD 8. Disposition- awaiting outpatient HD to be arranged.  Donetta Potts, MD [place] Pager [phone] 09/23/2019, 11:56 AM

## 2019-09-24 LAB — CBC
HCT: 25.6 % — ABNORMAL LOW (ref 39.0–52.0)
Hemoglobin: 8 g/dL — ABNORMAL LOW (ref 13.0–17.0)
MCH: 28.4 pg (ref 26.0–34.0)
MCHC: 31.3 g/dL (ref 30.0–36.0)
MCV: 90.8 fL (ref 80.0–100.0)
Platelets: 138 10*3/uL — ABNORMAL LOW (ref 150–400)
RBC: 2.82 MIL/uL — ABNORMAL LOW (ref 4.22–5.81)
RDW: 12.7 % (ref 11.5–15.5)
WBC: 6.9 10*3/uL (ref 4.0–10.5)
nRBC: 0 % (ref 0.0–0.2)

## 2019-09-24 LAB — PTH, INTACT AND CALCIUM
Calcium, Total (PTH): 8.1 mg/dL — ABNORMAL LOW (ref 8.6–10.2)
PTH: 357 pg/mL — ABNORMAL HIGH (ref 15–65)

## 2019-09-24 LAB — GLUCOSE, CAPILLARY
Glucose-Capillary: 116 mg/dL — ABNORMAL HIGH (ref 70–99)
Glucose-Capillary: 96 mg/dL (ref 70–99)

## 2019-09-24 LAB — BASIC METABOLIC PANEL
Anion gap: 12 (ref 5–15)
BUN: 15 mg/dL (ref 8–23)
CO2: 26 mmol/L (ref 22–32)
Calcium: 8.4 mg/dL — ABNORMAL LOW (ref 8.9–10.3)
Chloride: 99 mmol/L (ref 98–111)
Creatinine, Ser: 3.81 mg/dL — ABNORMAL HIGH (ref 0.61–1.24)
GFR calc Af Amer: 17 mL/min — ABNORMAL LOW (ref >60)
GFR calc non Af Amer: 15 mL/min — ABNORMAL LOW (ref >60)
Glucose, Bld: 86 mg/dL (ref 70–99)
Potassium: 3.8 mmol/L (ref 3.5–5.1)
Sodium: 137 mmol/L (ref 135–145)

## 2019-09-24 MED ORDER — AMLODIPINE BESYLATE 5 MG PO TABS
5.0000 mg | ORAL_TABLET | Freq: Every day | ORAL | 1 refills | Status: DC
Start: 2019-09-25 — End: 2022-10-31

## 2019-09-24 MED ORDER — CLONIDINE HCL 0.1 MG PO TABS
0.1000 mg | ORAL_TABLET | Freq: Three times a day (TID) | ORAL | 11 refills | Status: DC
Start: 2019-09-24 — End: 2019-11-01

## 2019-09-24 MED ORDER — APIXABAN 5 MG PO TABS
5.0000 mg | ORAL_TABLET | Freq: Two times a day (BID) | ORAL | 1 refills | Status: DC
Start: 2019-09-24 — End: 2022-10-31

## 2019-09-24 MED ORDER — LIDOCAINE-PRILOCAINE 2.5-2.5 % EX CREA
TOPICAL_CREAM | CUTANEOUS | Status: DC | PRN
  Filled 2019-09-24: qty 5

## 2019-09-24 MED ORDER — HYDRALAZINE HCL 25 MG PO TABS
25.0000 mg | ORAL_TABLET | Freq: Three times a day (TID) | ORAL | 1 refills | Status: DC
Start: 2019-09-24 — End: 2023-03-22

## 2019-09-24 MED ORDER — KIDNEY FAILURE BOOK
Freq: Once | Status: AC
  Filled 2019-09-24: qty 1

## 2019-09-24 NOTE — Progress Notes (Signed)
[facility] [place] application completed with patient and submitted.   Alphonzo Cruise, [place] Renal Navigator [phone]

## 2019-09-24 NOTE — Progress Notes (Signed)
Nutrition Education Note  RD consulted for renal diabetes diet education. Provided Renal pyramid handout  to patient. Reviewed food groups and provided written recommended serving sizes specifically determined for patient's current nutritional status.   Explained why diet restrictions are needed and provided lists of foods to limit/avoid that are high potassium, sodium, and phosphorus. Provided specific recommendations on safer alternatives of these foods. Strongly encouraged compliance of this diet.   Discussed importance of protein intake at each meal and snack. Provided examples of how to maximize protein intake throughout the day. Discussed need for fluid restriction with dialysis, importance of minimizing weight gain between HD treatments, and renal-friendly beverage options.  Encouraged pt to discuss specific diet questions/concerns with RD at HD outpatient facility. Teach back method used.  Expect good compliance.  Body mass index is 29.71 kg/m. Pt meets criteria for overweight based on current BMI.  Current diet order is Renal CHO modified, patient is consuming approximately 100% of meals at this time. Labs and medications reviewed. No further nutrition interventions warranted at this time. RD contact information provided. If additional nutrition issues arise, please re-consult RD.  Duane Ortiz, RD, LDN, CNSC Please refer to [facility] for contact information.

## 2019-09-24 NOTE — Discharge Summary (Signed)
Discharge Summary  Duane Ortiz HGD:[id number] DOB: [birth year]  PCP: Sandi Mariscal, MD  Admit date: 09/20/2019 Discharge date: 09/24/2019  Time spent: 25 minutes  Recommendations for Outpatient Follow-up:  1. Patient is starting dialysis at [facility] starting Thursday, 9/23 2. New medication: Hydralazine 25 mg 3 times daily 3. New medication: Norvasc 5 mg p.o. daily 4. Medication change: Clonidine changed from 0.1 mg daily to 3 times daily 5. Medication change: Patient previously on Xarelto which has been discontinued. 6. New medication: Eliquis 5 mg p.o. twice daily  Discharge Diagnoses:  Active Hospital Problems   Diagnosis Date Noted  . ESRD (end stage renal disease) ([place]) 09/21/2019  . Hyperkalemia, diminished renal excretion 09/21/2019  . Coagulation defect ([place]) 07/12/2018  . Obesity (BMI 30-39.9) 02/13/2018  . Essential hypertension 04/27/2017  . Type 2 diabetes mellitus with renal complication ([place]) 96/22/2979    Resolved Hospital Problems  No resolved problems to display.    Discharge Condition: Improved, being discharged home  Diet recommendation: Heart healthy renal diet  Vitals:   09/24/19 0542 09/24/19 0829  BP: (!) 161/77 139/65  Pulse:  62  Resp:  18  Temp: 98.3 F (36.8 C) 98.7 F (37.1 C)  SpO2:  95%    History of present illness:  69 year old male with past medical history significant for stage V chronic kidney disease in the setting of hypertensive nephropathy as well as a history of diabetes mellitus on chronic anticoagulation for coagulation disorder causing previous PE who presented to the emergency room on 9/17 evening sent over from nephrologist office with potassium of 6.  Patient had been prepping for transition to end-stage renal disease and starting dialysis.  In the last month, he had an AV graft placed complicated with partial stenosis which led to vascular surgery placing stent. Brought into the hospitalist service.  Hospital Course:     ESRD (end stage renal disease) ([place]): Started on dialysis on 9/20, which she tolerated well.  Social work set patient up with outpatient dialysis which was started to [facility] facility on Thursday, 9/23. Active Problems:   Essential hypertension: Patient with persistently elevated blood pressures, even after dialysis.  His blood pressure medications have been adjusted increasing his clonidine from daily to 3 times daily and adding 3 times daily hydralazine and Norvasc.    Type 2 diabetes mellitus with renal complication ([place]): CBGs actually been stable during hospitalization.  He notes good control.  .    Coagulation defect ([place]) on chronic anticoagulation for history of PE: Was on Xarelto at home.  Not a good medicine now that he is end-stage renal disease on hemodialysis.  Changed over to Eliquis.    Hyperkalemia, diminished renal excretion: Patient given Lokelma.  Started on dialysis on 9/20.  Potassium on day of discharge at 3.8  Overweight: Patient meets criteria BMI greater than 25.  Previously was in obese category, but with excess fluid removed during dialysis, his BMI is now below 30  Procedures:  Dialysis done 9/20  Consultations:  Nephrology  Discharge Exam: BP 139/65 (BP Location: Right Arm)   Pulse 62   Temp 98.7 F (37.1 C) (Oral)   Resp 18   Ht 5\' 7"  (1.702 m)   Wt 86 kg   SpO2 95%   BMI 29.71 kg/m   General: Alert and oriented x3, no acute distress Cardiovascular: Regular rate and rhythm, S1-S2 Respiratory: Clear to auscultation bilaterally  Discharge Instructions You were cared for by a hospitalist during your hospital stay. If you have  any questions about your discharge medications or the care you received while you were in the hospital after you are discharged, you can call the unit and asked to speak with the hospitalist on call if the hospitalist that took care of you is not available. Once you are discharged, your primary care physician will handle any  further medical issues. Please note that NO REFILLS for any discharge medications will be authorized once you are discharged, as it is imperative that you return to your primary care physician (or establish a relationship with a primary care physician if you do not have one) for your aftercare needs so that they can reassess your need for medications and monitor your lab values.  Discharge Instructions    Diet - low sodium heart healthy   Complete by: As directed    Increase activity slowly   Complete by: As directed      Allergies as of 09/24/2019      Reactions   Gabapentin    Felt like I was out of my head, slept all day, confusion    Sulfa Antibiotics Itching      Medication List    TAKE these medications   allopurinol 100 MG tablet Commonly known as: ZYLOPRIM Take 100 mg by mouth daily.   amLODipine 5 MG tablet Commonly known as: NORVASC Take 1 tablet (5 mg total) by mouth daily. Start taking on: September 25, 2019   apixaban 5 MG Tabs tablet Commonly known as: ELIQUIS Take 1 tablet (5 mg total) by mouth 2 (two) times daily.   Auryxia 1 GM 210 MG(Fe) tablet Generic drug: ferric citrate Take 210 mg by mouth 3 (three) times daily with meals.   baclofen 10 MG tablet Commonly known as: LIORESAL Take 10 mg by mouth daily as needed for muscle spasms.   calcitRIOL 0.25 MCG capsule Commonly known as: ROCALTROL Take 0.25 mcg by mouth daily.   cloNIDine 0.1 MG tablet Commonly known as: CATAPRES Take 1 tablet (0.1 mg total) by mouth 3 (three) times daily. What changed: when to take this   ergocalciferol 1.25 MG (50000 UT) capsule Commonly known as: VITAMIN D2 Take 50,000 Units by mouth once a week.   hydrALAZINE 25 MG tablet Commonly known as: APRESOLINE Take 1 tablet (25 mg total) by mouth 3 (three) times daily.   nebivolol 10 MG tablet Commonly known as: BYSTOLIC Take 10 mg by mouth at bedtime.   oxyCODONE-acetaminophen 5-325 MG tablet Commonly known as:  Percocet Take 1 tablet by mouth every 4 (four) hours as needed for severe pain.   sodium bicarbonate 650 MG tablet Take 650 mg by mouth 2 (two) times daily.   tamsulosin 0.4 MG Caps capsule Commonly known as: FLOMAX Take 0.4 mg by mouth at bedtime.   VITAMIN E PO Take 1 capsule by mouth daily.      Allergies  Allergen Reactions  . Gabapentin     Felt like I was out of my head, slept all day, confusion   . Sulfa Antibiotics Itching      The results of significant diagnostics from this hospitalization (including imaging, microbiology, ancillary and laboratory) are listed below for reference.    Significant Diagnostic Studies: PERIPHERAL VASCULAR CATHETERIZATION  Result Date: 09/16/2019 Patient name: Mathieu Schloemer MRN: [id number] DOB: [birth year] Sex: male 09/16/2019 Pre-operative Diagnosis: esrd, swelling left upper extremity Post-operative diagnosis:  Same Surgeon:  Eda Paschal. Donzetta Matters, MD Procedure Performed: 1.  Ultrasound-guided cannulation left arm AV graft 2.  With the upper  extremity and central fistulogram 3.  Stent of graft venous anastomosis with 7 x 50 mm Viabahn Indications: 69 year old male with chronic kidney disease now considered in need of dialysis has swelling of his left upper extremity following left arm AV graft placement.  He is now indicated for fistulogram and possible intervention. Findings: The graft itself is patent.  There is an end to end anastomosis to the axillary vein.  There was approximately 60% stenosis at the outflow.  Central veins are patent.  After stent placement there is less than 20% residual stenosis.  Stent was balloon dilated to 7 mm and retrograde imaging demonstrated patent arterial anastomosis.  Procedure:  The patient was identified in the holding area and taken to room 8.  The patient was then placed supine on the table and prepped and draped in the usual sterile fashion.  A time out was called.  Ultrasound was used to evaluate the left arm AV  graft.  The area was anesthetized 1% lidocaine cannulated with micropuncture needle followed the wire sheath.  This was done with ultrasound guidance and an image was saved the permanent record.  We placed a micropuncture sheath performed fistulogram as well as central venography.  With the above findings we elected to primarily stent.  We placed the long J-wire placed a 7 French sheath.  We then crossed with an 014 wire.  We primarily placed a 7 x 50 mm Viabahn.  This was postdilated with 7 mm Sterling balloon.  While there was inflated we perform retrograde imaging which demonstrated patent arterial anastomosis.  Satisfied we allowed our balloon down after 1 minute of inflation.  Completion demonstrated less than 20% residual stenosis.  There was a very strong thrill.  The cannulation site was again anesthetized 1% lidocaine.  A 4-0 Monocryl suture was placed and sheath and wire were removed.  The patient tolerated procedure without any complication. Contrast: 20 cc Brandon C. Donzetta Matters, MD [facility] Office: [phone] Pager: [phone]  VAS US DUPLEX DIALYSIS ACCESS (AVF, AVG)  Result Date: 09/20/2019 DIALYSIS ACCESS Reason for Exam: Swelling, warmth of left upper extremity. Access Site: Left Upper Extremity. Access Type: Upper arm loop AVG. History: LUE A/V shuntogram 09/16/2019. Performing Technologist: Maudry Mayhew MHA, RDMS, RVT, RDCS  Examination Guidelines: A complete evaluation includes B-mode imaging, spectral Doppler, color Doppler, and power Doppler as needed of all accessible portions of each vessel. Unilateral testing is considered an integral part of a complete examination. Limited examinations for reoccurring indications may be performed as noted.  Findings:   +--------------------+----------+-----------------+--------+ AVG                 PSV (cm/s)Flow Vol (mL/min)Describe +--------------------+----------+-----------------+--------+ Native artery  inflow   193                              +--------------------+----------+-----------------+--------+ Arterial anastomosis   229                              +--------------------+----------+-----------------+--------+ Prox graft             395                              +--------------------+----------+-----------------+--------+ Mid graft              275                              +--------------------+----------+-----------------+--------+  Distal graft           344                              +--------------------+----------+-----------------+--------+ Venous anastomosis     280                              +--------------------+----------+-----------------+--------+ Venous outflow         287                              +--------------------+----------+-----------------+--------+  Summary: The arterial anastomosis exhibits visual narrowing by color Doppler without significantly elevated velocities; cannot exclude stenosis. Some perigraft flow is visualized; cannot exclude infection. *See table(s) above for measurements and observations.  Diagnosing physician: Servando Snare MD Electronically signed by Servando Snare MD on 09/20/2019 at 3:39:51 PM.   --------------------------------------------------------------------------------   Final    UE VENOUS DUPLEX (Lupus & WL 7 am - 7 pm)  Result Date: 09/20/2019 UPPER VENOUS STUDY  Indications: Swelling, and Pain Limitations: LUE AVG, poor ultrasound/tissue interface and body habitus. Comparison Study: No prior study Performing Technologist: Maudry Mayhew MHA, RDMS, RVT, RDCS  Examination Guidelines: A complete evaluation includes B-mode imaging, spectral Doppler, color Doppler, and power Doppler as needed of all accessible portions of each vessel. Bilateral testing is considered an integral part of a complete examination. Limited examinations for reoccurring indications may be performed as noted.  Left Findings:  +----------+------------+---------+-----------+----------+--------------+ LEFT      CompressiblePhasicitySpontaneousProperties   Summary     +----------+------------+---------+-----------+----------+--------------+ IJV           Full       Yes       Yes                             +----------+------------+---------+-----------+----------+--------------+ Subclavian    Full       Yes       Yes                             +----------+------------+---------+-----------+----------+--------------+ Axillary      Full       Yes       Yes                             +----------+------------+---------+-----------+----------+--------------+ Brachial      Full       Yes       Yes                             +----------+------------+---------+-----------+----------+--------------+ Radial        Full                                                 +----------+------------+---------+-----------+----------+--------------+ Ulnar         Full                                                 +----------+------------+---------+-----------+----------+--------------+  Cephalic      Full                                                 +----------+------------+---------+-----------+----------+--------------+ Basilic                                             Not visualized +----------+------------+---------+-----------+----------+--------------+  Summary:  Left: No evidence of deep vein thrombosis in the upper extremity. No evidence of superficial vein thrombosis involving the visualized veins of the upper extremity.  *See table(s) above for measurements and observations.  Diagnosing physician: Servando Snare MD Electronically signed by Servando Snare MD on 09/20/2019 at 3:39:58 PM.    Final     Microbiology: Recent Results (from the past 240 hour(s))  SARS CORONAVIRUS 2 (TAT 6-24 HRS) Nasopharyngeal Nasopharyngeal Swab     Status: None   Collection Time: 09/14/19  1:55 PM    Specimen: Nasopharyngeal Swab  Result Value Ref Range Status   SARS Coronavirus 2 NEGATIVE NEGATIVE Final    Comment: (NOTE) SARS-CoV-2 target nucleic acids are NOT DETECTED.  The SARS-CoV-2 RNA is generally detectable in upper and lower respiratory specimens during the acute phase of infection. Negative results do not preclude SARS-CoV-2 infection, do not rule out co-infections with other pathogens, and should not be used as the sole basis for treatment or other patient management decisions. Negative results must be combined with clinical observations, patient history, and epidemiological information. The expected result is Negative.  Fact Sheet for Patients: SugarRoll.be  Fact Sheet for Healthcare Providers: https://www.woods-mathews.com/  This test is not yet approved or cleared by the [place] FDA and  has been authorized for detection and/or diagnosis of SARS-CoV-2 by FDA under an Emergency Use Authorization (EUA). This EUA will remain  in effect (meaning this test can be used) for the duration of the COVID-19 declaration under Se ction 564(b)(1) of the Act, 21 U.S.C. section 360bbb-3(b)(1), unless the authorization is terminated or revoked sooner.  Performed at [facility], [place] [address], [place], [place] [address]   SARS Coronavirus 2 by RT PCR (hospital order, performed in [facility] hospital lab) Nasopharyngeal Nasopharyngeal Swab     Status: None   Collection Time: 09/21/19  2:38 AM   Specimen: Nasopharyngeal Swab  Result Value Ref Range Status   SARS Coronavirus 2 NEGATIVE NEGATIVE Final    Comment: (NOTE) SARS-CoV-2 target nucleic acids are NOT DETECTED.  The SARS-CoV-2 RNA is generally detectable in upper and lower respiratory specimens during the acute phase of infection. The lowest concentration of SARS-CoV-2 viral copies this assay can detect is 250 copies / mL. A negative result does not preclude  SARS-CoV-2 infection and should not be used as the sole basis for treatment or other patient management decisions.  A negative result may occur with improper specimen collection / handling, submission of specimen other than nasopharyngeal swab, presence of viral mutation(s) within the areas targeted by this assay, and inadequate number of viral copies (<250 copies / mL). A negative result must be combined with clinical observations, patient history, and epidemiological information.  Fact Sheet for Patients:   StrictlyIdeas.no  Fact Sheet for Healthcare Providers: BankingDealers.co.za  This test is not yet approved or  cleared by  the [employer] and has been authorized for detection and/or diagnosis of SARS-CoV-2 by FDA under an Emergency Use Authorization (EUA).  This EUA will remain in effect (meaning this test can be used) for the duration of the COVID-19 declaration under Section 564(b)(1) of the Act, 21 U.S.C. section 360bbb-3(b)(1), unless the authorization is terminated or revoked sooner.  Performed at [facility], [place] [address], [place], [place] [address]      Labs: Basic Metabolic Panel: Recent Labs  Lab 09/20/19 1353 09/20/19 1353 09/20/19 1400 09/21/19 0238 09/21/19 1045 09/22/19 0814 09/23/19 0316 09/24/19 0327  NA 138   < > 141 140  --  139 141 137  K 6.0*   < > 5.9* 6.0*  --  4.5 3.9 3.8  CL 112*   < > 116* 113*  --  111 105 99  CO2 16*  --   --  17*  --  20* 25 26  GLUCOSE 126*   < > 115* 89  --  107* 93 86  BUN 91*   < > 92* 89*  --  55* 31* 15  CREATININE 7.04*   < > 7.40* 6.95*  --  5.47* 4.74* 3.81*  CALCIUM 8.1*   < >  --  8.5* 8.1* 8.0* 8.3* 8.4*  PHOS  --   --   --   --  5.4*  --   --   --    < > = values in this interval not displayed.   Liver Function Tests: Recent Labs  Lab 09/20/19 1353  AST 61*  ALT 88*  ALKPHOS 63  BILITOT 0.3  PROT 5.9*  ALBUMIN 3.0*   No results for  input(s): LIPASE, AMYLASE in the last 168 hours. No results for input(s): AMMONIA in the last 168 hours. CBC: Recent Labs  Lab 09/20/19 1353 09/20/19 1353 09/20/19 1400 09/21/19 0238 09/22/19 0814 09/23/19 0316 09/24/19 0327  WBC 5.6  --   --  5.6 5.6 5.6 6.9  NEUTROABS 3.5  --   --   --   --   --   --   HGB 7.5*   < > 7.8* 8.2* 7.1* 7.7* 8.0*  HCT 25.0*   < > 23.0* 27.7* 22.8* 24.2* 25.6*  MCV 92.3  --   --  91.7 88.7 89.3 90.8  PLT 176  --   --  200 151 159 138*   < > = values in this interval not displayed.   Cardiac Enzymes: No results for input(s): CKTOTAL, CKMB, CKMBINDEX, TROPONINI in the last 168 hours. BNP: BNP (last 3 results) No results for input(s): BNP in the last 8760 hours.  ProBNP (last 3 results) No results for input(s): PROBNP in the last 8760 hours.  CBG: Recent Labs  Lab 09/22/19 2110 09/23/19 0740 09/23/19 1201 09/23/19 2145 09/24/19 0826  GLUCAP 94 88 92 96 116*       Signed:  Annita Brod, MD [facility] 09/24/2019, 11:49 AM

## 2019-09-24 NOTE — Progress Notes (Signed)
Patient has been accepted at [facility] on a TTS scheduled with a seat time of 6:10am. He needs to arrive to his appointments at 5:50am. Since his seat time is so early in the morning, we ask that he go to the clinic the day before in order to complete paperwork if possible. Navigator met with patient and discussed above over the phone with patient's son/Tyrone with patient's permission. Clinic information and schedule given verbally and in writing. Both patient and his son stated understanding and appreciation.  [facility] [place] should be in place for patient's first treatment Thursday, 09/26/19, but they need to call to schedule tomorrow. Patient's son agrees to take patient to the clinic tomorrow to complete intake paperwork between the hours of 8am-9pm.  Patient cleared for discharge today per Dr. Marval Regal for HD this week: Monday (inpt), Thusday, Saturday. Renal Navigator notified attending and bedside RN. Clinic notified and Renal NP asked to send OP HD orders.  Alphonzo Cruise, [place] Renal Navigator [phone]

## 2019-09-24 NOTE — Discharge Instructions (Signed)

## 2019-09-24 NOTE — Progress Notes (Signed)
Rounded on patient today in correlation to transition to outpatient HD. Ordered consult to dietician and Kidney Failure Book. Patient educated at the bedside regarding care of , AV fistula/graft site care, and proper medication administration on HD days.  Patient capable of verbalizing via teach back method. Educated patient on services are available to them through the interdisciplinary team in the clinic setting. Patient request lidocaine cream or spray for treatments. Will notify MD. Handouts and contact information provided to patient for any further assistance. Will follow as appropriate.   Dorthey Sawyer, RN  Dialysis Nurse Coordinator Phone: [phone]

## 2019-09-25 ENCOUNTER — Telehealth (HOSPITAL_COMMUNITY): Payer: Self-pay | Admitting: Nephrology

## 2019-09-25 NOTE — Telephone Encounter (Signed)
Transition of care contact from inpatient facility  Date of discharge: 09/24/2019 Date of contact:  09/25/2019 Method: Phone Spoke to: Patient  Patient contacted to discuss transition of care from recent inpatient hospitalization. Patient was admitted to [facility] from 9/17 - 09/24/2019 with discharge diagnosis of new ESRD, hyperkalemia, uncontrolled HTN, T2DM, pulmonary embolus.  Medication changes were reviewed. He has picked up the new BP meds as well as Eliquis.  Patient understands his outpatient HD schedule - will be TTS at 6:00am. He tells me he is on the way to the clinic now to sign paperwork in prep for tomorrow.  He has no concerns at this time.  Veneta Penton, PA-C [employer] Pager [phone]

## 2019-09-30 ENCOUNTER — Encounter (HOSPITAL_COMMUNITY): Payer: Self-pay | Admitting: Emergency Medicine

## 2019-09-30 ENCOUNTER — Emergency Department (HOSPITAL_COMMUNITY): Payer: Medicare Other

## 2019-09-30 ENCOUNTER — Inpatient Hospital Stay (HOSPITAL_COMMUNITY)
Admission: EM | Admit: 2019-09-30 | Discharge: 2019-10-03 | DRG: 091 | Disposition: A | Discharge status: Home Health | Payer: Medicare Other | Attending: Internal Medicine | Admitting: Internal Medicine

## 2019-09-30 DIAGNOSIS — I120 Hypertensive chronic kidney disease with stage 5 chronic kidney disease or end stage renal disease: Secondary | ICD-10-CM | POA: Diagnosis not present

## 2019-09-30 DIAGNOSIS — N2581 Secondary hyperparathyroidism of renal origin: Secondary | ICD-10-CM | POA: Diagnosis present

## 2019-09-30 DIAGNOSIS — E7800 Pure hypercholesterolemia, unspecified: Secondary | ICD-10-CM | POA: Diagnosis present

## 2019-09-30 DIAGNOSIS — G9340 Encephalopathy, unspecified: Secondary | ICD-10-CM | POA: Diagnosis present

## 2019-09-30 DIAGNOSIS — Z992 Dependence on renal dialysis: Secondary | ICD-10-CM | POA: Diagnosis not present

## 2019-09-30 DIAGNOSIS — R531 Weakness: Secondary | ICD-10-CM | POA: Diagnosis present

## 2019-09-30 DIAGNOSIS — G92 Toxic encephalopathy: Principal | ICD-10-CM | POA: Diagnosis present

## 2019-09-30 DIAGNOSIS — Z86711 Personal history of pulmonary embolism: Secondary | ICD-10-CM | POA: Diagnosis not present

## 2019-09-30 DIAGNOSIS — Z7901 Long term (current) use of anticoagulants: Secondary | ICD-10-CM | POA: Diagnosis not present

## 2019-09-30 DIAGNOSIS — N186 End stage renal disease: Secondary | ICD-10-CM | POA: Diagnosis not present

## 2019-09-30 DIAGNOSIS — F17210 Nicotine dependence, cigarettes, uncomplicated: Secondary | ICD-10-CM | POA: Diagnosis present

## 2019-09-30 DIAGNOSIS — M6281 Muscle weakness (generalized): Secondary | ICD-10-CM | POA: Diagnosis not present

## 2019-09-30 DIAGNOSIS — Z79899 Other long term (current) drug therapy: Secondary | ICD-10-CM | POA: Diagnosis not present

## 2019-09-30 DIAGNOSIS — E1122 Type 2 diabetes mellitus with diabetic chronic kidney disease: Secondary | ICD-10-CM | POA: Diagnosis present

## 2019-09-30 DIAGNOSIS — D689 Coagulation defect, unspecified: Secondary | ICD-10-CM | POA: Diagnosis present

## 2019-09-30 DIAGNOSIS — K219 Gastro-esophageal reflux disease without esophagitis: Secondary | ICD-10-CM | POA: Diagnosis not present

## 2019-09-30 DIAGNOSIS — T428X5A Adverse effect of antiparkinsonism drugs and other central muscle-tone depressants, initial encounter: Secondary | ICD-10-CM | POA: Diagnosis present

## 2019-09-30 DIAGNOSIS — I10 Essential (primary) hypertension: Secondary | ICD-10-CM | POA: Diagnosis present

## 2019-09-30 DIAGNOSIS — R791 Abnormal coagulation profile: Secondary | ICD-10-CM | POA: Diagnosis present

## 2019-09-30 DIAGNOSIS — Z20822 Contact with and (suspected) exposure to covid-19: Secondary | ICD-10-CM | POA: Diagnosis present

## 2019-09-30 DIAGNOSIS — D631 Anemia in chronic kidney disease: Secondary | ICD-10-CM | POA: Diagnosis present

## 2019-09-30 DIAGNOSIS — R4182 Altered mental status, unspecified: Secondary | ICD-10-CM | POA: Diagnosis present

## 2019-09-30 DIAGNOSIS — R410 Disorientation, unspecified: Secondary | ICD-10-CM | POA: Diagnosis not present

## 2019-09-30 HISTORY — DX: Encephalopathy, unspecified: G93.40

## 2019-09-30 LAB — I-STAT CHEM 8, ED
BUN: 47 mg/dL — ABNORMAL HIGH (ref 8–23)
Calcium, Ion: 0.97 mmol/L — ABNORMAL LOW (ref 1.15–1.40)
Chloride: 101 mmol/L (ref 98–111)
Creatinine, Ser: 9.9 mg/dL — ABNORMAL HIGH (ref 0.61–1.24)
Glucose, Bld: 87 mg/dL (ref 70–99)
HCT: 34 % — ABNORMAL LOW (ref 39.0–52.0)
Hemoglobin: 11.6 g/dL — ABNORMAL LOW (ref 13.0–17.0)
Potassium: 3.8 mmol/L (ref 3.5–5.1)
Sodium: 136 mmol/L (ref 135–145)
TCO2: 24 mmol/L (ref 22–32)

## 2019-09-30 LAB — DIFFERENTIAL
Abs Immature Granulocytes: 0.09 10*3/uL — ABNORMAL HIGH (ref 0.00–0.07)
Basophils Absolute: 0 10*3/uL (ref 0.0–0.1)
Basophils Relative: 0 %
Eosinophils Absolute: 0.1 10*3/uL (ref 0.0–0.5)
Eosinophils Relative: 1 %
Immature Granulocytes: 1 %
Lymphocytes Relative: 24 %
Lymphs Abs: 2.3 10*3/uL (ref 0.7–4.0)
Monocytes Absolute: 1 10*3/uL (ref 0.1–1.0)
Monocytes Relative: 10 %
Neutro Abs: 5.9 10*3/uL (ref 1.7–7.7)
Neutrophils Relative %: 64 %

## 2019-09-30 LAB — PROTIME-INR
INR: 1.2 (ref 0.8–1.2)
Prothrombin Time: 14.7 seconds (ref 11.4–15.2)

## 2019-09-30 LAB — CBC
HCT: 34.6 % — ABNORMAL LOW (ref 39.0–52.0)
Hemoglobin: 9.9 g/dL — ABNORMAL LOW (ref 13.0–17.0)
MCH: 27.5 pg (ref 26.0–34.0)
MCHC: 28.6 g/dL — ABNORMAL LOW (ref 30.0–36.0)
MCV: 96.1 fL (ref 80.0–100.0)
Platelets: 210 10*3/uL (ref 150–400)
RBC: 3.6 MIL/uL — ABNORMAL LOW (ref 4.22–5.81)
RDW: 13.5 % (ref 11.5–15.5)
WBC: 9.4 10*3/uL (ref 4.0–10.5)
nRBC: 0.7 % — ABNORMAL HIGH (ref 0.0–0.2)

## 2019-09-30 LAB — COMPREHENSIVE METABOLIC PANEL
ALT: 20 U/L (ref 0–44)
AST: 22 U/L (ref 15–41)
Albumin: 3.9 g/dL (ref 3.5–5.0)
Alkaline Phosphatase: 59 U/L (ref 38–126)
Anion gap: 20 — ABNORMAL HIGH (ref 5–15)
BUN: 44 mg/dL — ABNORMAL HIGH (ref 8–23)
CO2: 22 mmol/L (ref 22–32)
Calcium: 8.9 mg/dL (ref 8.9–10.3)
Chloride: 98 mmol/L (ref 98–111)
Creatinine, Ser: 9.25 mg/dL — ABNORMAL HIGH (ref 0.61–1.24)
GFR calc Af Amer: 6 mL/min — ABNORMAL LOW (ref >60)
GFR calc non Af Amer: 5 mL/min — ABNORMAL LOW (ref >60)
Glucose, Bld: 98 mg/dL (ref 70–99)
Potassium: 4 mmol/L (ref 3.5–5.1)
Sodium: 140 mmol/L (ref 135–145)
Total Bilirubin: 0.7 mg/dL (ref 0.3–1.2)
Total Protein: 7.6 g/dL (ref 6.5–8.1)

## 2019-09-30 LAB — CBG MONITORING, ED
Glucose-Capillary: 96 mg/dL (ref 70–99)
Glucose-Capillary: 89 mg/dL (ref 70–99)

## 2019-09-30 LAB — RESPIRATORY PANEL BY RT PCR (FLU A&B, COVID)
Influenza A by PCR: NEGATIVE
Influenza B by PCR: NEGATIVE
SARS Coronavirus 2 by RT PCR: NEGATIVE

## 2019-09-30 LAB — LACTIC ACID, PLASMA
Lactic Acid, Venous: 1.1 mmol/L (ref 0.5–1.9)
Lactic Acid, Venous: 1 mmol/L (ref 0.5–1.9)

## 2019-09-30 LAB — ETHANOL: Alcohol, Ethyl (B): <10 mg/dL (ref <10)

## 2019-09-30 LAB — APTT: aPTT: 37 seconds — ABNORMAL HIGH (ref 24–36)

## 2019-09-30 LAB — AMMONIA: Ammonia: 28 umol/L (ref 9–35)

## 2019-09-30 IMAGING — MR MR MRA NECK W/O CM
3 of 4 series · 35 of 48 positions shown · non-contrast
Comparison: None.

CLINICAL DATA: Acute neurologic deficit.  Stroke suspected.



[Series 13: FLAIR · axial · 5.0mm · 0.90mm/px · z∈[-55,+87]mm · 3 of 25 slices shown]
[im 1/25]
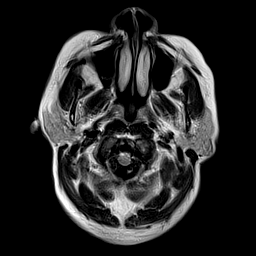
[im 13/25]
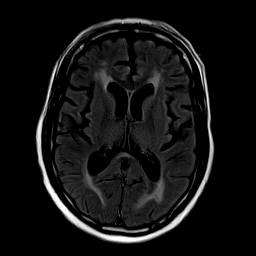
[im 25/25]
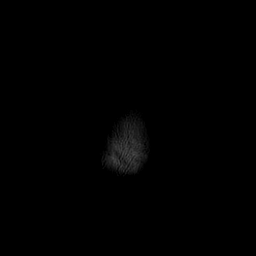

[Series 14: tof_fl3d_tra_iso · axial · 0.6mm · 0.52mm/px · z∈[-243,-68]mm · 31 of 295 slices shown]
[im 1/295]
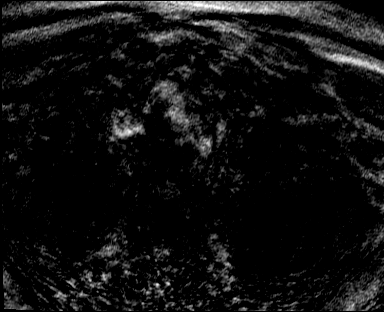
[im 10/295]
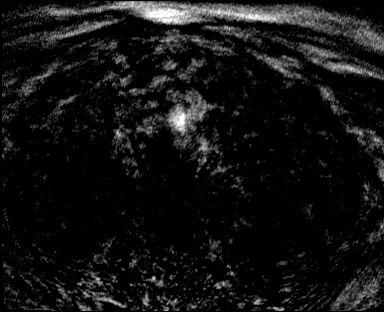
[im 20/295]
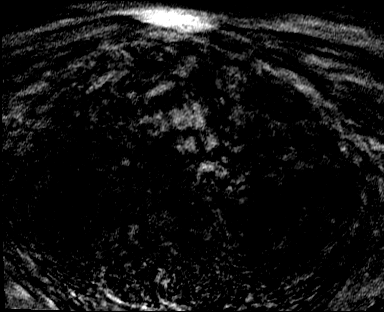
[im 30/295]
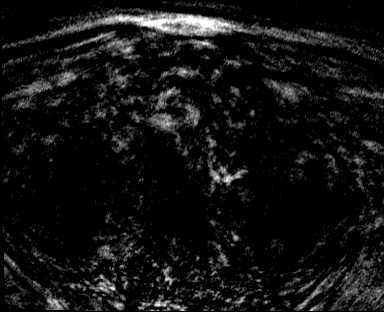
[im 40/295]
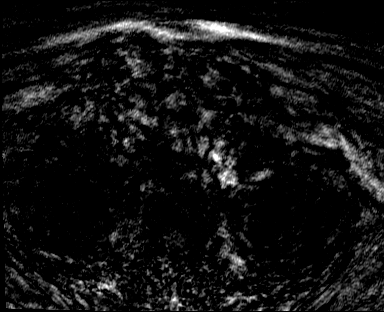
[im 50/295]
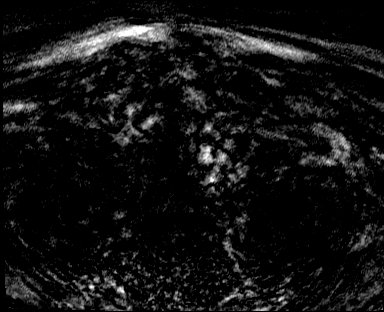
[im 59/295]
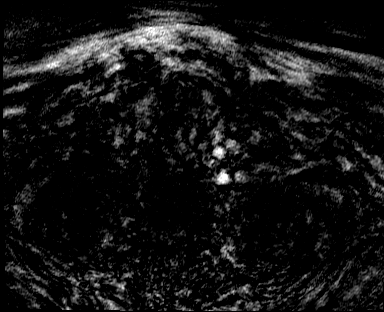
[im 69/295]
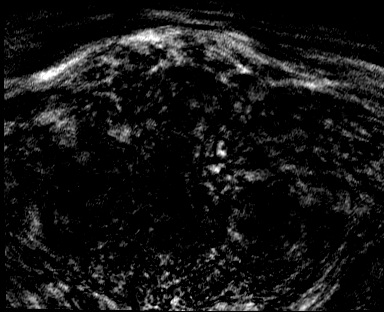
[im 79/295]
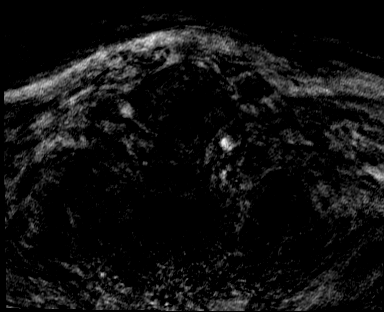
[im 89/295]
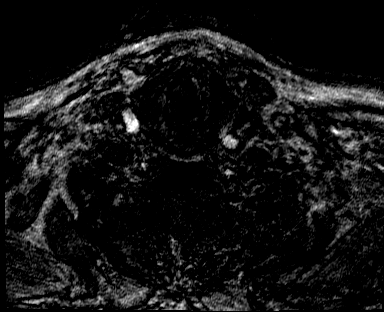
[im 99/295]
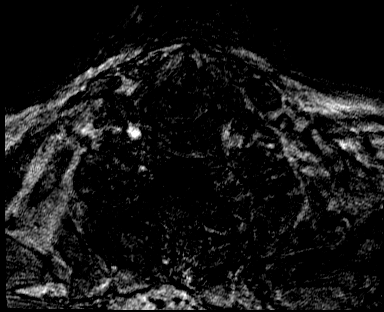
[im 108/295]
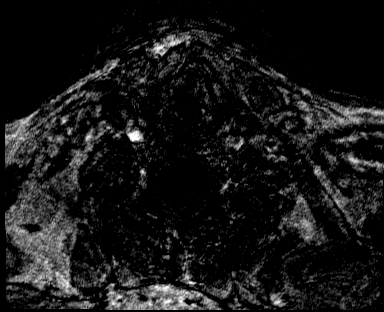
[im 118/295]
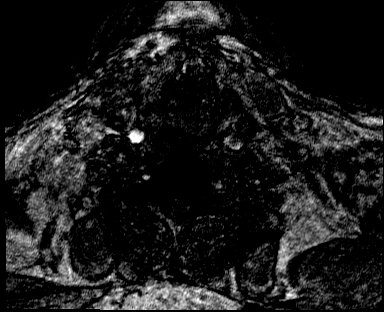
[im 128/295]
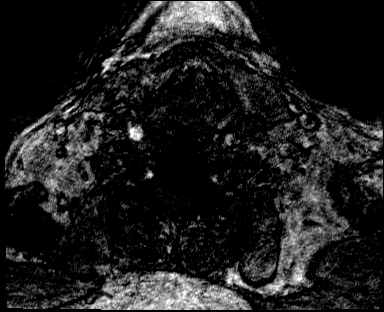
[im 138/295]
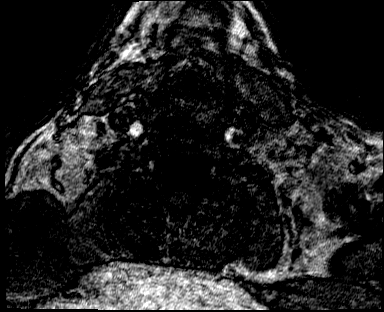
[im 148/295]
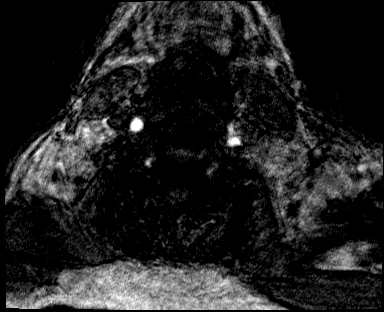
[im 157/295]
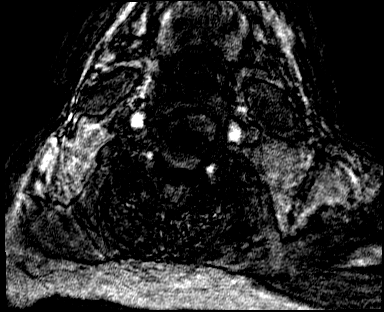
[im 167/295]
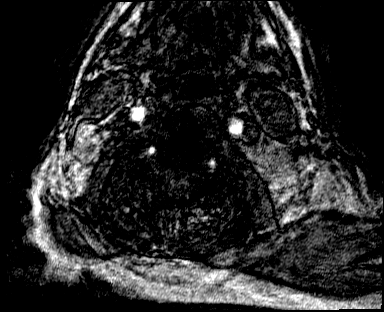
[im 177/295]
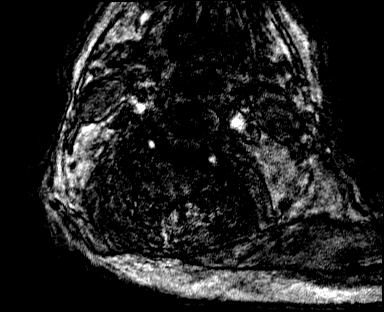
[im 187/295]
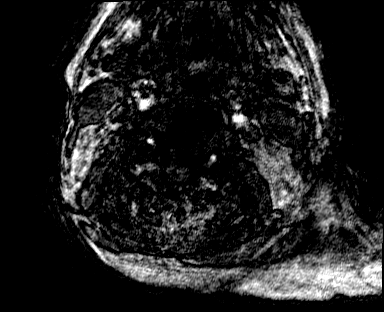
[im 197/295]
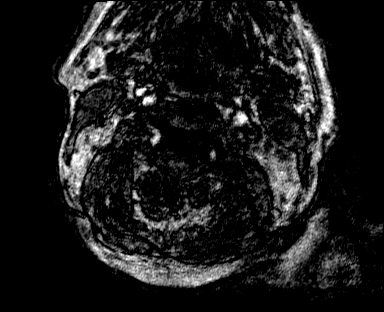
[im 206/295]
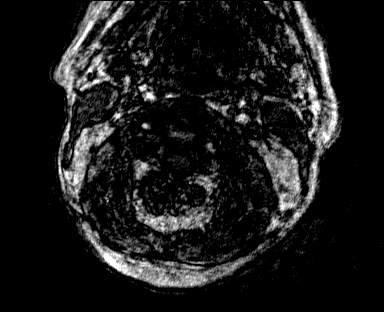
[im 216/295]
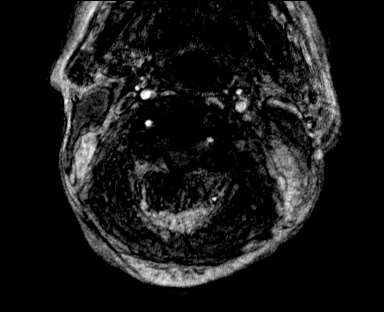
[im 226/295]
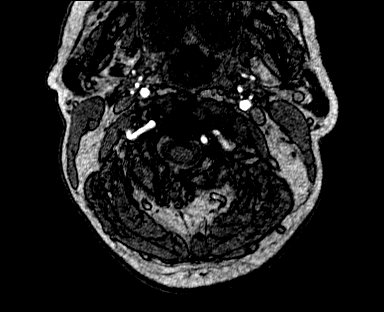
[im 236/295]
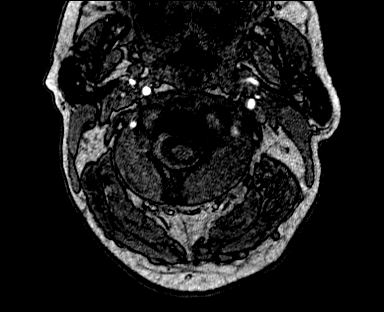
[im 246/295]
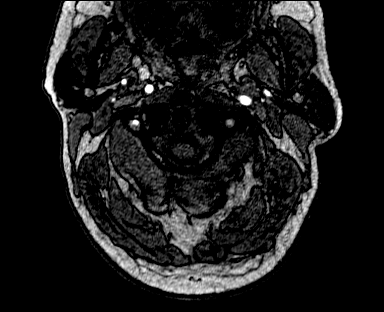
[im 255/295]
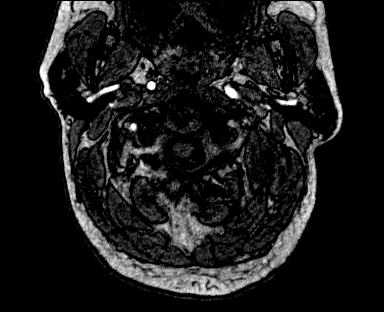
[im 265/295]
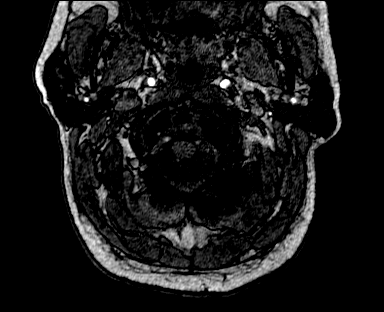
[im 275/295]
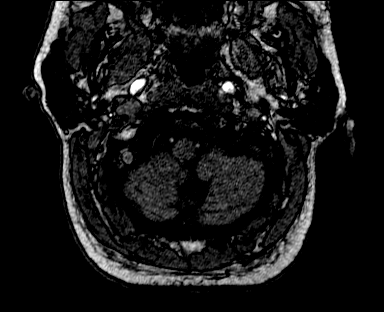
[im 285/295]
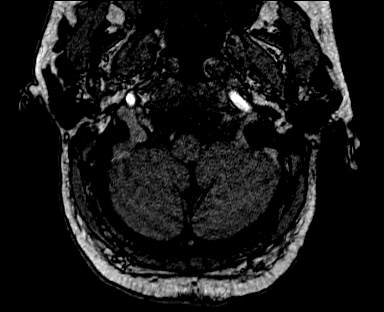
[im 295/295]
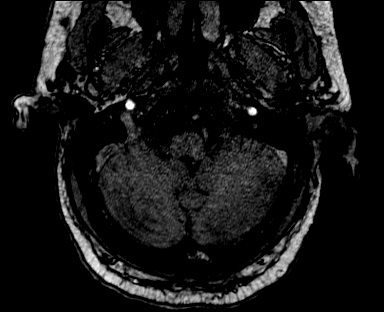

[Series 17: t1_se_fs_tra_blood-suppr. · axial · 2.0mm · 0.47mm/px · 1 of 12 slices shown]
[im 1/12]
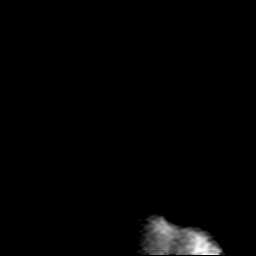

[35 of 48 positions shown; findings below may reference images not displayed]

FINDINGS: MRI HEAD FINDINGS

Brain: No acute infarct, acute hemorrhage or extra-axial collection.
Diffuse confluent hyperintense T2-weighted signal within the
periventricular, deep and juxtacortical white matter. There is
generalized atrophy without lobar predilection. Fewer than 5
scattered microhemorrhages in a nonspecific pattern. Normal midline
structures.

Vascular: Normal flow voids.

Skull and upper cervical spine: Normal marrow signal.

Sinuses/Orbits: Negative.

Other: None.

MRA HEAD FINDINGS

POSTERIOR CIRCULATION:

--Vertebral arteries: Normal V4 segments.

--Inferior cerebellar arteries: Normal.

--Basilar artery: Normal.

--Superior cerebellar arteries: Normal.

--Posterior cerebral arteries: Normal. There are bilateral posterior
communicating arteries (p-comm) that partially supply the PCAs.

ANTERIOR CIRCULATION:

--Intracranial internal carotid arteries: Normal.

--Anterior cerebral arteries (ACA): Normal. Both A1 segments are
present. Patent anterior communicating artery (a-comm).

--Middle cerebral arteries (MCA): Normal.

MRA NECK FINDINGS

MRA of the neck is greatly degraded by motion. There is no stenosis
at either carotid bifurcation. The V2 segments of the vertebral
arteries are normal. Otherwise, visualization is poor.
IMPRESSION: 1. No acute intracranial abnormality.  Normal intracranial MRA.
2. Advanced chronic small vessel disease and generalized atrophy.
3. Motion degraded MRA of the neck without emergent large vessel
occlusion or high-grade stenosis.

## 2019-09-30 IMAGING — DX DG CHEST 1V PORT
1 series · 1 of 1 positions shown · non-contrast
Comparison: [DATE]

CLINICAL DATA: Altered mental status

EXAM:
PORTABLE CHEST 1 VIEW

[chest ap]
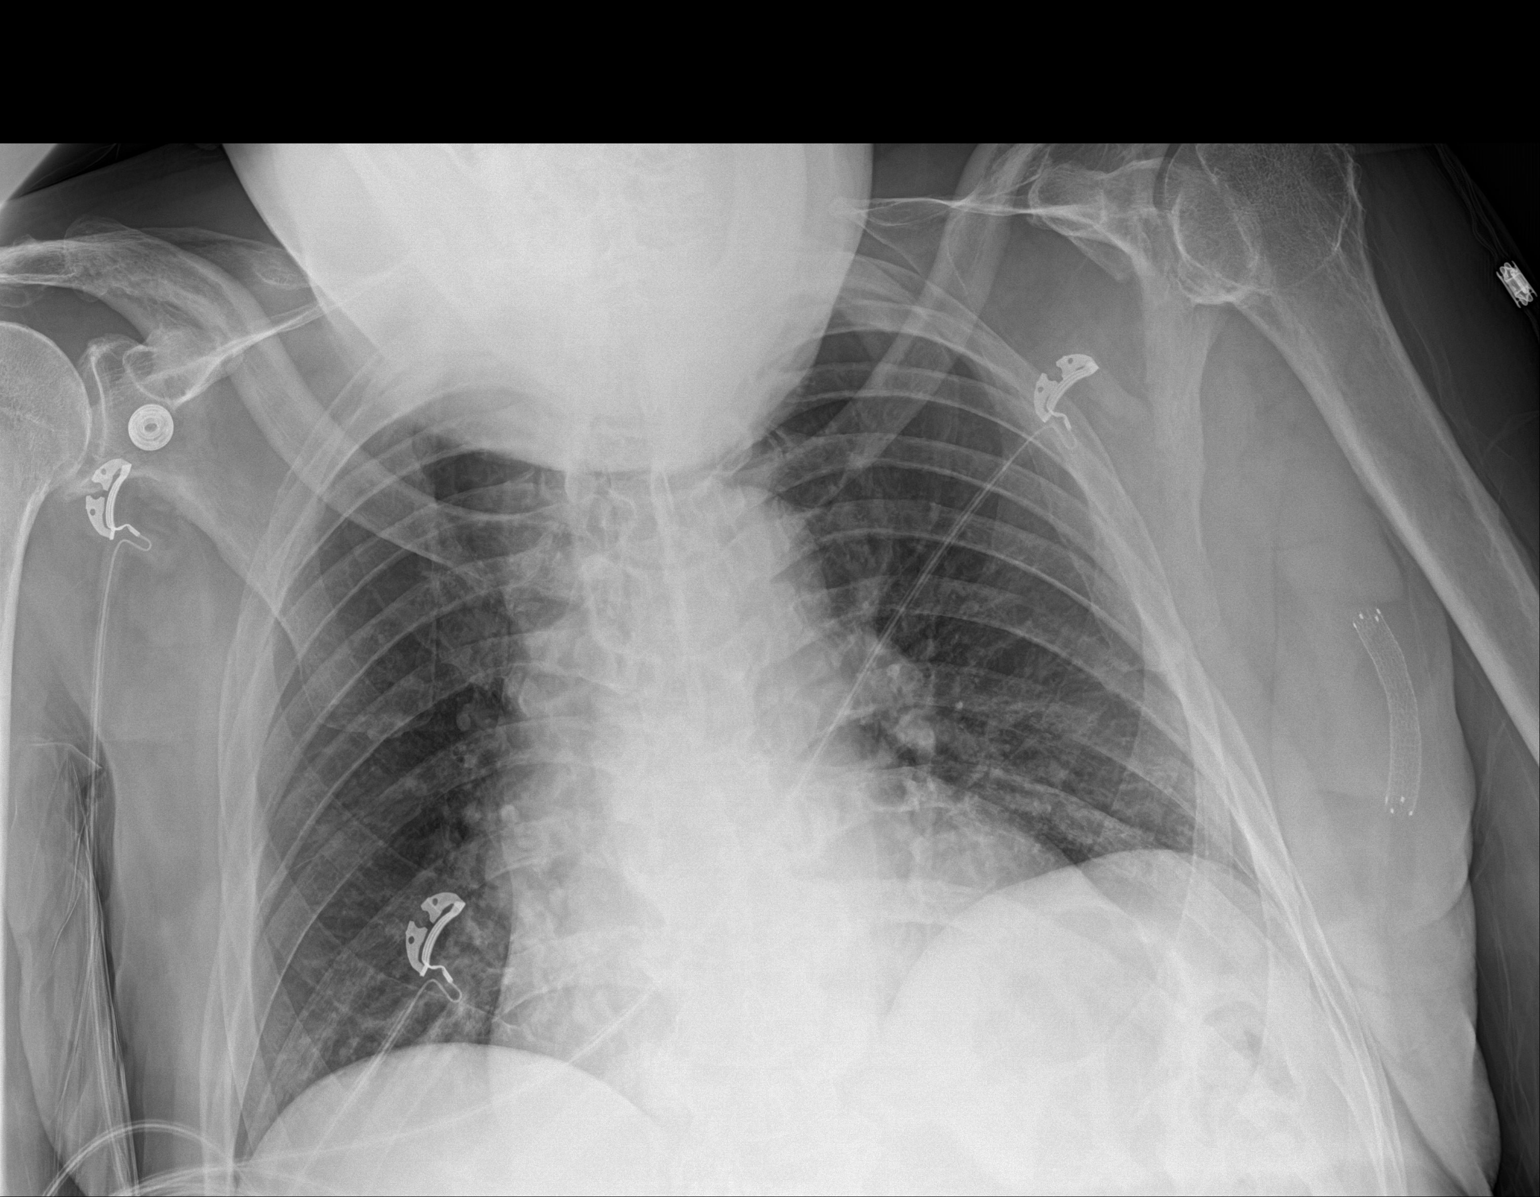

[1 of 1 positions shown; findings below may reference images not displayed]

FINDINGS: Hypoventilation. Decreased lung volume with mild atelectasis in the
bases. Negative for heart failure or pneumonia. No effusion.
Vascular stent in the left upper arm.
IMPRESSION: Hypoventilation.  No acute abnormality.

## 2019-09-30 IMAGING — CT CT HEAD W/O CM
4 series · 15 of 47 positions shown, 17 images · non-contrast
Comparison: No pertinent prior exams are available for comparison.

CLINICAL DATA: Neuro deficit, acute, stroke suspected. Additional
history provided: Patient presents with stroke like symptoms,
repeating his name and unable to follow commands.

EXAM:
CT HEAD WITHOUT CONTRAST
TECHNIQUE: Contiguous axial images were obtained from the base of the skull
through the vertex without intravenous contrast.

[Series 3: head without · axial · non-contrast · 0.45mm/px · z∈[+1391,+1496]mm · 6 of 31 slices shown, 8 images]
[im 5/31  brain]
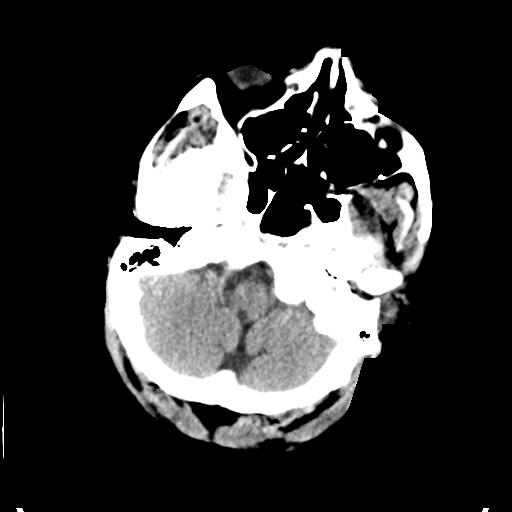
[im 5/31  bone]
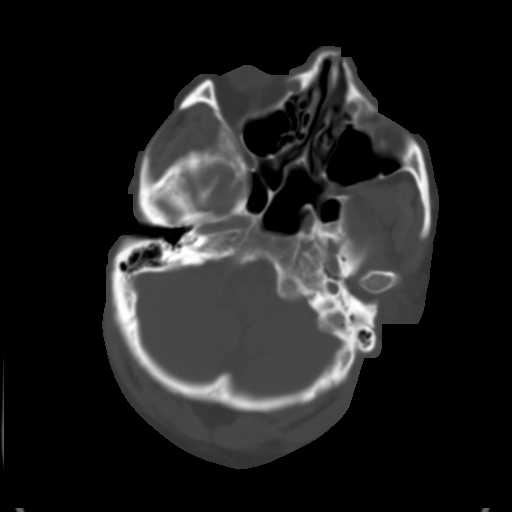
[im 9/31  brain]
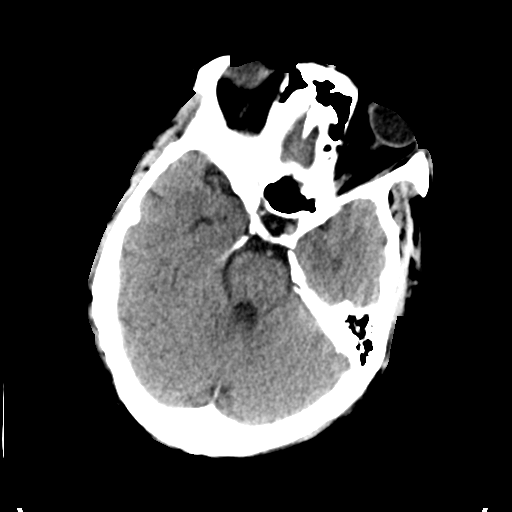
[im 13/31  brain]
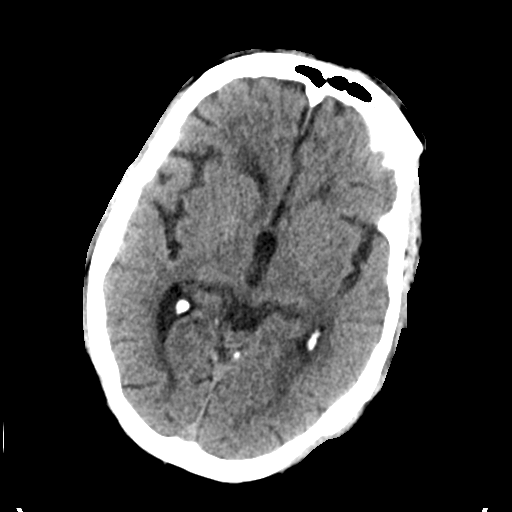
[im 18/31  brain]
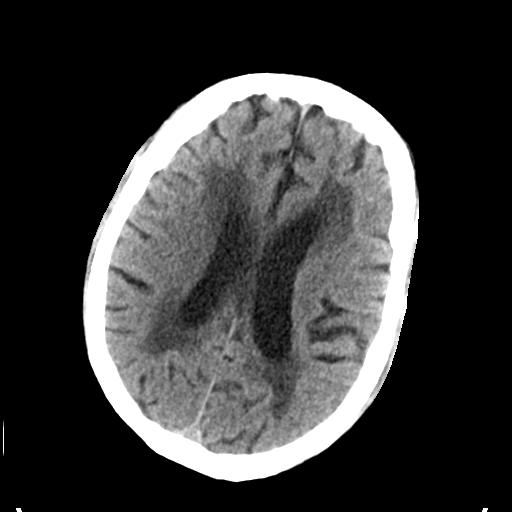
[im 22/31  brain]
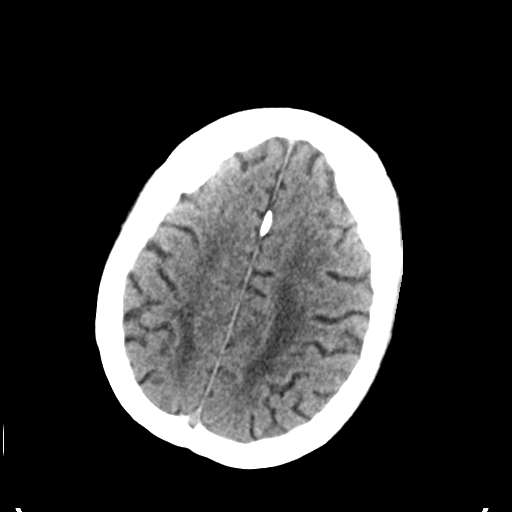
[im 22/31  bone]
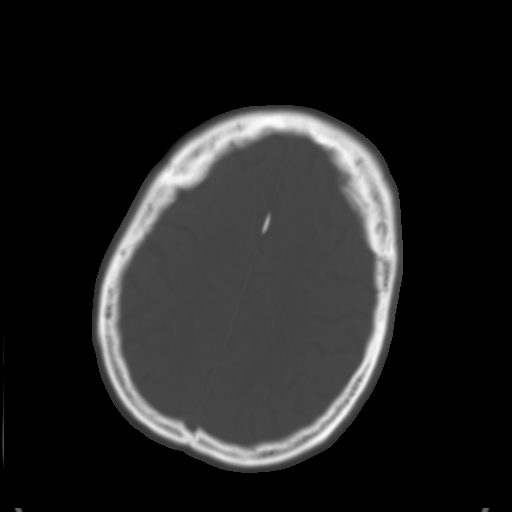
[im 26/31  brain]
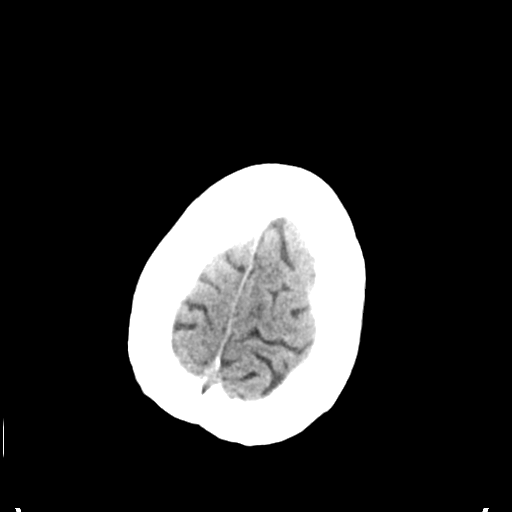

[Series 4: head bone · axial · 0.45mm/px · z∈[+1385,+1423]mm · 3 of 79 slices shown]
[im 8/79  bone]
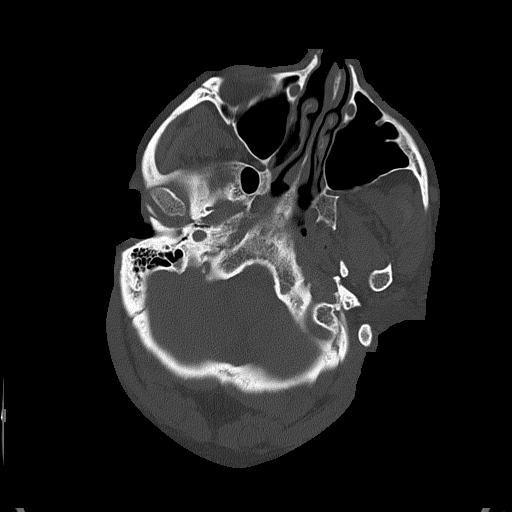
[im 15/79  bone]
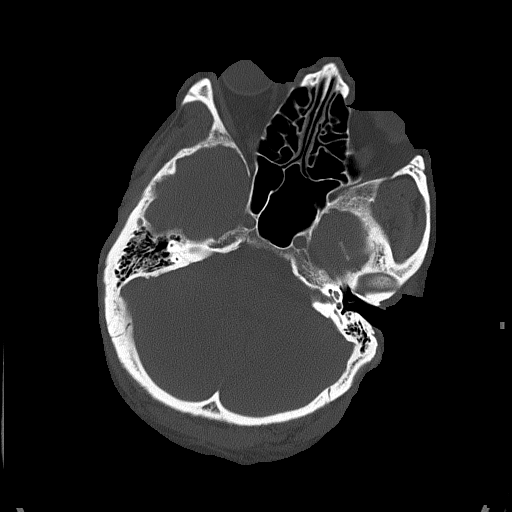
[im 27/79  bone]
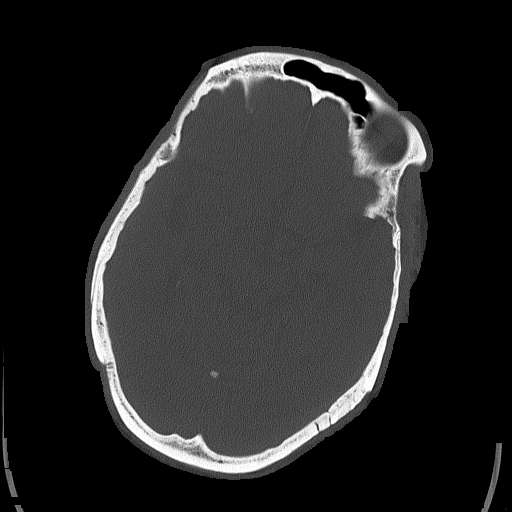

[Series 5: head without cor · coronal · non-contrast · 0.31mm/px · 3 of 73 slices shown]
[im 25/73  brain]
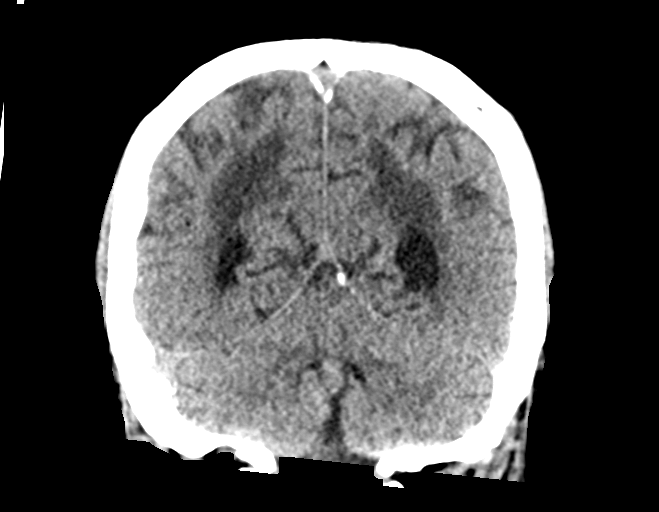
[im 33/73  brain]
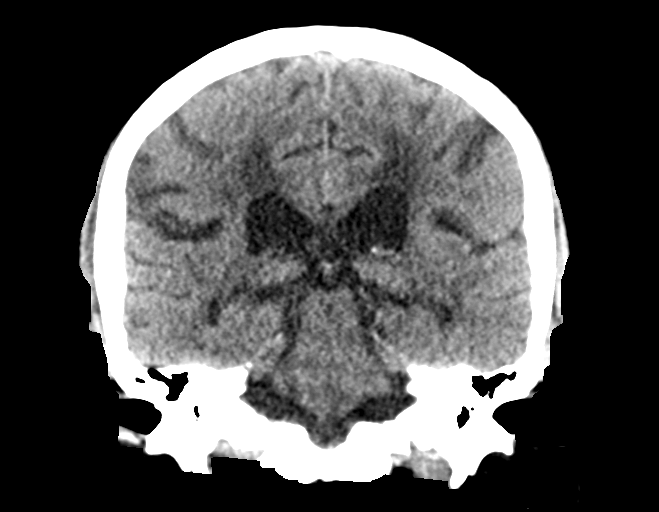
[im 41/73  brain]
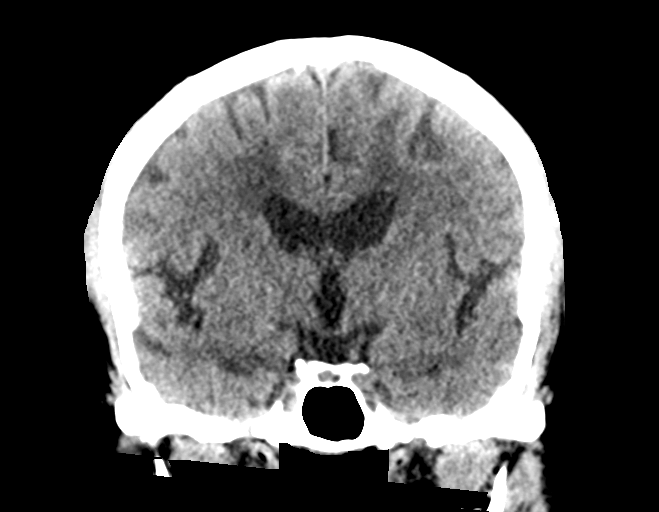

[Series 6: head without sag · sagittal · non-contrast · 0.34mm/px · 3 of 67 slices shown]
[im 23/67  brain]
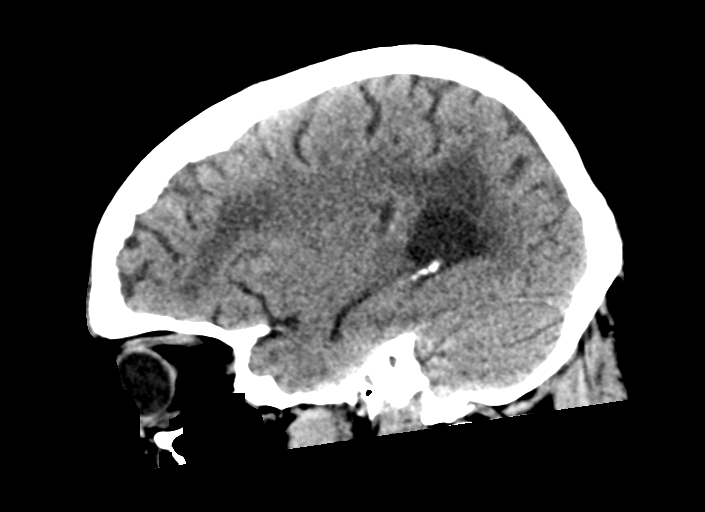
[im 34/67  brain]
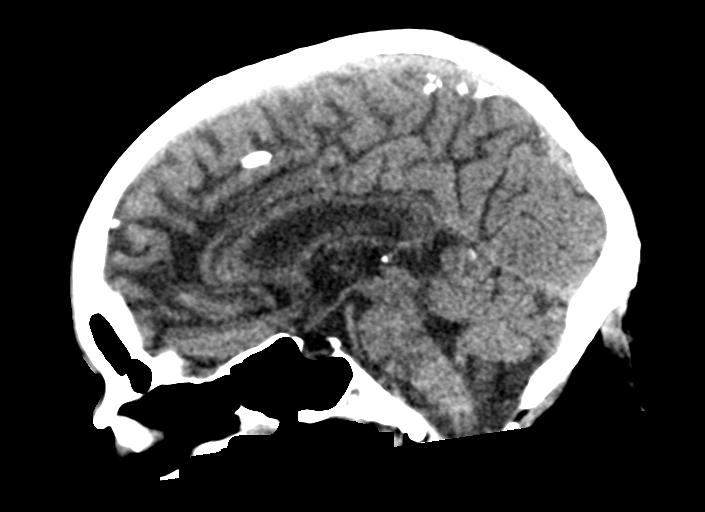
[im 45/67  brain]
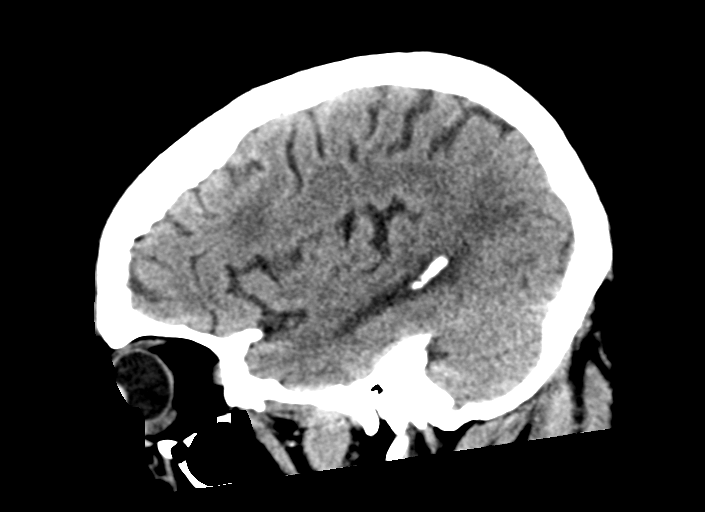

[15 of 47 positions shown; findings below may reference images not displayed]

FINDINGS: Brain:

Mild-to-moderate generalized cerebral atrophy.

Advanced ill-defined hypoattenuation within the cerebral white
matter which is nonspecific, but consistent with chronic small
vessel ischemic disease.

There is no acute intracranial hemorrhage.

No demarcated cortical infarct.

No extra-axial fluid collection.

No evidence of intracranial mass.

No midline shift.

Vascular: No hyperdense vessel.

Skull: Normal. Negative for fracture or focal lesion.

Sinuses/Orbits: Visualized orbits show no acute finding. No
significant paranasal sinus disease or mastoid effusion at the
imaged levels.

Other: Presumed chronic fracture deformity of the left nasal bone
without appreciable overlying soft tissue swelling (series 4, image
10).
IMPRESSION: No CT evidence of acute intracranial abnormality.

Advanced cerebral white matter chronic small vessel ischemic
disease.

Mild-to-moderate generalized cerebral atrophy.

A fracture deformity of the left nasal bone is presumed chronic.
However, clinical correlation is recommended.

## 2019-09-30 IMAGING — MR MR MRA HEAD W/O CM
1 series · 25 of 48 positions shown · non-contrast
Comparison: None.

CLINICAL DATA: Acute neurologic deficit.  Stroke suspected.



[Series 9: 3d cow · axial · 0.5mm · 0.41mm/px · z∈[-67,+22]mm · 25 of 188 slices shown]
[im 1/188]
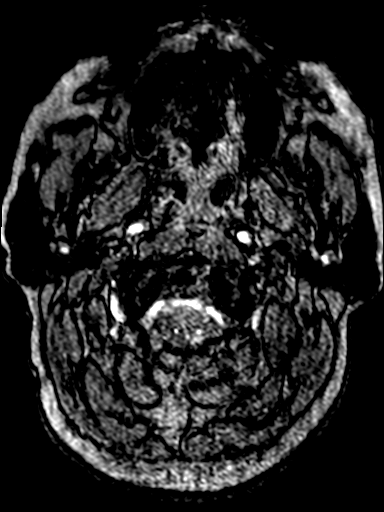
[im 4/188]
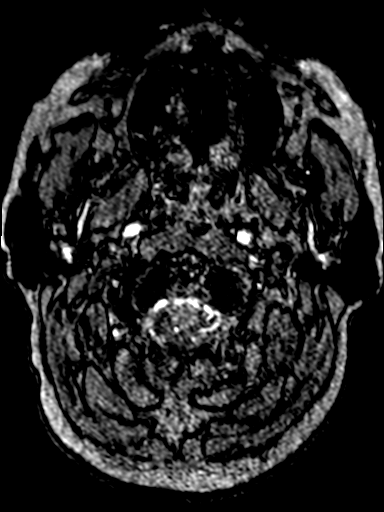
[im 8/188]
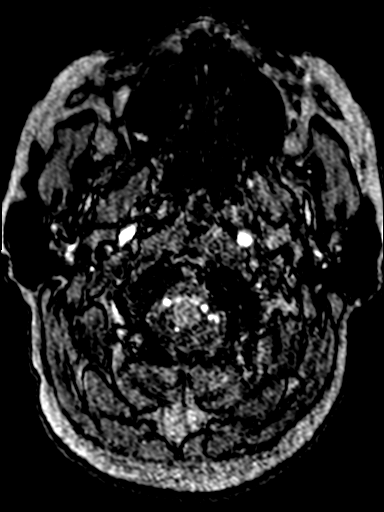
[im 12/188]
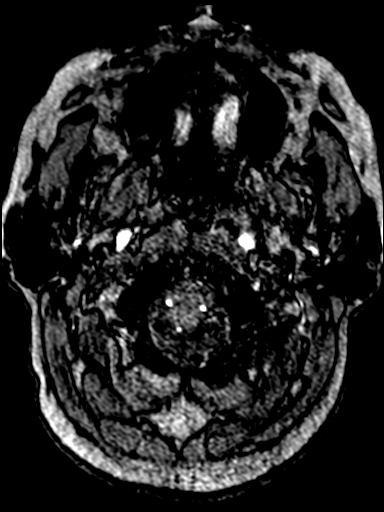
[im 16/188]
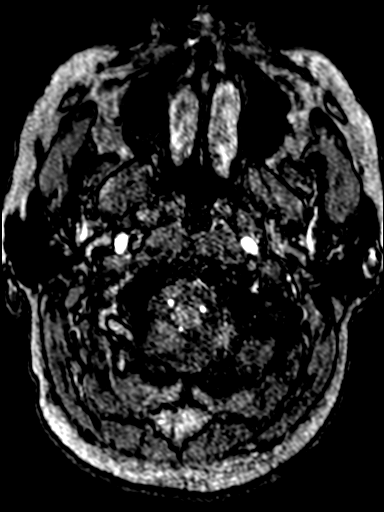
[im 20/188]
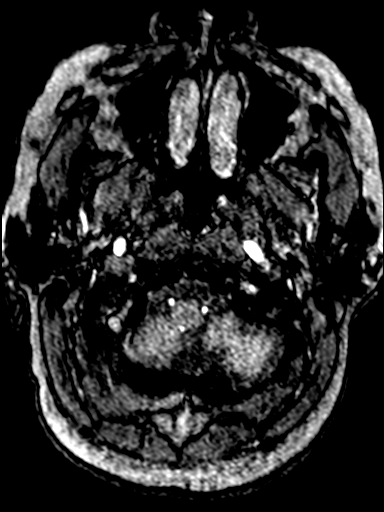
[im 24/188]
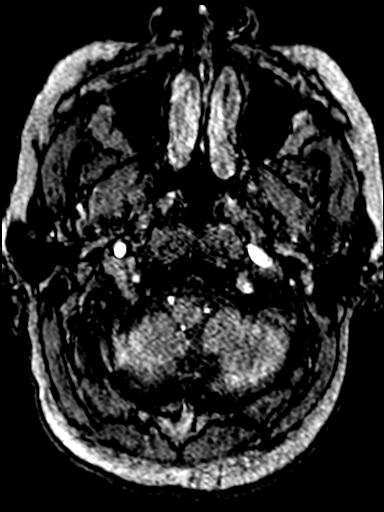
[im 28/188]
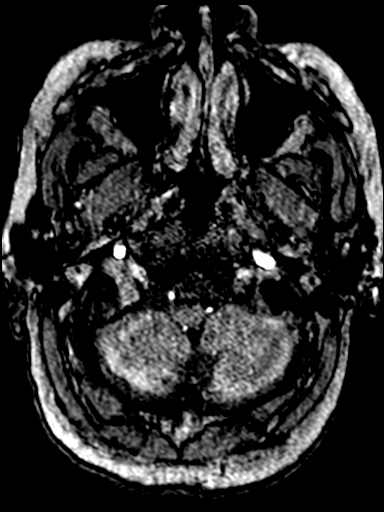
[im 32/188]
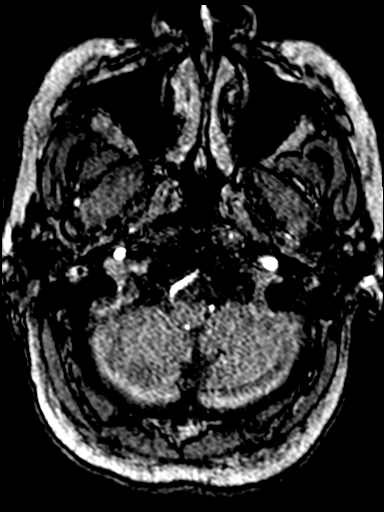
[im 36/188]
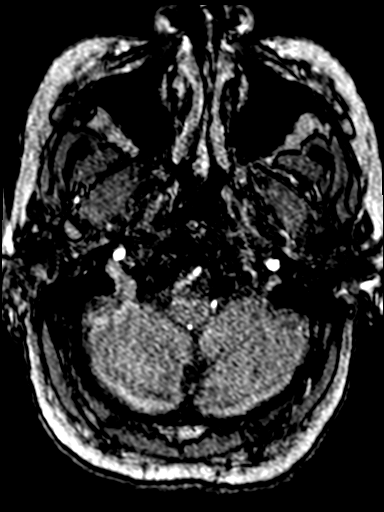
[im 40/188]
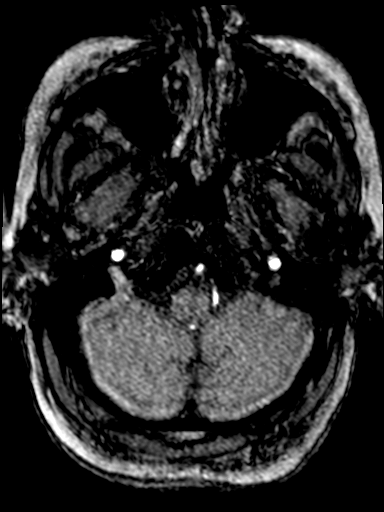
[im 44/188]
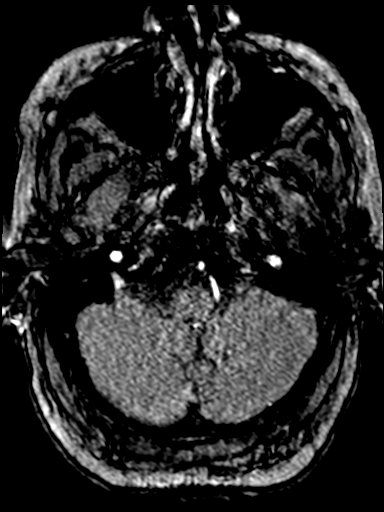
[im 48/188]
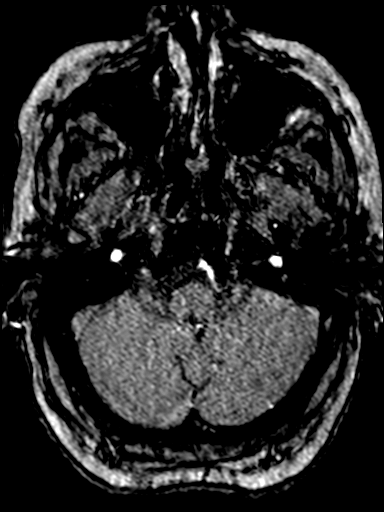
[im 52/188]
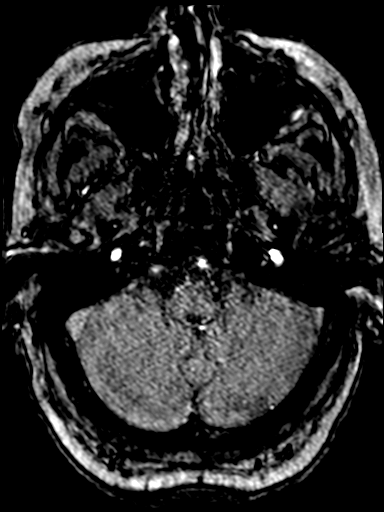
[im 56/188]
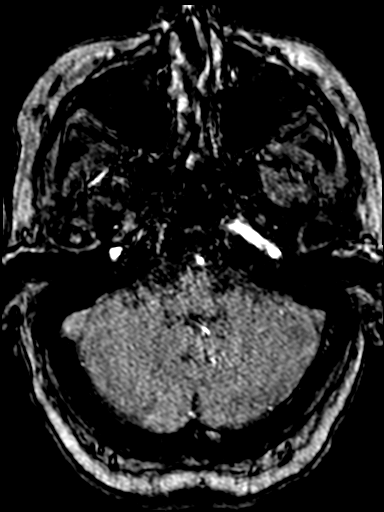
[im 60/188]
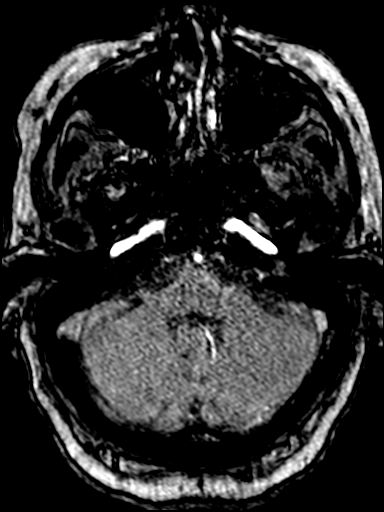
[im 64/188]
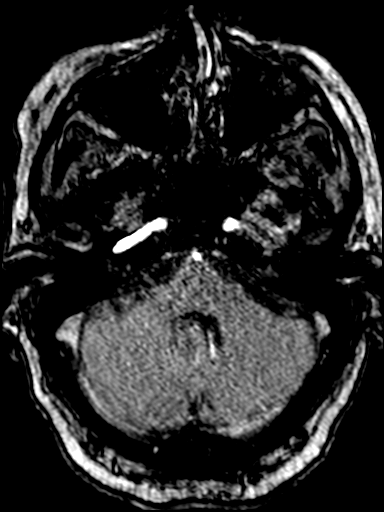
[im 68/188]
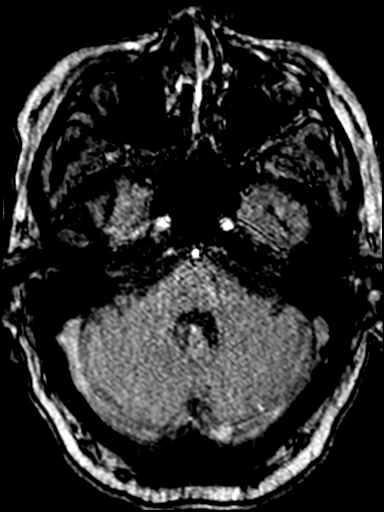
[im 84/188]
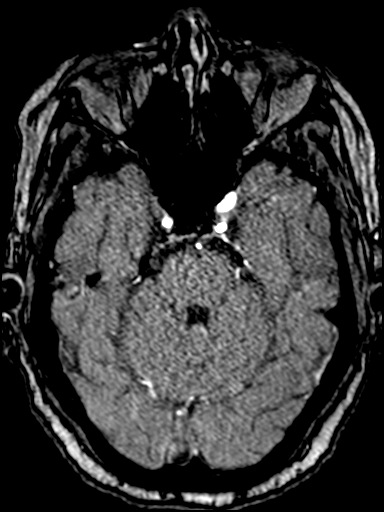
[im 96/188]
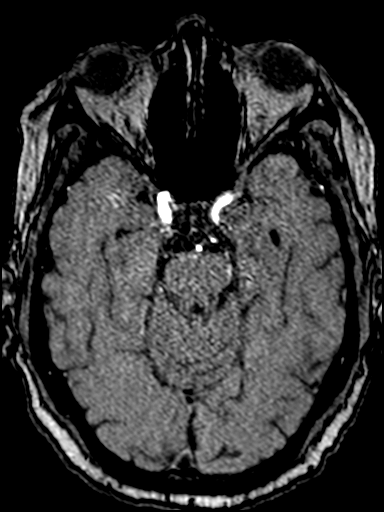
[im 108/188]
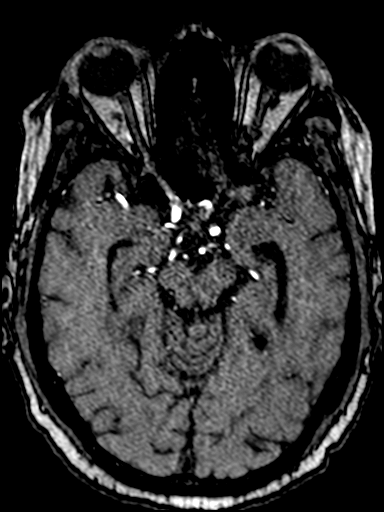
[im 132/188]
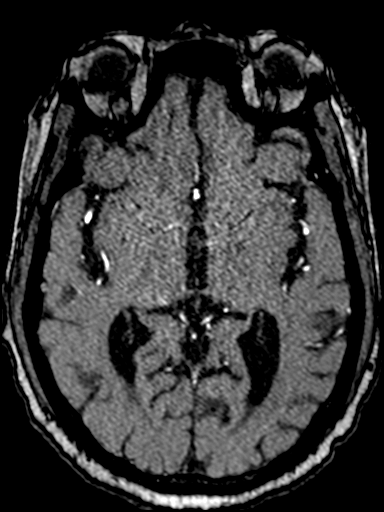
[im 156/188]
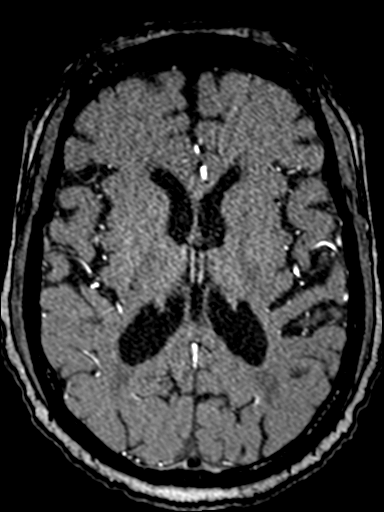
[im 160/188]
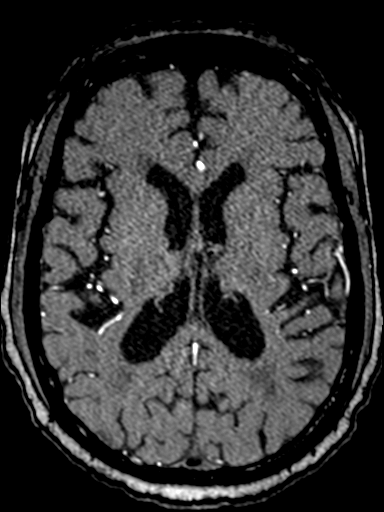
[im 180/188]
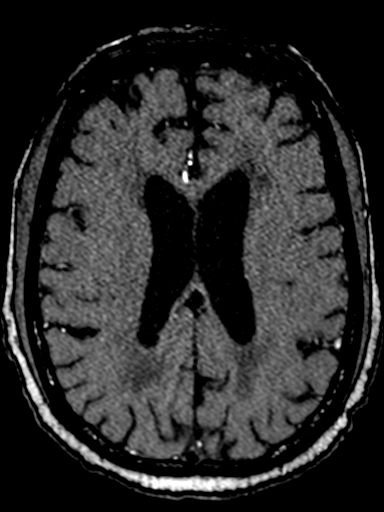

[25 of 48 positions shown; findings below may reference images not displayed]

FINDINGS: MRI HEAD FINDINGS

Brain: No acute infarct, acute hemorrhage or extra-axial collection.
Diffuse confluent hyperintense T2-weighted signal within the
periventricular, deep and juxtacortical white matter. There is
generalized atrophy without lobar predilection. Fewer than 5
scattered microhemorrhages in a nonspecific pattern. Normal midline
structures.

Vascular: Normal flow voids.

Skull and upper cervical spine: Normal marrow signal.

Sinuses/Orbits: Negative.

Other: None.

MRA HEAD FINDINGS

POSTERIOR CIRCULATION:

--Vertebral arteries: Normal V4 segments.

--Inferior cerebellar arteries: Normal.

--Basilar artery: Normal.

--Superior cerebellar arteries: Normal.

--Posterior cerebral arteries: Normal. There are bilateral posterior
communicating arteries (p-comm) that partially supply the PCAs.

ANTERIOR CIRCULATION:

--Intracranial internal carotid arteries: Normal.

--Anterior cerebral arteries (ACA): Normal. Both A1 segments are
present. Patent anterior communicating artery (a-comm).

--Middle cerebral arteries (MCA): Normal.

MRA NECK FINDINGS

MRA of the neck is greatly degraded by motion. There is no stenosis
at either carotid bifurcation. The V2 segments of the vertebral
arteries are normal. Otherwise, visualization is poor.
IMPRESSION: 1. No acute intracranial abnormality.  Normal intracranial MRA.
2. Advanced chronic small vessel disease and generalized atrophy.
3. Motion degraded MRA of the neck without emergent large vessel
occlusion or high-grade stenosis.

## 2019-09-30 IMAGING — MR MR HEAD W/O CM
10 of 12 series · 31 of 48 positions shown · non-contrast
Comparison: None.

CLINICAL DATA: Acute neurologic deficit.  Stroke suspected.



[Series 5: DWI · axial · 3.0mm · 0.88mm/px · z∈[-60,+92]mm · 6 of 104 slices shown (1 of 4)]
[im 1/104]
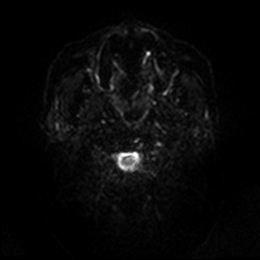
[im 21/104]
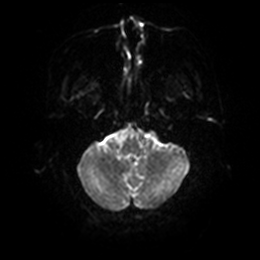
[im 42/104]
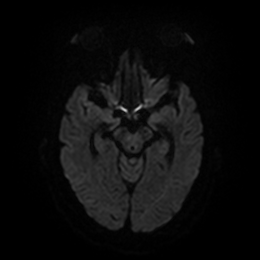
[im 62/104]
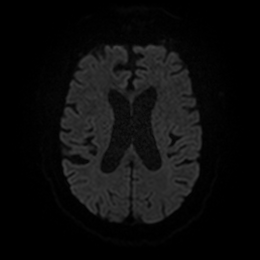
[im 83/104]
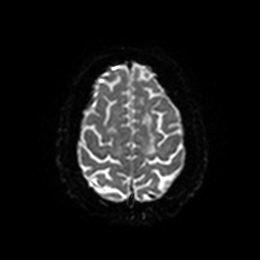
[im 104/104]
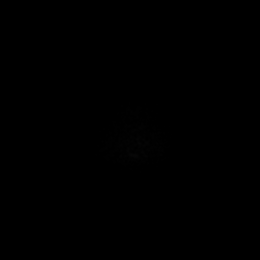

[Series 6: DWI · axial · 3.0mm · 0.88mm/px · z∈[-60,+92]mm · 3 of 52 slices shown (2 of 4)]
[im 1/52]
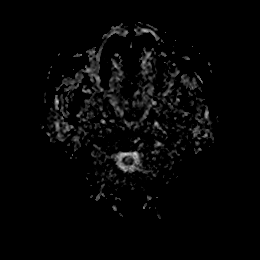
[im 26/52]
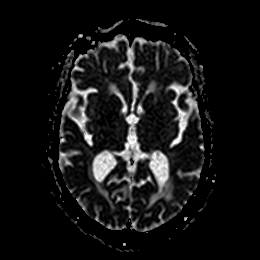
[im 52/52]
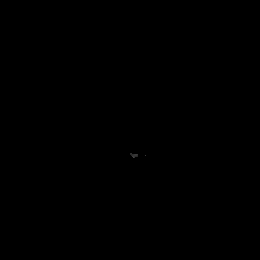

[Series 7: DWI · coronal · 4.0mm · 0.88mm/px · 5 of 72 slices shown (3 of 4)]
[im 1/72]
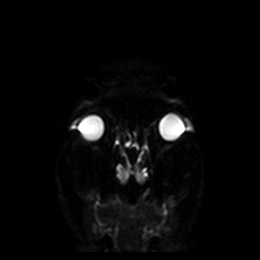
[im 18/72]
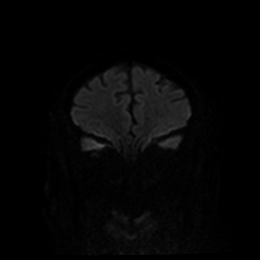
[im 36/72]
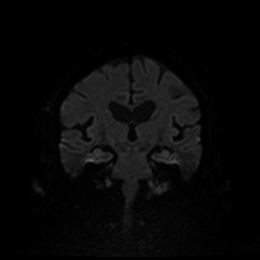
[im 54/72]
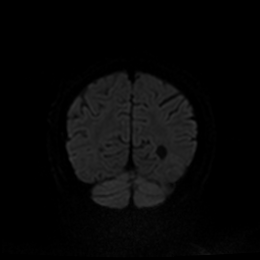
[im 72/72]
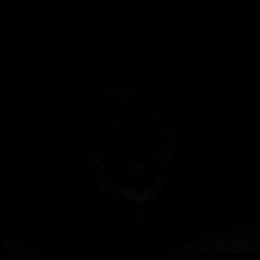

[Series 8: DWI · coronal · 4.0mm · 0.88mm/px · 2 of 36 slices shown (4 of 4)]
[im 1/36]
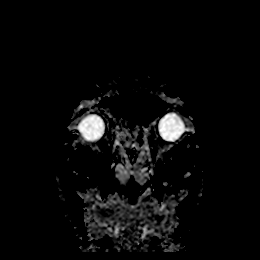
[im 36/36]
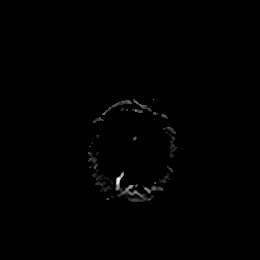

[Series 13: FLAIR · axial · 5.0mm · 0.90mm/px · z∈[-55,+87]mm · 2 of 25 slices shown]
[im 1/25]
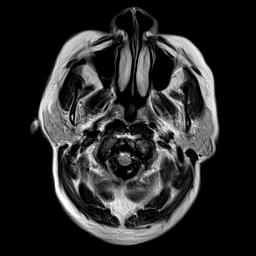
[im 25/25]
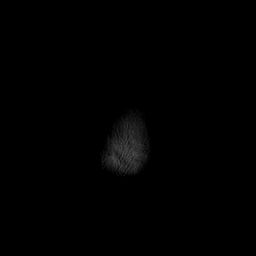

[Series 15: pha_images · axial · 3.0mm · 0.90mm/px · z∈[-52,+90]mm · 3 of 49 slices shown]
[im 1/49]
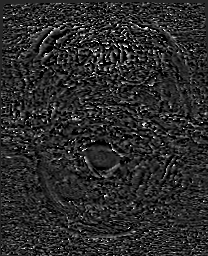
[im 25/49]
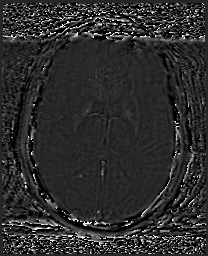
[im 49/49]
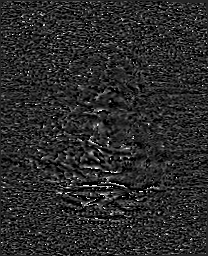

[Series 16: swi_images · axial · 3.0mm · 0.90mm/px · z∈[-58,+93]mm · 4 of 52 slices shown]
[im 1/52]
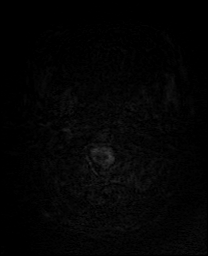
[im 18/52]
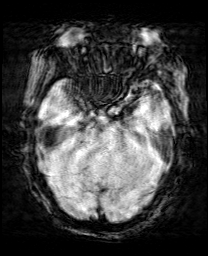
[im 35/52]
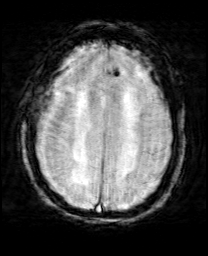
[im 52/52]
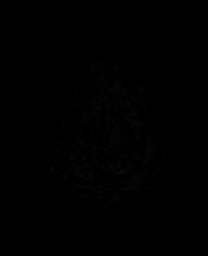

[Series 18: T2 · axial · 5.0mm · 0.72mm/px · z∈[-55,+87]mm · 2 of 25 slices shown]
[im 1/25]
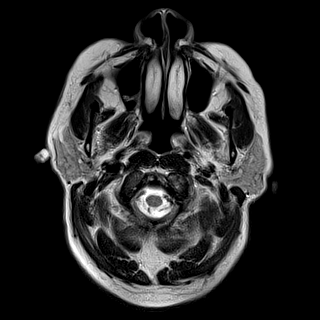
[im 25/25]
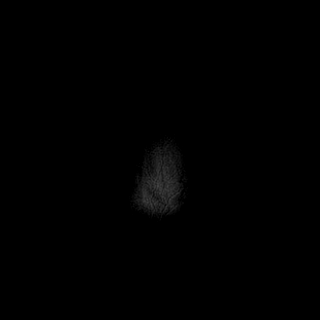

[Series 19: T1 · sagittal · 5.0mm · 0.75mm/px · 2 of 25 slices shown]
[im 1/25]
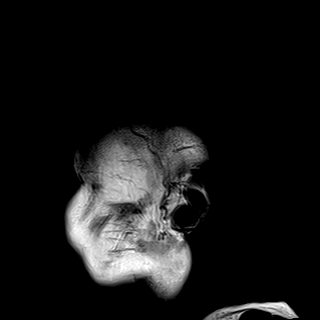
[im 25/25]
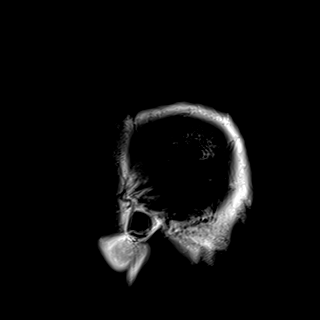

[Series 21: T2 post-contrast · coronal · 5.0mm · 0.90mm/px · 2 of 28 slices shown]
[im 1/28]
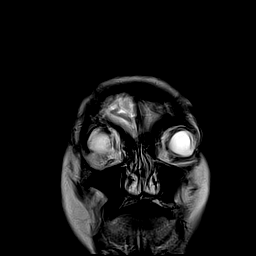
[im 28/28]
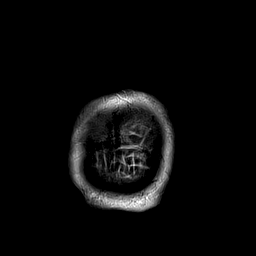

[31 of 48 positions shown; findings below may reference images not displayed]

FINDINGS: MRI HEAD FINDINGS

Brain: No acute infarct, acute hemorrhage or extra-axial collection.
Diffuse confluent hyperintense T2-weighted signal within the
periventricular, deep and juxtacortical white matter. There is
generalized atrophy without lobar predilection. Fewer than 5
scattered microhemorrhages in a nonspecific pattern. Normal midline
structures.

Vascular: Normal flow voids.

Skull and upper cervical spine: Normal marrow signal.

Sinuses/Orbits: Negative.

Other: None.

MRA HEAD FINDINGS

POSTERIOR CIRCULATION:

--Vertebral arteries: Normal V4 segments.

--Inferior cerebellar arteries: Normal.

--Basilar artery: Normal.

--Superior cerebellar arteries: Normal.

--Posterior cerebral arteries: Normal. There are bilateral posterior
communicating arteries (p-comm) that partially supply the PCAs.

ANTERIOR CIRCULATION:

--Intracranial internal carotid arteries: Normal.

--Anterior cerebral arteries (ACA): Normal. Both A1 segments are
present. Patent anterior communicating artery (a-comm).

--Middle cerebral arteries (MCA): Normal.

MRA NECK FINDINGS

MRA of the neck is greatly degraded by motion. There is no stenosis
at either carotid bifurcation. The V2 segments of the vertebral
arteries are normal. Otherwise, visualization is poor.
IMPRESSION: 1. No acute intracranial abnormality.  Normal intracranial MRA.
2. Advanced chronic small vessel disease and generalized atrophy.
3. Motion degraded MRA of the neck without emergent large vessel
occlusion or high-grade stenosis.

## 2019-09-30 MED ORDER — CHLORHEXIDINE GLUCONATE CLOTH 2 % EX PADS
6.0000 | MEDICATED_PAD | Freq: Every day | CUTANEOUS | Status: DC
  Administered 2019-10-02: 6 via TOPICAL

## 2019-09-30 MED ORDER — ACETAMINOPHEN 325 MG PO TABS: 650.0000 mg | ORAL_TABLET | Freq: Four times a day (QID) | ORAL | Status: DC | PRN

## 2019-09-30 MED ORDER — TAMSULOSIN HCL 0.4 MG PO CAPS
0.4000 mg | ORAL_CAPSULE | Freq: Every day | ORAL | Status: DC
  Administered 2019-09-30 – 2019-10-02 (×3): 0.4 mg via ORAL
  Filled 2019-09-30 (×3): qty 1

## 2019-09-30 MED ORDER — HYDRALAZINE HCL 25 MG PO TABS
25.0000 mg | ORAL_TABLET | Freq: Three times a day (TID) | ORAL | Status: DC
  Administered 2019-09-30 – 2019-10-03 (×8): 25 mg via ORAL
  Filled 2019-09-30 (×8): qty 1

## 2019-09-30 MED ORDER — ONDANSETRON HCL 4 MG PO TABS: 4.0000 mg | ORAL_TABLET | Freq: Four times a day (QID) | ORAL | Status: DC | PRN

## 2019-09-30 MED ORDER — APIXABAN 5 MG PO TABS
5.0000 mg | ORAL_TABLET | Freq: Two times a day (BID) | ORAL | Status: DC
  Administered 2019-09-30 – 2019-10-03 (×6): 5 mg via ORAL
  Filled 2019-09-30 (×6): qty 1

## 2019-09-30 MED ORDER — ACETAMINOPHEN 650 MG RE SUPP: 650.0000 mg | Freq: Four times a day (QID) | RECTAL | Status: DC | PRN

## 2019-09-30 MED ORDER — HYDRALAZINE HCL 20 MG/ML IJ SOLN
10.0000 mg | Freq: Four times a day (QID) | INTRAMUSCULAR | Status: DC | PRN
  Administered 2019-10-01: 10 mg via INTRAVENOUS
  Filled 2019-09-30: qty 1

## 2019-09-30 MED ORDER — SODIUM BICARBONATE 650 MG PO TABS
650.0000 mg | ORAL_TABLET | Freq: Two times a day (BID) | ORAL | Status: DC
  Administered 2019-09-30: 650 mg via ORAL
  Filled 2019-09-30 (×2): qty 1

## 2019-09-30 MED ORDER — SODIUM CHLORIDE 0.9% FLUSH: 3.0000 mL | Freq: Once | INTRAVENOUS | Status: DC

## 2019-09-30 MED ORDER — CLONIDINE HCL 0.1 MG PO TABS
0.1000 mg | ORAL_TABLET | Freq: Three times a day (TID) | ORAL | Status: DC
  Administered 2019-09-30 – 2019-10-03 (×8): 0.1 mg via ORAL
  Filled 2019-09-30 (×8): qty 1

## 2019-09-30 MED ORDER — FERRIC CITRATE 1 GM 210 MG(FE) PO TABS
210.0000 mg | ORAL_TABLET | Freq: Three times a day (TID) | ORAL | Status: DC
  Administered 2019-10-01 – 2019-10-03 (×6): 210 mg via ORAL
  Filled 2019-09-30 (×8): qty 1

## 2019-09-30 MED ORDER — NEBIVOLOL HCL 5 MG PO TABS
10.0000 mg | ORAL_TABLET | Freq: Every day | ORAL | Status: DC
  Administered 2019-09-30 – 2019-10-02 (×3): 10 mg via ORAL
  Filled 2019-09-30 (×2): qty 2
  Filled 2019-09-30: qty 1

## 2019-09-30 MED ORDER — AMLODIPINE BESYLATE 5 MG PO TABS
5.0000 mg | ORAL_TABLET | Freq: Every day | ORAL | Status: DC
  Administered 2019-10-01 – 2019-10-03 (×3): 5 mg via ORAL
  Filled 2019-09-30 (×3): qty 1

## 2019-09-30 MED ORDER — CALCITRIOL 0.25 MCG PO CAPS
0.2500 ug | ORAL_CAPSULE | Freq: Every day | ORAL | Status: DC
  Administered 2019-10-01 – 2019-10-03 (×3): 0.25 ug via ORAL
  Filled 2019-09-30 (×4): qty 1

## 2019-09-30 MED ORDER — ONDANSETRON HCL 4 MG/2ML IJ SOLN: 4.0000 mg | Freq: Four times a day (QID) | INTRAMUSCULAR | Status: DC | PRN

## 2019-09-30 MED ORDER — ALLOPURINOL 100 MG PO TABS
100.0000 mg | ORAL_TABLET | Freq: Every day | ORAL | Status: DC
  Administered 2019-10-01 – 2019-10-03 (×3): 100 mg via ORAL
  Filled 2019-09-30 (×4): qty 1

## 2019-09-30 NOTE — ED Provider Notes (Signed)
[facility] EMERGENCY DEPARTMENT Provider Note   CSN: [id number] Arrival date & time: 09/30/19  1151     History Chief Complaint  Patient presents with  . Altered Mental Status    Duane Ortiz is a 69 y.o. male with a history of tobacco abuse, CKD recently started on dialysis T/TH/Sat, hypertension, hypercholesterolemia, & prior PE on Eliquis who presents to the ED with his son for evaluation of AMS. Last known normal Saturday 09/25 following dialysis session.  History is provided by the patient's son who states that the patient seemed okay prior to and following dialysis and this is the last time he was seen normal. He apparently spent the afternoon with a friend who thought he was acting abnormally over the weekend and then this AM patient's son found him to be very confused. He states the patient was downstairs in his underwear, was confused about his location, and is having trouble communicating. No alleviating/aggravating factors. He is concerned patient is having a stroke. Patient is normally alert & oriented with ability to hold conversation. No recent known trauma. Level 5 Caveat secondary to AMS.   HPI     Past Medical History:  Diagnosis Date  . Arthritis   . Chronic kidney disease   . Chronic kidney disease   . Diabetes ([place]) 02/012017  . Fainting   . Gait abnormality 10/11/2016  . GERD (gastroesophageal reflux disease)   . Gout   . Headache   . Hypercholesteremia   . Hypercholesterolemia   . Hypertension   . Peripheral neuropathy 04/02/2019  . Pulmonary embolism ([place])   . Shortness of breath   . Sleep apnea    does wear cpap  . Wears dentures     Patient Active Problem List   Diagnosis Date Noted  . ESRD (end stage renal disease) ([place]) 09/21/2019  . Hyperkalemia, diminished renal excretion 09/21/2019  . Anemia 08/28/2019  . Disorder of mineral metabolism, unspecified 08/28/2019  . Hyperkalemia 08/28/2019  . Hypomagnesemia 08/28/2019  .  Metabolic acidosis 96/28/3662  . Mycosis 08/28/2019  . Tinea pedis 08/28/2019  . Peripheral neuropathy 04/02/2019  . Coagulation defect ([place]) 07/12/2018  . History of pulmonary embolism 07/12/2018  . Insomnia 07/12/2018  . Lumbar disc disease with radiculopathy 07/12/2018  . GERD (gastroesophageal reflux disease) 04/24/2018  . 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 Allergic rhinitis 03/01/2018  . Unstable gait 03/01/2018  . Chronic lower back pain 02/13/2018  . Gout involving toe of left foot 02/13/2018  . History of alcohol abuse 02/13/2018  . Overweight (BMI 25.0-29.9) 02/13/2018  . Vitamin D deficiency 02/13/2018  . Essential hypertension 04/27/2017  . 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 Gait abnormality 10/11/2016  . Diabetic peripheral neuropathy ([place]) 08/25/2016  . Disc degeneration, lumbar 08/25/2016  . 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 Dyspnea on exertion 05/22/2015  . Hypercholesteremia   . Gout   . Pulmonary embolism ([place])   . Chronic kidney disease   . Hypercholesterolemia     Past Surgical History:  Procedure Laterality Date  . A/V SHUNTOGRAM N/A 09/16/2019   Procedure: A/V SHUNTOGRAM - Left Arm;  Surgeon: Waynetta Sandy, MD;  Location: [facility];  Service: Cardiovascular;  Laterality: N/A;  . AV FISTULA PLACEMENT Left 08/15/2019   Procedure: LEFT ARM ARTERIOVENOUS GORE-TEX GRAFT;  Surgeon: Serafina Mitchell, MD;  Location: [facility];  Service: Vascular;  Laterality: Left;  . CERVICAL DISCECTOMY    . COLONOSCOPY    . MULTIPLE TOOTH EXTRACTIONS    . neck spine disk surgery  01/03/1993  . PERIPHERAL VASCULAR INTERVENTION Left 09/16/2019   Procedure: PERIPHERAL VASCULAR INTERVENTION;  Surgeon: Waynetta Sandy, MD;  Location: [facility];  Service: Cardiovascular;  Laterality: Left;  ARM FISTULS  . WRIST SURGERY          Family History  Problem Relation Age of Onset  . Diabetes Mother   . Heart disease Mother   . Hypertension Mother   . Diabetes Sister   . Hypertension Sister   . Diabetes Brother   . Hypertension Brother   . Breast cancer Paternal Grandmother     Social History   Tobacco Use  . Smoking status: Current Some Day Smoker    Packs/day: 0.25    Types: Cigarettes  . Smokeless tobacco: Never Used  Vaping Use  . Vaping Use: Never used  Substance Use Topics  . Alcohol use: Not Currently    Alcohol/week: 0.0 standard drinks  . Drug use: No    Home Medications Prior to Admission medications   Medication Sig Start Date End Date Taking? Authorizing Provider  allopurinol (ZYLOPRIM) 100 MG tablet Take 100 mg by mouth daily.    [provider]  amLODipine (NORVASC) 5 MG tablet Take 1 tablet (5 mg total) by mouth daily. 09/25/19   Annita Brod, MD  apixaban (ELIQUIS) 5 MG TABS tablet Take 1 tablet (5 mg total) by mouth 2 (two) times daily. 09/24/19   Annita Brod, MD  baclofen (LIORESAL) 10 MG tablet Take 10 mg by mouth daily as needed for muscle spasms.  04/27/18   [provider]  calcitRIOL (ROCALTROL) 0.25 MCG capsule Take 0.25 mcg by mouth daily.  01/31/19   [provider]  cloNIDine (CATAPRES) 0.1 MG tablet Take 1 tablet (0.1 mg total) by mouth 3 (three) times daily. 09/24/19   Annita Brod, MD  ergocalciferol (VITAMIN D2) 1.25 MG (50000 UT) capsule Take 50,000 Units by mouth once a week.  08/02/18   [provider]  ferric citrate (AURYXIA) 1 GM 210 MG(Fe) tablet Take 210 mg by mouth 3 (three) times daily with meals.     [provider]  hydrALAZINE (APRESOLINE) 25 MG tablet Take 1 tablet (25 mg total) by mouth 3 (three) times daily. 09/24/19   Annita Brod, MD  nebivolol (BYSTOLIC) 10 MG tablet Take 10 mg by mouth at bedtime.     [provider]  oxyCODONE-acetaminophen (PERCOCET) 5-325 MG tablet Take 1  tablet by mouth every 4 (four) hours as needed for severe pain. 08/15/19 08/14/20  Serafina Mitchell, MD  sodium bicarbonate 650 MG tablet Take 650 mg by mouth 2 (two) times daily.     [provider]  tamsulosin (FLOMAX) 0.4 MG CAPS capsule Take 0.4 mg by mouth at bedtime.     [provider]  VITAMIN E PO Take 1 capsule by mouth daily.    [provider]    Allergies    Gabapentin and Sulfa antibiotics  Review of Systems   Review of Systems  Unable to perform ROS: Mental status change    Physical Exam Updated Vital Signs BP (!) 165/72 (BP Location: Right Arm)   Pulse 63   Temp 98.2 F (36.8 C) (Oral)   Resp 20   SpO2 98%   Physical Exam Vitals and nursing note reviewed.  Constitutional:      General: He is not in acute distress. HENT:     Head: Normocephalic and atraumatic.     Comments: No racoon eyes or battle sign.     Mouth/Throat:     Comments: Uvula midline.  Eyes:     Pupils: Pupils are equal, round, and reactive to light.  Cardiovascular:     Rate and Rhythm: Normal rate and regular rhythm.  Pulmonary:     Effort: Pulmonary effort is normal. No respiratory distress.     Breath sounds: Normal breath sounds. No wheezing, rhonchi or rales.  Abdominal:     Palpations: Abdomen is soft.     Tenderness: There is no abdominal tenderness. There is no guarding or rebound.  Musculoskeletal:     Comments: Trace symmetric pitting edema to the lower legs.   Skin:    General: Skin is warm and dry.  Neurological:     Mental Status: He is alert.     Comments: Patient is awake & alert. He speech seems mildly slurred with a degree of aphasia. He is oriented to person, disoriented to time (states the year is 1970 something), and is unsure of location when asked. He does not consistently follow commands in terms of more detailed neurologic assessment, when I lift his UEs up the LUE is lowered more quickly than RUE. He does withdrawal extremities from pain  x 4.     ED Results / Procedures / Treatments   Labs (all labs ordered are listed, but only abnormal results are displayed) Labs Reviewed  APTT - Abnormal; Notable for the following components:      Result Value   aPTT 37 (*)    All other components within normal limits  CBC - Abnormal; Notable for the following components:   RBC 3.60 (*)    Hemoglobin 9.9 (*)    HCT 34.6 (*)    MCHC 28.6 (*)    nRBC 0.7 (*)    All other components within normal limits  DIFFERENTIAL - Abnormal; Notable for the following components:   Abs Immature Granulocytes 0.09 (*)    All other components within normal limits  COMPREHENSIVE METABOLIC PANEL - Abnormal; Notable for the following components:   BUN 44 (*)    Creatinine, Ser 9.25 (*)    GFR calc non Af Amer 5 (*)    GFR calc Af Amer 6 (*)    Anion gap 20 (*)    All other components within normal limits  PROTIME-INR  I-STAT CHEM 8, ED  CBG MONITORING, ED    EKG EKG Interpretation  Date/Time:  Monday September 30 2019 12:02:22 EDT Ventricular Rate:  68 PR Interval:  246 QRS Duration: 80 QT Interval:  418 QTC Calculation: 444 R Axis:   11 Text Interpretation: Sinus rhythm with 1st degree A-V block Otherwise normal ECG Confirmed by Lennice Sites [phone]) on 09/30/2019 12:33:42 PM   Radiology CT HEAD WO CONTRAST  Result Date: 09/30/2019 CLINICAL DATA:  Neuro deficit, acute, stroke suspected. Additional history provided: Patient presents with stroke like symptoms, repeating his name and unable to follow commands. EXAM: CT HEAD WITHOUT CONTRAST TECHNIQUE: Contiguous axial images were obtained from the base of the skull through the vertex without intravenous contrast. COMPARISON:  No pertinent prior exams are available for comparison. FINDINGS: Brain: Mild-to-moderate generalized cerebral atrophy. Advanced ill-defined hypoattenuation within the cerebral white matter which is nonspecific, but consistent with chronic small vessel ischemic disease.  There is no acute intracranial hemorrhage.  No demarcated cortical infarct. No extra-axial fluid collection. No evidence of intracranial mass. No midline shift. Vascular: No hyperdense vessel. Skull: Normal. Negative for fracture or focal lesion. Sinuses/Orbits: Visualized orbits show no acute finding. No significant paranasal sinus disease or mastoid effusion at the imaged levels. Other: Presumed chronic fracture deformity of the left nasal bone without appreciable overlying soft tissue swelling (series 4, image 10). IMPRESSION: No CT evidence of acute intracranial abnormality. Advanced cerebral white matter chronic small vessel ischemic disease. Mild-to-moderate generalized cerebral atrophy. A fracture deformity of the left nasal bone is presumed chronic. However, clinical correlation is recommended. Electronically Signed   By: Kellie Simmering DO   On: 09/30/2019 12:57   DG Chest Port 1 View  Result Date: 09/30/2019 CLINICAL DATA:  Altered mental status EXAM: PORTABLE CHEST 1 VIEW COMPARISON:  05/28/2018 FINDINGS: Hypoventilation. Decreased lung volume with mild atelectasis in the bases. Negative for heart failure or pneumonia. No effusion. Vascular stent in the left upper arm. IMPRESSION: Hypoventilation.  No acute abnormality. Electronically Signed   By: Franchot Gallo M.D.   On: 09/30/2019 13:47    Procedures Procedures (including critical care time)  Medications Ordered in ED Medications  sodium chloride flush (NS) 0.9 % injection 3 mL (has no administration in time range)    ED Course  I have reviewed the triage vital signs and the nursing notes.  Pertinent labs & imaging results that were available during my care of the patient were reviewed by me and considered in my medical decision making (see chart for details).    MDM Rules/Calculators/A&P                         Patient presents to the ED for evaluation of altered mental status with last known normal of 12PM 09/28/19.  On arrival  patient does seem confused with a degree of slurred speech & aphasia. Question LUE weakness when attempting to assess drift, but overall limited neuro exam as patient is not consistently following commands.  Given last known normal > 24 hours prior no code stroke initiated.   Additional history obtained:  Additional history obtained from patient's son & chart review.  Recent admission for acute renal failure started on dialysis.  Per his son has been compliant with dialysis.  Lab Tests:  I reviewed and interpreted labs, which included:  CBC, CMP, PT/INR, APTT, and CBG: Fairly unremarkable, anemia similar to prior, worsening renal function is on dialysis.  Imaging Studies ordered:  CT head was ordered per triage protocol, I independently visualized and interpreted imaging which showed no CT evidence of acute intracranial abnormality. Advanced cerebral white matter chronic small vessel ischemic disease. Mild-to-moderate generalized cerebral atrophy.   Additional blood work and chest x-ray ordered.  Will discuss with neurology regarding advanced imaging--> Discussed with neurology team in passing recommended MRI brain wo and MRA for further assessment, call back after MRI.   CXR: Hypoventilation.  No acute abnormality  Patient care signed out to oncoming ED team @ change of shift pending MRI, additional labs, & disposition.   Findings and plan of care discussed with supervising physician Dr. Ronnald Nian who has evaluated patient as shared visit & is in agreement.   Portions of this note were generated with [employer]. Dictation errors may occur despite best attempts at proofreading.  Final Clinical Impression(s) / ED Diagnoses Final diagnoses:  None    Rx / DC Orders ED Discharge Orders    None  Amaryllis Dyke, PA-C 09/30/19 1507    Lennice Sites, DO 09/30/19 1523

## 2019-09-30 NOTE — ED Provider Notes (Signed)
3:15 PM Care assumed from Dr. Ronnald Nian.  At time of transfer of care, patient is awaiting results of MRI imaging to further determine etiology of the patient's altered mental status.  As he is not at his mental status baseline and has some new neurologic deficits, plan of care will be to admit after work-up was completed for further evaluation and management.   7:42 PM MRIs were completed showing no evidence of acute intracranial abnormality.  No bleeding, stroke, obstructions, or masses seen.  I reassessed the patient as he is still very confused and altered.  He is unable to tell me the year, where he is, or the president.  Son is in the room says that he lives alone and was able to answer all these questions completely normally on Saturday.  Patient does still have some weakness in left grip strength and left leg raise compared to right on my exam.  He denies any pain or other complaints.  Reviewing the patient's chart it does look like his creatinine and BUN are more elevated than they have been more recently.  Ammonia is not elevated.  His x-ray showed no pneumonia and he was not having any coughing when I was examining him.  He has not been able to urinate as he now uses dialysis.  Due to the continued altered mental status that is new, he will need admission.  Will call both hospitalist for admission as well as nephrology as he may need dialysis as I am concerned that uremia and his kidneys may be the source of his altered mental status.  We will call them for possible dialysis.     Quamaine Webb, Gwenyth Allegra, MD 09/30/19 [phone]

## 2019-09-30 NOTE — ED Provider Notes (Signed)
Medical screening examination/treatment/procedure(s) were conducted as a shared visit with non-physician practitioner(s) and myself.  I personally evaluated the patient during the encounter. Briefly, the patient is a 69 y.o. male with history of PE on Eliquis, CKD recently started hemodialysis, high cholesterol, diabetes who presents to the ED with altered mental status.  Last seen normal 2 days ago.  Patient confused having difficulty following commands.  Had dialysis on Saturday.  Has not had any falls that family is aware of.  Overall neurologically his exam is nonfocal.  It is hard to tell if he has left-sided neglect or if he is confused as he does not follow commands.  He appears to have some difficulty with speech but he is able to identify my pen.  He follows some commands but not very well.  He appears to maybe have some left-sided weakness compared to the right.  Overall difficult exam and certainly concerning for possibly a stroke.  Has had lab work and head CT already that are fairly unremarkable.  We will get an MRI and MRA of the brain for further stroke evaluation.  Could be secondary to new medications/new dialysis.  Patient signed out to oncoming ED staff with patient pending MRI.  Please see their note for further results, evaluation, disposition of the patient.    This chart was dictated using voice recognition software.  Despite best efforts to proofread,  errors can occur which can change the documentation meaning.     EKG Interpretation  Date/Time:  Monday September 30 2019 12:02:22 EDT Ventricular Rate:  68 PR Interval:  246 QRS Duration: 80 QT Interval:  418 QTC Calculation: 444 R Axis:   11 Text Interpretation: Sinus rhythm with 1st degree A-V block Otherwise normal ECG Confirmed by Lennice Sites [phone]) on 09/30/2019 12:33:42 PM           Lennice Sites, DO 09/30/19 1442

## 2019-09-30 NOTE — Consult Note (Signed)
[facility]  INPATIENT CONSULTATION  Reason for Consultation: AMS, ESRD Requesting Provider: Dr. Sherry Ruffing [facility] ED  HPI: Duane Ortiz is an 69 y.o. male with ESRD started HD 09/2019, DM, GERD, HL, HTN, h/o PE on anticoagulation who is seen for AMS in setting of ESRD.   Recently hospitalized 09/21/19 for new start HD (followed with [facility] for CKD care) at [facility] -- 9/18-9/21/21.  Had 1st outpt HD on Sat which by report went ok.  He subsequently developed AMS and was brought to [facility] ED today -- CT ok, concern for L neglect vs confusion so MRI/A obtained - was ok.  EtOH neg.  No known falls.    Pt unable to provide any history tonight and son did not answer at 8:45p call.    PMH: Past Medical History:  Diagnosis Date  . Arthritis   . Chronic kidney disease   . Chronic kidney disease   . Diabetes ([place]) 02/012017  . Fainting   . Gait abnormality 10/11/2016  . GERD (gastroesophageal reflux disease)   . Gout   . Headache   . Hypercholesteremia   . Hypercholesterolemia   . Hypertension   . Peripheral neuropathy 04/02/2019  . Pulmonary embolism ([place])   . Shortness of breath   . Sleep apnea    does wear cpap  . Wears dentures    PSH: Past Surgical History:  Procedure Laterality Date  . A/V SHUNTOGRAM N/A 09/16/2019   Procedure: A/V SHUNTOGRAM - Left Arm;  Surgeon: Waynetta Sandy, MD;  Location: [place] [facility];  Service: Cardiovascular;  Laterality: N/A;  . AV FISTULA PLACEMENT Left 08/15/2019   Procedure: LEFT ARM ARTERIOVENOUS GORE-TEX GRAFT;  Surgeon: Serafina Mitchell, MD;  Location: [facility] OR;  Service: Vascular;  Laterality: Left;  . CERVICAL DISCECTOMY    . COLONOSCOPY    . MULTIPLE TOOTH EXTRACTIONS    . neck spine disk surgery  01/03/1993  . PERIPHERAL VASCULAR INTERVENTION Left 09/16/2019   Procedure: PERIPHERAL VASCULAR INTERVENTION;  Surgeon: Waynetta Sandy, MD;  Location: [place] [facility];  Service: Cardiovascular;  Laterality: Left;   ARM FISTULS  . WRIST SURGERY      Past Medical History:  Diagnosis Date  . Arthritis   . Chronic kidney disease   . Chronic kidney disease   . Diabetes ([place]) 02/012017  . Fainting   . Gait abnormality 10/11/2016  . GERD (gastroesophageal reflux disease)   . Gout   . Headache   . Hypercholesteremia   . Hypercholesterolemia   . Hypertension   . Peripheral neuropathy 04/02/2019  . Pulmonary embolism ([place])   . Shortness of breath   . Sleep apnea    does wear cpap  . Wears dentures     Medications:  I have reviewed the patient's current medications.  (Not in a hospital admission)   ALLERGIES:   Allergies  Allergen Reactions  . Gabapentin     Felt like I was out of my head, slept all day, confusion   . Sulfa Antibiotics Itching    FAM HX: Family History  Problem Relation Age of Onset  . Diabetes Mother   . Heart disease Mother   . Hypertension Mother   . Diabetes Sister   . Hypertension Sister   . Diabetes Brother   . Hypertension Brother   . Breast cancer Paternal Grandmother     Social History:   reports that he has been smoking cigarettes. He has been smoking about 0.25 packs  per day. He has never used smokeless tobacco. He reports previous alcohol use. He reports that he does not use drugs.  ROS: unable to obtain from pt with AM  Blood pressure (!) 197/77, pulse 65, temperature 98.2 F (36.8 C), temperature source Oral, resp. rate 16, SpO2 100 %. PHYSICAL EXAM: Gen: elderly man lying in bed in no distress  Eyes: EOMI, looking around ENT: MMM Neck: no JVD CV:  RRR, no rub Abd: soft, mild TTP with pt moaning but no rebound or guarding Lungs: normal WOB on RA, CTAB. GU: no foley Extr:  Trace tibial edema, LUE AVG +t/b, removed tape/gauze from Sat HD with no bleeding noted Neuro: will look at me but non verbal except moaning, not following commands Skin: no rashes or lesions, warm and dry   Results for orders placed or performed during the hospital  encounter of 09/30/19 (from the past 48 hour(s))  CBG monitoring, ED     Status: None   Collection Time: 09/30/19 11:57 AM  Result Value Ref Range   Glucose-Capillary 96 70 - 99 mg/dL    Comment: Glucose reference range applies only to samples taken after fasting for at least 8 hours.  Protime-INR     Status: None   Collection Time: 09/30/19 12:04 PM  Result Value Ref Range   Prothrombin Time 14.7 11.4 - 15.2 seconds   INR 1.2 0.8 - 1.2    Comment: (NOTE) INR goal varies based on device and disease states. Performed at [facility], [place] [address] [place], [place] [address]   APTT     Status: Abnormal   Collection Time: 09/30/19 12:04 PM  Result Value Ref Range   aPTT 37 (H) 24 - 36 seconds    Comment:        IF BASELINE aPTT IS ELEVATED, SUGGEST PATIENT RISK ASSESSMENT BE USED TO DETERMINE APPROPRIATE ANTICOAGULANT THERAPY. Performed at [facility], [place] [address] [place], [place] [address]   CBC     Status: Abnormal   Collection Time: 09/30/19 12:04 PM  Result Value Ref Range   WBC 9.4 4.0 - 10.5 K/uL   RBC 3.60 (L) 4.22 - 5.81 MIL/uL   Hemoglobin 9.9 (L) 13.0 - 17.0 g/dL   HCT 34.6 (L) 39 - 52 %   MCV 96.1 80.0 - 100.0 fL   MCH 27.5 26.0 - 34.0 pg   MCHC 28.6 (L) 30.0 - 36.0 g/dL   RDW 13.5 11.5 - 15.5 %   Platelets 210 150 - 400 K/uL   nRBC 0.7 (H) 0.0 - 0.2 %    Comment: Performed at [facility] [address], [place], [place] [address]  Differential     Status: Abnormal   Collection Time: 09/30/19 12:04 PM  Result Value Ref Range   Neutrophils Relative % 64 %   Neutro Abs 5.9 1.7 - 7.7 K/uL   Lymphocytes Relative 24 %   Lymphs Abs 2.3 0.7 - 4.0 K/uL   Monocytes Relative 10 %   Monocytes Absolute 1.0 0 - 1 K/uL   Eosinophils Relative 1 %   Eosinophils Absolute 0.1 0 - 0 K/uL   Basophils Relative 0 %   Basophils Absolute 0.0 0 - 0 K/uL   Immature Granulocytes 1 %   Abs Immature Granulocytes 0.09 (H) 0.00 - 0.07 K/uL    Comment:  Performed at [facility] [address] [place], [place] [address]  Comprehensive metabolic panel  Status: Abnormal   Collection Time: 09/30/19 12:04 PM  Result Value Ref Range   Sodium 140 135 - 145 mmol/L   Potassium 4.0 3.5 - 5.1 mmol/L   Chloride 98 98 - 111 mmol/L   CO2 22 22 - 32 mmol/L   Glucose, Bld 98 70 - 99 mg/dL    Comment: Glucose reference range applies only to samples taken after fasting for at least 8 hours.   BUN 44 (H) 8 - 23 mg/dL   Creatinine, Ser 9.25 (H) 0.61 - 1.24 mg/dL   Calcium 8.9 8.9 - 10.3 mg/dL   Total Protein 7.6 6.5 - 8.1 g/dL   Albumin 3.9 3.5 - 5.0 g/dL   AST 22 15 - 41 U/L   ALT 20 0 - 44 U/L   Alkaline Phosphatase 59 38 - 126 U/L   Total Bilirubin 0.7 0.3 - 1.2 mg/dL   GFR calc non Af Amer 5 (L) >60 mL/min   GFR calc Af Amer 6 (L) >60 mL/min   Anion gap 20 (H) 5 - 15    Comment: Performed at [facility] [address], [place], [place] [address]  I-stat chem 8, ED     Status: Abnormal   Collection Time: 09/30/19  2:17 PM  Result Value Ref Range   Sodium 136 135 - 145 mmol/L   Potassium 3.8 3.5 - 5.1 mmol/L   Chloride 101 98 - 111 mmol/L   BUN 47 (H) 8 - 23 mg/dL   Creatinine, Ser 9.90 (H) 0.61 - 1.24 mg/dL   Glucose, Bld 87 70 - 99 mg/dL    Comment: Glucose reference range applies only to samples taken after fasting for at least 8 hours.   Calcium, Ion 0.97 (L) 1.15 - 1.40 mmol/L   TCO2 24 22 - 32 mmol/L   Hemoglobin 11.6 (L) 13.0 - 17.0 g/dL   HCT 34.0 (L) 39 - 52 %  Ammonia     Status: None   Collection Time: 09/30/19  3:04 PM  Result Value Ref Range   Ammonia 28 9 - 35 umol/L    Comment: Performed at [facility], [place] [address], [place], [place] [address]  Ethanol     Status: None   Collection Time: 09/30/19  3:04 PM  Result Value Ref Range   Alcohol, Ethyl (B) <10 <10 mg/dL    Comment: (NOTE) Lowest detectable limit for serum alcohol is 10 mg/dL.  For medical purposes only. Performed at [facility], [place] [address] [place], [place] [address]   CBG monitoring, ED     Status: None   Collection Time: 09/30/19  3:07 PM  Result Value Ref Range   Glucose-Capillary 89 70 - 99 mg/dL    Comment: Glucose reference range applies only to samples taken after fasting for at least 8 hours.    CT HEAD WO CONTRAST  Result Date: 09/30/2019 CLINICAL DATA:  Neuro deficit, acute, stroke suspected. Additional history provided: Patient presents with stroke like symptoms, repeating his name and unable to follow commands. EXAM: CT HEAD WITHOUT CONTRAST TECHNIQUE: Contiguous axial images were obtained from the base of the skull through the vertex without intravenous contrast. COMPARISON:  No pertinent prior exams are available for comparison. FINDINGS: Brain: Mild-to-moderate generalized cerebral atrophy. Advanced ill-defined hypoattenuation within the cerebral white matter which is nonspecific, but consistent with chronic small vessel ischemic disease. There is no acute intracranial hemorrhage. No demarcated cortical infarct. No extra-axial fluid collection. No evidence of  intracranial mass. No midline shift. Vascular: No hyperdense vessel. Skull: Normal. Negative for fracture or focal lesion. Sinuses/Orbits: Visualized orbits show no acute finding. No significant paranasal sinus disease or mastoid effusion at the imaged levels. Other: Presumed chronic fracture deformity of the left nasal bone without appreciable overlying soft tissue swelling (series 4, image 10). IMPRESSION: No CT evidence of acute intracranial abnormality. Advanced cerebral white matter chronic small vessel ischemic disease. Mild-to-moderate generalized cerebral atrophy. A fracture deformity of the left nasal bone is presumed chronic. However, clinical correlation is recommended. Electronically Signed   By: Kellie Simmering DO   On: 09/30/2019 12:57   DG Chest Port 1 View  Result Date: 09/30/2019 CLINICAL DATA:  Altered mental status EXAM:  PORTABLE CHEST 1 VIEW COMPARISON:  05/28/2018 FINDINGS: Hypoventilation. Decreased lung volume with mild atelectasis in the bases. Negative for heart failure or pneumonia. No effusion. Vascular stent in the left upper arm. IMPRESSION: Hypoventilation.  No acute abnormality. Electronically Signed   By: Franchot Gallo M.D.   On: 09/30/2019 13:47   HD orders: NW TTS 180 BFR 400 DFR 800 2K/2.5Ca  EDW 85.5kg, post wt Thurs 87, post wt Sat 89.5kg  Assessment/Plan **AMS: eval to date largely unrevealing.  This is not consistent with dialysis disequilibrium or uremia.  I suspect it's baclofen toxicity with 10mg  dose on his med list, but I was unable to reach son tonight to confirm he took it.  Baclofen is dialyzable and I will prioritize a dialysis treatment for him.   A COVID test will be needed which I discussed with the ED provider.  If no improvement with HD and urine tox neg would involve neurology.  **ESRD on HD: newly started HD last week outpt after initiating at [facility].  HD overnight if possible, o/w 1st thing in AM.     **Anemia: Hb 11.6, no need for acute intervention.   **HTN:  Quite HTN this PM and does not appear safe to take meds.  Home meds include amlodipine 5 daily, clonidine 0.1 TID, nebivolol 10 daily.  IV hydralazine PRN for now.  UF to euvolemia with HD - doesn't look too volume up today, will trial 1L with HD.   **secondary hyperparathyroidism:  calctriol 0.25 daily when able.  On auryxia TID AC for hyperphos.   **h/o PE: on eliquis now.   Justin Mend 09/30/2019, 8:42 PM

## 2019-09-30 NOTE — ED Notes (Signed)
Patient urinates so he is aware we need a urine

## 2019-09-30 NOTE — ED Notes (Signed)
Son at bedside.

## 2019-09-30 NOTE — ED Notes (Signed)
Patient returned from MRI.

## 2019-09-30 NOTE — ED Notes (Signed)
Pt has recently started on dialysis

## 2019-09-30 NOTE — ED Notes (Signed)
Dr. Sherry Ruffing at bedside evaluating patient and explained plan of care to patient and son .

## 2019-09-30 NOTE — H&P (Addendum)
History and Physical    Duane Ortiz QMG:[id number] DOB: [birth year] DOA: 09/30/2019  PCP: Sandi Mariscal, MD  Patient coming from: Home  I have personally briefly reviewed patient's old medical records in [place]  Chief Complaint: AMS  HPI: Duane Ortiz is a 69 y.o. 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  Pt recently admitted to hospital 9/17-9/21 to start dialysis.  During that admit: Xarelto was changed to eliquis, BP medications were changed (increased) due to uncontrolled HTN.  Pt presents to ED today with confusion.  Pt LKW 2 days ago.  Last dialysis was on Sat.  No falls that family is aware of.  Per pt son report to EDP: He apparently spent the afternoon with a friend who thought he was acting abnormally over the weekend and then this AM patient's son found him to be very confused. He states the patient was downstairs in his underwear, was confused about his location, and is having trouble communicating. No alleviating/aggravating factors.   ED Course: BP 193/80 currently (not unusual for patient).  Stroke work up including MRI brain is neg for acute stroke.  BUN 47 (down from 91 on last admit).  Ammonia 28.  CXR neg.  No SIRS.  Nephrology suspects Baclofen toxicity.  COVID test pending.  UDS pending.   Review of Systems: Unable to perform due to AMS  Past Medical History:  Diagnosis Date  . Arthritis   . Chronic kidney disease   . Chronic kidney disease   . Diabetes ([place]) 02/012017  . Fainting   . Gait abnormality 10/11/2016  . GERD (gastroesophageal reflux disease)   . Gout   . Headache   . Hypercholesteremia   . Hypercholesterolemia   . Hypertension   . Peripheral neuropathy 04/02/2019  . Pulmonary embolism ([place])   . Shortness of breath   . Sleep apnea    does wear cpap  . Wears dentures     Past Surgical History:  Procedure Laterality Date  .  A/V SHUNTOGRAM N/A 09/16/2019   Procedure: A/V SHUNTOGRAM - Left Arm;  Surgeon: Waynetta Sandy, MD;  Location: [facility];  Service: Cardiovascular;  Laterality: N/A;  . AV FISTULA PLACEMENT Left 08/15/2019   Procedure: LEFT ARM ARTERIOVENOUS GORE-TEX GRAFT;  Surgeon: Serafina Mitchell, MD;  Location: [facility];  Service: Vascular;  Laterality: Left;  . CERVICAL DISCECTOMY    . COLONOSCOPY    . MULTIPLE TOOTH EXTRACTIONS    . neck spine disk surgery  01/03/1993  . PERIPHERAL VASCULAR INTERVENTION Left 09/16/2019   Procedure: PERIPHERAL VASCULAR INTERVENTION;  Surgeon: Waynetta Sandy, MD;  Location: [facility];  Service: Cardiovascular;  Laterality: Left;  ARM FISTULS  . WRIST SURGERY       reports that he has been smoking cigarettes. He has been smoking about 0.25 packs per day. He has never used smokeless tobacco. He reports previous alcohol use. He reports that he does not use drugs.  Allergies  Allergen Reactions  . Gabapentin     Felt like I was out of my head, slept all day, confusion   . Sulfa Antibiotics Itching    Family History  Problem Relation Age of Onset  . Diabetes Mother   . Heart disease Mother   . Hypertension Mother   . Diabetes Sister   . Hypertension Sister   . Diabetes Brother   . Hypertension Brother   .  Breast cancer Paternal Grandmother      Prior to Admission medications   Medication Sig Start Date End Date Taking? Authorizing Provider  allopurinol (ZYLOPRIM) 100 MG tablet Take 100 mg by mouth daily.    [provider]  amLODipine (NORVASC) 5 MG tablet Take 1 tablet (5 mg total) by mouth daily. 09/25/19   Annita Brod, MD  apixaban (ELIQUIS) 5 MG TABS tablet Take 1 tablet (5 mg total) by mouth 2 (two) times daily. 09/24/19   Annita Brod, MD  calcitRIOL (ROCALTROL) 0.25 MCG capsule Take 0.25 mcg by mouth daily.  01/31/19   [provider]  cloNIDine (CATAPRES) 0.1 MG tablet Take 1 tablet (0.1 mg  total) by mouth 3 (three) times daily. 09/24/19   Annita Brod, MD  ergocalciferol (VITAMIN D2) 1.25 MG (50000 UT) capsule Take 50,000 Units by mouth once a week.  08/02/18   [provider]  ferric citrate (AURYXIA) 1 GM 210 MG(Fe) tablet Take 210 mg by mouth 3 (three) times daily with meals.     [provider]  hydrALAZINE (APRESOLINE) 25 MG tablet Take 1 tablet (25 mg total) by mouth 3 (three) times daily. 09/24/19   Annita Brod, MD  nebivolol (BYSTOLIC) 10 MG tablet Take 10 mg by mouth at bedtime.     [provider]  oxyCODONE-acetaminophen (PERCOCET) 5-325 MG tablet Take 1 tablet by mouth every 4 (four) hours as needed for severe pain. 08/15/19 08/14/20  Serafina Mitchell, MD  sodium bicarbonate 650 MG tablet Take 650 mg by mouth 2 (two) times daily.     [provider]  tamsulosin (FLOMAX) 0.4 MG CAPS capsule Take 0.4 mg by mouth at bedtime.     [provider]  VITAMIN E PO Take 1 capsule by mouth daily.    [provider]    Physical Exam: Vitals:   09/30/19 1430 09/30/19 1445 09/30/19 1915 09/30/19 1930  BP: (!) 117/52 (!) 143/85 (!) 176/85 (!) 197/77  Pulse: (!) 57 66 64 65  Resp: (!) 9 (!) 24 (!) 163 16  Temp:      TempSrc:      SpO2: 96% 96% 100% 100%    Constitutional: NAD, calm, comfortable Eyes: PERRL, lids and conjunctivae normal ENMT: Mucous membranes are moist. Posterior pharynx clear of any exudate or lesions.Normal dentition.  Neck: normal, supple, no masses, no thyromegaly Respiratory: clear to auscultation bilaterally, no wheezing, no crackles. Normal respiratory effort. No accessory muscle use.  Cardiovascular: Regular rate and rhythm, no murmurs / rubs / gallops. No extremity edema. 2+ pedal pulses. No carotid bruits.  Abdomen: no tenderness, no masses palpated. No hepatosplenomegaly. Bowel sounds positive.  Musculoskeletal: no clubbing / cyanosis. No joint deformity upper and lower extremities. Good  ROM, no contractures. Normal muscle tone.  Skin: Mild left arm swelling, actually looks maybe slightly less swollen to me than when I admitted him on 9/17. Neurologic: CN 2-12 grossly intact. Sensation intact, DTR normal. Strength 5/5 in all 4.  Psychiatric: Wakes up to voice, answers questions, but is confused: ie. States year is "72".  Does know that he is in "a hospital".   Labs on Admission: I have personally reviewed following labs and imaging studies  CBC: Recent Labs  Lab 09/24/19 0327 09/30/19 1204 09/30/19 1417  WBC 6.9 9.4  --   NEUTROABS  --  5.9  --   HGB 8.0* 9.9* 11.6*  HCT 25.6* 34.6* 34.0*  MCV 90.8 96.1  --  PLT 138* 210  --    Basic Metabolic Panel: Recent Labs  Lab 09/24/19 0327 09/30/19 1204 09/30/19 1417  NA 137 140 136  K 3.8 4.0 3.8  CL 99 98 101  CO2 26 22  --   GLUCOSE 86 98 87  BUN 15 44* 47*  CREATININE 3.81* 9.25* 9.90*  CALCIUM 8.4* 8.9  --    GFR: Estimated Creatinine Clearance: 7.2 mL/min (A) (by C-G formula based on SCr of 9.9 mg/dL (H)). Liver Function Tests: Recent Labs  Lab 09/30/19 1204  AST 22  ALT 20  ALKPHOS 59  BILITOT 0.7  PROT 7.6  ALBUMIN 3.9   No results for input(s): LIPASE, AMYLASE in the last 168 hours. Recent Labs  Lab 09/30/19 1504  AMMONIA 28   Coagulation Profile: Recent Labs  Lab 09/30/19 1204  INR 1.2   Cardiac Enzymes: No results for input(s): CKTOTAL, CKMB, CKMBINDEX, TROPONINI in the last 168 hours. BNP (last 3 results) No results for input(s): PROBNP in the last 8760 hours. HbA1C: No results for input(s): HGBA1C in the last 72 hours. CBG: Recent Labs  Lab 09/23/19 2145 09/24/19 0826 09/24/19 1208 09/30/19 1157 09/30/19 1507  GLUCAP 96 116* 96 96 89   Lipid Profile: No results for input(s): CHOL, HDL, LDLCALC, TRIG, CHOLHDL, LDLDIRECT in the last 72 hours. Thyroid Function Tests: No results for input(s): TSH, T4TOTAL, FREET4, T3FREE, THYROIDAB in the last 72 hours. Anemia  Panel: No results for input(s): VITAMINB12, FOLATE, FERRITIN, TIBC, IRON, RETICCTPCT in the last 72 hours. Urine analysis:    Component Value Date/Time   COLORURINE STRAW (A) 05/28/2018 1010   APPEARANCEUR CLEAR 05/28/2018 1010   LABSPEC 1.010 05/28/2018 1010   PHURINE 6.0 05/28/2018 1010   GLUCOSEU NEGATIVE 05/28/2018 1010   HGBUR NEGATIVE 05/28/2018 1010   BILIRUBINUR NEGATIVE 05/28/2018 1010   KETONESUR NEGATIVE 05/28/2018 1010   PROTEINUR 100 (A) 05/28/2018 1010   NITRITE NEGATIVE 05/28/2018 1010   LEUKOCYTESUR NEGATIVE 05/28/2018 1010    Radiological Exams on Admission: CT HEAD WO CONTRAST  Result Date: 09/30/2019 CLINICAL DATA:  Neuro deficit, acute, stroke suspected. Additional history provided: Patient presents with stroke like symptoms, repeating his name and unable to follow commands. EXAM: CT HEAD WITHOUT CONTRAST TECHNIQUE: Contiguous axial images were obtained from the base of the skull through the vertex without intravenous contrast. COMPARISON:  No pertinent prior exams are available for comparison. FINDINGS: Brain: Mild-to-moderate generalized cerebral atrophy. Advanced ill-defined hypoattenuation within the cerebral white matter which is nonspecific, but consistent with chronic small vessel ischemic disease. There is no acute intracranial hemorrhage. No demarcated cortical infarct. No extra-axial fluid collection. No evidence of intracranial mass. No midline shift. Vascular: No hyperdense vessel. Skull: Normal. Negative for fracture or focal lesion. Sinuses/Orbits: Visualized orbits show no acute finding. No significant paranasal sinus disease or mastoid effusion at the imaged levels. Other: Presumed chronic fracture deformity of the left nasal bone without appreciable overlying soft tissue swelling (series 4, image 10). IMPRESSION: No CT evidence of acute intracranial abnormality. Advanced cerebral white matter chronic small vessel ischemic disease. Mild-to-moderate generalized  cerebral atrophy. A fracture deformity of the left nasal bone is presumed chronic. However, clinical correlation is recommended. Electronically Signed   By: Kellie Simmering DO   On: 09/30/2019 12:57   DG Chest Port 1 View  Result Date: 09/30/2019 CLINICAL DATA:  Altered mental status EXAM: PORTABLE CHEST 1 VIEW COMPARISON:  05/28/2018 FINDINGS: Hypoventilation. Decreased lung volume with mild atelectasis in the bases. Negative  for heart failure or pneumonia. No effusion. Vascular stent in the left upper arm. IMPRESSION: Hypoventilation.  No acute abnormality. Electronically Signed   By: Franchot Gallo M.D.   On: 09/30/2019 13:47    EKG: Independently reviewed.  Assessment/Plan Principal Problem:   Acute encephalopathy Active Problems:   Essential hypertension   Coagulation defect ([place])   ESRD (end stage renal disease) ([place])    1. Acute encephalopathy - 1. See also nephrology consult note 2. ? Baclofen toxicity as cause? 1. Looks like they want to take him to HD 2. Stop baclofen 3. UDS pending 4. Less likely or ruled out by work up thus far: 1. Ammonia is only 28 2. BUN of 44, not likely to cause AMS and he wasn't altered at all when I admitted him last week with a BUN of 91 3. MRI neg for stroke 4. No SIRS to suggest infection at this point. 5. BP 122Q systolic today in ED, but this also baseline and chronic for patient (was 190s or more when I admitted last week without AMS).  HTN encephalopathy seems less likely. 5. If UDS neg, and no improvement post dialysis, next step will be neuro consult. 2. HTN - 1. PRN hydralazine ordered 2. If able to wake up and take PO meds, have re-ordered home PO meds 3. H/o PE - on chronic anticoagulation 1. Cont eliquis 4. ESRD - 1. See nephrology consult note  DVT prophylaxis: Eliquis Code Status: Full Family Communication: No family in room Disposition Plan: Home after AMS resolved Consults called: Nephrology Admission status: Place in  13    Leeta Grimme, [place] [facility]  How to contact the [facility] Attending or Consulting provider [place] or covering provider during after hours [place], for this patient?  1. Check the care team in [facility] and look for a) attending/consulting [facility] provider listed and b) the [facility] team listed 2. Log into [email or web address]  Amion Physician Scheduling and messaging for groups and whole hospitals  On call and physician scheduling software for group practices, residents, hospitalists and other medical providers for call, clinic, rotation and shift schedules. On[employer] is a hospital-wide system for scheduling doctors and paging doctors on call. EasyPlot is for scientific plotting and data analysis.  [email or web address]  and use [place]'s universal password to access. If you do not have the password, please contact the hospital operator.  3. Locate the [facility] provider you are looking for under [facility] and page to a number that you can be directly reached. 4. If you still have difficulty reaching the provider, please page the [facility] (Director on Call) for the Hospitalists listed on amion for assistance.  09/30/2019, 8:54 PM

## 2019-09-30 NOTE — ED Triage Notes (Signed)
Pt  Here from home with stroke like symptoms keeps just repeating his name and unable to follow any commands , cbg 96 , pt lsn Saturday

## 2019-10-01 ENCOUNTER — Other Ambulatory Visit: Payer: Self-pay

## 2019-10-01 DIAGNOSIS — R4182 Altered mental status, unspecified: Secondary | ICD-10-CM

## 2019-10-01 DIAGNOSIS — G92 Toxic encephalopathy: Secondary | ICD-10-CM | POA: Diagnosis not present

## 2019-10-01 DIAGNOSIS — K219 Gastro-esophageal reflux disease without esophagitis: Secondary | ICD-10-CM | POA: Diagnosis not present

## 2019-10-01 DIAGNOSIS — Z86711 Personal history of pulmonary embolism: Secondary | ICD-10-CM | POA: Diagnosis not present

## 2019-10-01 DIAGNOSIS — N2581 Secondary hyperparathyroidism of renal origin: Secondary | ICD-10-CM | POA: Diagnosis not present

## 2019-10-01 DIAGNOSIS — I120 Hypertensive chronic kidney disease with stage 5 chronic kidney disease or end stage renal disease: Secondary | ICD-10-CM | POA: Diagnosis not present

## 2019-10-01 DIAGNOSIS — M6281 Muscle weakness (generalized): Secondary | ICD-10-CM | POA: Diagnosis not present

## 2019-10-01 DIAGNOSIS — R791 Abnormal coagulation profile: Secondary | ICD-10-CM | POA: Diagnosis not present

## 2019-10-01 DIAGNOSIS — F17210 Nicotine dependence, cigarettes, uncomplicated: Secondary | ICD-10-CM | POA: Diagnosis not present

## 2019-10-01 DIAGNOSIS — E1122 Type 2 diabetes mellitus with diabetic chronic kidney disease: Secondary | ICD-10-CM | POA: Diagnosis not present

## 2019-10-01 DIAGNOSIS — N186 End stage renal disease: Secondary | ICD-10-CM | POA: Diagnosis not present

## 2019-10-01 DIAGNOSIS — E7800 Pure hypercholesterolemia, unspecified: Secondary | ICD-10-CM | POA: Diagnosis not present

## 2019-10-01 DIAGNOSIS — Z20822 Contact with and (suspected) exposure to covid-19: Secondary | ICD-10-CM | POA: Diagnosis not present

## 2019-10-01 DIAGNOSIS — T428X5A Adverse effect of antiparkinsonism drugs and other central muscle-tone depressants, initial encounter: Secondary | ICD-10-CM | POA: Diagnosis not present

## 2019-10-01 DIAGNOSIS — Z79899 Other long term (current) drug therapy: Secondary | ICD-10-CM | POA: Diagnosis not present

## 2019-10-01 DIAGNOSIS — G9340 Encephalopathy, unspecified: Secondary | ICD-10-CM | POA: Diagnosis not present

## 2019-10-01 DIAGNOSIS — Z992 Dependence on renal dialysis: Secondary | ICD-10-CM | POA: Diagnosis not present

## 2019-10-01 DIAGNOSIS — R531 Weakness: Secondary | ICD-10-CM | POA: Diagnosis present

## 2019-10-01 DIAGNOSIS — R410 Disorientation, unspecified: Secondary | ICD-10-CM | POA: Diagnosis not present

## 2019-10-01 DIAGNOSIS — I10 Essential (primary) hypertension: Secondary | ICD-10-CM | POA: Diagnosis not present

## 2019-10-01 DIAGNOSIS — D631 Anemia in chronic kidney disease: Secondary | ICD-10-CM | POA: Diagnosis not present

## 2019-10-01 DIAGNOSIS — Z7901 Long term (current) use of anticoagulants: Secondary | ICD-10-CM | POA: Diagnosis not present

## 2019-10-01 HISTORY — DX: Altered mental status, unspecified: R41.82

## 2019-10-01 LAB — BASIC METABOLIC PANEL
Anion gap: 18 — ABNORMAL HIGH (ref 5–15)
BUN: 50 mg/dL — ABNORMAL HIGH (ref 8–23)
CO2: 22 mmol/L (ref 22–32)
Calcium: 8.5 mg/dL — ABNORMAL LOW (ref 8.9–10.3)
Chloride: 98 mmol/L (ref 98–111)
Creatinine, Ser: 9.78 mg/dL — ABNORMAL HIGH (ref 0.61–1.24)
GFR calc Af Amer: 6 mL/min — ABNORMAL LOW (ref >60)
GFR calc non Af Amer: 5 mL/min — ABNORMAL LOW (ref >60)
Glucose, Bld: 88 mg/dL (ref 70–99)
Potassium: 3.2 mmol/L — ABNORMAL LOW (ref 3.5–5.1)
Sodium: 138 mmol/L (ref 135–145)

## 2019-10-01 LAB — CBC
HCT: 31 % — ABNORMAL LOW (ref 39.0–52.0)
Hemoglobin: 9.4 g/dL — ABNORMAL LOW (ref 13.0–17.0)
MCH: 28.7 pg (ref 26.0–34.0)
MCHC: 30.3 g/dL (ref 30.0–36.0)
MCV: 94.5 fL (ref 80.0–100.0)
Platelets: 208 10*3/uL (ref 150–400)
RBC: 3.28 MIL/uL — ABNORMAL LOW (ref 4.22–5.81)
RDW: 13.9 % (ref 11.5–15.5)
WBC: 6.9 10*3/uL (ref 4.0–10.5)
nRBC: 0.4 % — ABNORMAL HIGH (ref 0.0–0.2)

## 2019-10-01 LAB — GLUCOSE, CAPILLARY: Glucose-Capillary: 123 mg/dL — ABNORMAL HIGH (ref 70–99)

## 2019-10-01 MED ORDER — LIDOCAINE 5 % EX PTCH
1.0000 | MEDICATED_PATCH | CUTANEOUS | Status: DC
  Administered 2019-10-01 – 2019-10-02 (×2): 1 via TRANSDERMAL
  Filled 2019-10-01 (×2): qty 1

## 2019-10-01 NOTE — ED Notes (Signed)
Transported to hemodialysis.

## 2019-10-01 NOTE — Care Management Obs Status (Signed)
[place] NOTIFICATION   Patient Details  Name: Duane Ortiz MRN: [id number] Date of Birth: September 21, 1950   Medicare Observation Status Notification Given:  Yes    Joanne Chars, [place] 10/01/2019, 2:14 PM

## 2019-10-01 NOTE — Progress Notes (Signed)
[facility] Progress Note  Name: Bradley Bostelman MRN: [id number] DOB: 11/20/1950  Chief Complaint:  ams  Subjective:  Seen and examined on dialysis.  Blood pressure 131/62 and HR 67; left AVF in use.  Tolerating goal  Review of systems:  Denies shortness of breath or chest pain  Denies n/v  No intake or output data in the 24 hours ending 10/01/19 0745  Vitals:  Vitals:   10/01/19 0552 10/01/19 0650 10/01/19 0705 10/01/19 0710  BP: (!) 164/90 (!) 147/60 (!) 146/63 (!) 142/64  Pulse: 62 66 66 62  Resp: 18 (!) 22 12 15   Temp:  97.9 F (36.6 C)    TempSrc:  Axillary    SpO2: 96% 99%  100%     Physical Exam:  General adult male in bed in no acute distress HEENT normocephalic atraumatic extraocular movements intact sclera anicteric Neck supple trachea midline Lungs clear to auscultation bilaterally normal work of breathing at rest  Heart S1S2 no rub Abdomen soft nontender nondistended Extremities no edema  Psych no anxiety or agitation  Neuro - doesn't know year or where we are. Oriented to person.came because "the doctor wanted me here" Access LUE AVF in use  Medications reviewed   Labs:  BMP Latest Ref Rng & Units 10/01/2019 09/30/2019 09/30/2019  Glucose 70 - 99 mg/dL 88 87 98  BUN 8 - 23 mg/dL 50(H) 47(H) 44(H)  Creatinine 0.61 - 1.24 mg/dL 9.78(H) 9.90(H) 9.25(H)  Sodium 135 - 145 mmol/L 138 136 140  Potassium 3.5 - 5.1 mmol/L 3.2(L) 3.8 4.0  Chloride 98 - 111 mmol/L 98 101 98  CO2 22 - 32 mmol/L 22 - 22  Calcium 8.9 - 10.3 mg/dL 8.5(L) - 8.9    NW TTS 180 BFR 400 DFR 800 2K/2.5Ca  EDW 85.5kg, post wt Thurs 87, post wt Sat 89.5kg  Assessment/Plan:   **AMS: eval to date largely unrevealing.  This is not consistent with dialysis disequilibrium or uremia. May be baclofen toxicity with 10mg  dose on his med list - If no improvement with HD and urine tox neg would involve neurology.  **ESRD on HD:  - HD today and per TTS schedule   **Anemia: Hb  11.6, no need for acute intervention.   **HTN:  follow on current regimen and optimize volume with HD - doesn't appear overloaded    **secondary hyperparathyroidism:  calctriol 0.25 daily when able - will then transition to calcitriol at outpatient HD unit three times a week on discharge.  On auryxia TID AC for hyperphos.   **h/o PE: on eliquis now.   Claudia Desanctis, MD 10/01/2019 7:51 AM

## 2019-10-01 NOTE — Evaluation (Signed)
Physical Therapy Evaluation Patient Details Name: Duane Ortiz MRN: [id number] DOB: 1950-12-04 Today's Date: 10/01/2019   History of Present Illness  69 y.o. male with medical history significant of ESRD patient just started dialysis last week [facility] admission 9/17-9/21), HTN and HTN nephropathy, prior PE. Pt admitted to [facility] on 9/28 with acute encephalopathy suspect secondary to baclofen toxicity. MRI negative for acute changes.  Clinical Impression   Pt presents with generalized weakness, impaired safety awareness, poor standing balance at very high risk of falls presently, impaired gait, and decreased activity tolerance. Pt to benefit from acute PT to address deficits. Pt ambulated room distance with mod assist for steadying, physically supporting both pt and RW. Pt very unsteady vs baseline and has difficulty following commands presently. At baseline, pt is independent and mobilizes with cane. Pt lives alone and will not have 24/7 support, recommending ST-SNF for rehabilitation to return to PLOF and maximize pt independence. PT to progress mobility as tolerated, and will continue to follow acutely.      Follow Up Recommendations SNF;Supervision/Assistance - 24 hour    Equipment Recommendations  None recommended by PT    Recommendations for Other Services       Precautions / Restrictions Precautions Precautions: Fall Restrictions Weight Bearing Restrictions: No      Mobility  Bed Mobility Overal bed mobility: Needs Assistance Bed Mobility: Sit to Supine           General bed mobility comments: sitting EOB with RN and NT upon PT arrival to room, requires mod assist for return to supine for LE lifting, trunk lowering, and boost up in bed with use of boost function and bed pads. Pt assisting with scoot up in bed by performing bridge motion with LEs.  Transfers Overall transfer level: Needs assistance Equipment used: Rolling walker (2 wheeled) Transfers: Sit to/from Stand Sit  to Stand: Min assist;Mod assist;From elevated surface         General transfer comment: Min assist for power up from elevated bed, mod assist for power up from low toilet with PT steadying RW as pt pulling on it to stand.  Ambulation/Gait Ambulation/Gait assistance: Mod assist Gait Distance (Feet): 20 Feet (back and forth to bathroom) Assistive device: Rolling walker (2 wheeled) Gait Pattern/deviations: Step-through pattern;Decreased stride length;Trunk flexed Gait velocity: decr   General Gait Details: Mod assist to steady, physically direct and hold RW steady, pt positioning within RW (not followed well even with visual cues). Slow, very unsteady, and lifting RW off ground and sideways without PT holding it down.  Stairs            Wheelchair Mobility    Modified Rankin (Stroke Patients Only)       Balance Overall balance assessment: Needs assistance Sitting-balance support: No upper extremity supported;Feet supported Sitting balance-Leahy Scale: Fair     Standing balance support: Bilateral upper extremity supported Standing balance-Leahy Scale: Poor Standing balance comment: reliant on external assist                             Pertinent Vitals/Pain Pain Assessment: Faces Faces Pain Scale: Hurts little more Pain Location: back Pain Descriptors / Indicators: Sore;Discomfort;Grimacing Pain Intervention(s): Limited activity within patient's tolerance;Monitored during session;Repositioned    Home Living Family/patient expects to be discharged to:: Private residence Living Arrangements: Alone Available Help at Discharge: Family;Available PRN/intermittently Type of Home: Apartment Home Access: Elevator     Home Layout: One level Home Equipment: Kasandra Knudsen -  single point      Prior Function Level of Independence: Independent with assistive device(s)         Comments: Pt's son Jiles Prows in room states pt ambulating with cane PTA, either took a bus or  son took pt to HD appointments. Otherwise, pt independent in home PTA.     Hand Dominance   Dominant Hand: Right    Extremity/Trunk Assessment   Upper Extremity Assessment Upper Extremity Assessment: Generalized weakness    Lower Extremity Assessment Lower Extremity Assessment: Generalized weakness    Cervical / Trunk Assessment Cervical / Trunk Assessment: Normal  Communication   Communication: No difficulties  Cognition Arousal/Alertness: Awake/alert (drowsy) Behavior During Therapy: WFL for tasks assessed/performed Overall Cognitive Status: Impaired/Different from baseline Area of Impairment: Orientation;Memory;Following commands;Safety/judgement;Problem solving;Awareness                 Orientation Level: Disoriented to;Time;Situation   Memory: Decreased short-term memory Following Commands: Follows one step commands with increased time Safety/Judgement: Decreased awareness of safety;Decreased awareness of deficits Awareness: Intellectual Problem Solving: Difficulty sequencing;Requires verbal cues;Requires tactile cues;Slow processing;Decreased initiation General Comments: Pt states the year is "19...." and trails off, does not know what month it is or why he is in the hospital. Pt lacking insight into deficits, unable to apply PT cues at times due to pt thinking he was mobilizing well. Pt with difficulty sequencing tasks, i.e. got to the toilet and pt did not pull underwear down before sitting.      General Comments General comments (skin integrity, edema, etc.): DOE 2/4 post-ambulation, HR 83 bpm post-ambulation    Exercises     Assessment/Plan    PT Assessment Patient needs continued PT services  PT Problem List Decreased strength;Decreased mobility;Decreased safety awareness;Decreased activity tolerance;Decreased cognition;Decreased balance;Decreased knowledge of use of DME;Pain       PT Treatment Interventions DME instruction;Therapeutic  activities;Gait training;Therapeutic exercise;Patient/family education;Balance training;Functional mobility training;Neuromuscular re-education    PT Goals (Current goals can be found in the Care Plan section)  Acute Rehab PT Goals Patient Stated Goal: return to independence PT Goal Formulation: With patient Time For Goal Achievement: 10/15/19 Potential to Achieve Goals: Good    Frequency Min 2X/week   Barriers to discharge        Co-evaluation               AM-PAC PT "6 Clicks" Mobility  Outcome Measure Help needed turning from your back to your side while in a flat bed without using bedrails?: A Lot Help needed moving from lying on your back to sitting on the side of a flat bed without using bedrails?: A Lot Help needed moving to and from a bed to a chair (including a wheelchair)?: A Lot Help needed standing up from a chair using your arms (e.g., wheelchair or bedside chair)?: A Lot Help needed to walk in hospital room?: A Lot Help needed climbing 3-5 steps with a railing? : Total 6 Click Score: 11    End of Session   Activity Tolerance: Patient limited by fatigue Patient left: in bed;with call bell/phone within reach;with bed alarm set;with family/visitor present Nurse Communication: Mobility status PT Visit Diagnosis: Other abnormalities of gait and mobility (R26.89);Unsteadiness on feet (R26.81)    Time: 6503-5465 PT Time Calculation (min) (ACUTE ONLY): 25 min   Charges:   PT Evaluation $PT Eval Low Complexity: 1 Low PT Treatments $Gait Training: 8-22 mins       Simeon Vera E, PT [place] Pager [phone]  Office [phone]    Julien Girt 10/01/2019, 4:40 PM

## 2019-10-01 NOTE — Progress Notes (Signed)
Progress Note    Duane Ortiz  UYQ:[id number] DOB: [birth year]  DOA: 09/30/2019 PCP: Sandi Mariscal, MD    Brief Narrative:     Medical records reviewed and are as summarized below:  Duane Ortiz is an 69 y.o. 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  Pt recently admitted to hospital 9/17-9/21 to start dialysis.  During that admit: Xarelto was changed to eliquis, BP medications were changed (increased) due to uncontrolled HTN.  Pt presents to ED today with confusion.  Pt LKW 2 days ago.  Last dialysis was on Sat.  No falls that family is aware of.  Per pt son report to EDP: He apparently spent the afternoon with a friend who thought he was acting abnormally over the weekend and then this AM patient's son found him to be very confused. He states the patient was downstairs in his underwear, was confused about his location, and is having trouble communicating. No alleviating/aggravating factors.  Assessment/Plan:   Principal Problem:   Acute encephalopathy Active Problems:   Essential hypertension   Coagulation defect (HCC)   ESRD (end stage renal disease) ([place])   AMS (altered mental status)   Acute encephalopathy - -Prior to admission he walked with a cane and was driving - suspect Baclofen toxicity as cause -Ammonia is only 28 -MRI neg for stroke -No SIRS to suggest infection at this point. 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  Family Communication: son at bedside Disposition Plan: Status is: Inpatient  Remains inpatient appropriate  because:Inpatient level of care appropriate due to severity of illness   Dispo: The patient is from: Home              Anticipated d/c is to: Home              Anticipated d/c date is: 2 days              Patient currently is not medically stable to d/c.         Medical Consultants:    renal     Subjective:   Falling asleep while taking-- son at bedside says he not quite himself yet but MUCH improved from prior   Objective:    Vitals:   10/01/19 0937 10/01/19 1000 10/01/19 1006 10/01/19 1109  BP: (!) 91/51 (!) 82/56 (!) 121/54 (!) 129/53  Pulse: 81 69 69 71  Resp: (!) 30 18 (!) 23 (!) 22  Temp:   (!) 97.5 F (36.4 C) 98.2 F (36.8 C)  TempSrc:   Axillary Oral  SpO2: 96% 96% 98% 97%  Weight:    82.4 kg    Intake/Output Summary (Last 24 hours) at 10/01/2019 1530 Last data filed at 10/01/2019 1006 Gross per 24 hour  Intake --  Output 1068 ml  Net -1068 ml   Filed Weights   10/01/19 1109  Weight: 82.4 kg    Exam:  General: Appearance:     Overweight male in no acute distress     Lungs:  respirations unlabored  Heart:    Normal heart rate. Normal rhythm. No murmurs, rubs, or gallops.   MS:   Moves all ext  Neurologic:   Awake- will answer questions but falls asleep      Data Reviewed:   I have personally reviewed following labs and imaging studies:  Labs: Labs show the following:   Basic Metabolic Panel: Recent Labs  Lab 09/30/19 1204 09/30/19 1204 09/30/19 1417 10/01/19 0325  NA 140  --  136 138  K 4.0   < > 3.8 3.2*  CL 98  --  101 98  CO2 22  --   --  22  GLUCOSE 98  --  87 88  BUN 44*  --  47* 50*  CREATININE 9.25*  --  9.90* 9.78*  CALCIUM 8.9  --   --  8.5*   < > = values in this interval not displayed.   GFR Estimated Creatinine Clearance: 7.1 mL/min (A) (by C-G formula based on SCr of 9.78 mg/dL (H)). Liver Function Tests: Recent Labs  Lab 09/30/19 1204  AST 22  ALT 20  ALKPHOS 59  BILITOT 0.7  PROT 7.6  ALBUMIN  3.9   No results for input(s): LIPASE, AMYLASE in the last 168 hours. Recent Labs  Lab 09/30/19 1504  AMMONIA 28   Coagulation profile Recent Labs  Lab 09/30/19 1204  INR 1.2    CBC: Recent Labs  Lab 09/30/19 1204 09/30/19 1417 10/01/19 0325  WBC 9.4  --  6.9  NEUTROABS 5.9  --   --   HGB 9.9* 11.6* 9.4*  HCT 34.6* 34.0* 31.0*  MCV 96.1  --  94.5  PLT 210  --  208   Cardiac Enzymes: No results for input(s): CKTOTAL, CKMB, CKMBINDEX, TROPONINI in the last 168 hours. BNP (last 3 results) No results for input(s): PROBNP in the last 8760 hours. CBG: Recent Labs  Lab 09/30/19 1157 09/30/19 1507 10/01/19 0850  GLUCAP 96 89 123*   D-Dimer: No results for input(s): DDIMER in the last 72 hours. Hgb A1c: No results for input(s): HGBA1C in the last 72 hours. Lipid Profile: No results for input(s): CHOL, HDL, LDLCALC, TRIG, CHOLHDL, LDLDIRECT in the last 72 hours. Thyroid function studies: No results for input(s): TSH, T4TOTAL, T3FREE, THYROIDAB in the last 72 hours.  Invalid input(s): FREET3 Anemia work up: No results for input(s): VITAMINB12, FOLATE, FERRITIN, TIBC, IRON, RETICCTPCT in the last 72 hours. Sepsis Labs: Recent Labs  Lab 09/30/19 1204 09/30/19 2050 09/30/19 2230 10/01/19 0325  WBC 9.4  --   --  6.9  LATICACIDVEN  --  1.1 1.0  --     Microbiology Recent Results (from the past 240 hour(s))  Respiratory Panel by RT PCR (Flu A&B, Covid) - Nasopharyngeal Swab     Status: None   Collection Time: 09/30/19  8:45 PM   Specimen: Nasopharyngeal Swab  Result Value Ref Range Status   SARS Coronavirus 2 by RT PCR NEGATIVE NEGATIVE Final    Comment: (NOTE) SARS-CoV-2 target nucleic acids are NOT DETECTED.  The SARS-CoV-2 RNA is generally detectable in upper respiratoy specimens during the acute phase of infection. The lowest concentration of SARS-CoV-2 viral copies this assay can detect is 131 copies/mL. A negative result does not preclude  SARS-Cov-2 infection and should not be used as the sole basis for treatment or other patient management decisions. A negative result may occur with  improper specimen collection/handling, submission of specimen other than nasopharyngeal swab, presence of  viral mutation(s) within the areas targeted by this assay, and inadequate number of viral copies (<131 copies/mL). A negative result must be combined with clinical observations, patient history, and epidemiological information. The expected result is Negative.  Fact Sheet for Patients:  PinkCheek.be  Fact Sheet for Healthcare Providers:  GravelBags.it  This test is no t yet approved or cleared by the [place] FDA and  has been authorized for detection and/or diagnosis of SARS-CoV-2 by FDA under an Emergency Use Authorization (EUA). This EUA will remain  in effect (meaning this test can be used) for the duration of the COVID-19 declaration under Section 564(b)(1) of the Act, 21 U.S.C. section 360bbb-3(b)(1), unless the authorization is terminated or revoked sooner.     Influenza A by PCR NEGATIVE NEGATIVE Final   Influenza B by PCR NEGATIVE NEGATIVE Final    Comment: (NOTE) The Xpert Xpress SARS-CoV-2/FLU/RSV assay is intended as an aid in  the diagnosis of influenza from Nasopharyngeal swab specimens and  should not be used as a sole basis for treatment. Nasal washings and  aspirates are unacceptable for Xpert Xpress SARS-CoV-2/FLU/RSV  testing.  Fact Sheet for Patients: PinkCheek.be  Fact Sheet for Healthcare Providers: GravelBags.it  This test is not yet approved or cleared by the [place] FDA and  has been authorized for detection and/or diagnosis of SARS-CoV-2 by  FDA under an Emergency Use Authorization (EUA). This EUA will remain  in effect (meaning this test can be used) for the duration of the   Covid-19 declaration under Section 564(b)(1) of the Act, 21  U.S.C. section 360bbb-3(b)(1), unless the authorization is  terminated or revoked. Performed at [facility], [place] [address], [place], [place] [address]     Procedures and diagnostic studies:  CT HEAD WO CONTRAST  Result Date: 09/30/2019 CLINICAL DATA:  Neuro deficit, acute, stroke suspected. Additional history provided: Patient presents with stroke like symptoms, repeating his name and unable to follow commands. EXAM: CT HEAD WITHOUT CONTRAST TECHNIQUE: Contiguous axial images were obtained from the base of the skull through the vertex without intravenous contrast. COMPARISON:  No pertinent prior exams are available for comparison. FINDINGS: Brain: Mild-to-moderate generalized cerebral atrophy. Advanced ill-defined hypoattenuation within the cerebral white matter which is nonspecific, but consistent with chronic small vessel ischemic disease. There is no acute intracranial hemorrhage. No demarcated cortical infarct. No extra-axial fluid collection. No evidence of intracranial mass. No midline shift. Vascular: No hyperdense vessel. Skull: Normal. Negative for fracture or focal lesion. Sinuses/Orbits: Visualized orbits show no acute finding. No significant paranasal sinus disease or mastoid effusion at the imaged levels. Other: Presumed chronic fracture deformity of the left nasal bone without appreciable overlying soft tissue swelling (series 4, image 10). IMPRESSION: No CT evidence of acute intracranial abnormality. Advanced cerebral white matter chronic small vessel ischemic disease. Mild-to-moderate generalized cerebral atrophy. A fracture deformity of the left nasal bone is presumed chronic. However, clinical correlation is recommended. Electronically Signed   By: Kellie Simmering DO   On: 09/30/2019 12:57   MR ANGIO HEAD WO CONTRAST  Result Date: 09/30/2019 CLINICAL DATA:  Acute neurologic deficit.  Stroke suspected. EXAM: MRI HEAD  WITHOUT CONTRAST MRA HEAD WITHOUT CONTRAST MRA NECK WITHOUT CONTRAST TECHNIQUE: Multiplanar, multiecho pulse sequences of the brain and surrounding structures were obtained without intravenous contrast. Angiographic images of the Circle of Willis were obtained using MRA technique without intravenous contrast. Angiographic images of the neck were obtained using MRA technique without intravenous contrast. Carotid stenosis measurements (when  applicable) are obtained utilizing NASCET criteria, using the distal internal carotid diameter as the denominator. COMPARISON:  None. FINDINGS: MRI HEAD FINDINGS Brain: No acute infarct, acute hemorrhage or extra-axial collection. Diffuse confluent hyperintense T2-weighted signal within the periventricular, deep and juxtacortical white matter. There is generalized atrophy without lobar predilection. Fewer than 5 scattered microhemorrhages in a nonspecific pattern. Normal midline structures. Vascular: Normal flow voids. Skull and upper cervical spine: Normal marrow signal. Sinuses/Orbits: Negative. Other: None. MRA HEAD FINDINGS POSTERIOR CIRCULATION: --Vertebral arteries: Normal V4 segments. --Inferior cerebellar arteries: Normal. --Basilar artery: Normal. --Superior cerebellar arteries: Normal. --Posterior cerebral arteries: Normal. There are bilateral posterior communicating arteries (p-comm) that partially supply the PCAs. ANTERIOR CIRCULATION: --Intracranial internal carotid arteries: Normal. --Anterior cerebral arteries (ACA): Normal. Both A1 segments are present. Patent anterior communicating artery (a-comm). --Middle cerebral arteries (MCA): Normal. MRA NECK FINDINGS MRA of the neck is greatly degraded by motion. There is no stenosis at either carotid bifurcation. The V2 segments of the vertebral arteries are normal. Otherwise, visualization is poor. IMPRESSION: 1. No acute intracranial abnormality.  Normal intracranial MRA. 2. Advanced chronic small vessel disease and  generalized atrophy. 3. Motion degraded MRA of the neck without emergent large vessel occlusion or high-grade stenosis. Electronically Signed   By: Ulyses Jarred M.D.   On: 09/30/2019 19:05   MR ANGIO NECK WO CONTRAST  Result Date: 09/30/2019 CLINICAL DATA:  Acute neurologic deficit.  Stroke suspected. EXAM: MRI HEAD WITHOUT CONTRAST MRA HEAD WITHOUT CONTRAST MRA NECK WITHOUT CONTRAST TECHNIQUE: Multiplanar, multiecho pulse sequences of the brain and surrounding structures were obtained without intravenous contrast. Angiographic images of the Circle of Willis were obtained using MRA technique without intravenous contrast. Angiographic images of the neck were obtained using MRA technique without intravenous contrast. Carotid stenosis measurements (when applicable) are obtained utilizing NASCET criteria, using the distal internal carotid diameter as the denominator. COMPARISON:  None. FINDINGS: MRI HEAD FINDINGS Brain: No acute infarct, acute hemorrhage or extra-axial collection. Diffuse confluent hyperintense T2-weighted signal within the periventricular, deep and juxtacortical white matter. There is generalized atrophy without lobar predilection. Fewer than 5 scattered microhemorrhages in a nonspecific pattern. Normal midline structures. Vascular: Normal flow voids. Skull and upper cervical spine: Normal marrow signal. Sinuses/Orbits: Negative. Other: None. MRA HEAD FINDINGS POSTERIOR CIRCULATION: --Vertebral arteries: Normal V4 segments. --Inferior cerebellar arteries: Normal. --Basilar artery: Normal. --Superior cerebellar arteries: Normal. --Posterior cerebral arteries: Normal. There are bilateral posterior communicating arteries (p-comm) that partially supply the PCAs. ANTERIOR CIRCULATION: --Intracranial internal carotid arteries: Normal. --Anterior cerebral arteries (ACA): Normal. Both A1 segments are present. Patent anterior communicating artery (a-comm). --Middle cerebral arteries (MCA): Normal. MRA NECK  FINDINGS MRA of the neck is greatly degraded by motion. There is no stenosis at either carotid bifurcation. The V2 segments of the vertebral arteries are normal. Otherwise, visualization is poor. IMPRESSION: 1. No acute intracranial abnormality.  Normal intracranial MRA. 2. 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 3. Motion degraded MRA of the neck without emergent large vessel occlusion or high-grade stenosis. Electronically Signed   By: Ulyses Jarred M.D.   On: 09/30/2019 19:05   MR Brain Wo Contrast (neuro protocol)  Result Date: 09/30/2019 CLINICAL DATA:  Acute neurologic deficit.  Stroke suspected. EXAM: MRI HEAD WITHOUT CONTRAST MRA HEAD WITHOUT CONTRAST MRA NECK WITHOUT CONTRAST TECHNIQUE: Multiplanar, multiecho pulse sequences of the brain and surrounding structures were obtained without intravenous contrast. Angiographic images of the Circle of Willis were obtained using MRA technique without intravenous contrast. Angiographic images of the  neck were obtained using MRA technique without intravenous contrast. Carotid stenosis measurements (when applicable) are obtained utilizing NASCET criteria, using the distal internal carotid diameter as the denominator. COMPARISON:  None. FINDINGS: MRI HEAD FINDINGS Brain: No acute infarct, acute hemorrhage or extra-axial collection. Diffuse confluent hyperintense T2-weighted signal within the periventricular, deep and juxtacortical white matter. There is generalized atrophy without lobar predilection. Fewer than 5 scattered microhemorrhages in a nonspecific pattern. Normal midline structures. Vascular: Normal flow voids. Skull and upper cervical spine: Normal marrow signal. Sinuses/Orbits: Negative. Other: None. MRA HEAD FINDINGS POSTERIOR CIRCULATION: --Vertebral arteries: Normal V4 segments. --Inferior cerebellar arteries: Normal. --Basilar artery: Normal. --Superior cerebellar arteries: Normal. --Posterior cerebral arteries: Normal. There  are bilateral posterior communicating arteries (p-comm) that partially supply the PCAs. ANTERIOR CIRCULATION: --Intracranial internal carotid arteries: Normal. --Anterior cerebral arteries (ACA): Normal. Both A1 segments are present. Patent anterior communicating artery (a-comm). --Middle cerebral arteries (MCA): Normal. MRA NECK FINDINGS MRA of the neck is greatly degraded by motion. There is no stenosis at either carotid bifurcation. The V2 segments of the vertebral arteries are normal. Otherwise, visualization is poor. IMPRESSION: 1. No acute intracranial abnormality.  Normal intracranial MRA. 2. 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 3. Motion degraded MRA of the neck without emergent large vessel occlusion or high-grade stenosis. Electronically Signed   By: Ulyses Jarred M.D.   On: 09/30/2019 19:05   DG Chest Port 1 View  Result Date: 09/30/2019 CLINICAL DATA:  Altered mental status EXAM: PORTABLE CHEST 1 VIEW COMPARISON:  05/28/2018 FINDINGS: Hypoventilation. Decreased lung volume with mild atelectasis in the bases. Negative for heart failure or pneumonia. No effusion. Vascular stent in the left upper arm. IMPRESSION: Hypoventilation.  No acute abnormality. Electronically Signed   By: Franchot Gallo M.D.   On: 09/30/2019 13:47    Medications:   . allopurinol  100 mg Oral Daily  . amLODipine  5 mg Oral Daily  . apixaban  5 mg Oral BID  . calcitRIOL  0.25 mcg Oral Daily  . Chlorhexidine Gluconate Cloth  6 each Topical Q0600  . cloNIDine  0.1 mg Oral TID  . ferric citrate  210 mg Oral TID WC  . hydrALAZINE  25 mg Oral TID  . lidocaine  1 patch Transdermal Q24H  . nebivolol  10 mg Oral QHS  . sodium chloride flush  3 mL Intravenous Once  . tamsulosin  0.4 mg Oral QHS   Continuous Infusions:   LOS: 0 days   Geradine Girt  [facility]   How to contact the [facility] Attending or Consulting provider [place] or covering provider during after hours [place], for this  patient?  1. Check the care team in [facility] and look for a) attending/consulting [facility] provider listed and b) the [facility] team listed 2. Log into [email or web address] and use [place] universal password to access. If you do not have the password, please contact the hospital operator. 3. Locate the [facility] provider you are looking for under [facility] and page to a number that you can be directly reached. 4. If you still have difficulty reaching the provider, please page the [facility] (Director on Call) for the Hospitalists listed on amion for assistance.  10/01/2019, 3:30 PM

## 2019-10-02 DIAGNOSIS — G92 Toxic encephalopathy: Secondary | ICD-10-CM | POA: Diagnosis not present

## 2019-10-02 DIAGNOSIS — R4182 Altered mental status, unspecified: Secondary | ICD-10-CM | POA: Diagnosis not present

## 2019-10-02 DIAGNOSIS — I10 Essential (primary) hypertension: Secondary | ICD-10-CM | POA: Diagnosis not present

## 2019-10-02 DIAGNOSIS — G9340 Encephalopathy, unspecified: Secondary | ICD-10-CM | POA: Diagnosis not present

## 2019-10-02 DIAGNOSIS — R531 Weakness: Secondary | ICD-10-CM | POA: Diagnosis not present

## 2019-10-02 LAB — RENAL FUNCTION PANEL
Albumin: 3.1 g/dL — ABNORMAL LOW (ref 3.5–5.0)
Anion gap: 17 — ABNORMAL HIGH (ref 5–15)
BUN: 37 mg/dL — ABNORMAL HIGH (ref 8–23)
CO2: 23 mmol/L (ref 22–32)
Calcium: 8.4 mg/dL — ABNORMAL LOW (ref 8.9–10.3)
Chloride: 95 mmol/L — ABNORMAL LOW (ref 98–111)
Creatinine, Ser: 8.46 mg/dL — ABNORMAL HIGH (ref 0.61–1.24)
GFR calc Af Amer: 7 mL/min — ABNORMAL LOW (ref >60)
GFR calc non Af Amer: 6 mL/min — ABNORMAL LOW (ref >60)
Glucose, Bld: 212 mg/dL — ABNORMAL HIGH (ref 70–99)
Phosphorus: 5.3 mg/dL — ABNORMAL HIGH (ref 2.5–4.6)
Potassium: 3.4 mmol/L — ABNORMAL LOW (ref 3.5–5.1)
Sodium: 135 mmol/L (ref 135–145)

## 2019-10-02 MED ORDER — CHLORHEXIDINE GLUCONATE CLOTH 2 % EX PADS
6.0000 | MEDICATED_PAD | Freq: Every day | CUTANEOUS | Status: DC
  Administered 2019-10-03: 6 via TOPICAL

## 2019-10-02 NOTE — Progress Notes (Addendum)
[facility] Progress Note  Name: Duane Ortiz MRN: [id number] DOB: July 03, 1950  Chief Complaint:  ams  Subjective:  Last HD on 9/28 with 1.1 kg UF.  He states it's painful getting stuck for HD and he doesn't like the changes he's seen in his skin.  He has trouble coming up with the year so hx is somewhat limited. He's talking with family.   Review of systems:    Denies shortness of breath or chest pain  Denies n/v  No intake or output data in the 24 hours ending 10/02/19 1053  Vitals:  Vitals:   10/01/19 2201 10/01/19 2347 10/02/19 0132 10/02/19 0739  BP: (!) 122/59   120/66  Pulse:    (!) 114  Resp:    17  Temp:    97.9 F (36.6 C)  TempSrc:      SpO2:    100%  Weight:  82.6 kg 82.6 kg      Physical Exam:  General adult male in bed in no acute distress  HEENT normocephalic atraumatic extraocular movements intact sclera anicteric Neck supple trachea midline Lungs clear to auscultation bilaterally normal work of breathing at rest  Heart S1S2 no rub Abdomen soft nontender nondistended Extremities no edema  Psych no anxiety or agitation  Neuro - doesn't know year "1920 or is it 21" Oriented to person and location of [facility].  Access LUE AVF bruit and thrill   Medications reviewed   Labs:  BMP Latest Ref Rng & Units 10/01/2019 09/30/2019 09/30/2019  Glucose 70 - 99 mg/dL 88 87 98  BUN 8 - 23 mg/dL 50(H) 47(H) 44(H)  Creatinine 0.61 - 1.24 mg/dL 9.78(H) 9.90(H) 9.25(H)  Sodium 135 - 145 mmol/L 138 136 140  Potassium 3.5 - 5.1 mmol/L 3.2(L) 3.8 4.0  Chloride 98 - 111 mmol/L 98 101 98  CO2 22 - 32 mmol/L 22 - 22  Calcium 8.9 - 10.3 mg/dL 8.5(L) - 8.9    NW TTS 180 BFR 400 DFR 800 2K/2.5Ca  EDW 85.5kg, post wt Thurs 87, post wt Sat 89.5kg  Assessment/Plan:   **AMS: eval to date largely unrevealing.  This is not consistent with dialysis disequilibrium or uremia. May be baclofen toxicity with 10mg  dose on his med list - improving; work-up per primary team    **ESRD on HD:  - HD per TTS schedule   **Anemia of CKD - no urgent need for PRBC's.   **HTN:  follow on current regimen and optimize volume with HD  **secondary hyperparathyroidism:  calctriol 0.25 daily - will then transition to calcitriol at outpatient HD unit three times a week on discharge.  On auryxia TID AC for hyperphos.   **h/o PE: eliquis; anticoagulation per primary team     Disposition per primary team.  Adjusting to HD has been hard.  I have recommended that he discuss this with his family.   Claudia Desanctis, MD 10/02/2019 11:07 AM

## 2019-10-02 NOTE — Progress Notes (Addendum)
Progress Note    Duane Ortiz  GLO:[id number] DOB: 03-29-1950  DOA: 09/30/2019 PCP: Sandi Mariscal, MD    Brief Narrative:     Medical records reviewed and are as summarized below:  Duane Ortiz is an 69 y.o. 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  Pt recently admitted to hospital 9/17-9/21 to start dialysis.  During that admit: Xarelto was changed to eliquis, BP medications were changed (increased) due to uncontrolled HTN.  Pt presented to ED with confusion.  Pt LKW 2 days ago.  Last dialysis was on Sat.  No falls that family is aware of.  Per pt son report to EDP: He apparently spent the afternoon with a friend who thought he was acting abnormally over the weekend and then this AM patient's son found him to be very confused. He states the patient was downstairs in his underwear, was confused about his location, and is having trouble communicating. No alleviating/aggravating factors.  Assessment/Plan:   Acute metabolic encephalopathy Reason for his encephalopathy is not entirely clear.  Patient did not have any focal deficits.  MRI was negative for stroke.  No evidence for infection.  Apparently prior to admission he was walking with a cane and was also driving.  Baclofen toxicity is considered a likely reason.  This has been stopped.  He has been on hemodialysis only for about a week or so.  No dialysis related etiology for his presentation.  Mentation appears to be improving.  He did work with physical therapy yesterday.  SNF was recommended at that time.  Will request them to reevaluate.  We will also have TOC see the patient to plan for placement to SNF for short-term rehab.  Essential hypertension Blood pressure reasonably well controlled.  Patient currently noted to be on amlodipine, clonidine, hydralazine and Bystolic.  ESRD on hemodialysis Tuesday Thursday Saturday.   Dialyzed  yesterday.  Nephrology is following.  History of pulmonary embolism Noted to be on apixaban which is being continued.  Back pain Lidocaine patch.  Baclofen discontinued.  Anemia of chronic disease Hemoglobin stable.  No evidence of overt bleeding.    Family Communication/Anticipated D/C date and plan/Code Status   DVT prophylaxis: Chronically anticoagulated.  Currently on apixaban Code Status: Full Code.  Family Communication: son at bedside Disposition Plan: SNF recommended by PT.  Status is: Inpatient  Remains inpatient appropriate because:Inpatient level of care appropriate due to severity of illness   Dispo: The patient is from: Home              Anticipated d/c is to: SNF              Anticipated d/c date is: 2 days              Patient currently is not medically stable to d/c.      Medical Consultants:    Nephrology     Subjective:   Patient more awake alert.  No family at bedside.  States that he is feeling better.  Denies any complaints except for leg cramps that he experienced while undergoing dialysis yesterday.  Objective:    Vitals:   10/01/19 2201 10/01/19 2347 10/02/19 0132 10/02/19 0739  BP: (!) 122/59   120/66  Pulse:    (!) 114  Resp:    17  Temp:    97.9 F (36.6 C)  TempSrc:      SpO2:  100%  Weight:  82.6 kg 82.6 kg    No intake or output data in the 24 hours ending 10/02/19 1123 Filed Weights   10/01/19 1109 10/01/19 2347 10/02/19 0132  Weight: 82.4 kg 82.6 kg 82.6 kg    Exam:  General appearance: Awake alert.  In no distress.  Mildly distracted Resp: Clear to auscultation bilaterally.  Normal effort Cardio: S1-S2 is normal regular.  No S3-S4.  No rubs murmurs or bruit GI: Abdomen is soft.  Nontender nondistended.  Bowel sounds are present normal.  No masses organomegaly Extremities: No edema.  Full range of motion of lower extremities. Neurologic: He is alert.  Oriented to place, month.  He got the year incorrect.  He  knew the name of the president.  No focal deficits noted.   Data Reviewed:    Labs: Labs show the following:   Basic Metabolic Panel: Recent Labs  Lab 09/30/19 1204 09/30/19 1204 09/30/19 1417 10/01/19 0325  NA 140  --  136 138  K 4.0   < > 3.8 3.2*  CL 98  --  101 98  CO2 22  --   --  22  GLUCOSE 98  --  87 88  BUN 44*  --  47* 50*  CREATININE 9.25*  --  9.90* 9.78*  CALCIUM 8.9  --   --  8.5*   < > = values in this interval not displayed.   GFR Estimated Creatinine Clearance: 7.1 mL/min (A) (by C-G formula based on SCr of 9.78 mg/dL (H)). Liver Function Tests: Recent Labs  Lab 09/30/19 1204  AST 22  ALT 20  ALKPHOS 59  BILITOT 0.7  PROT 7.6  ALBUMIN 3.9    Recent Labs  Lab 09/30/19 1504  AMMONIA 28   Coagulation profile Recent Labs  Lab 09/30/19 1204  INR 1.2    CBC: Recent Labs  Lab 09/30/19 1204 09/30/19 1417 10/01/19 0325  WBC 9.4  --  6.9  NEUTROABS 5.9  --   --   HGB 9.9* 11.6* 9.4*  HCT 34.6* 34.0* 31.0*  MCV 96.1  --  94.5  PLT 210  --  208   CBG: Recent Labs  Lab 09/30/19 1157 09/30/19 1507 10/01/19 0850  GLUCAP 96 89 123*   Sepsis Labs: Recent Labs  Lab 09/30/19 1204 09/30/19 2050 09/30/19 2230 10/01/19 0325  WBC 9.4  --   --  6.9  LATICACIDVEN  --  1.1 1.0  --     Microbiology Recent Results (from the past 240 hour(s))  Respiratory Panel by RT PCR (Flu A&B, Covid) - Nasopharyngeal Swab     Status: None   Collection Time: 09/30/19  8:45 PM   Specimen: Nasopharyngeal Swab  Result Value Ref Range Status   SARS Coronavirus 2 by RT PCR NEGATIVE NEGATIVE Final    Comment: (NOTE) SARS-CoV-2 target nucleic acids are NOT DETECTED.  The SARS-CoV-2 RNA is generally detectable in upper respiratoy specimens during the acute phase of infection. The lowest concentration of SARS-CoV-2 viral copies this assay can detect is 131 copies/mL. A negative result does not preclude SARS-Cov-2 infection and should not be used as the  sole basis for treatment or other patient management decisions. A negative result may occur with  improper specimen collection/handling, submission of specimen other than nasopharyngeal swab, presence of viral mutation(s) within the areas targeted by this assay, and inadequate number of viral copies (<131 copies/mL). A negative result must be combined with clinical observations, patient history, and epidemiological  information. The expected result is Negative.  Fact Sheet for Patients:  PinkCheek.be  Fact Sheet for Healthcare Providers:  GravelBags.it  This test is no t yet approved or cleared by the [place] FDA and  has been authorized for detection and/or diagnosis of SARS-CoV-2 by FDA under an Emergency Use Authorization (EUA). This EUA will remain  in effect (meaning this test can be used) for the duration of the COVID-19 declaration under Section 564(b)(1) of the Act, 21 U.S.C. section 360bbb-3(b)(1), unless the authorization is terminated or revoked sooner.     Influenza A by PCR NEGATIVE NEGATIVE Final   Influenza B by PCR NEGATIVE NEGATIVE Final    Comment: (NOTE) The Xpert Xpress SARS-CoV-2/FLU/RSV assay is intended as an aid in  the diagnosis of influenza from Nasopharyngeal swab specimens and  should not be used as a sole basis for treatment. Nasal washings and  aspirates are unacceptable for Xpert Xpress SARS-CoV-2/FLU/RSV  testing.  Fact Sheet for Patients: PinkCheek.be  Fact Sheet for Healthcare Providers: GravelBags.it  This test is not yet approved or cleared by the [place] FDA and  has been authorized for detection and/or diagnosis of SARS-CoV-2 by  FDA under an Emergency Use Authorization (EUA). This EUA will remain  in effect (meaning this test can be used) for the duration of the  Covid-19 declaration under Section 564(b)(1) of  the Act, 21  U.S.C. section 360bbb-3(b)(1), unless the authorization is  terminated or revoked. Performed at [facility], [place] [address] [place], [place] [address]     Procedures and diagnostic studies:  CT HEAD WO CONTRAST  Result Date: 09/30/2019 CLINICAL DATA:  Neuro deficit, acute, stroke suspected. Additional history provided: Patient presents with stroke like symptoms, repeating his name and unable to follow commands. EXAM: CT HEAD WITHOUT CONTRAST TECHNIQUE: Contiguous axial images were obtained from the base of the skull through the vertex without intravenous contrast. COMPARISON:  No pertinent prior exams are available for comparison. FINDINGS: Brain: Mild-to-moderate generalized cerebral atrophy. Advanced ill-defined hypoattenuation within the cerebral white matter which is nonspecific, but consistent with chronic small vessel ischemic disease. There is no acute intracranial hemorrhage. No demarcated cortical infarct. No extra-axial fluid collection. No evidence of intracranial mass. No midline shift. Vascular: No hyperdense vessel. Skull: Normal. Negative for fracture or focal lesion. Sinuses/Orbits: Visualized orbits show no acute finding. No significant paranasal sinus disease or mastoid effusion at the imaged levels. Other: Presumed chronic fracture deformity of the left nasal bone without appreciable overlying soft tissue swelling (series 4, image 10). IMPRESSION: No CT evidence of acute intracranial abnormality. Advanced cerebral white matter chronic small vessel ischemic disease. Mild-to-moderate generalized cerebral atrophy. A fracture deformity of the left nasal bone is presumed chronic. However, clinical correlation is recommended. Electronically Signed   By: Kellie Simmering DO   On: 09/30/2019 12:57   MR ANGIO HEAD WO CONTRAST  Result Date: 09/30/2019 CLINICAL DATA:  Acute neurologic deficit.  Stroke suspected. EXAM: MRI HEAD WITHOUT CONTRAST MRA HEAD WITHOUT CONTRAST MRA  NECK WITHOUT CONTRAST TECHNIQUE: Multiplanar, multiecho pulse sequences of the brain and surrounding structures were obtained without intravenous contrast. Angiographic images of the Circle of Willis were obtained using MRA technique without intravenous contrast. Angiographic images of the neck were obtained using MRA technique without intravenous contrast. Carotid stenosis measurements (when applicable) are obtained utilizing NASCET criteria, using the distal internal carotid diameter as the denominator. COMPARISON:  None. FINDINGS: MRI HEAD FINDINGS Brain: No acute infarct, acute hemorrhage or extra-axial  collection. Diffuse confluent hyperintense T2-weighted signal within the periventricular, deep and juxtacortical white matter. There is generalized atrophy without lobar predilection. Fewer than 5 scattered microhemorrhages in a nonspecific pattern. Normal midline structures. Vascular: Normal flow voids. Skull and upper cervical spine: Normal marrow signal. Sinuses/Orbits: Negative. Other: None. MRA HEAD FINDINGS POSTERIOR CIRCULATION: --Vertebral arteries: Normal V4 segments. --Inferior cerebellar arteries: Normal. --Basilar artery: Normal. --Superior cerebellar arteries: Normal. --Posterior cerebral arteries: Normal. There are bilateral posterior communicating arteries (p-comm) that partially supply the PCAs. ANTERIOR CIRCULATION: --Intracranial internal carotid arteries: Normal. --Anterior cerebral arteries (ACA): Normal. Both A1 segments are present. Patent anterior communicating artery (a-comm). --Middle cerebral arteries (MCA): Normal. MRA NECK FINDINGS MRA of the neck is greatly degraded by motion. There is no stenosis at either carotid bifurcation. The V2 segments of the vertebral arteries are normal. Otherwise, visualization is poor. IMPRESSION: 1. No acute intracranial abnormality.  Normal intracranial MRA. 2. 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 3. Motion degraded MRA of the  neck without emergent large vessel occlusion or high-grade stenosis. Electronically Signed   By: Ulyses Jarred M.D.   On: 09/30/2019 19:05   MR ANGIO NECK WO CONTRAST  Result Date: 09/30/2019 CLINICAL DATA:  Acute neurologic deficit.  Stroke suspected. EXAM: MRI HEAD WITHOUT CONTRAST MRA HEAD WITHOUT CONTRAST MRA NECK WITHOUT CONTRAST TECHNIQUE: Multiplanar, multiecho pulse sequences of the brain and surrounding structures were obtained without intravenous contrast. Angiographic images of the Circle of Willis were obtained using MRA technique without intravenous contrast. Angiographic images of the neck were obtained using MRA technique without intravenous contrast. Carotid stenosis measurements (when applicable) are obtained utilizing NASCET criteria, using the distal internal carotid diameter as the denominator. COMPARISON:  None. FINDINGS: MRI HEAD FINDINGS Brain: No acute infarct, acute hemorrhage or extra-axial collection. Diffuse confluent hyperintense T2-weighted signal within the periventricular, deep and juxtacortical white matter. There is generalized atrophy without lobar predilection. Fewer than 5 scattered microhemorrhages in a nonspecific pattern. Normal midline structures. Vascular: Normal flow voids. Skull and upper cervical spine: Normal marrow signal. Sinuses/Orbits: Negative. Other: None. MRA HEAD FINDINGS POSTERIOR CIRCULATION: --Vertebral arteries: Normal V4 segments. --Inferior cerebellar arteries: Normal. --Basilar artery: Normal. --Superior cerebellar arteries: Normal. --Posterior cerebral arteries: Normal. There are bilateral posterior communicating arteries (p-comm) that partially supply the PCAs. ANTERIOR CIRCULATION: --Intracranial internal carotid arteries: Normal. --Anterior cerebral arteries (ACA): Normal. Both A1 segments are present. Patent anterior communicating artery (a-comm). --Middle cerebral arteries (MCA): Normal. MRA NECK FINDINGS MRA of the neck is greatly degraded by  motion. There is no stenosis at either carotid bifurcation. The V2 segments of the vertebral arteries are normal. Otherwise, visualization is poor. IMPRESSION: 1. No acute intracranial abnormality.  Normal intracranial MRA. 2. 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 3. Motion degraded MRA of the neck without emergent large vessel occlusion or high-grade stenosis. Electronically Signed   By: Ulyses Jarred M.D.   On: 09/30/2019 19:05   MR Brain Wo Contrast (neuro protocol)  Result Date: 09/30/2019 CLINICAL DATA:  Acute neurologic deficit.  Stroke suspected. EXAM: MRI HEAD WITHOUT CONTRAST MRA HEAD WITHOUT CONTRAST MRA NECK WITHOUT CONTRAST TECHNIQUE: Multiplanar, multiecho pulse sequences of the brain and surrounding structures were obtained without intravenous contrast. Angiographic images of the Circle of Willis were obtained using MRA technique without intravenous contrast. Angiographic images of the neck were obtained using MRA technique without intravenous contrast. Carotid stenosis measurements (when applicable) are obtained utilizing NASCET criteria, using the distal internal carotid diameter as the denominator. COMPARISON:  None. FINDINGS: MRI HEAD FINDINGS Brain: No acute infarct, acute hemorrhage or extra-axial collection. Diffuse confluent hyperintense T2-weighted signal within the periventricular, deep and juxtacortical white matter. There is generalized atrophy without lobar predilection. Fewer than 5 scattered microhemorrhages in a nonspecific pattern. Normal midline structures. Vascular: Normal flow voids. Skull and upper cervical spine: Normal marrow signal. Sinuses/Orbits: Negative. Other: None. MRA HEAD FINDINGS POSTERIOR CIRCULATION: --Vertebral arteries: Normal V4 segments. --Inferior cerebellar arteries: Normal. --Basilar artery: Normal. --Superior cerebellar arteries: Normal. --Posterior cerebral arteries: Normal. There are bilateral posterior communicating arteries  (p-comm) that partially supply the PCAs. ANTERIOR CIRCULATION: --Intracranial internal carotid arteries: Normal. --Anterior cerebral arteries (ACA): Normal. Both A1 segments are present. Patent anterior communicating artery (a-comm). --Middle cerebral arteries (MCA): Normal. MRA NECK FINDINGS MRA of the neck is greatly degraded by motion. There is no stenosis at either carotid bifurcation. The V2 segments of the vertebral arteries are normal. Otherwise, visualization is poor. IMPRESSION: 1. No acute intracranial abnormality.  Normal intracranial MRA. 2. 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 3. Motion degraded MRA of the neck without emergent large vessel occlusion or high-grade stenosis. Electronically Signed   By: Ulyses Jarred M.D.   On: 09/30/2019 19:05   DG Chest Port 1 View  Result Date: 09/30/2019 CLINICAL DATA:  Altered mental status EXAM: PORTABLE CHEST 1 VIEW COMPARISON:  05/28/2018 FINDINGS: Hypoventilation. Decreased lung volume with mild atelectasis in the bases. Negative for heart failure or pneumonia. No effusion. Vascular stent in the left upper arm. IMPRESSION: Hypoventilation.  No acute abnormality. Electronically Signed   By: Franchot Gallo M.D.   On: 09/30/2019 13:47    Medications:   . allopurinol  100 mg Oral Daily  . amLODipine  5 mg Oral Daily  . apixaban  5 mg Oral BID  . calcitRIOL  0.25 mcg Oral Daily  . Chlorhexidine Gluconate Cloth  6 each Topical Q0600  . cloNIDine  0.1 mg Oral TID  . ferric citrate  210 mg Oral TID WC  . hydrALAZINE  25 mg Oral TID  . lidocaine  1 patch Transdermal Q24H  . nebivolol  10 mg Oral QHS  . sodium chloride flush  3 mL Intravenous Once  . tamsulosin  0.4 mg Oral QHS   Continuous Infusions:   LOS: 1 day   [place] [facility]   How to contact the [facility] Attending or Consulting provider [place] or covering provider during after hours [place], for this patient?  1. Check the care team in [facility] and  look for a) attending/consulting [facility] provider listed and b) the [facility] team listed 2. Log into [email or web address] and use [place]'s universal password to access. If you do not have the password, please contact the hospital operator. 3. Locate the [facility] provider you are looking for under [facility] and page to a number that you can be directly reached. 4. If you still have difficulty reaching the provider, please page the [facility] (Director on Call) for the Hospitalists listed on amion for assistance.  10/02/2019, 11:23 AM

## 2019-10-02 NOTE — Progress Notes (Signed)
   10/02/19 1017  Clinical Encounter Type  Visited With Patient  Visit Type Initial  Referral From Other (Comment) (Rounding floor)  Consult/Referral To Chaplain  While rounding floor, and greeting, patient asked me to come in assist him with the telephone. Patient stated he was having a good day and felt well as could be. Offered prayer.This note was prepared by Jeanine Luz, M.Div..  For questions please contact by phone [phone].

## 2019-10-02 NOTE — Progress Notes (Signed)
Physical Therapy Treatment Patient Details Name: Duane Ortiz MRN: [id number] DOB: 10/22/1950 Today's Date: 10/02/2019    History of Present Illness 69 y.o. male with medical history significant of ESRD patient just started dialysis last week [facility] admission 9/17-9/21), HTN and HTN nephropathy, prior PE. Pt admitted to [facility] on 9/28 with acute encephalopathy suspect secondary to baclofen toxicity. MRI negative for acute changes.    PT Comments    Pt mobilizing much better today, only requiring light assist for transfer from sit to stand. Pt ambulated great hallway distance with use of RW and close guard for safety. Pt still remains slightly confused, but is oriented easily and hopefully cognition continues to improve during hospital course and with HD. Per CSM, pt declines SNF level of care post-acutely. PT encouraged pt to have assist in the home at least during mobility, per pt his girlfriend lives in the building and could stay with him if needed. PT updated recommendations to reflect HHPT and RW. Will continue to follow acutely.     Follow Up Recommendations  Home health PT;Supervision/Assistance - 24 hour      Equipment Recommendations  Rolling walker with 5" wheels    Recommendations for Other Services       Precautions / Restrictions Precautions Precautions: Fall Restrictions Weight Bearing Restrictions: No    Mobility  Bed Mobility Overal bed mobility: Needs Assistance Bed Mobility: Sit to Supine       Sit to supine: Min guard;HOB elevated   General bed mobility comments: for safety, pt able to scoot self up in bed with bridge motion. pt sitting EOB upon PT arrival to room.  Transfers Overall transfer level: Needs assistance Equipment used: Rolling walker (2 wheeled) Transfers: Sit to/from Stand Sit to Stand: Min assist         General transfer comment: min assist to power up from low surface, verbal cuing for hand placement when  rising.  Ambulation/Gait Ambulation/Gait assistance: Min guard Gait Distance (Feet): 175 Feet Assistive device: Rolling walker (2 wheeled) Gait Pattern/deviations: Step-through pattern;Decreased stride length;Trunk flexed Gait velocity: decr   General Gait Details: min guard for safety, verbal cuing for upright posture, placement in RW. No LOB noted.   Stairs             Wheelchair Mobility    Modified Rankin (Stroke Patients Only)       Balance Overall balance assessment: Needs assistance Sitting-balance support: No upper extremity supported;Feet supported Sitting balance-Leahy Scale: Good     Standing balance support: Bilateral upper extremity supported Standing balance-Leahy Scale: Poor Standing balance comment: reliant on external assist                            Cognition Arousal/Alertness: Awake/alert Behavior During Therapy: WFL for tasks assessed/performed Overall Cognitive Status: Impaired/Different from baseline Area of Impairment: Orientation;Following commands;Safety/judgement;Problem solving                 Orientation Level: Disoriented to;Time     Following Commands: Follows one step commands with increased time Safety/Judgement: Decreased awareness of deficits   Problem Solving: Requires tactile cues;Requires verbal cues General Comments: Pt stating year is in 41.... before stating it is 2019, PT corrected. Pt lacks insight into mobility deficits and impact on return to home.      Exercises      General Comments        Pertinent Vitals/Pain Pain Assessment: Faces Faces Pain Scale: Hurts a little  bit Pain Location: back, moving sit>supine Pain Descriptors / Indicators: Sore;Discomfort;Grimacing Pain Intervention(s): Limited activity within patient's tolerance;Monitored during session;Repositioned    Home Living                      Prior Function            PT Goals (current goals can now be found in  the care plan section) Acute Rehab PT Goals Patient Stated Goal: return to independence PT Goal Formulation: With patient Time For Goal Achievement: 10/15/19 Potential to Achieve Goals: Good Progress towards PT goals: Progressing toward goals    Frequency    Min 3X/week      PT Plan Discharge plan needs to be updated    Co-evaluation              AM-PAC PT "6 Clicks" Mobility   Outcome Measure  Help needed turning from your back to your side while in a flat bed without using bedrails?: A Little Help needed moving from lying on your back to sitting on the side of a flat bed without using bedrails?: A Little Help needed moving to and from a bed to a chair (including a wheelchair)?: A Little Help needed standing up from a chair using your arms (e.g., wheelchair or bedside chair)?: A Little Help needed to walk in hospital room?: A Little Help needed climbing 3-5 steps with a railing? : A Lot 6 Click Score: 17    End of Session   Activity Tolerance: Patient limited by fatigue Patient left: in bed;with call bell/phone within reach;with bed alarm set Nurse Communication: Mobility status PT Visit Diagnosis: Other abnormalities of gait and mobility (R26.89);Unsteadiness on feet (R26.81)     Time: 5361-4431 PT Time Calculation (min) (ACUTE ONLY): 10 min  Charges:  $Gait Training: 8-22 mins                     Saraih Lorton E, PT Acute Rehabilitation Services Pager [phone]  Office [phone]    Lakeesha Fontanilla D Birt Reinoso 10/02/2019, 5:00 PM

## 2019-10-02 NOTE — Plan of Care (Signed)

## 2019-10-02 NOTE — TOC Initial Note (Signed)
Transition of Care [facility]) - Initial/Assessment Note    Patient Details  Name: Duane Ortiz MRN: [id number] Date of Birth: 17-May-1950  Transition of Care [facility]) CM/SW Contact:    Joanne Chars, LCSW Phone Number: 10/02/2019, 2:51 PM  Clinical Narrative:      CSW met with pt and son Jiles Prows to discuss DC plan.  Pt declines offer of SNF placement and son supports this decision.  Pt lives alone in one of the [employer] apartments on [address], 8th floor, [address].  Pt has a girlfriend who lives in the same building and would be able to provide the 24/7 support for pt.  Son reports girlfriend is approx 60 years old and capable of assisting.  Son also reports he lives nearby and another son also lives nearby and are able to support pt.  Choice document for [facility] given and explained.  Permission received from pt to speak to son and [facility] agencies.  Pt is vaccinated.  Pt reports the only equipment he currently has is a cane.             Expected Discharge Plan: [place] Barriers to Discharge: Continued Medical Work up   Patient Goals and CMS Choice Patient states their goals for this hospitalization and ongoing recovery are:: "get myself all the way back." CMS Medicare.gov Compare Post Acute Care list provided to:: Patient Choice offered to / list presented to : Patient  Expected Discharge Plan and Services Expected Discharge Plan: [place] Choice: [place] arrangements for the past 2 months: Apartment                                      Prior Living Arrangements/Services Living arrangements for the past 2 months: Apartment Lives with:: Self Patient language and need for interpreter reviewed:: Yes Do you feel safe going back to the place where you live?: Yes      Need for Family Participation in Patient Care: Yes (Comment) Care giver support system in place?: Yes (comment)   Criminal Activity/Legal  Involvement Pertinent to Current Situation/Hospitalization: No - Comment as needed  Activities of Daily Living Home Assistive Devices/Equipment: None ADL Screening (condition at time of admission) Patient's cognitive ability adequate to safely complete daily activities?: Yes Is the patient deaf or have difficulty hearing?: No Does the patient have difficulty seeing, even when wearing glasses/contacts?: No Does the patient have difficulty concentrating, remembering, or making decisions?: No Patient able to express need for assistance with ADLs?: Yes Does the patient have difficulty dressing or bathing?: No Independently performs ADLs?: Yes (appropriate for developmental age) Does the patient have difficulty walking or climbing stairs?: Yes Weakness of Legs: Both Weakness of Arms/Hands: Both  Permission Sought/Granted Permission sought to share information with : Facility Sport and exercise psychologist, Family Supports Permission granted to share information with : Yes, Verbal Permission Granted  Share Information with NAME: Jiles Prows, son  Permission granted to share info w AGENCY: HH        Emotional Assessment Appearance:: Appears stated age Attitude/Demeanor/Rapport: Engaged Affect (typically observed): Pleasant Orientation: : Oriented to Self, Oriented to Place, Oriented to  Time Alcohol / Substance Use: Not Applicable Psych Involvement: No (comment)  Admission diagnosis:  Left-sided weakness [R53.1] Acute encephalopathy [G93.40] Altered mental status, unspecified altered mental status type [R41.82] AMS (altered mental status) [R41.82]  Patient Active Problem List   Diagnosis Date Noted  . AMS (altered mental status) 10/01/2019  . Acute encephalopathy 09/30/2019  . ESRD (end stage renal disease) ([place]) 09/21/2019  . Hyperkalemia, diminished renal excretion 09/21/2019  . Anemia 08/28/2019  . Disorder of mineral metabolism, unspecified 08/28/2019  . Hyperkalemia 08/28/2019  .  Hypomagnesemia 08/28/2019  . Metabolic acidosis 57/89/7847  . Mycosis 08/28/2019  . Tinea pedis 08/28/2019  . Peripheral neuropathy 04/02/2019  . Coagulation defect ([place]) 07/12/2018  . History of pulmonary embolism 07/12/2018  . Insomnia 07/12/2018  . Lumbar disc disease with radiculopathy 07/12/2018  . GERD (gastroesophageal reflux disease) 04/24/2018  . 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 Allergic rhinitis 03/01/2018  . Unstable gait 03/01/2018  . Chronic lower back pain 02/13/2018  . Gout involving toe of left foot 02/13/2018  . History of alcohol abuse 02/13/2018  . Overweight (BMI 25.0-29.9) 02/13/2018  . Vitamin D deficiency 02/13/2018  . Essential hypertension 04/27/2017  . Type 2 diabetes mellitus with renal complication ([place]) 84/12/8206  . Gait abnormality 10/11/2016  . Diabetic peripheral neuropathy ([place]) 08/25/2016  . Disc degeneration, lumbar 08/25/2016  . 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 Dyspnea on exertion 05/22/2015  . Hypercholesteremia   . Gout   . Pulmonary embolism ([place])   . Chronic kidney disease   . Hypercholesterolemia    PCP:  Sandi Mariscal, MD Pharmacy:   [facility] [place], [place] [address] Phone: [phone] Fax: [phone]     Social Determinants of Health (SDOH) Interventions    Readmission Risk Interventions No flowsheet data found.

## 2019-10-03 DIAGNOSIS — G92 Toxic encephalopathy: Secondary | ICD-10-CM | POA: Diagnosis not present

## 2019-10-03 DIAGNOSIS — G9340 Encephalopathy, unspecified: Secondary | ICD-10-CM | POA: Diagnosis not present

## 2019-10-03 DIAGNOSIS — R531 Weakness: Secondary | ICD-10-CM | POA: Diagnosis not present

## 2019-10-03 DIAGNOSIS — R4182 Altered mental status, unspecified: Secondary | ICD-10-CM | POA: Diagnosis not present

## 2019-10-03 LAB — CBC
HCT: 30.5 % — ABNORMAL LOW (ref 39.0–52.0)
Hemoglobin: 9 g/dL — ABNORMAL LOW (ref 13.0–17.0)
MCH: 27.6 pg (ref 26.0–34.0)
MCHC: 29.5 g/dL — ABNORMAL LOW (ref 30.0–36.0)
MCV: 93.6 fL (ref 80.0–100.0)
Platelets: 170 10*3/uL (ref 150–400)
RBC: 3.26 MIL/uL — ABNORMAL LOW (ref 4.22–5.81)
RDW: 14.1 % (ref 11.5–15.5)
WBC: 6.3 10*3/uL (ref 4.0–10.5)
nRBC: 0.3 % — ABNORMAL HIGH (ref 0.0–0.2)

## 2019-10-03 LAB — BASIC METABOLIC PANEL
Anion gap: 17 — ABNORMAL HIGH (ref 5–15)
BUN: 55 mg/dL — ABNORMAL HIGH (ref 8–23)
CO2: 21 mmol/L — ABNORMAL LOW (ref 22–32)
Calcium: 8.4 mg/dL — ABNORMAL LOW (ref 8.9–10.3)
Chloride: 100 mmol/L (ref 98–111)
Creatinine, Ser: 9.45 mg/dL — ABNORMAL HIGH (ref 0.61–1.24)
GFR calc Af Amer: 6 mL/min — ABNORMAL LOW (ref >60)
GFR calc non Af Amer: 5 mL/min — ABNORMAL LOW (ref >60)
Glucose, Bld: 96 mg/dL (ref 70–99)
Potassium: 3.7 mmol/L (ref 3.5–5.1)
Sodium: 138 mmol/L (ref 135–145)

## 2019-10-03 NOTE — Discharge Summary (Signed)
[facility]  Physician Discharge Summary   Patient ID: Duane Ortiz MRN: [id number] DOB/AGE: [birth year] 71 y.o.  Admit date: 09/30/2019 Discharge date: 10/03/2019  PCP: Sandi Mariscal, MD  DISCHARGE DIAGNOSES:  Acute metabolic encephalopathy likely due to baclofen, resolved Essential hypertension End-stage renal disease with hemodialysis History of pulmonary embolism Anemia of chronic disease  RECOMMENDATIONS FOR OUTPATIENT FOLLOW UP: 1. Baclofen has been discontinued 2. Home health has been ordered   Home Health: PT OT RN Equipment/Devices: Rolling walker  CODE STATUS: Full code  DISCHARGE CONDITION: fair  Diet recommendation: As before  INITIAL HISTORY: Duane Ortiz is an 69 y.o. male with medical history significant ofESRD patient just started dialysis last week, HTN and HTN nephropathy, prior PE now on eliquis.  Pt recently admitted to hospital 9/17-9/21 to start dialysis.  During that admit: Xarelto was changed to eliquis, BP medications were changed (increased) due to uncontrolled HTN.  Pt presented to ED with confusion. Pt LKW 2 days ago. Last dialysis was on Sat. No falls that family is aware of. Per pt son report to EDP: He apparently spent the afternoon with a friend who thought he was acting abnormally over the weekend and then this AM patient's son found him to be very confused. He states the patient was downstairs in his underwear, was confused about his location, and is having trouble communicating. No alleviating/aggravating factors.   HOSPITAL COURSE:   Acute metabolic encephalopathy Reason for his encephalopathy is not entirely clear.  Patient did not have any focal deficits.  MRI was negative for stroke.  No evidence for infection.  Apparently prior to admission he was walking with a cane and was also driving.  Baclofen toxicity is considered a likely reason.  This has been stopped.  He has been on hemodialysis only for about a week or so.   No dialysis related etiology for his presentation.  Patient's mentation has improved.  Seems to be close to baseline.  No seizure activity noted.  Seen initially by PT OT and SNF was recommended.  However patient declines wants to go home.  Reevaluated by PT and they feel that home health may be more appropriate.  This has been ordered.  Discussed with patient's son as well.  Essential hypertension Patient currently noted to be on amlodipine, clonidine, hydralazine and Bystolic.  ESRD on hemodialysis Tuesday Thursday Saturday.   Seen by nephrology and was dialyzed.  History of pulmonary embolism Stable.  Continue apixaban.  Back pain Baclofen discontinued.  Anemia of chronic disease Hemoglobin stable.  No evidence for overt bleeding.   Overall patient remained stable.  Okay for discharge home today with family.  Home health has been ordered.   PERTINENT LABS:  The results of significant diagnostics from this hospitalization (including imaging, microbiology, ancillary and laboratory) are listed below for reference.    Microbiology: Recent Results (from the past 240 hour(s))  Respiratory Panel by RT PCR (Flu A&B, Covid) - Nasopharyngeal Swab     Status: None   Collection Time: 09/30/19  8:45 PM   Specimen: Nasopharyngeal Swab  Result Value Ref Range Status   SARS Coronavirus 2 by RT PCR NEGATIVE NEGATIVE Final    Comment: (NOTE) SARS-CoV-2 target nucleic acids are NOT DETECTED.  The SARS-CoV-2 RNA is generally detectable in upper respiratoy specimens during the acute phase of infection. The lowest concentration of SARS-CoV-2 viral copies this assay can detect is 131 copies/mL. A negative result does not preclude SARS-Cov-2 infection and should not be used as  the sole basis for treatment or other patient management decisions. A negative result may occur with  improper specimen collection/handling, submission of specimen other than nasopharyngeal swab, presence of viral  mutation(s) within the areas targeted by this assay, and inadequate number of viral copies (<131 copies/mL). A negative result must be combined with clinical observations, patient history, and epidemiological information. The expected result is Negative.  Fact Sheet for Patients:  PinkCheek.be  Fact Sheet for Healthcare Providers:  GravelBags.it  This test is no t yet approved or cleared by the [place] FDA and  has been authorized for detection and/or diagnosis of SARS-CoV-2 by FDA under an Emergency Use Authorization (EUA). This EUA will remain  in effect (meaning this test can be used) for the duration of the COVID-19 declaration under Section 564(b)(1) of the Act, 21 U.S.C. section 360bbb-3(b)(1), unless the authorization is terminated or revoked sooner.     Influenza A by PCR NEGATIVE NEGATIVE Final   Influenza B by PCR NEGATIVE NEGATIVE Final    Comment: (NOTE) The Xpert Xpress SARS-CoV-2/FLU/RSV assay is intended as an aid in  the diagnosis of influenza from Nasopharyngeal swab specimens and  should not be used as a sole basis for treatment. Nasal washings and  aspirates are unacceptable for Xpert Xpress SARS-CoV-2/FLU/RSV  testing.  Fact Sheet for Patients: PinkCheek.be  Fact Sheet for Healthcare Providers: GravelBags.it  This test is not yet approved or cleared by the [place] FDA and  has been authorized for detection and/or diagnosis of SARS-CoV-2 by  FDA under an Emergency Use Authorization (EUA). This EUA will remain  in effect (meaning this test can be used) for the duration of the  Covid-19 declaration under Section 564(b)(1) of the Act, 21  U.S.C. section 360bbb-3(b)(1), unless the authorization is  terminated or revoked. Performed at [facility], [place] [address] [place], [place] [address]      Labs:    Basic Metabolic  Panel: Recent Labs  Lab 09/30/19 1204 09/30/19 1417 10/01/19 0325 10/02/19 1135 10/03/19 0122  NA 140 136 138 135 138  K 4.0 3.8 3.2* 3.4* 3.7  CL 98 101 98 95* 100  CO2 22  --  22 23 21*  GLUCOSE 98 87 88 212* 96  BUN 44* 47* 50* 37* 55*  CREATININE 9.25* 9.90* 9.78* 8.46* 9.45*  CALCIUM 8.9  --  8.5* 8.4* 8.4*  PHOS  --   --   --  5.3*  --    Liver Function Tests: Recent Labs  Lab 09/30/19 1204 10/02/19 1135  AST 22  --   ALT 20  --   ALKPHOS 59  --   BILITOT 0.7  --   PROT 7.6  --   ALBUMIN 3.9 3.1*    Recent Labs  Lab 09/30/19 1504  AMMONIA 28   CBC: Recent Labs  Lab 09/30/19 1204 09/30/19 1417 10/01/19 0325 10/03/19 0122  WBC 9.4  --  6.9 6.3  NEUTROABS 5.9  --   --   --   HGB 9.9* 11.6* 9.4* 9.0*  HCT 34.6* 34.0* 31.0* 30.5*  MCV 96.1  --  94.5 93.6  PLT 210  --  208 170    CBG: Recent Labs  Lab 09/30/19 1157 09/30/19 1507 10/01/19 0850  GLUCAP 96 89 123*     IMAGING STUDIES CT HEAD WO CONTRAST  Result Date: 09/30/2019 CLINICAL DATA:  Neuro deficit, acute, stroke suspected. Additional history provided: Patient presents with stroke like symptoms, repeating his name  and unable to follow commands. EXAM: CT HEAD WITHOUT CONTRAST TECHNIQUE: Contiguous axial images were obtained from the base of the skull through the vertex without intravenous contrast. COMPARISON:  No pertinent prior exams are available for comparison. FINDINGS: Brain: Mild-to-moderate generalized cerebral atrophy. Advanced ill-defined hypoattenuation within the cerebral white matter which is nonspecific, but consistent with chronic small vessel ischemic disease. There is no acute intracranial hemorrhage. No demarcated cortical infarct. No extra-axial fluid collection. No evidence of intracranial mass. No midline shift. Vascular: No hyperdense vessel. Skull: Normal. Negative for fracture or focal lesion. Sinuses/Orbits: Visualized orbits show no acute finding. No significant paranasal  sinus disease or mastoid effusion at the imaged levels. Other: Presumed chronic fracture deformity of the left nasal bone without appreciable overlying soft tissue swelling (series 4, image 10). IMPRESSION: No CT evidence of acute intracranial abnormality. Advanced cerebral white matter chronic small vessel ischemic disease. Mild-to-moderate generalized cerebral atrophy. A fracture deformity of the left nasal bone is presumed chronic. However, clinical correlation is recommended. Electronically Signed   By: Kellie Simmering DO   On: 09/30/2019 12:57   MR ANGIO HEAD WO CONTRAST  Result Date: 09/30/2019 CLINICAL DATA:  Acute neurologic deficit.  Stroke suspected. EXAM: MRI HEAD WITHOUT CONTRAST MRA HEAD WITHOUT CONTRAST MRA NECK WITHOUT CONTRAST TECHNIQUE: Multiplanar, multiecho pulse sequences of the brain and surrounding structures were obtained without intravenous contrast. Angiographic images of the Circle of Willis were obtained using MRA technique without intravenous contrast. Angiographic images of the neck were obtained using MRA technique without intravenous contrast. Carotid stenosis measurements (when applicable) are obtained utilizing NASCET criteria, using the distal internal carotid diameter as the denominator. COMPARISON:  None. FINDINGS: MRI HEAD FINDINGS Brain: No acute infarct, acute hemorrhage or extra-axial collection. Diffuse confluent hyperintense T2-weighted signal within the periventricular, deep and juxtacortical white matter. There is generalized atrophy without lobar predilection. Fewer than 5 scattered microhemorrhages in a nonspecific pattern. Normal midline structures. Vascular: Normal flow voids. Skull and upper cervical spine: Normal marrow signal. Sinuses/Orbits: Negative. Other: None. MRA HEAD FINDINGS POSTERIOR CIRCULATION: --Vertebral arteries: Normal V4 segments. --Inferior cerebellar arteries: Normal. --Basilar artery: Normal. --Superior cerebellar arteries: Normal. --Posterior  cerebral arteries: Normal. There are bilateral posterior communicating arteries (p-comm) that partially supply the PCAs. ANTERIOR CIRCULATION: --Intracranial internal carotid arteries: Normal. --Anterior cerebral arteries (ACA): Normal. Both A1 segments are present. Patent anterior communicating artery (a-comm). --Middle cerebral arteries (MCA): Normal. MRA NECK FINDINGS MRA of the neck is greatly degraded by motion. There is no stenosis at either carotid bifurcation. The V2 segments of the vertebral arteries are normal. Otherwise, visualization is poor. IMPRESSION: 1. No acute intracranial abnormality.  Normal intracranial MRA. 2. 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 3. Motion degraded MRA of the neck without emergent large vessel occlusion or high-grade stenosis. Electronically Signed   By: Ulyses Jarred M.D.   On: 09/30/2019 19:05   MR ANGIO NECK WO CONTRAST  Result Date: 09/30/2019 CLINICAL DATA:  Acute neurologic deficit.  Stroke suspected. EXAM: MRI HEAD WITHOUT CONTRAST MRA HEAD WITHOUT CONTRAST MRA NECK WITHOUT CONTRAST TECHNIQUE: Multiplanar, multiecho pulse sequences of the brain and surrounding structures were obtained without intravenous contrast. Angiographic images of the Circle of Willis were obtained using MRA technique without intravenous contrast. Angiographic images of the neck were obtained using MRA technique without intravenous contrast. Carotid stenosis measurements (when applicable) are obtained utilizing NASCET criteria, using the distal internal carotid diameter as the denominator. COMPARISON:  None. FINDINGS: MRI HEAD FINDINGS Brain: No acute  infarct, acute hemorrhage or extra-axial collection. Diffuse confluent hyperintense T2-weighted signal within the periventricular, deep and juxtacortical white matter. There is generalized atrophy without lobar predilection. Fewer than 5 scattered microhemorrhages in a nonspecific pattern. Normal midline structures.  Vascular: Normal flow voids. Skull and upper cervical spine: Normal marrow signal. Sinuses/Orbits: Negative. Other: None. MRA HEAD FINDINGS POSTERIOR CIRCULATION: --Vertebral arteries: Normal V4 segments. --Inferior cerebellar arteries: Normal. --Basilar artery: Normal. --Superior cerebellar arteries: Normal. --Posterior cerebral arteries: Normal. There are bilateral posterior communicating arteries (p-comm) that partially supply the PCAs. ANTERIOR CIRCULATION: --Intracranial internal carotid arteries: Normal. --Anterior cerebral arteries (ACA): Normal. Both A1 segments are present. Patent anterior communicating artery (a-comm). --Middle cerebral arteries (MCA): Normal. MRA NECK FINDINGS MRA of the neck is greatly degraded by motion. There is no stenosis at either carotid bifurcation. The V2 segments of the vertebral arteries are normal. Otherwise, visualization is poor. IMPRESSION: 1. No acute intracranial abnormality.  Normal intracranial MRA. 2. 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 3. Motion degraded MRA of the neck without emergent large vessel occlusion or high-grade stenosis. Electronically Signed   By: Ulyses Jarred M.D.   On: 09/30/2019 19:05   MR Brain Wo Contrast (neuro protocol)  Result Date: 09/30/2019 CLINICAL DATA:  Acute neurologic deficit.  Stroke suspected. EXAM: MRI HEAD WITHOUT CONTRAST MRA HEAD WITHOUT CONTRAST MRA NECK WITHOUT CONTRAST TECHNIQUE: Multiplanar, multiecho pulse sequences of the brain and surrounding structures were obtained without intravenous contrast. Angiographic images of the Circle of Willis were obtained using MRA technique without intravenous contrast. Angiographic images of the neck were obtained using MRA technique without intravenous contrast. Carotid stenosis measurements (when applicable) are obtained utilizing NASCET criteria, using the distal internal carotid diameter as the denominator. COMPARISON:  None. FINDINGS: MRI HEAD FINDINGS  Brain: No acute infarct, acute hemorrhage or extra-axial collection. Diffuse confluent hyperintense T2-weighted signal within the periventricular, deep and juxtacortical white matter. There is generalized atrophy without lobar predilection. Fewer than 5 scattered microhemorrhages in a nonspecific pattern. Normal midline structures. Vascular: Normal flow voids. Skull and upper cervical spine: Normal marrow signal. Sinuses/Orbits: Negative. Other: None. MRA HEAD FINDINGS POSTERIOR CIRCULATION: --Vertebral arteries: Normal V4 segments. --Inferior cerebellar arteries: Normal. --Basilar artery: Normal. --Superior cerebellar arteries: Normal. --Posterior cerebral arteries: Normal. There are bilateral posterior communicating arteries (p-comm) that partially supply the PCAs. ANTERIOR CIRCULATION: --Intracranial internal carotid arteries: Normal. --Anterior cerebral arteries (ACA): Normal. Both A1 segments are present. Patent anterior communicating artery (a-comm). --Middle cerebral arteries (MCA): Normal. MRA NECK FINDINGS MRA of the neck is greatly degraded by motion. There is no stenosis at either carotid bifurcation. The V2 segments of the vertebral arteries are normal. Otherwise, visualization is poor. IMPRESSION: 1. No acute intracranial abnormality.  Normal intracranial MRA. 2. 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 3. Motion degraded MRA of the neck without emergent large vessel occlusion or high-grade stenosis. Electronically Signed   By: Ulyses Jarred M.D.   On: 09/30/2019 19:05     DG Chest Port 1 View  Result Date: 09/30/2019 CLINICAL DATA:  Altered mental status EXAM: PORTABLE CHEST 1 VIEW COMPARISON:  05/28/2018 FINDINGS: Hypoventilation. Decreased lung volume with mild atelectasis in the bases. Negative for heart failure or pneumonia. No effusion. Vascular stent in the left upper arm. IMPRESSION: Hypoventilation.  No acute abnormality. Electronically Signed   By: Franchot Gallo  M.D.   On: 09/30/2019 13:47     DISCHARGE EXAMINATION: Vitals:   10/03/19 0930 10/03/19 1000 10/03/19 1030 10/03/19 1050  BP:  114/60 119/62 113/63   Pulse: 61 (!) 59 60 60  Resp: 15 15 15    Temp:    97.9 F (36.6 C)  TempSrc:    Oral  SpO2:    100%  Weight:    82.2 kg   General appearance: Awake alert.  In no distress Resp: Clear to auscultation bilaterally.  Normal effort Cardio: S1-S2 is normal regular.  No S3-S4.  No rubs murmurs or bruit GI: Abdomen is soft.  Nontender nondistended.  Bowel sounds are present normal.  No masses organomegaly   DISPOSITION: Home with son  Discharge Instructions    Call MD for:  difficulty breathing, headache or visual disturbances   Complete by: As directed    Call MD for:  extreme fatigue   Complete by: As directed    Call MD for:  persistant dizziness or light-headedness   Complete by: As directed    Call MD for:  persistant nausea and vomiting   Complete by: As directed    Call MD for:  severe uncontrolled pain   Complete by: As directed    Call MD for:  temperature >100.4   Complete by: As directed    Diet - low sodium heart healthy   Complete by: As directed    Discharge instructions   Complete by: As directed    Please take your medications as prescribed.  Keep up with your dialysis schedule.  Please stop taking the baclofen.  You were cared for by a hospitalist during your hospital stay. If you have any questions about your discharge medications or the care you received while you were in the hospital after you are discharged, you can call the unit and asked to speak with the hospitalist on call if the hospitalist that took care of you is not available. Once you are discharged, your primary care physician will handle any further medical issues. Please note that NO REFILLS for any discharge medications will be authorized once you are discharged, as it is imperative that you return to your primary care physician (or establish a  relationship with a primary care physician if you do not have one) for your aftercare needs so that they can reassess your need for medications and monitor your lab values. If you do not have a primary care physician, you can call [phone] for a physician referral.   Increase activity slowly   Complete by: As directed         Allergies as of 10/03/2019      Reactions   Gabapentin    Felt like I was out of my head, slept all day, confusion    Sulfa Antibiotics Itching      Medication List    TAKE these medications   allopurinol 100 MG tablet Commonly known as: ZYLOPRIM Take 100 mg by mouth daily.   amLODipine 5 MG tablet Commonly known as: NORVASC Take 1 tablet (5 mg total) by mouth daily.   apixaban 5 MG Tabs tablet Commonly known as: ELIQUIS Take 1 tablet (5 mg total) by mouth 2 (two) times daily.   Auryxia 1 GM 210 MG(Fe) tablet Generic drug: ferric citrate Take 210 mg by mouth 3 (three) times daily with meals.   calcitRIOL 0.25 MCG capsule Commonly known as: ROCALTROL Take 0.25 mcg by mouth daily.   cloNIDine 0.1 MG tablet Commonly known as: CATAPRES Take 1 tablet (0.1 mg total) by mouth 3 (three) times daily.   ergocalciferol 1.25 MG (50000 UT) capsule Commonly known as:  VITAMIN D2 Take 50,000 Units by mouth once a week.   hydrALAZINE 25 MG tablet Commonly known as: APRESOLINE Take 1 tablet (25 mg total) by mouth 3 (three) times daily.   nebivolol 10 MG tablet Commonly known as: BYSTOLIC Take 10 mg by mouth at bedtime.   oxyCODONE-acetaminophen 5-325 MG tablet Commonly known as: Percocet Take 1 tablet by mouth every 4 (four) hours as needed for severe pain.   sodium bicarbonate 650 MG tablet Take 650 mg by mouth 2 (two) times daily.   tamsulosin 0.4 MG Caps capsule Commonly known as: FLOMAX Take 0.4 mg by mouth at bedtime.   VITAMIN E PO Take 1 capsule by mouth daily.            Durable Medical Equipment  (From admission, onward)          Start     Ordered   10/03/19 1119  For home use only DME Walker rolling  Once       Question Answer Comment  Walker: With [place] Wheels   Patient needs a walker to treat with the following condition Physical deconditioning      10/03/19 1118            Follow-up Information    Sandi Mariscal, MD. Schedule an appointment as soon as possible for a visit in 1 week(s).   Specialty: Internal Medicine Contact information: [place] [address] [phone]        [place] Follow up.   Why: [facility] will call you within 24-48 hours to schedule your first appointment.  Contact information: [address],  [place], [place] [address] [phone]              TOTAL DISCHARGE TIME: 35 minutes  [facility] Pager on [email or web address]  10/03/2019, 12:57 PM

## 2019-10-03 NOTE — TOC Transition Note (Addendum)
Transition of Care [facility]) - CM/SW Discharge Note   Patient Details  Name: Mauri Tolen MRN: [id number] Date of Birth: [birth year]  [facility]) CM/SW Contact:  Joanne Chars, LCSW Phone Number: 10/03/2019, 12:04 PM   Clinical Narrative:   Pt will DC with [facility].  Rolling walker ordered through [employer]. CSW unable to speak with anyone at [facility] to schedule PCP--only able to leave message. No other needs identified.   1300: TC [place], [employer].  Insurance shows pt received rollator in June.  CSW spoke with pt and son, both deny pt received rollator in June.  Son will check pt apartment.  Danielle agreed that son could have her number and he will call if they still need one.     Final next level of care: [place] Barriers to Discharge: Barriers Resolved   Patient Goals and CMS Choice Patient states their goals for this hospitalization and ongoing recovery are:: "get myself all the way back." CMS Medicare.gov Compare Post Acute Care list provided to:: Patient Choice offered to / list presented to : Patient  Discharge Placement                       Discharge Plan and Services     Post Acute Care Choice: Home Health          DME Arranged: Walker rolling DME Agency: [employer] Date DME Agency Contacted: 10/03/19 Time DME Agency Contacted: 1150 Representative spoke with at DME Agency: [place] phone line [place] Arranged: PT [place] Agency: [facility] Date [place]: 10/03/19 Time [place]: 9233 Representative spoke with at [place]: [place] ([place]) Interventions     Readmission Risk Interventions Readmission Risk Prevention Plan 10/03/2019  Transportation Screening Complete  PCP or Specialist Appt within 3-5 Days Not Complete  HRI or [place] Complete  Social Work Consult for [place] Planning/Counseling Complete  Palliative Care Screening Not Applicable   Medication Review Press photographer) Complete  Some recent data might be hidden

## 2019-10-03 NOTE — Progress Notes (Signed)
Nutrition Education Note  RD consulted for Renal Education. Attempted to call pt/family member, no answer. Of note, patient underwent recent renal/ carb modified diet education on 9/21. Handouts provided.   Pt to d/c home today. Pt/family will need to discuss specific diet questions/concerns with RD at HD outpatient facility.    Mariana Single RD, LDN Clinical Nutrition Pager listed in [place]

## 2019-10-03 NOTE — Progress Notes (Signed)
Pt came back from HD alert and no complaints

## 2019-10-03 NOTE — TOC Progression Note (Signed)
Transition of Care [facility]) - Progression Note    Patient Details  Name: Duane Ortiz MRN: [id number] Date of Birth: 03-05-1950  Transition of Care [facility]) CM/SW Contact  Joanne Chars, LCSW Phone Number: 10/03/2019, 10:50 AM  Clinical Narrative:   [place] picks pt up for [facility] PT.    Expected Discharge Plan: [place] Barriers to Discharge: Continued Medical Work up  Expected Discharge Plan and Services Expected Discharge Plan: [place] Choice: [place] arrangements for the past 2 months: Apartment                                       Social Determinants of Health (SDOH) Interventions    Readmission Risk Interventions No flowsheet data found.

## 2019-10-03 NOTE — Discharge Instructions (Addendum)
Information on my medicine - ELIQUIS® (apixaban) ° °This medication education was reviewed with me or my healthcare representative as part of my discharge preparation.   ° °Why was Eliquis® prescribed for you? °Eliquis® was prescribed to treat blood clots that may have been found in the veins of your legs (deep vein thrombosis) or in your lungs (pulmonary embolism) and to reduce the risk of them occurring again. ° °What do You need to know about Eliquis® ? °The the dose is ONE 5 mg tablet taken TWICE daily.  Eliquis® may be taken with or without food.  ° °Try to take the dose about the same time in the morning and in the evening. If you have difficulty swallowing the tablet whole please discuss with your pharmacist how to take the medication safely. ° °Take Eliquis® exactly as prescribed and DO NOT stop taking Eliquis® without talking to the doctor who prescribed the medication.  Stopping may increase your risk of developing a new blood clot.  Refill your prescription before you run out. ° °After discharge, you should have regular check-up appointments with your healthcare provider that is prescribing your Eliquis®. °   °What do you do if you miss a dose? °If a dose of ELIQUIS® is not taken at the scheduled time, take it as soon as possible on the same day and twice-daily administration should be resumed. The dose should not be doubled to make up for a missed dose. ° °Important Safety Information °A possible side effect of Eliquis® is bleeding. You should call your healthcare provider right away if you experience any of the following: °? Bleeding from an injury or your nose that does not stop. °? Unusual colored urine (red or dark brown) or unusual colored stools (red or black). °? Unusual bruising for unknown reasons. °? A serious fall or if you hit your head (even if there is no bleeding). ° °Some medicines may interact with Eliquis® and might increase your risk of bleeding or clotting while on Eliquis®. To help  avoid this, consult your healthcare provider or pharmacist prior to using any new prescription or non-prescription medications, including herbals, vitamins, non-steroidal anti-inflammatory drugs (NSAIDs) and supplements. ° °This website has more information on Eliquis® (apixaban): http://www.eliquis.com/eliquis/home ° °

## 2019-10-03 NOTE — Progress Notes (Signed)
[facility] Progress Note  Name: Duane Ortiz MRN: [id number] DOB: 06-10-1950  Chief Complaint:  ams  Subjective:  Seen and examined on dialysis.  Blood pressure 108/59 and HR 54.  Goal reduced - quite a bit below EDW and no evidence of overload.  States might be going home today.   Review of systems:    Denies shortness of breath or chest pain  Denies n/v   Intake/Output Summary (Last 24 hours) at 10/03/2019 0800 Last data filed at 10/02/2019 2259 Gross per 24 hour  Intake --  Output 500 ml  Net -500 ml    Vitals:  Vitals:   10/03/19 0720 10/03/19 0725 10/03/19 0730 10/03/19 0745  BP: 136/61 129/62  (!) 111/58  Pulse: (!) 57 (!) 57 (!) 57 (!) 58  Resp: 15 14  15   Temp:      TempSrc:      SpO2:      Weight:         Physical Exam:  General adult male in bed in no acute distress  HEENT normocephalic atraumatic extraocular movements intact sclera anicteric Neck supple trachea midline Lungs clear to auscultation bilaterally normal work of breathing at rest  Heart S1S2 no rub Abdomen soft nontender nondistended Extremities no edema  Psych no anxiety or agitation  Neuro awake and conversant  Access LUE AVF bruit and thrill   Medications reviewed   Labs:  BMP Latest Ref Rng & Units 10/03/2019 10/02/2019 10/01/2019  Glucose 70 - 99 mg/dL 96 212(H) 88  BUN 8 - 23 mg/dL 55(H) 37(H) 50(H)  Creatinine 0.61 - 1.24 mg/dL 9.45(H) 8.46(H) 9.78(H)  Sodium 135 - 145 mmol/L 138 135 138  Potassium 3.5 - 5.1 mmol/L 3.7 3.4(L) 3.2(L)  Chloride 98 - 111 mmol/L 100 95(L) 98  CO2 22 - 32 mmol/L 21(L) 23 22  Calcium 8.9 - 10.3 mg/dL 8.4(L) 8.4(L) 8.5(L)    NW TTS 180 BFR 400 DFR 800 2K/2.5Ca  EDW 85.5kg, post wt Thurs 87, post wt Sat 89.5kg Got mircera 100 mcg on 9/23  Assessment/Plan:   **AMS: eval to date largely unrevealing.  This is not consistent with dialysis disequilibrium or uremia. May be baclofen toxicity with 10mg  dose on his med list - improving;  work-up per primary team   **ESRD on HD:  - HD per TTS schedule   **Anemia of CKD - no urgent need for PRBC's.  Got mircera 100 mcg on 9/23  **HTN:  follow on current regimen  **secondary hyperparathyroidism: on calcitriol; will be dosed at outpatient HD unit three times a week on discharge.  On auryxia TID AC for hyperphos.   **h/o PE: eliquis; anticoagulation per primary team     Disposition per primary team.  Adjusting to HD has been hard.  I have recommended that he discuss this with his family.   Claudia Desanctis, MD 10/03/2019 8:00 AM

## 2019-10-04 ENCOUNTER — Telehealth: Payer: Self-pay | Admitting: Physician Assistant

## 2019-10-04 NOTE — Telephone Encounter (Signed)
Transition of care contact from inpatient facility  Date of Discharge: 10/03/19 Date of Contact: 10/04/19 Method of contact: Phone  Attempted to contact patient to discuss transition of care from inpatient admission. Patient did not answer the phone. Message was left on the patient's voicemail with call back instructions.  Anice Paganini, PA-C 10/04/2019, 2:09 PM  [employer]

## 2019-10-24 ENCOUNTER — Emergency Department (HOSPITAL_COMMUNITY): Payer: Medicare HMO

## 2019-10-24 ENCOUNTER — Other Ambulatory Visit: Payer: Self-pay

## 2019-10-24 ENCOUNTER — Inpatient Hospital Stay (HOSPITAL_COMMUNITY)
Admission: EM | Admit: 2019-10-24 | Discharge: 2019-11-01 | DRG: 070 | Disposition: A | Discharge status: Home Health | Payer: Medicare HMO | Attending: Internal Medicine | Admitting: Internal Medicine

## 2019-10-24 ENCOUNTER — Encounter (HOSPITAL_COMMUNITY): Payer: Self-pay | Admitting: Emergency Medicine

## 2019-10-24 DIAGNOSIS — Z781 Physical restraint status: Secondary | ICD-10-CM

## 2019-10-24 DIAGNOSIS — Z66 Do not resuscitate: Secondary | ICD-10-CM | POA: Diagnosis not present

## 2019-10-24 DIAGNOSIS — Z20822 Contact with and (suspected) exposure to covid-19: Secondary | ICD-10-CM | POA: Diagnosis present

## 2019-10-24 DIAGNOSIS — G4700 Insomnia, unspecified: Secondary | ICD-10-CM | POA: Diagnosis present

## 2019-10-24 DIAGNOSIS — E1122 Type 2 diabetes mellitus with diabetic chronic kidney disease: Secondary | ICD-10-CM | POA: Diagnosis present

## 2019-10-24 DIAGNOSIS — Z833 Family history of diabetes mellitus: Secondary | ICD-10-CM

## 2019-10-24 DIAGNOSIS — Z86711 Personal history of pulmonary embolism: Secondary | ICD-10-CM

## 2019-10-24 DIAGNOSIS — R4189 Other symptoms and signs involving cognitive functions and awareness: Secondary | ICD-10-CM | POA: Diagnosis present

## 2019-10-24 DIAGNOSIS — E872 Acidosis: Secondary | ICD-10-CM | POA: Diagnosis present

## 2019-10-24 DIAGNOSIS — Z882 Allergy status to sulfonamides status: Secondary | ICD-10-CM

## 2019-10-24 DIAGNOSIS — D631 Anemia in chronic kidney disease: Secondary | ICD-10-CM | POA: Diagnosis present

## 2019-10-24 DIAGNOSIS — M109 Gout, unspecified: Secondary | ICD-10-CM | POA: Diagnosis present

## 2019-10-24 DIAGNOSIS — M1990 Unspecified osteoarthritis, unspecified site: Secondary | ICD-10-CM | POA: Diagnosis present

## 2019-10-24 DIAGNOSIS — R4587 Impulsiveness: Secondary | ICD-10-CM | POA: Diagnosis present

## 2019-10-24 DIAGNOSIS — Z992 Dependence on renal dialysis: Secondary | ICD-10-CM

## 2019-10-24 DIAGNOSIS — Z9115 Patient's noncompliance with renal dialysis: Secondary | ICD-10-CM | POA: Diagnosis not present

## 2019-10-24 DIAGNOSIS — F17210 Nicotine dependence, cigarettes, uncomplicated: Secondary | ICD-10-CM | POA: Diagnosis present

## 2019-10-24 DIAGNOSIS — F05 Delirium due to known physiological condition: Secondary | ICD-10-CM | POA: Diagnosis not present

## 2019-10-24 DIAGNOSIS — G4733 Obstructive sleep apnea (adult) (pediatric): Secondary | ICD-10-CM | POA: Diagnosis present

## 2019-10-24 DIAGNOSIS — N2581 Secondary hyperparathyroidism of renal origin: Secondary | ICD-10-CM | POA: Diagnosis present

## 2019-10-24 DIAGNOSIS — Z79899 Other long term (current) drug therapy: Secondary | ICD-10-CM

## 2019-10-24 DIAGNOSIS — Z888 Allergy status to other drugs, medicaments and biological substances status: Secondary | ICD-10-CM | POA: Diagnosis not present

## 2019-10-24 DIAGNOSIS — G9341 Metabolic encephalopathy: Secondary | ICD-10-CM | POA: Diagnosis present

## 2019-10-24 DIAGNOSIS — Z515 Encounter for palliative care: Secondary | ICD-10-CM | POA: Diagnosis not present

## 2019-10-24 DIAGNOSIS — I120 Hypertensive chronic kidney disease with stage 5 chronic kidney disease or end stage renal disease: Secondary | ICD-10-CM | POA: Diagnosis present

## 2019-10-24 DIAGNOSIS — Z7901 Long term (current) use of anticoagulants: Secondary | ICD-10-CM

## 2019-10-24 DIAGNOSIS — R451 Restlessness and agitation: Secondary | ICD-10-CM | POA: Diagnosis not present

## 2019-10-24 DIAGNOSIS — G9349 Other encephalopathy: Secondary | ICD-10-CM | POA: Diagnosis not present

## 2019-10-24 DIAGNOSIS — Z8249 Family history of ischemic heart disease and other diseases of the circulatory system: Secondary | ICD-10-CM

## 2019-10-24 DIAGNOSIS — R4182 Altered mental status, unspecified: Secondary | ICD-10-CM | POA: Diagnosis present

## 2019-10-24 DIAGNOSIS — I10 Essential (primary) hypertension: Secondary | ICD-10-CM | POA: Diagnosis not present

## 2019-10-24 DIAGNOSIS — N186 End stage renal disease: Secondary | ICD-10-CM

## 2019-10-24 DIAGNOSIS — N19 Unspecified kidney failure: Secondary | ICD-10-CM | POA: Diagnosis not present

## 2019-10-24 DIAGNOSIS — Z803 Family history of malignant neoplasm of breast: Secondary | ICD-10-CM

## 2019-10-24 LAB — COMPREHENSIVE METABOLIC PANEL
ALT: 14 U/L (ref 0–44)
AST: 23 U/L (ref 15–41)
Albumin: 3.3 g/dL — ABNORMAL LOW (ref 3.5–5.0)
Alkaline Phosphatase: 55 U/L (ref 38–126)
Anion gap: 24 — ABNORMAL HIGH (ref 5–15)
BUN: 102 mg/dL — ABNORMAL HIGH (ref 8–23)
CO2: 18 mmol/L — ABNORMAL LOW (ref 22–32)
Calcium: 8.3 mg/dL — ABNORMAL LOW (ref 8.9–10.3)
Chloride: 100 mmol/L (ref 98–111)
Creatinine, Ser: 12.68 mg/dL — ABNORMAL HIGH (ref 0.61–1.24)
GFR, Estimated: 4 mL/min — ABNORMAL LOW (ref >60)
Glucose, Bld: 75 mg/dL (ref 70–99)
Potassium: 4.6 mmol/L (ref 3.5–5.1)
Sodium: 142 mmol/L (ref 135–145)
Total Bilirubin: 0.8 mg/dL (ref 0.3–1.2)
Total Protein: 6.9 g/dL (ref 6.5–8.1)

## 2019-10-24 LAB — RESPIRATORY PANEL BY RT PCR (FLU A&B, COVID)
Influenza A by PCR: NEGATIVE
Influenza B by PCR: NEGATIVE
SARS Coronavirus 2 by RT PCR: NEGATIVE

## 2019-10-24 LAB — CBC WITH DIFFERENTIAL/PLATELET
Abs Immature Granulocytes: 0.05 10*3/uL (ref 0.00–0.07)
Basophils Absolute: 0 10*3/uL (ref 0.0–0.1)
Basophils Relative: 0 %
Eosinophils Absolute: 0.1 10*3/uL (ref 0.0–0.5)
Eosinophils Relative: 1 %
HCT: 29.7 % — ABNORMAL LOW (ref 39.0–52.0)
Hemoglobin: 8.6 g/dL — ABNORMAL LOW (ref 13.0–17.0)
Immature Granulocytes: 1 %
Lymphocytes Relative: 15 %
Lymphs Abs: 1.3 10*3/uL (ref 0.7–4.0)
MCH: 28.1 pg (ref 26.0–34.0)
MCHC: 29 g/dL — ABNORMAL LOW (ref 30.0–36.0)
MCV: 97.1 fL (ref 80.0–100.0)
Monocytes Absolute: 1 10*3/uL (ref 0.1–1.0)
Monocytes Relative: 11 %
Neutro Abs: 6.6 10*3/uL (ref 1.7–7.7)
Neutrophils Relative %: 72 %
Platelets: 286 10*3/uL (ref 150–400)
RBC: 3.06 MIL/uL — ABNORMAL LOW (ref 4.22–5.81)
RDW: 14.3 % (ref 11.5–15.5)
WBC: 9.1 10*3/uL (ref 4.0–10.5)
nRBC: 0 % (ref 0.0–0.2)

## 2019-10-24 LAB — TROPONIN I (HIGH SENSITIVITY)
Troponin I (High Sensitivity): 32 ng/L — ABNORMAL HIGH (ref <18)
Troponin I (High Sensitivity): 27 ng/L — ABNORMAL HIGH (ref <18)

## 2019-10-24 LAB — TSH: TSH: 0.663 u[IU]/mL (ref 0.350–4.500)

## 2019-10-24 LAB — CBG MONITORING, ED: Glucose-Capillary: 70 mg/dL (ref 70–99)

## 2019-10-24 LAB — ETHANOL: Alcohol, Ethyl (B): <10 mg/dL (ref <10)

## 2019-10-24 IMAGING — CT CT HEAD W/O CM
4 of 5 series · 16 of 47 positions shown, 18 images · non-contrast
Comparison: [DATE]

CLINICAL DATA: Mental status change of unknown cause.

EXAM:
CT HEAD WITHOUT CONTRAST
TECHNIQUE: Contiguous axial images were obtained from the base of the skull
through the vertex without intravenous contrast.

[Series 3: head wo · axial · 0.44mm/px · z∈[-140,-30]mm · 5 of 34 slices shown, 7 images (1 of 2)]
[im 6/34  brain]
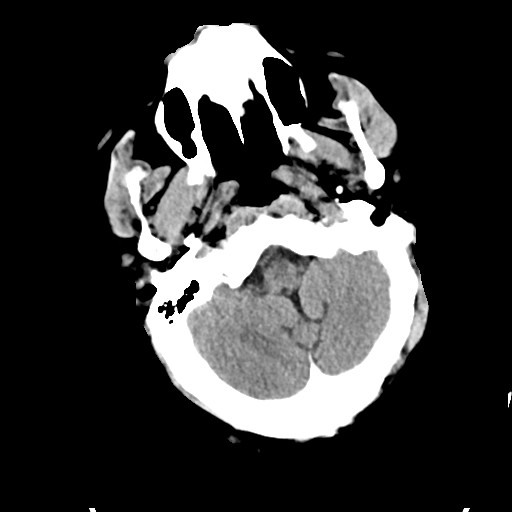
[im 6/34  bone]
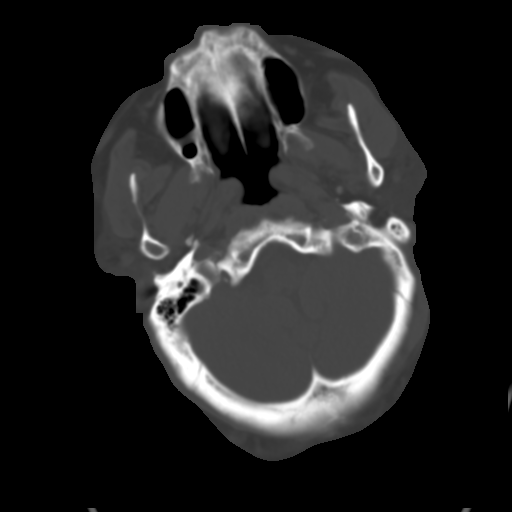
[im 12/34  brain]
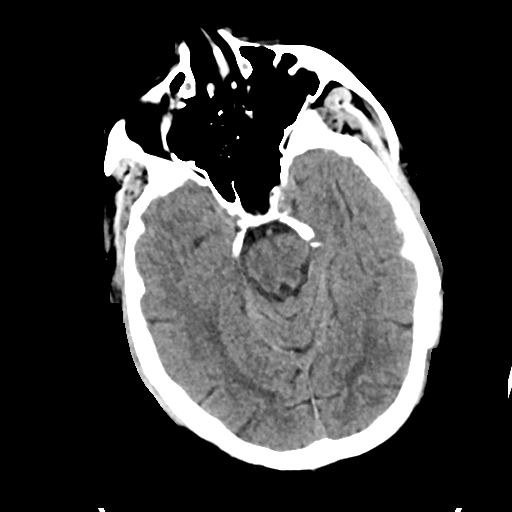
[im 17/34  brain]
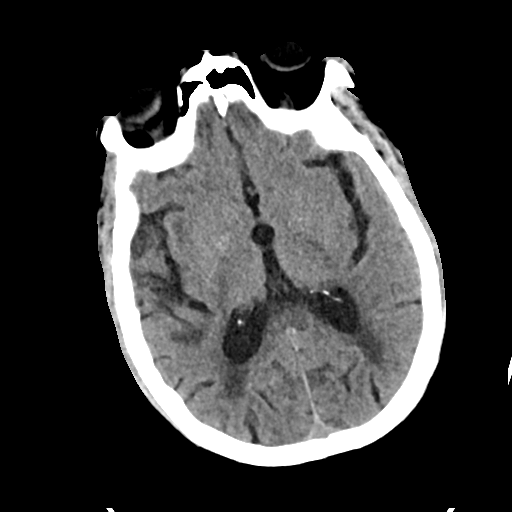
[im 23/34  brain]
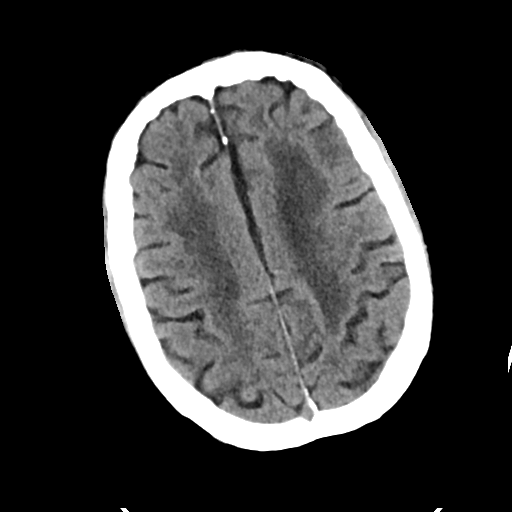
[im 28/34  brain]
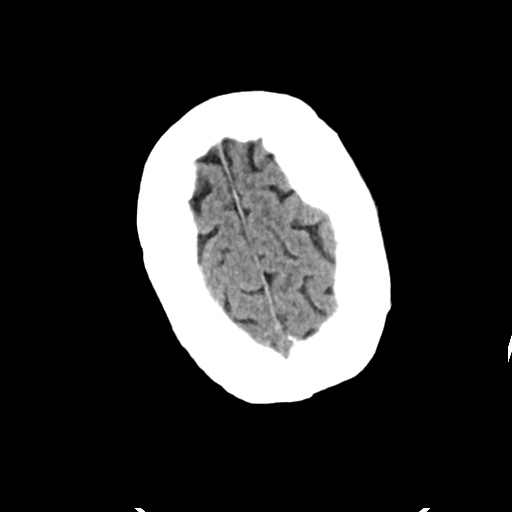
[im 28/34  bone]
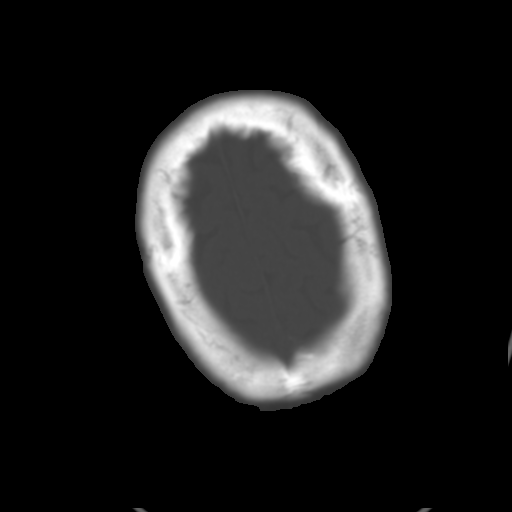

[Series 5: cor soft · coronal · 0.37mm/px · 3 of 74 slices shown]
[im 25/74  brain]
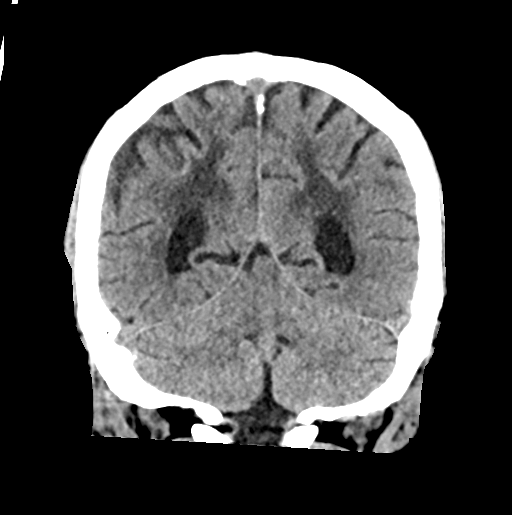
[im 33/74  brain]
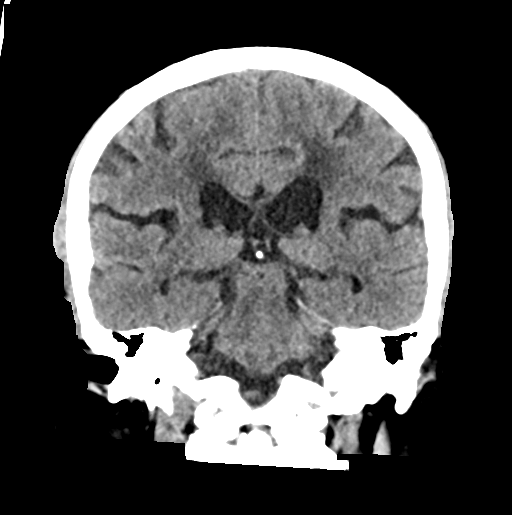
[im 41/74  brain]
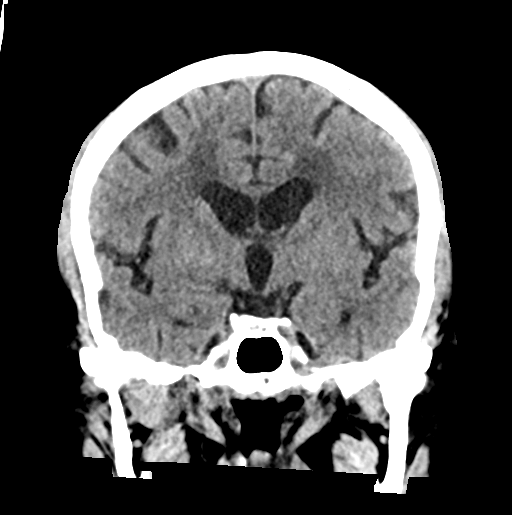

[Series 6: sag soft · sagittal · 0.37mm/px · 3 of 54 slices shown]
[im 18/54  brain]
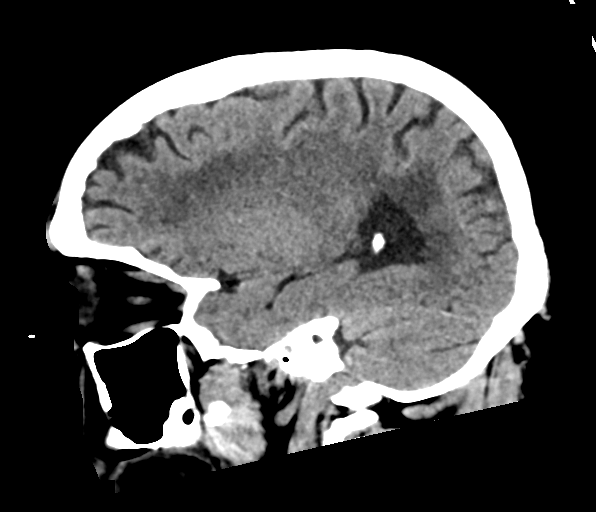
[im 27/54  brain]
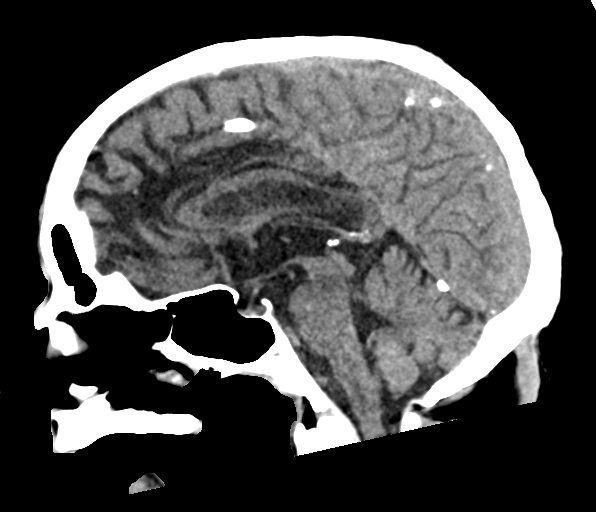
[im 36/54  brain]
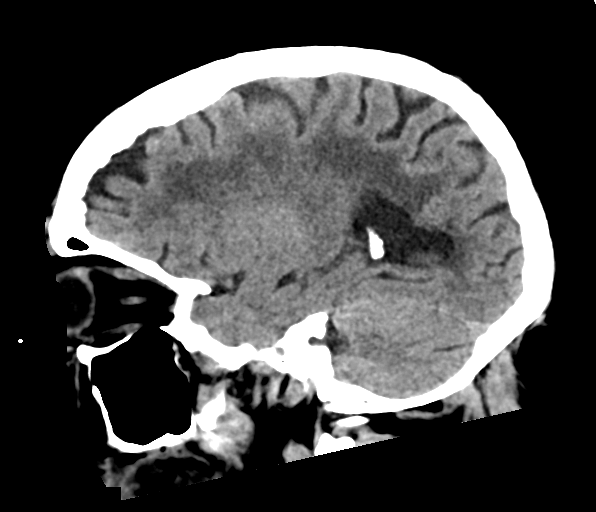

[Series 7: head wo · axial · 0.34mm/px · z∈[-108,+1]mm · 5 of 38 slices shown (2 of 2)]
[im 7/38  brain]
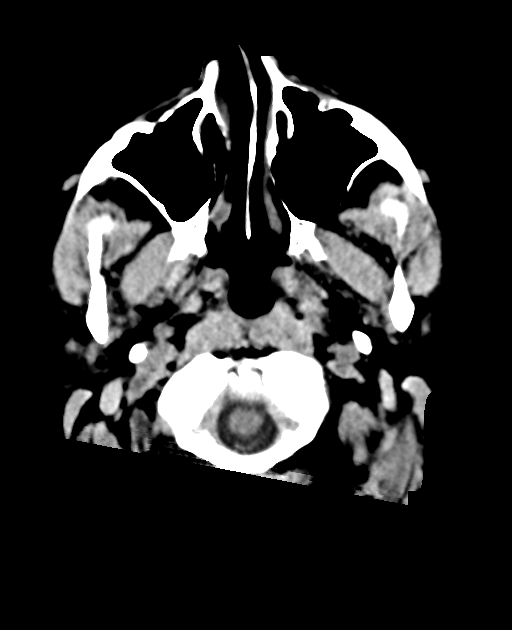
[im 13/38  brain]
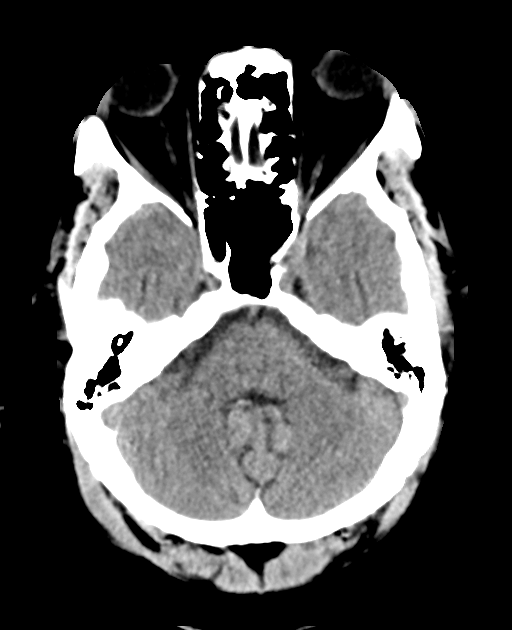
[im 19/38  brain]
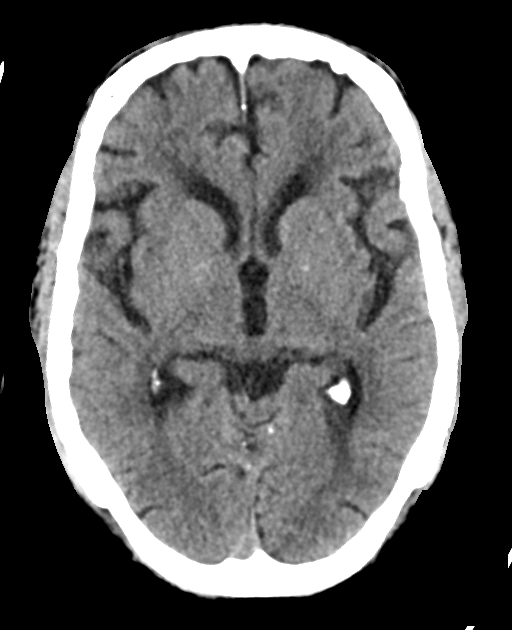
[im 25/38  brain]
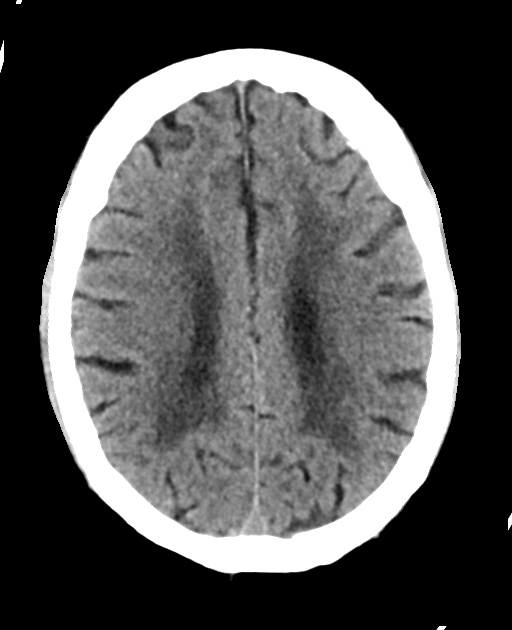
[im 31/38  brain]
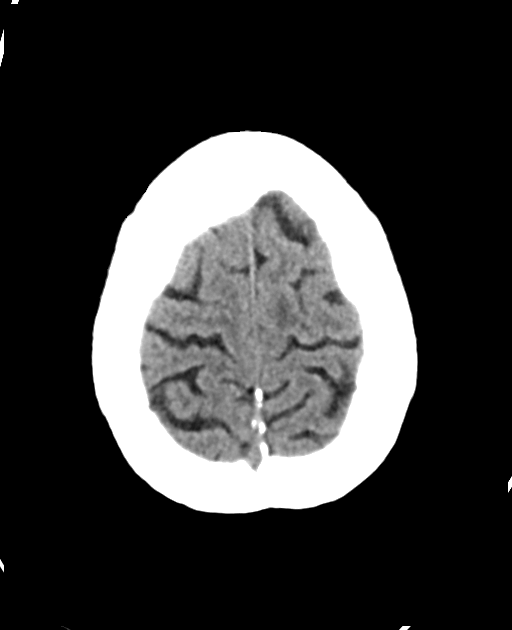

[16 of 47 positions shown; findings below may reference images not displayed]

FINDINGS: Brain: No evidence of acute infarction, hemorrhage, hydrocephalus,
extra-axial collection or mass lesion/mass effect. Confluent chronic
small vessel ischemia in the cerebral white matter. Mild cerebral
volume loss

Vascular: Atherosclerotic calcification.

Skull: Normal. Negative for fracture or focal lesion.

Sinuses/Orbits: Negative
IMPRESSION: 1. No acute finding.
2. Confluent chronic small vessel ischemia in the cerebral white
matter.

## 2019-10-24 IMAGING — DX DG CHEST 1V PORT
1 series · 1 of 1 positions shown · non-contrast
Comparison: [DATE]

CLINICAL DATA: Altered mental status.  Missed dialysis.

EXAM:
PORTABLE CHEST 1 VIEW

[chest ap]
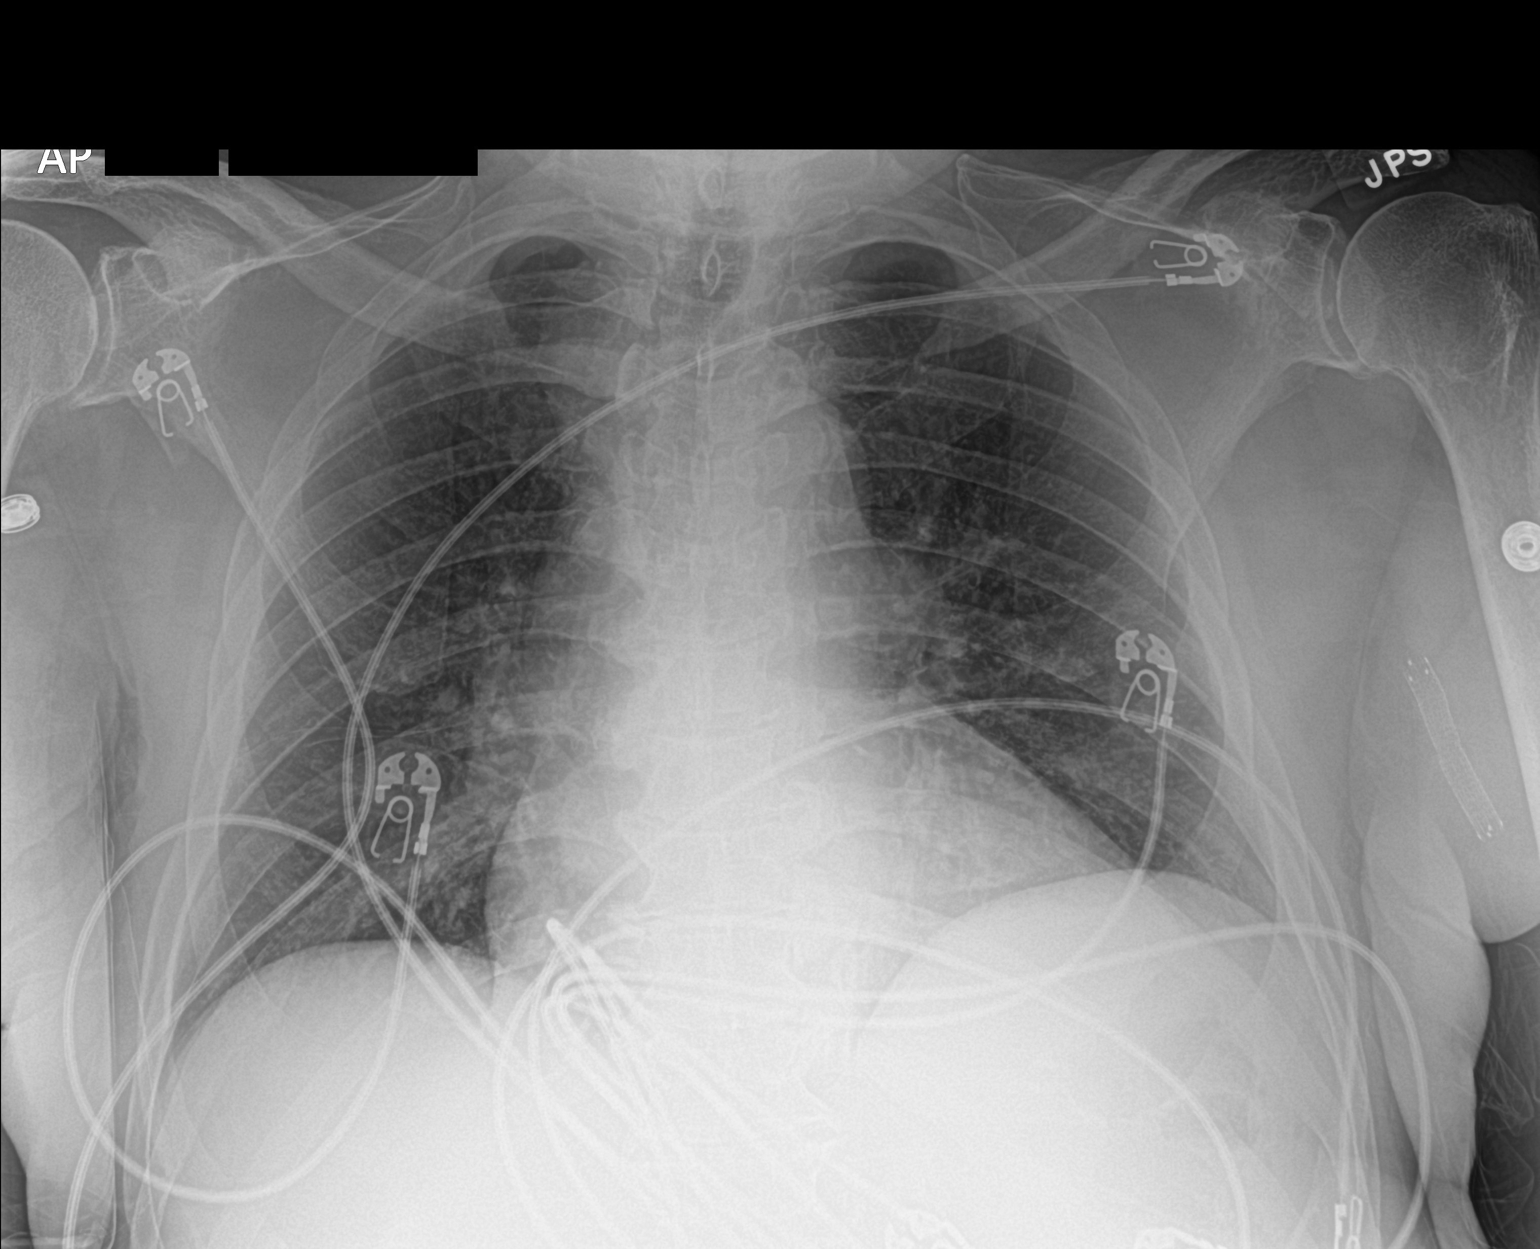

[1 of 1 positions shown; findings below may reference images not displayed]

FINDINGS: Cardiomediastinal silhouette is accentuated by low lung volumes and
AP portable technique. Low lung volumes. No consolidation. No
visible pleural effusions or pneumothorax. The visualized skeletal
structures are unremarkable. Left upper arm vascular stent.
IMPRESSION: No acute cardiopulmonary disease.

## 2019-10-24 MED ORDER — DOXERCALCIFEROL 4 MCG/2ML IV SOLN
3.0000 ug | INTRAVENOUS | Status: DC
  Administered 2019-10-26: 3 ug via INTRAVENOUS
  Filled 2019-10-24 (×3): qty 2

## 2019-10-24 MED ORDER — MELATONIN 3 MG PO TABS
3.0000 mg | ORAL_TABLET | Freq: Every day | ORAL | Status: DC
  Administered 2019-10-25 – 2019-10-31 (×8): 3 mg via ORAL
  Filled 2019-10-24 (×10): qty 1

## 2019-10-24 MED ORDER — SODIUM BICARBONATE 650 MG PO TABS
1300.0000 mg | ORAL_TABLET | Freq: Two times a day (BID) | ORAL | Status: DC
  Administered 2019-10-24 – 2019-10-25 (×2): 1300 mg via ORAL
  Filled 2019-10-24 (×3): qty 2

## 2019-10-24 MED ORDER — CHLORHEXIDINE GLUCONATE CLOTH 2 % EX PADS
6.0000 | MEDICATED_PAD | Freq: Every day | CUTANEOUS | Status: DC
  Administered 2019-10-25: 6 via TOPICAL

## 2019-10-24 MED ORDER — NEBIVOLOL HCL 10 MG PO TABS
10.0000 mg | ORAL_TABLET | Freq: Every day | ORAL | Status: DC
  Administered 2019-10-25 – 2019-10-31 (×7): 10 mg via ORAL
  Filled 2019-10-24 (×9): qty 1

## 2019-10-24 MED ORDER — CLONIDINE HCL 0.1 MG PO TABS: 0.1000 mg | ORAL_TABLET | Freq: Three times a day (TID) | ORAL | Status: DC

## 2019-10-24 MED ORDER — TAMSULOSIN HCL 0.4 MG PO CAPS
0.4000 mg | ORAL_CAPSULE | Freq: Every day | ORAL | Status: DC
  Administered 2019-10-25 – 2019-10-31 (×8): 0.4 mg via ORAL
  Filled 2019-10-24 (×8): qty 1

## 2019-10-24 MED ORDER — ONDANSETRON HCL 4 MG/2ML IJ SOLN: 4.0000 mg | Freq: Four times a day (QID) | INTRAMUSCULAR | Status: DC | PRN

## 2019-10-24 MED ORDER — VITAMIN D (ERGOCALCIFEROL) 1.25 MG (50000 UNIT) PO CAPS
50000.0000 [IU] | ORAL_CAPSULE | ORAL | Status: DC
  Administered 2019-10-25 – 2019-11-01 (×2): 50000 [IU] via ORAL
  Filled 2019-10-24 (×2): qty 1

## 2019-10-24 MED ORDER — HEPARIN SODIUM (PORCINE) 1000 UNIT/ML DIALYSIS
2000.0000 [IU] | Freq: Once | INTRAMUSCULAR | Status: AC
  Administered 2019-10-26: 2000 [IU] via INTRAVENOUS_CENTRAL

## 2019-10-24 MED ORDER — ACETAMINOPHEN 325 MG PO TABS
650.0000 mg | ORAL_TABLET | Freq: Once | ORAL | Status: AC
  Administered 2019-10-24: 650 mg via ORAL
  Filled 2019-10-24: qty 2

## 2019-10-24 MED ORDER — HYDROCERIN EX CREA
TOPICAL_CREAM | Freq: Every day | CUTANEOUS | Status: DC
Start: 2019-10-24 — End: 2019-11-01
  Administered 2019-10-25: 1 via TOPICAL
  Filled 2019-10-24: qty 113

## 2019-10-24 MED ORDER — AMLODIPINE BESYLATE 5 MG PO TABS
5.0000 mg | ORAL_TABLET | Freq: Every day | ORAL | Status: DC
  Administered 2019-10-25 – 2019-11-01 (×8): 5 mg via ORAL
  Filled 2019-10-24 (×8): qty 1

## 2019-10-24 MED ORDER — ACETAMINOPHEN 650 MG RE SUPP: 650.0000 mg | Freq: Four times a day (QID) | RECTAL | Status: DC | PRN

## 2019-10-24 MED ORDER — ALLOPURINOL 100 MG PO TABS
100.0000 mg | ORAL_TABLET | Freq: Every day | ORAL | Status: DC
  Administered 2019-10-24 – 2019-11-01 (×9): 100 mg via ORAL
  Filled 2019-10-24 (×9): qty 1

## 2019-10-24 MED ORDER — CALCITRIOL 0.25 MCG PO CAPS
0.2500 ug | ORAL_CAPSULE | Freq: Every day | ORAL | Status: DC
  Administered 2019-10-24 – 2019-10-25 (×2): 0.25 ug via ORAL
  Filled 2019-10-24 (×2): qty 1

## 2019-10-24 MED ORDER — SODIUM CHLORIDE 0.9 % IV SOLN
62.5000 mg | INTRAVENOUS | Status: AC
  Administered 2019-10-26 (×2): 62.5 mg via INTRAVENOUS
  Filled 2019-10-24 (×2): qty 5

## 2019-10-24 MED ORDER — FERRIC CITRATE 1 GM 210 MG(FE) PO TABS
210.0000 mg | ORAL_TABLET | Freq: Three times a day (TID) | ORAL | Status: DC
Start: 2019-10-24 — End: 2019-11-01
  Administered 2019-10-25 – 2019-11-01 (×19): 210 mg via ORAL
  Filled 2019-10-24 (×21): qty 1

## 2019-10-24 MED ORDER — HYDRALAZINE HCL 25 MG PO TABS
25.0000 mg | ORAL_TABLET | Freq: Three times a day (TID) | ORAL | Status: DC
  Administered 2019-10-25 – 2019-11-01 (×22): 25 mg via ORAL
  Filled 2019-10-24 (×22): qty 1

## 2019-10-24 MED ORDER — ONDANSETRON HCL 4 MG PO TABS: 4.0000 mg | ORAL_TABLET | Freq: Four times a day (QID) | ORAL | Status: DC | PRN

## 2019-10-24 MED ORDER — HYDROMORPHONE HCL 1 MG/ML IJ SOLN
0.5000 mg | Freq: Once | INTRAMUSCULAR | Status: AC
  Administered 2019-10-24: 0.5 mg via INTRAVENOUS
  Filled 2019-10-24: qty 1

## 2019-10-24 MED ORDER — APIXABAN 5 MG PO TABS
5.0000 mg | ORAL_TABLET | Freq: Two times a day (BID) | ORAL | Status: DC
  Administered 2019-10-24 – 2019-11-01 (×16): 5 mg via ORAL
  Filled 2019-10-24 (×15): qty 1

## 2019-10-24 MED ORDER — ACETAMINOPHEN 325 MG PO TABS
650.0000 mg | ORAL_TABLET | Freq: Four times a day (QID) | ORAL | Status: DC | PRN
  Administered 2019-10-27 – 2019-10-28 (×2): 650 mg via ORAL
  Filled 2019-10-24 (×2): qty 2

## 2019-10-24 NOTE — Consult Note (Signed)
Renal Service Consult Note [facility]  Duane Ortiz 10/24/2019 Sol Blazing, MD Requesting Physician:  Dr Roosevelt Locks  Reason for Consult:  ESRD pt missed HD, here w/ AMS HPI: The patient is a 69 y.o. year-old w/ hx of DM2, gout, HL, HTN, PE, OSA and ESRD on HD presented to ED w/ c/o AMS.  Pt brought in by son. Pt missed HD x 2. AMS x 2 days. In ED CXR and CT head show no acute findings. B/Cr up at 102 and 12.6, K 4.6, CO2 18, wbc 9K, Hb 8.6.  HR 69, BP 140/68, afeb. Asked to see for dialysis.   Per our records pt just started HD on 09/26/19.  Pt is poor historian, upset and gets agitated when questioned. Did allow exam. No CP or SOB or N/V.   ROS  denies CP  no joint pain   no HA  no blurry vision  no rash  no diarrhea  no nausea/ vomiting  Past Medical History  Past Medical History:  Diagnosis Date  . Arthritis   . Chronic kidney disease   . Chronic kidney disease   . Diabetes ([place]) 02/012017  . Fainting   . Gait abnormality 10/11/2016  . GERD (gastroesophageal reflux disease)   . Gout   . Headache   . Hypercholesteremia   . Hypercholesterolemia   . Hypertension   . Peripheral neuropathy 04/02/2019  . Pulmonary embolism ([place])   . Shortness of breath   . Sleep apnea    does wear cpap  . Wears dentures    Past Surgical History  Past Surgical History:  Procedure Laterality Date  . A/V SHUNTOGRAM N/A 09/16/2019   Procedure: A/V SHUNTOGRAM - Left Arm;  Surgeon: Waynetta Sandy, MD;  Location: [facility];  Service: Cardiovascular;  Laterality: N/A;  . AV FISTULA PLACEMENT Left 08/15/2019   Procedure: LEFT ARM ARTERIOVENOUS GORE-TEX GRAFT;  Surgeon: Serafina Mitchell, MD;  Location: [facility] OR;  Service: Vascular;  Laterality: Left;  . CERVICAL DISCECTOMY    . COLONOSCOPY    . MULTIPLE TOOTH EXTRACTIONS    . neck spine disk surgery  01/03/1993  . PERIPHERAL VASCULAR INTERVENTION Left 09/16/2019   Procedure: PERIPHERAL VASCULAR INTERVENTION;   Surgeon: Waynetta Sandy, MD;  Location: [facility];  Service: Cardiovascular;  Laterality: Left;  ARM FISTULS  . WRIST SURGERY     Family History  Family History  Problem Relation Age of Onset  . Diabetes Mother   . Heart disease Mother   . Hypertension Mother   . Diabetes Sister   . Hypertension Sister   . Diabetes Brother   . Hypertension Brother   . Breast cancer Paternal Grandmother    Social History  reports that he has been smoking cigarettes. He has been smoking about 0.25 packs per day. He has never used smokeless tobacco. He reports previous alcohol use. He reports that he does not use drugs. Allergies  Allergies  Allergen Reactions  . Baclofen Other (See Comments)    confusion  . Gabapentin     Felt like I was out of my head, slept all day, confusion   . Sulfa Antibiotics Itching   Home medications Prior to Admission medications   Medication Sig Start Date End Date Taking? Authorizing Provider  allopurinol (ZYLOPRIM) 100 MG tablet Take 100 mg by mouth daily.   Yes [provider]  amLODipine (NORVASC) 5 MG tablet Take 1 tablet (5 mg total) by mouth daily.  09/25/19  Yes Annita Brod, MD  apixaban (ELIQUIS) 5 MG TABS tablet Take 1 tablet (5 mg total) by mouth 2 (two) times daily. 09/24/19  Yes Annita Brod, MD  calcitRIOL (ROCALTROL) 0.25 MCG capsule Take 0.25 mcg by mouth daily.  01/31/19  Yes [provider]  cloNIDine (CATAPRES) 0.1 MG tablet Take 1 tablet (0.1 mg total) by mouth 3 (three) times daily. 09/24/19  Yes Annita Brod, MD  ergocalciferol (VITAMIN D2) 1.25 MG (50000 UT) capsule Take 50,000 Units by mouth once a week.  08/02/18  Yes [provider]  ferric citrate (AURYXIA) 1 GM 210 MG(Fe) tablet Take 210 mg by mouth 3 (three) times daily with meals.    Yes [provider]  hydrALAZINE (APRESOLINE) 25 MG tablet Take 1 tablet (25 mg total) by mouth 3 (three) times daily. 09/24/19  Yes Annita Brod, MD  nebivolol (BYSTOLIC) 10 MG tablet Take 10 mg by mouth at bedtime.    Yes [provider]  sodium bicarbonate 650 MG tablet Take 650 mg by mouth 2 (two) times daily.    Yes [provider]  tamsulosin (FLOMAX) 0.4 MG CAPS capsule Take 0.4 mg by mouth at bedtime.    Yes [provider]  oxyCODONE-acetaminophen (PERCOCET) 5-325 MG tablet Take 1 tablet by mouth every 4 (four) hours as needed for severe pain. Patient not taking: Reported on 10/24/2019 08/15/19 08/14/20  Serafina Mitchell, MD     Vitals:   10/24/19 3790 10/24/19 1222  BP:  140/67  Pulse: 78 79  Resp: 17 (!) 24  Temp: 98.3 F (36.8 C)   TempSrc: Oral   SpO2: 100% 98%   Exam Gen alert, agitated, not combative, allowed exam No rash, cyanosis or gangrene Sclera anicteric, throat clear  +JVD Chest clear bilat to bases, no wheezing or rales RRR no MRG Abd soft ntnd no mass or ascites +bs GU normal male  MS no joint effusions or deformity Ext bilat mild-mod pretib edema, no wounds or ulcers Neuro is alert, Ox 3 , nf LUA AVG +bruit   Home meds:  - norvasc / catapres / hydralazine/ nebivolol  - sod bicarb/ rocaltrol 0.25 qd/ auryxia 210 ac tid  - eliquis 5 bid  - flomax 0.4/ zyloprim 100 qd/ percocet prn  - prn's/ vitamins/ supplements  CXR - no acute dz   OP HD: TTS [facility]   4h  85.5kg  2/2.5 bath  Hep2000  AVG  - hect 3 tiw  - venofer 50 qwk  - mircera 150 q 2, last 10/14   Assessment/ Plan: 1. AMS - possibly uremic, last HD was 5 days ago. Plan HD today.  2. ESRD - recent start to HD, plan HD today, missed HD x 2. AVG. 3. HTN - BP's stable, cont home meds 4. MBD ckd - cont binder, vdra 5. Anemia ckd - Hb 8.6, next esa due on 10/28, cont weekly IV fe      Kelly Splinter  MD 10/24/2019, 2:35 PM  Recent Labs  Lab 10/24/19 1107  WBC 9.1  HGB 8.6*   Recent Labs  Lab 10/24/19 1107  K 4.6  BUN 102*  CREATININE 12.68*  CALCIUM 8.3*

## 2019-10-24 NOTE — ED Notes (Addendum)
Pt comp[laing of mid-thoracic back pain, states that it feels like a cramp.  Pt saying ow repeatedly.  Pain appeared to resolve for a moment and then came back.

## 2019-10-24 NOTE — Consult Note (Signed)
WOC Nurse Consult Note: Patient receiving care in [facility] ED15 Reason for Consult: Foot wounds Feet are both dry with thickened toenails. Dry blood on toes with appearance of being scraped on a hard surface. Small scab on left little toe. Patient denies any pain and doesn't remember how he hit his toes. Surrounding skin is very dry.  Dressing procedure/placement/frequency: Wash both feet with soap and water. Apply small piece of Xeroform gauze Kellie Simmering # 294) to affected toes and wrap with small Kerlix by weaving in and out of toes. Change daily.  Apply Eucerin cream to remaining areas of the feet and legs. Apply daily.  Monitor the wound area(s) for worsening of condition such as: Signs/symptoms of infection, increase in size, development of or worsening of odor, development of pain, or increased pain at the affected locations.   Notify the medical team if any of these develop.  Thank you for the consult. [place] nurse will not follow at this time.   Please re-consult the [place] team if needed.  Cathlean Marseilles Tamala Julian, MSN, RN, [place], Lysle Pearl, [facility] Wound Treatment Associate Pager [phone]

## 2019-10-24 NOTE — H&P (Signed)
History and Physical    Duane Ortiz PRF:[id number] DOB: 08-Feb-1950 DOA: 10/24/2019  PCP: Duane Mariscal, MD (Confirm with patient/family/NH records and if not entered, this has to be entered at [facility] point of entry) Patient coming from: Home  I have personally briefly reviewed patient's old medical records in [place]  Chief Complaint: AMS  HPI: Duane Ortiz is a 69 y.o. male with medical history significant of ESRD on HD, HTN, PE on Eliquis, presented with altered mental status/PCR behavior.  Patient was recently started on hemodialysis due to prolonged history of HTN nephropathy on 09/21/2019.  Currently, patient more confused, currently provide partial history and the rest of the history pleaded in by his son Duane Ortiz over the phone.  Patient lives by himself with both sons stop by frequently in the evenings.  Duane Ortiz reported the patient last dialysis was last Saturday.  This Tuesday, he said that patient missed the morning shuttle to dialysis center.  And patient was seen wandering around the neighborhood yesterday afternoon very confused.  This morning patient's went to his house and found the patient was outside his house without any issues and very confused.  Patient claimed that he has had some trouble sleeping at night, can only manage to sleep 2 to 3 hours at night for last few days.  He denies any fever chills, he still makes good amount urine and denied any dysuria.  Patient further reported that he has been having left index finger and middle finger pain during dialysis.  Patient denied feeling of depressed. ED Course: Very confused, no tachycardia hypertension, no hypoxia.  BUN 1 2, bicarb 18, potassium 4.6, creatinine 12, glucose 75.  Review of Systems: As per HPI otherwise 14 point review of systems negative.    Past Medical History:  Diagnosis Date  . Arthritis   . Chronic kidney disease   . Chronic kidney disease   . Diabetes ([employer]) 02/012017  . Fainting   . Gait abnormality  10/11/2016  . GERD (gastroesophageal reflux disease)   . Gout   . Headache   . Hypercholesteremia   . Hypercholesterolemia   . Hypertension   . Peripheral neuropathy 04/02/2019  . Pulmonary embolism ([place])   . Shortness of breath   . Sleep apnea    does wear cpap  . Wears dentures     Past Surgical History:  Procedure Laterality Date  . A/V SHUNTOGRAM N/A 09/16/2019   Procedure: A/V SHUNTOGRAM - Left Arm;  Surgeon: Waynetta Sandy, MD;  Location: [place] [facility];  Service: Cardiovascular;  Laterality: N/A;  . AV FISTULA PLACEMENT Left 08/15/2019   Procedure: LEFT ARM ARTERIOVENOUS GORE-TEX GRAFT;  Surgeon: Serafina Mitchell, MD;  Location: [facility];  Service: Vascular;  Laterality: Left;  . CERVICAL DISCECTOMY    . COLONOSCOPY    . MULTIPLE TOOTH EXTRACTIONS    . neck spine disk surgery  01/03/1993  . PERIPHERAL VASCULAR INTERVENTION Left 09/16/2019   Procedure: PERIPHERAL VASCULAR INTERVENTION;  Surgeon: Waynetta Sandy, MD;  Location: [place] [facility];  Service: Cardiovascular;  Laterality: Left;  ARM FISTULS  . WRIST SURGERY       reports that he has been smoking cigarettes. He has been smoking about 0.25 packs per day. He has never used smokeless tobacco. He reports previous alcohol use. He reports that he does not use drugs.  Allergies  Allergen Reactions  . Baclofen Other (See Comments)    confusion  . Gabapentin     Felt  like I was out of my head, slept all day, confusion   . Sulfa Antibiotics Itching    Family History  Problem Relation Age of Onset  . Diabetes Mother   . Heart disease Mother   . Hypertension Mother   . Diabetes Sister   . Hypertension Sister   . Diabetes Brother   . Hypertension Brother   . Breast cancer Paternal Grandmother      Prior to Admission medications   Medication Sig Start Date End Date Taking? Authorizing Provider  allopurinol (ZYLOPRIM) 100 MG tablet Take 100 mg by mouth daily.   Yes [provider]   amLODipine (NORVASC) 5 MG tablet Take 1 tablet (5 mg total) by mouth daily. 09/25/19  Yes Annita Brod, MD  apixaban (ELIQUIS) 5 MG TABS tablet Take 1 tablet (5 mg total) by mouth 2 (two) times daily. 09/24/19  Yes Annita Brod, MD  calcitRIOL (ROCALTROL) 0.25 MCG capsule Take 0.25 mcg by mouth daily.  01/31/19  Yes [provider]  cloNIDine (CATAPRES) 0.1 MG tablet Take 1 tablet (0.1 mg total) by mouth 3 (three) times daily. 09/24/19  Yes Annita Brod, MD  ergocalciferol (VITAMIN D2) 1.25 MG (50000 UT) capsule Take 50,000 Units by mouth once a week.  08/02/18  Yes [provider]  ferric citrate (AURYXIA) 1 GM 210 MG(Fe) tablet Take 210 mg by mouth 3 (three) times daily with meals.    Yes [provider]  hydrALAZINE (APRESOLINE) 25 MG tablet Take 1 tablet (25 mg total) by mouth 3 (three) times daily. 09/24/19  Yes Annita Brod, MD  nebivolol (BYSTOLIC) 10 MG tablet Take 10 mg by mouth at bedtime.    Yes [provider]  sodium bicarbonate 650 MG tablet Take 650 mg by mouth 2 (two) times daily.    Yes [provider]  tamsulosin (FLOMAX) 0.4 MG CAPS capsule Take 0.4 mg by mouth at bedtime.    Yes [provider]  oxyCODONE-acetaminophen (PERCOCET) 5-325 MG tablet Take 1 tablet by mouth every 4 (four) hours as needed for severe pain. Patient not taking: Reported on 10/24/2019 08/15/19 08/14/20  Serafina Mitchell, MD    Physical Exam: Vitals:   10/24/19 0939 10/24/19 1222  BP:  140/67  Pulse: 78 79  Resp: 17 (!) 24  Temp: 98.3 F (36.8 C)   TempSrc: Oral   SpO2: 100% 98%    Constitutional: NAD, calm, comfortable Vitals:   10/24/19 0939 10/24/19 1222  BP:  140/67  Pulse: 78 79  Resp: 17 (!) 24  Temp: 98.3 F (36.8 C)   TempSrc: Oral   SpO2: 100% 98%   Eyes: PERRL, lids and conjunctivae normal ENMT: Mucous membranes are moist. Posterior pharynx clear of any exudate or lesions.Normal dentition.  Neck: normal,  supple, no masses, no thyromegaly Respiratory: clear to auscultation bilaterally, no wheezing, no crackles. Normal respiratory effort. No accessory muscle use.  Cardiovascular: Regular rate and rhythm, no murmurs / rubs / gallops. 2+ extremity edema. 2+ pedal pulses. No carotid bruits.  Abdomen: no tenderness, no masses palpated. No hepatosplenomegaly. Bowel sounds positive.  Musculoskeletal: no clubbing / cyanosis. No joint deformity upper and lower extremities. Good ROM, no contractures. Normal muscle tone.  Skin: Multiple skin abrasions and skin cracks on bilateral feet Neurologic: CN 2-12 grossly intact. Sensation intact, DTR normal. Strength 5/5 in all 4.  Psychiatric: Calm, oriented to himself and place confused about time    Labs on Admission: I have personally  reviewed following labs and imaging studies  CBC: Recent Labs  Lab 10/24/19 1107  WBC 9.1  NEUTROABS 6.6  HGB 8.6*  HCT 29.7*  MCV 97.1  PLT 427   Basic Metabolic Panel: Recent Labs  Lab 10/24/19 1107  NA 142  K 4.6  CL 100  CO2 18*  GLUCOSE 75  BUN 102*  CREATININE 12.68*  CALCIUM 8.3*   GFR: CrCl cannot be calculated (Unknown ideal weight.). Liver Function Tests: Recent Labs  Lab 10/24/19 1107  AST 23  ALT 14  ALKPHOS 55  BILITOT 0.8  PROT 6.9  ALBUMIN 3.3*   No results for input(s): LIPASE, AMYLASE in the last 168 hours. No results for input(s): AMMONIA in the last 168 hours. Coagulation Profile: No results for input(s): INR, PROTIME in the last 168 hours. Cardiac Enzymes: No results for input(s): CKTOTAL, CKMB, CKMBINDEX, TROPONINI in the last 168 hours. BNP (last 3 results) No results for input(s): PROBNP in the last 8760 hours. HbA1C: No results for input(s): HGBA1C in the last 72 hours. CBG: Recent Labs  Lab 10/24/19 1012  GLUCAP 70   Lipid Profile: No results for input(s): CHOL, HDL, LDLCALC, TRIG, CHOLHDL, LDLDIRECT in the last 72 hours. Thyroid Function Tests: No results for  input(s): TSH, T4TOTAL, FREET4, T3FREE, THYROIDAB in the last 72 hours. Anemia Panel: No results for input(s): VITAMINB12, FOLATE, FERRITIN, TIBC, IRON, RETICCTPCT in the last 72 hours. Urine analysis:    Component Value Date/Time   COLORURINE STRAW (A) 05/28/2018 1010   APPEARANCEUR CLEAR 05/28/2018 1010   LABSPEC 1.010 05/28/2018 1010   PHURINE 6.0 05/28/2018 1010   GLUCOSEU NEGATIVE 05/28/2018 1010   HGBUR NEGATIVE 05/28/2018 1010   BILIRUBINUR NEGATIVE 05/28/2018 [place] 05/28/2018 1010   PROTEINUR 100 (A) 05/28/2018 1010   NITRITE NEGATIVE 05/28/2018 1010   LEUKOCYTESUR NEGATIVE 05/28/2018 1010    Radiological Exams on Admission: CT HEAD WO CONTRAST  Result Date: 10/24/2019 CLINICAL DATA:  Mental status change of unknown cause. EXAM: CT HEAD WITHOUT CONTRAST TECHNIQUE: Contiguous axial images were obtained from the base of the skull through the vertex without intravenous contrast. COMPARISON:  09/30/2019 FINDINGS: Brain: No evidence of acute infarction, hemorrhage, hydrocephalus, extra-axial collection or mass lesion/mass effect. Confluent chronic small vessel ischemia in the cerebral white matter. Mild cerebral volume loss Vascular: Atherosclerotic calcification. Skull: Normal. Negative for fracture or focal lesion. Sinuses/Orbits: Negative IMPRESSION: 1. No acute finding. 2. Confluent chronic small vessel ischemia in the cerebral white matter. Electronically Signed   By: Monte Fantasia M.D.   On: 10/24/2019 10:40   DG Chest Port 1 View  Result Date: 10/24/2019 CLINICAL DATA:  Altered mental status.  Missed dialysis. EXAM: PORTABLE CHEST 1 VIEW COMPARISON:  09/30/2019 FINDINGS: Cardiomediastinal silhouette is accentuated by low lung volumes and AP portable technique. Low lung volumes. No consolidation. No visible pleural effusions or pneumothorax. The visualized skeletal structures are unremarkable. Left upper arm vascular stent. IMPRESSION: No acute  cardiopulmonary disease. Electronically Signed   By: Margaretha Sheffield MD   On: 10/24/2019 13:09    EKG: Independently reviewed.  Poor R wave progression  Assessment/Plan Active Problems:   AMS (altered mental status)  (please populate well all problems here in Problem List. (For example, if patient is on BP meds at home and you resume or decide to hold them, it is a problem that needs to be her. Same for CAD, COPD, HLD and so on)  Acute metabolic encephalopathy -Acute part likely attributed to  worsening of uremia, discussed with on-call nephrologist, who will arrange emergent dialysis today.  -There are at least several possibilities attributed to patient chronic mentation deterioration: Significant sleep disturbance over the last few days although can be attributed to uremia, but the son also reported patient has had bizarre behavior even before dialysis was started.  So I told the son regarding take patient to evaluated for dementia as outpatient. -Sent TSH and RPR, B12 were checked on last admission. -UDS and UA pending  ESRD on HD -Patient claimed that he has had some acute pain every time he has dialysis, however this cannot be confirmed with patient's son or nephrology, hopefully outpatient HD center has more record.  Acute on chronic uremia and azotemia -Emergency HD  Non-anion gap metabolic acidosis acute on chronic -HD, continue p.o. bicarb  HTN -Continue home meds  PE -Continue Eliquis  DVT prophylaxis: Eliquis  code Status: Full Code Family Communication: Son Duane Ortiz Disposition Plan: Need to reevaluate patient mentation after dialysis, expect more than 2 midnight hospital stay to treat uremia acidosis.  PT evaluation Consults called: Nephrology Admission status: Telemetry admission   Lequita Halt MD [facility] Pager [phone]  10/24/2019, 1:51 PM

## 2019-10-24 NOTE — ED Provider Notes (Signed)
[facility] EMERGENCY DEPARTMENT Provider Note   CSN: [id number] Arrival date & time: 10/24/19  4562     History Chief Complaint  Patient presents with  . Altered Mental Status    Duane Ortiz is a 69 y.o. male.  Per son, patient has not gone to dialysis since last Saturday.  He is a Tuesday, Thursday, Saturday dialysis patient.  The patient denies any complaints, but the son reports that his father has not been acting like himself for several days.  He feels his whole personality has changed, he has started wandering around town.  During his last admission, patient had altered mental status for baclofen toxicity due to not having renally dosed regimen, but his altered mental status today seems somewhat different.  The history is provided by the patient and a relative. The history is limited by the condition of the patient.  Altered Mental Status Presenting symptoms: behavior changes and confusion   Severity:  Moderate Most recent episode:  2 days ago Episode history:  Continuous Timing:  Constant Progression:  Worsening Chronicity:  Recurrent Context: recent change in medication   Context comment:  Noncompliance with HD      Past Medical History:  Diagnosis Date  . Arthritis   . Chronic kidney disease   . Chronic kidney disease   . Diabetes ([place]) 02/012017  . Fainting   . Gait abnormality 10/11/2016  . GERD (gastroesophageal reflux disease)   . Gout   . Headache   . Hypercholesteremia   . Hypercholesterolemia   . Hypertension   . Peripheral neuropathy 04/02/2019  . Pulmonary embolism ([place])   . Shortness of breath   . Sleep apnea    does wear cpap  . Wears dentures     Patient Active Problem List   Diagnosis Date Noted  . AMS (altered mental status) 10/01/2019  . Acute encephalopathy 09/30/2019  . ESRD (end stage renal disease) ([place]) 09/21/2019  . Hyperkalemia, diminished renal excretion 09/21/2019  . Anemia 08/28/2019  . Disorder of  mineral metabolism, unspecified 08/28/2019  . Hyperkalemia 08/28/2019  . Hypomagnesemia 08/28/2019  . Metabolic acidosis 56/38/9373  . Mycosis 08/28/2019  . Tinea pedis 08/28/2019  . Peripheral neuropathy 04/02/2019  . Coagulation defect ([place]) 07/12/2018  . History of pulmonary embolism 07/12/2018  . Insomnia 07/12/2018  . Lumbar disc disease with radiculopathy 07/12/2018  . GERD (gastroesophageal reflux disease) 04/24/2018  . Hypertensive kidney disease with stage 4 chronic kidney disease ([place]) 03/19/2018  . Allergic rhinitis 03/01/2018  . Unstable gait 03/01/2018  . Chronic lower back pain 02/13/2018  . Gout involving toe of left foot 02/13/2018  . History of alcohol abuse 02/13/2018  . Overweight (BMI 25.0-29.9) 02/13/2018  . Vitamin D deficiency 02/13/2018  . Essential hypertension 04/27/2017  . Type 2 diabetes mellitus with renal complication ([place]) 42/87/6811  . Gait abnormality 10/11/2016  . Diabetic peripheral neuropathy ([place]) 08/25/2016  . Disc degeneration, lumbar 08/25/2016  . 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 Dyspnea on exertion 05/22/2015  . Hypercholesteremia   . Gout   . Pulmonary embolism ([place])   . Chronic kidney disease   . Hypercholesterolemia     Past Surgical History:  Procedure Laterality Date  . A/V SHUNTOGRAM N/A 09/16/2019   Procedure: A/V SHUNTOGRAM - Left Arm;  Surgeon: Waynetta Sandy, MD;  Location: [facility];  Service: Cardiovascular;  Laterality: N/A;  . AV FISTULA PLACEMENT Left 08/15/2019   Procedure: LEFT ARM ARTERIOVENOUS  GORE-TEX GRAFT;  Surgeon: Serafina Mitchell, MD;  Location: [facility] OR;  Service: Vascular;  Laterality: Left;  . CERVICAL DISCECTOMY    . COLONOSCOPY    . MULTIPLE TOOTH EXTRACTIONS    . neck spine disk surgery  01/03/1993  . PERIPHERAL VASCULAR INTERVENTION Left 09/16/2019   Procedure: PERIPHERAL VASCULAR INTERVENTION;  Surgeon: Waynetta Sandy, MD;   Location: [facility];  Service: Cardiovascular;  Laterality: Left;  ARM FISTULS  . WRIST SURGERY         Family History  Problem Relation Age of Onset  . Diabetes Mother   . Heart disease Mother   . Hypertension Mother   . Diabetes Sister   . Hypertension Sister   . Diabetes Brother   . Hypertension Brother   . Breast cancer Paternal Grandmother     Social History   Tobacco Use  . Smoking status: Current Some Day Smoker    Packs/day: 0.25    Types: Cigarettes  . Smokeless tobacco: Never Used  Vaping Use  . Vaping Use: Never used  Substance Use Topics  . Alcohol use: Not Currently    Alcohol/week: 0.0 standard drinks  . Drug use: No    Home Medications Prior to Admission medications   Medication Sig Start Date End Date Taking? Authorizing Provider  allopurinol (ZYLOPRIM) 100 MG tablet Take 100 mg by mouth daily.   Yes [provider]  amLODipine (NORVASC) 5 MG tablet Take 1 tablet (5 mg total) by mouth daily. 09/25/19  Yes Annita Brod, MD  apixaban (ELIQUIS) 5 MG TABS tablet Take 1 tablet (5 mg total) by mouth 2 (two) times daily. 09/24/19  Yes Annita Brod, MD  calcitRIOL (ROCALTROL) 0.25 MCG capsule Take 0.25 mcg by mouth daily.  01/31/19  Yes [provider]  cloNIDine (CATAPRES) 0.1 MG tablet Take 1 tablet (0.1 mg total) by mouth 3 (three) times daily. 09/24/19  Yes Annita Brod, MD  ergocalciferol (VITAMIN D2) 1.25 MG (50000 UT) capsule Take 50,000 Units by mouth once a week.  08/02/18  Yes [provider]  ferric citrate (AURYXIA) 1 GM 210 MG(Fe) tablet Take 210 mg by mouth 3 (three) times daily with meals.    Yes [provider]  hydrALAZINE (APRESOLINE) 25 MG tablet Take 1 tablet (25 mg total) by mouth 3 (three) times daily. 09/24/19  Yes Annita Brod, MD  nebivolol (BYSTOLIC) 10 MG tablet Take 10 mg by mouth at bedtime.    Yes [provider]  sodium bicarbonate 650 MG tablet Take 650 mg by  mouth 2 (two) times daily.    Yes [provider]  tamsulosin (FLOMAX) 0.4 MG CAPS capsule Take 0.4 mg by mouth at bedtime.    Yes [provider]  oxyCODONE-acetaminophen (PERCOCET) 5-325 MG tablet Take 1 tablet by mouth every 4 (four) hours as needed for severe pain. Patient not taking: Reported on 10/24/2019 08/15/19 08/14/20  Serafina Mitchell, MD    Allergies    Baclofen, Gabapentin, and Sulfa antibiotics  Review of Systems   Review of Systems  Unable to perform ROS: Mental status change  Psychiatric/Behavioral: Positive for confusion.    Physical Exam Updated Vital Signs BP (!) 132/59   Pulse 73   Temp 98.3 F (36.8 C) (Oral)   Resp 20   SpO2 100%   Physical Exam Vitals and nursing note reviewed.  Constitutional:      Appearance: He is well-developed. He is not ill-appearing, toxic-appearing or  diaphoretic.  HENT:     Head: Normocephalic and atraumatic.  Eyes:     Conjunctiva/sclera: Conjunctivae normal.  Cardiovascular:     Rate and Rhythm: Normal rate and regular rhythm.     Heart sounds: No murmur heard.  No gallop.   Pulmonary:     Effort: Pulmonary effort is normal. No respiratory distress.     Breath sounds: Normal breath sounds.  Abdominal:     Palpations: Abdomen is soft.     Tenderness: There is no abdominal tenderness.  Musculoskeletal:     Cervical back: Neck supple.  Skin:    General: Skin is warm and dry.  Neurological:     Mental Status: He is alert. He is disoriented.     Comments: Mental status: alert and oriented to person, place, but not time or situation. Speech: Speech is clear and language is aphasic Fund of knowledge: Intact  Cranial Nerves:  II: Intact to confrontation bilaterally III, IV, VI: EOMI, no nystagmus V: face sensation intact, good masseter strength VII: no facial droop or weakness VIII: gross hearing intact bilaterally IX/XI: palate elevates symmetrically XII: tongue protrudes symmetrically, no  deviation  Strength: 5/5 and symmetric in BUE and BLE. No pronation or drift. Tone: normal tone, no tremors Coordination: Intact finger to nose and heel to shin. Sensation: intact to light touch in all extremities.  Romberg negative.  Gait: Routine gait stable without assistance      ED Results / Procedures / Treatments   Labs (all labs ordered are listed, but only abnormal results are displayed) Labs Reviewed  CBC WITH DIFFERENTIAL/PLATELET - Abnormal; Notable for the following components:      Result Value   RBC 3.06 (*)    Hemoglobin 8.6 (*)    HCT 29.7 (*)    MCHC 29.0 (*)    All other components within normal limits  COMPREHENSIVE METABOLIC PANEL - Abnormal; Notable for the following components:   CO2 18 (*)    BUN 102 (*)    Creatinine, Ser 12.68 (*)    Calcium 8.3 (*)    Albumin 3.3 (*)    GFR, Estimated 4 (*)    Anion gap 24 (*)    All other components within normal limits  TROPONIN I (HIGH SENSITIVITY) - Abnormal; Notable for the following components:   Troponin I (High Sensitivity) 32 (*)    All other components within normal limits  TROPONIN I (HIGH SENSITIVITY) - Abnormal; Notable for the following components:   Troponin I (High Sensitivity) 27 (*)    All other components within normal limits  RESPIRATORY PANEL BY RT PCR (FLU A&B, COVID)  ETHANOL  TSH  URINALYSIS, COMPLETE (UACMP) WITH MICROSCOPIC  RAPID URINE DRUG SCREEN, HOSP PERFORMED  RPR  BASIC METABOLIC PANEL  CBG MONITORING, ED    EKG EKG Interpretation  Date/Time:  Thursday October 24 2019 10:19:44 EDT Ventricular Rate:  66 PR Interval:    QRS Duration: 95 QT Interval:  416 QTC Calculation: 436 R Axis:   19 Text Interpretation: Sinus rhythm Prolonged PR interval Abnormal R-wave progression, early transition Confirmed by Quintella Reichert [phone]) on 10/24/2019 10:21:41 AM   Radiology CT HEAD WO CONTRAST  Result Date: 10/24/2019 CLINICAL DATA:  Mental status change of unknown cause.  EXAM: CT HEAD WITHOUT CONTRAST TECHNIQUE: Contiguous axial images were obtained from the base of the skull through the vertex without intravenous contrast. COMPARISON:  09/30/2019 FINDINGS: Brain: No evidence of acute infarction, hemorrhage, hydrocephalus, extra-axial collection or mass lesion/mass  effect. Confluent chronic small vessel ischemia in the cerebral white matter. Mild cerebral volume loss Vascular: Atherosclerotic calcification. Skull: Normal. Negative for fracture or focal lesion. Sinuses/Orbits: Negative IMPRESSION: 1. No acute finding. 2. Confluent chronic small vessel ischemia in the cerebral white matter. Electronically Signed   By: Monte Fantasia M.D.   On: 10/24/2019 10:40   DG Chest Port 1 View  Result Date: 10/24/2019 CLINICAL DATA:  Altered mental status.  Missed dialysis. EXAM: PORTABLE CHEST 1 VIEW COMPARISON:  09/30/2019 FINDINGS: Cardiomediastinal silhouette is accentuated by low lung volumes and AP portable technique. Low lung volumes. No consolidation. No visible pleural effusions or pneumothorax. The visualized skeletal structures are unremarkable. Left upper arm vascular stent. IMPRESSION: No acute cardiopulmonary disease. Electronically Signed   By: Margaretha Sheffield MD   On: 10/24/2019 13:09    Procedures Procedures (including critical care time)  Medications Ordered in ED Medications  allopurinol (ZYLOPRIM) tablet 100 mg (100 mg Oral Given 10/24/19 1730)  amLODipine (NORVASC) tablet 5 mg (0 mg Oral Hold 10/24/19 1656)  hydrALAZINE (APRESOLINE) tablet 25 mg (0 mg Oral Hold 10/24/19 1734)  nebivolol (BYSTOLIC) tablet 10 mg (has no administration in time range)  calcitRIOL (ROCALTROL) capsule 0.25 mcg (0.25 mcg Oral Given 10/24/19 1730)  ferric citrate (AURYXIA) tablet 210 mg (has no administration in time range)  sodium bicarbonate tablet 1,300 mg (1,300 mg Oral Given 10/24/19 1730)  tamsulosin (FLOMAX) capsule 0.4 mg (has no administration in time range)   apixaban (ELIQUIS) tablet 5 mg (5 mg Oral Given 10/24/19 1730)  Vitamin D (Ergocalciferol) (DRISDOL) capsule 50,000 Units (has no administration in time range)  acetaminophen (TYLENOL) tablet 650 mg (has no administration in time range)    Or  acetaminophen (TYLENOL) suppository 650 mg (has no administration in time range)  ondansetron (ZOFRAN) tablet 4 mg (has no administration in time range)    Or  ondansetron (ZOFRAN) injection 4 mg (has no administration in time range)  hydrocerin (EUCERIN) cream (has no administration in time range)  melatonin tablet 3 mg (has no administration in time range)  Chlorhexidine Gluconate Cloth 2 % PADS 6 each (has no administration in time range)  doxercalciferol (HECTOROL) injection 3 mcg (has no administration in time range)  ferric gluconate (NULECIT) 62.5 mg in sodium chloride 0.9 % 100 mL IVPB (has no administration in time range)  acetaminophen (TYLENOL) tablet 650 mg (650 mg Oral Given 10/24/19 1730)  HYDROmorphone (DILAUDID) injection 0.5 mg (0.5 mg Intravenous Given 10/24/19 1654)    ED Course  I have reviewed the triage vital signs and the nursing notes.  Pertinent labs & imaging results that were available during my care of the patient were reviewed by me and considered in my medical decision making (see chart for details).    MDM Rules/Calculators/A&P                          The patient is a 69yo male, PMH ESRD on HD TTS, DM, HTN who presents to the ED for AMS.  On my initial evaluation, the patient is hemodynamically stable, afebrile, nontoxic-appearing. Physical exam remarkable for well appearance, disorientation, otherwise nonfocal neuro exam.  Differentials considered include uremic encephalopathy, hyper or hypoglycemia, infection, ACS, substance abuse, polypharmacy. I am most concerned for uremic encephalopathy or other electrolyte abnormality. EKG unchanged from prior.   Labs remarkable for intermediate troponins, uremia, anion  gap acidosis likely from uremia, stable anemia. CT head with no acute changes. On  reevaluation, patient complaining of pain everywhere, including chest and abdomen. Abdomen, chest, and extremities with generalized tenderness. Provided tylenol for pain.   Consulted hospitalist for admission for AMS, possibly related to uremia. Patient and son amenable to plan for admission.   The care of this patient was overseen by Dr. Ralene Bathe, who agreed with evaluation and plan of care.   Final Clinical Impression(s) / ED Diagnoses Final diagnoses:  Altered mental status, unspecified altered mental status type    Rx / DC Orders ED Discharge Orders    None       Launa Flight, MD 10/24/19 Remonia Richter    Quintella Reichert, MD 10/25/19 [phone]

## 2019-10-24 NOTE — ED Notes (Signed)
Per dialysis nurse hold daily meds at this time as they will be dialyzed out of patient's system

## 2019-10-24 NOTE — ED Triage Notes (Signed)
Patient arrives to ED with complaints of altered mental status. Pt brought in by son who states pt has missed last two dialysis treatments and has been confused over the last couple of days. Pt states "I don't know why I am here." Pt son called to come to room so more data can be collected.

## 2019-10-25 DIAGNOSIS — R4182 Altered mental status, unspecified: Secondary | ICD-10-CM | POA: Diagnosis not present

## 2019-10-25 LAB — BASIC METABOLIC PANEL
Anion gap: 21 — ABNORMAL HIGH (ref 5–15)
BUN: 33 mg/dL — ABNORMAL HIGH (ref 8–23)
CO2: 23 mmol/L (ref 22–32)
Calcium: 8.6 mg/dL — ABNORMAL LOW (ref 8.9–10.3)
Chloride: 91 mmol/L — ABNORMAL LOW (ref 98–111)
Creatinine, Ser: 6.38 mg/dL — ABNORMAL HIGH (ref 0.61–1.24)
GFR, Estimated: 9 mL/min — ABNORMAL LOW (ref >60)
Glucose, Bld: 115 mg/dL — ABNORMAL HIGH (ref 70–99)
Potassium: 3.9 mmol/L (ref 3.5–5.1)
Sodium: 135 mmol/L (ref 135–145)

## 2019-10-25 LAB — GLUCOSE, CAPILLARY: Glucose-Capillary: 77 mg/dL (ref 70–99)

## 2019-10-25 LAB — RPR: RPR Ser Ql: NONREACTIVE

## 2019-10-25 MED ORDER — RENA-VITE PO TABS
1.0000 | ORAL_TABLET | Freq: Every day | ORAL | Status: DC
  Administered 2019-10-25 – 2019-10-31 (×7): 1 via ORAL
  Filled 2019-10-25 (×7): qty 1

## 2019-10-25 MED ORDER — NEPRO/CARBSTEADY PO LIQD
237.0000 mL | Freq: Two times a day (BID) | ORAL | Status: DC
  Administered 2019-10-25 – 2019-11-01 (×11): 237 mL via ORAL

## 2019-10-25 MED ORDER — CHLORHEXIDINE GLUCONATE CLOTH 2 % EX PADS
6.0000 | MEDICATED_PAD | Freq: Every day | CUTANEOUS | Status: DC
  Administered 2019-10-31 – 2019-11-01 (×2): 6 via TOPICAL

## 2019-10-25 NOTE — TOC Initial Note (Signed)
Transition of Care [facility]) - Initial/Assessment Note    Patient Details  Name: Duane Ortiz MRN: [id number] Date of Birth: September 19, 1950  [facility] CM/SW Contact:    Sable Feil, LCSW Phone Number: 10/25/2019, 1:15 PM  Clinical Narrative:  Talked with patient at the bedside. [name] was sitting up in a chair with his lunch tray in front of him. When asked, he was agreeable to talking with CSW.  Patient was informed of CSW's role and PT/OT's recommendations of ST rehab. CSW explained the recommendation of PT/OT and short-term rehab at a skilled nursing facility. Patient declined ST rehab and during his conversation informed CSW that he has a walker with wheels and a cane at home. He also reported that a nurse comes to his home and works with him on walking. He acknowledged having therapy at home.  During the conversation patient indicated his plan to move back to [place] he is from, and added that his daughter and granddaughter live there. He added that his oldest son died, and his sons [name] and Jiles Prows and his ex-wife live in [place]. When asked, patient gave permission for his sons to be contacted.  Patient reported that he has had the [place] vaccinations - received at a church in [place]. Mr. [place]ington informed that CSW will visit with him after taking with his son.    Expected Discharge Plan: [place] Barriers to Discharge: Continued Medical Work up   Patient Goals and CMS Choice Patient states their goals for this hospitalization and ongoing recovery are:: Patient wants to get better and return home and eventually move back to [place], [facility] CMS Medicare.gov Compare Post Acute Care list provided to:: Other (Comment Required) (List not provided, as patient declined SNF for ST rehab) Choice offered to / list presented to : NA  Expected Discharge Plan and Services Expected Discharge Plan: [place] In-house Referral: Clinical Social Work     Living arrangements for the past 2 months: Apartment                                      Prior Living Arrangements/Services Living arrangements for the past 2 months: Apartment Lives with:: Self Patient language and need for interpreter reviewed:: No Do you feel safe going back to the place where you live?: Yes      Need for Family Participation in Patient Care: Yes (Comment) Care giver support system in place?: No (comment)   Criminal Activity/Legal Involvement Pertinent to Current Situation/Hospitalization: No - Comment as needed  Activities of Daily Living      Permission Sought/Granted Permission sought to share information with : Family Supports Permission granted to share information with : Yes, Verbal Permission Granted  Share Information with NAME: Kem Kays or Lillia Abed     Permission granted to share info w Relationship: Sons  Permission granted to share info w Contact Information: Tamala Bari - [name] [phone]  Emotional Assessment Appearance:: Appears younger than stated age Attitude/Demeanor/Rapport: Engaged Affect (typically observed): Pleasant Orientation: : Oriented to Self, Oriented to Place, Oriented to Situation Alcohol / Substance Use: Tobacco Use, Alcohol Use, Illicit Drugs (Per H&P patient smokes, has previous alcohol use and does not use illicit drugs) Psych Involvement: No (comment)  Admission diagnosis:  Altered mental status, unspecified altered mental status type [R41.82] AMS (altered mental status) [R41.82] Patient Active  Problem List   Diagnosis Date Noted  . AMS (altered mental status) 10/01/2019  . Acute encephalopathy 09/30/2019  . ESRD (end stage renal disease) ([place]) 09/21/2019  . Hyperkalemia, diminished renal excretion 09/21/2019  . Anemia 08/28/2019  . Disorder of mineral metabolism, unspecified 08/28/2019  . Hyperkalemia 08/28/2019  .  Hypomagnesemia 08/28/2019  . Metabolic acidosis 52/84/1324  . Mycosis 08/28/2019  . Tinea pedis 08/28/2019  . Peripheral neuropathy 04/02/2019  . Coagulation defect ([place]) 07/12/2018  . History of pulmonary embolism 07/12/2018  . Insomnia 07/12/2018  . Lumbar disc disease with radiculopathy 07/12/2018  . GERD (gastroesophageal reflux disease) 04/24/2018  . Hypertensive kidney disease with stage 4 chronic kidney disease ([place]) 03/19/2018  . Allergic rhinitis 03/01/2018  . Unstable gait 03/01/2018  . Chronic lower back pain 02/13/2018  . Gout involving toe of left foot 02/13/2018  . History of alcohol abuse 02/13/2018  . Overweight (BMI 25.0-29.9) 02/13/2018  . Vitamin D deficiency 02/13/2018  . Essential hypertension 04/27/2017  . Type 2 diabetes mellitus with renal complication ([place]) 40/10/2723  . Gait abnormality 10/11/2016  . Diabetic peripheral neuropathy ([place]) 08/25/2016  . Disc degeneration, lumbar 08/25/2016  . 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 Dyspnea on exertion 05/22/2015  . Hypercholesteremia   . Gout   . Pulmonary embolism ([place])   . Chronic kidney disease   . Hypercholesterolemia    PCP:  Sandi Mariscal, MD Pharmacy:   [facility] [place], [address] [place] [address] Phone: [phone] Fax: [phone]     Social Determinants of Health (SDOH) Interventions  No SDOH interventions requested or needed at time   Readmission Risk Interventions Readmission Risk Prevention Plan 10/03/2019  Transportation Screening Complete  PCP or Specialist Appt within 3-5 Days Not Complete  HRI or [place] Complete  Social Work Consult for [place] Planning/Counseling Complete  Palliative Care Screening Not Applicable  Medication Review Press photographer) Complete  Some recent data might be hidden

## 2019-10-25 NOTE — Progress Notes (Signed)
PROGRESS NOTE    Duane Ortiz  BDZ:[id number] DOB: [birth year] DOA: 10/24/2019 PCP: Sandi Mariscal, MD    Brief Narrative:  69 year old male with history of ESRD and hemodialysis, hypertension, PE on Eliquis presented with altered mental status, bizarre behavior and wandering in the street.  Patient was recently started on hemodialysis on 9/18.  According to the family, he lives alone, his sons check on him every day.  Apparently he missed the shuttle to go to dialysis on Tuesday.  Patient was wandering around the neighborhood.  Son found him outside the house totally confused so brought to the ER.  Apparently does have history of insomnia and some cognitive dysfunction in the past. In the emergency room he was confused otherwise stable.  Underwent emergent dialysis.   Assessment & Plan:   Active Problems:   AMS (altered mental status)  Acute uremic metabolic encephalopathy: Underwent emergent dialysis with some improvement.  No evidence of any infection or CNS process.  Continue to monitor.  ESRD on hemodialysis with uremia: Underwent dialysis in the hospital.  Followed by nephrology.  Hypertension: Blood pressure stable on home medications.  History of PE: Therapeutic on Eliquis.  Social: Apparently lives alone with recently worsening cognition, missing hemodialysis.  Family is looking for more supervised care.  According to the patient's son, they are exploring options for him to go to a nursing home in [place].  DVT prophylaxis:  apixaban (ELIQUIS) tablet 5 mg   Code Status: Full code Family Communication: Patient's son Jiles Prows on the phone. Disposition Plan: Status is: Inpatient  Remains inpatient appropriate because:Unsafe d/c plan   Dispo: The patient is from: Home              Anticipated d/c is to: SNF              Anticipated d/c date is: 2 days              Patient currently is not medically stable to d/c.         Consultants:   Nephrology  Procedures:    Dialysis  Antimicrobials:   None   Subjective: Patient seen and examined.  No overnight events.  He was fairly comfortable, oriented.  He was slightly impulsive to work with PT. Patient stated that his son brought him to the hospital, he does not know why he brought him.  Objective: Vitals:   10/25/19 0015 10/25/19 0056 10/25/19 0607 10/25/19 0845  BP: (!) 115/59 (!) 151/65 (!) 150/65 (!) 142/67  Pulse: 60 67 72 72  Resp: 17 (!) 21 15 16   Temp: 98.6 F (37 C) 98.4 F (36.9 C) 98.7 F (37.1 C) 98.7 F (37.1 C)  TempSrc: Oral  Oral Oral  SpO2: 100% 96% 99% 100%  Weight:  86.5 kg      Intake/Output Summary (Last 24 hours) at 10/25/2019 1325 Last data filed at 10/25/2019 1300 Gross per 24 hour  Intake 600 ml  Output 2783 ml  Net -2183 ml   Filed Weights   10/25/19 0056  Weight: 86.5 kg    Examination:  General exam: Appears calm and comfortable  Working with physical therapy on my evaluation.  Looks comfortable. Patient was alert and awake.  He was oriented x2. Left upper extremity AV fistula with thrill. Respiratory system: Clear to auscultation. Respiratory effort normal.  No added sounds. Cardiovascular system: S1 & S2 heard, RRR.  Gastrointestinal system: Abdomen is nondistended, soft and nontender. No organomegaly or masses felt. Normal bowel  sounds heard. Central nervous system: Alert and oriented. No focal neurological deficits. Extremities: Symmetric 5 x 5 power. Skin: No rashes, lesions or ulcers Psychiatry: Judgement and insight appear normal. Mood & affect appropriate.     Data Reviewed: I have personally reviewed following labs and imaging studies  CBC: Recent Labs  Lab 10/24/19 1107  WBC 9.1  NEUTROABS 6.6  HGB 8.6*  HCT 29.7*  MCV 97.1  PLT 419   Basic Metabolic Panel: Recent Labs  Lab 10/24/19 1107 10/25/19 0535  NA 142 135  K 4.6 3.9  CL 100 91*  CO2 18* 23  GLUCOSE 75 115*  BUN 102* 33*  CREATININE 12.68* 6.38*  CALCIUM  8.3* 8.6*   GFR: Estimated Creatinine Clearance: 11.2 mL/min (A) (by C-G formula based on SCr of 6.38 mg/dL (H)). Liver Function Tests: Recent Labs  Lab 10/24/19 1107  AST 23  ALT 14  ALKPHOS 55  BILITOT 0.8  PROT 6.9  ALBUMIN 3.3*   No results for input(s): LIPASE, AMYLASE in the last 168 hours. No results for input(s): AMMONIA in the last 168 hours. Coagulation Profile: No results for input(s): INR, PROTIME in the last 168 hours. Cardiac Enzymes: No results for input(s): CKTOTAL, CKMB, CKMBINDEX, TROPONINI in the last 168 hours. BNP (last 3 results) No results for input(s): PROBNP in the last 8760 hours. HbA1C: No results for input(s): HGBA1C in the last 72 hours. CBG: Recent Labs  Lab 10/24/19 1012 10/25/19 0121  GLUCAP 70 77   Lipid Profile: No results for input(s): CHOL, HDL, LDLCALC, TRIG, CHOLHDL, LDLDIRECT in the last 72 hours. Thyroid Function Tests: Recent Labs    10/24/19 1544  TSH 0.663   Anemia Panel: No results for input(s): VITAMINB12, FOLATE, FERRITIN, TIBC, IRON, RETICCTPCT in the last 72 hours. Sepsis Labs: No results for input(s): PROCALCITON, LATICACIDVEN in the last 168 hours.  Recent Results (from the past 240 hour(s))  Respiratory Panel by RT PCR (Flu A&B, Covid) - Nasopharyngeal Swab     Status: None   Collection Time: 10/24/19  2:46 PM   Specimen: Nasopharyngeal Swab  Result Value Ref Range Status   SARS Coronavirus 2 by RT PCR NEGATIVE NEGATIVE Final    Comment: (NOTE) SARS-CoV-2 target nucleic acids are NOT DETECTED.  The SARS-CoV-2 RNA is generally detectable in upper respiratoy specimens during the acute phase of infection. The lowest concentration of SARS-CoV-2 viral copies this assay can detect is 131 copies/mL. A negative result does not preclude SARS-Cov-2 infection and should not be used as the sole basis for treatment or other patient management decisions. A negative result may occur with  improper specimen  collection/handling, submission of specimen other than nasopharyngeal swab, presence of viral mutation(s) within the areas targeted by this assay, and inadequate number of viral copies (<131 copies/mL). A negative result must be combined with clinical observations, patient history, and epidemiological information. The expected result is Negative.  Fact Sheet for Patients:  PinkCheek.be  Fact Sheet for Healthcare Providers:  GravelBags.it  This test is no t yet approved or cleared by the [place] FDA and  has been authorized for detection and/or diagnosis of SARS-CoV-2 by FDA under an Emergency Use Authorization (EUA). This EUA will remain  in effect (meaning this test can be used) for the duration of the COVID-19 declaration under Section 564(b)(1) of the Act, 21 U.S.C. section 360bbb-3(b)(1), unless the authorization is terminated or revoked sooner.     Influenza A by PCR NEGATIVE NEGATIVE Final   Influenza  B by PCR NEGATIVE NEGATIVE Final    Comment: (NOTE) The Xpert Xpress SARS-CoV-2/FLU/RSV assay is intended as an aid in  the diagnosis of influenza from Nasopharyngeal swab specimens and  should not be used as a sole basis for treatment. Nasal washings and  aspirates are unacceptable for Xpert Xpress SARS-CoV-2/FLU/RSV  testing.  Fact Sheet for Patients: PinkCheek.be  Fact Sheet for Healthcare Providers: GravelBags.it  This test is not yet approved or cleared by the [place] FDA and  has been authorized for detection and/or diagnosis of SARS-CoV-2 by  FDA under an Emergency Use Authorization (EUA). This EUA will remain  in effect (meaning this test can be used) for the duration of the  Covid-19 declaration under Section 564(b)(1) of the Act, 21  U.S.C. section 360bbb-3(b)(1), unless the authorization is  terminated or revoked. Performed at [facility], [place] [address] [place], [place] [address]          Radiology Studies: CT HEAD WO CONTRAST  Result Date: 10/24/2019 CLINICAL DATA:  Mental status change of unknown cause. EXAM: CT HEAD WITHOUT CONTRAST TECHNIQUE: Contiguous axial images were obtained from the base of the skull through the vertex without intravenous contrast. COMPARISON:  09/30/2019 FINDINGS: Brain: No evidence of acute infarction, hemorrhage, hydrocephalus, extra-axial collection or mass lesion/mass effect. Confluent chronic small vessel ischemia in the cerebral white matter. Mild cerebral volume loss Vascular: Atherosclerotic calcification. Skull: Normal. Negative for fracture or focal lesion. Sinuses/Orbits: Negative IMPRESSION: 1. No acute finding. 2. Confluent chronic small vessel ischemia in the cerebral white matter. Electronically Signed   By: Monte Fantasia M.D.   On: 10/24/2019 10:40   DG Chest Port 1 View  Result Date: 10/24/2019 CLINICAL DATA:  Altered mental status.  Missed dialysis. EXAM: PORTABLE CHEST 1 VIEW COMPARISON:  09/30/2019 FINDINGS: Cardiomediastinal silhouette is accentuated by low lung volumes and AP portable technique. Low lung volumes. No consolidation. No visible pleural effusions or pneumothorax. The visualized skeletal structures are unremarkable. Left upper arm vascular stent. IMPRESSION: No acute cardiopulmonary disease. Electronically Signed   By: Margaretha Sheffield MD   On: 10/24/2019 13:09        Scheduled Meds: . allopurinol  100 mg Oral Daily  . amLODipine  5 mg Oral Daily  . apixaban  5 mg Oral BID  . [START ON 10/26/2019] Chlorhexidine Gluconate Cloth  6 each Topical Q0600  . [START ON 10/26/2019] doxercalciferol  3 mcg Intravenous Q T,Th,Sa-HD  . feeding supplement (NEPRO CARB STEADY)  237 mL Oral BID BM  . ferric citrate  210 mg Oral TID WC  . heparin  2,000 Units Dialysis Once in dialysis  . hydrALAZINE  25 mg Oral TID  . hydrocerin   Topical Daily  .  melatonin  3 mg Oral QHS  . multivitamin  1 tablet Oral QHS  . nebivolol  10 mg Oral QHS  . tamsulosin  0.4 mg Oral QHS  . Vitamin D (Ergocalciferol)  50,000 Units Oral Weekly   Continuous Infusions: . [START ON 10/26/2019] ferric gluconate (FERRLECIT/NULECIT) IV       LOS: 1 day    Time spent: 30 minutes    Barb Merino, MD [facility] Pager [phone]

## 2019-10-25 NOTE — Evaluation (Signed)
Physical Therapy Evaluation Patient Details Name: Duane Ortiz MRN: [id number] DOB: 03-02-1950 Today's Date: 10/25/2019   History of Present Illness  69yo male presenting wtih AMS, also recently missed an HD session and found wandering around neighborhood. Admitted with acute metabolic encephalopathy. PMH CKD, DM with peripheral neuropathy, gait abnormality, gout, HTN, hx PE, cervical surgery, hx wrist surgery  Clinical Impression   Patient received in bed on phone, required Mod cues to interact with therapist; quite confused this morning and perseverates on telling me "I don't need to ask those questions because I know he knows the answers" but had difficulty giving me correct information on all orientation questions. Very unsafe with mobility overall, required ModA for all transfers as well as [place] walking in room with RW due to extreme safety impairment, became increasingly more annoyed and agitated with therapist when corrected for safety purposes. Left up in recliner with all needs met, chair alarm active. Feel he really would benefit from SNF and 24/7A for safety and ongoing rehab moving forward.     Follow Up Recommendations SNF;Supervision/Assistance - 24 hour;Other (comment) (if he refuses, will need HHPT and 24/7A)    Equipment Recommendations  Rolling walker with 5" wheels;3in1 (PT)    Recommendations for Other Services       Precautions / Restrictions Precautions Precautions: Fall;Other (comment) Precaution Comments: easily agitated when cues are given Restrictions Weight Bearing Restrictions: No      Mobility  Bed Mobility Overal bed mobility: Needs Assistance Bed Mobility: Supine to Sit     Supine to sit: Min guard     General bed mobility comments: min guard/extended time and increased effort    Transfers Overall transfer level: Needs assistance Equipment used: Rolling walker (2 wheeled) Transfers: Sit to/from Stand Sit to Stand: Mod assist          General transfer comment: ModA to really boost all the way up to standing and gain balance, mild posterior bias  Ambulation/Gait Ambulation/Gait assistance: Mod assist Gait Distance (Feet): 20 Feet Assistive device: Rolling walker (2 wheeled) Gait Pattern/deviations: Decreased stride length;Trunk flexed;Step-to pattern;Decreased step length - right;Decreased step length - left;Drifts right/left Gait velocity: decr   General Gait Details: extremely unsafe with RW, reaching for items around the room such as end of bed and door knob in bathroom; got agitated when corrected by PT and did not follow cues for safety well at all  Stairs            Wheelchair Mobility    Modified Rankin (Stroke Patients Only)       Balance Overall balance assessment: Needs assistance Sitting-balance support: No upper extremity supported;Feet supported Sitting balance-Leahy Scale: Good     Standing balance support: Bilateral upper extremity supported Standing balance-Leahy Scale: Poor Standing balance comment: reliant on external assist                             Pertinent Vitals/Pain Pain Assessment: Faces Faces Pain Scale: No hurt Pain Intervention(s): Limited activity within patient's tolerance;Monitored during session;Repositioned    Home Living Family/patient expects to be discharged to:: Private residence Living Arrangements: Alone;Other (Comment) (sons can check in on him in the evenings) Available Help at Discharge: Family;Available PRN/intermittently Type of Home: Apartment Home Access: Elevator     Home Layout: One level Home Equipment: Cane - single point;Walker - 2 wheels      Prior Function Level of Independence: Independent with assistive device(s)  Comments: patient very confused and unclear on living situation today- most past information taken from prior chart review     Hand Dominance   Dominant Hand: Right    Extremity/Trunk Assessment    Upper Extremity Assessment Upper Extremity Assessment: Defer to OT evaluation    Lower Extremity Assessment Lower Extremity Assessment: Generalized weakness    Cervical / Trunk Assessment Cervical / Trunk Assessment: Kyphotic  Communication   Communication: No difficulties  Cognition Arousal/Alertness: Awake/alert Behavior During Therapy: Flat affect Overall Cognitive Status: Impaired/Different from baseline Area of Impairment: Orientation;Following commands;Safety/judgement;Problem solving;Attention;Memory;Awareness                 Orientation Level: Disoriented to;Time;Situation Current Attention Level: Sustained Memory: Decreased short-term memory Following Commands: Follows one step commands with increased time;Follows one step commands inconsistently Safety/Judgement: Decreased awareness of deficits Awareness: Intellectual Problem Solving: Slow processing;Decreased initiation;Difficulty sequencing;Requires verbal cues;Requires tactile cues General Comments: lists his birthday as the current month and year even with hints given; very poor insights into mobility deficits and safety, gets agitated when cued and corrected      General Comments      Exercises     Assessment/Plan    PT Assessment Patient needs continued PT services  PT Problem List Decreased strength;Decreased mobility;Decreased safety awareness;Decreased activity tolerance;Decreased cognition;Decreased balance;Decreased knowledge of use of DME;Pain;Decreased coordination       PT Treatment Interventions DME instruction;Therapeutic activities;Gait training;Therapeutic exercise;Patient/family education;Balance training;Functional mobility training;Neuromuscular re-education;Stair training    PT Goals (Current goals can be found in the Care Plan section)  Acute Rehab PT Goals PT Goal Formulation: Patient unable to participate in goal setting Time For Goal Achievement: 11/08/19 Potential to Achieve  Goals: Fair    Frequency Min 3X/week   Barriers to discharge        Co-evaluation               AM-PAC PT "6 Clicks" Mobility  Outcome Measure Help needed turning from your back to your side while in a flat bed without using bedrails?: None Help needed moving from lying on your back to sitting on the side of a flat bed without using bedrails?: A Little Help needed moving to and from a bed to a chair (including a wheelchair)?: A Little Help needed standing up from a chair using your arms (e.g., wheelchair or bedside chair)?: A Lot Help needed to walk in hospital room?: A Lot Help needed climbing 3-5 steps with a railing? : A Lot 6 Click Score: 16    End of Session Equipment Utilized During Treatment: Gait belt Activity Tolerance: Treatment limited secondary to agitation;Other (comment) (limited by willingness to participate in session) Patient left: with call bell/phone within reach;in chair;with chair alarm set Nurse Communication: Mobility status PT Visit Diagnosis: Other abnormalities of gait and mobility (R26.89);Unsteadiness on feet (R26.81)    Time: 1007-1219 PT Time Calculation (min) (ACUTE ONLY): 23 min   Charges:   PT Evaluation $PT Eval Moderate Complexity: 1 Mod PT Treatments $Gait Training: 8-22 mins        Windell Norfolk, DPT, PN1   Supplemental Physical Therapist [place]    Pager [phone] Acute Rehab Office [phone]

## 2019-10-25 NOTE — Progress Notes (Signed)
NURSING PROGRESS NOTE  Duane Ortiz [id number] Admission Data: 10/25/2019 3:33 AM Attending Provider: Lequita Halt, MD PCP:Sun, Mikeal Hawthorne, MD Code Status: FULL  Duane Ortiz is a 69 y.o. male patient admitted from ED: Transferred to hemodialysis. No report was given from ED RN to the unit. According to HD RN [place], no report was given from ED.   -No acute distress noted.  -No complaints of shortness of breath.  -No complaints of chest pain.   Cardiac Monitoring: Box # 11 in place. Cardiac monitor yields:normal sinus rhythm.  Blood pressure (!) 151/65, pulse 67, temperature 98.4 F (36.9 C), resp. rate (!) 21, weight 86.5 kg, SpO2 96 %.   IV Fluids:  IV in place, occlusive dsg intact without redness, IV cath forearm right, condition patent and no redness none.  Left UA graft positive bruit/thrill  Allergies:  Baclofen, Gabapentin, and Sulfa antibiotics  Past Medical History:   has a past medical history of Arthritis, Chronic kidney disease, Chronic kidney disease, Diabetes ([place]) (02/012017), Fainting, Gait abnormality (10/11/2016), GERD (gastroesophageal reflux disease), Gout, Headache, Hypercholesteremia, Hypercholesterolemia, Hypertension, Peripheral neuropathy (04/02/2019), Pulmonary embolism ([place]), Shortness of breath, Sleep apnea, and Wears dentures.  Past Surgical History:   has a past surgical history that includes Cervical discectomy; Colonoscopy; neck spine disk surgery (01/03/1993); Multiple tooth extractions; Wrist surgery; AV fistula placement (Left, 08/15/2019); A/V SHUNTOGRAM (N/A, 09/16/2019); and PERIPHERAL VASCULAR INTERVENTION (Left, 09/16/2019).  Social History:   reports that he has been smoking cigarettes. He has been smoking about 0.25 packs per day. He has never used smokeless tobacco. He reports previous alcohol use. He reports that he does not use drugs.  Skin: Patient has Bruised [place] Great toe on her right foot. The toe next to the great toe on his right foot is  bruised and swollen. Old callused sore on the pad of patient's great toe on his left foot. Middle toe is bruised as well as the 4th toe on his left foot.   Patient/Family orientated to room. Information packet given to patient/family. Admission inpatient armband information verified with patient/family to include name and date of birth and placed on patient arm. Side rails up x 2, fall assessment and education completed with patient/family. Patient/family able to verbalize understanding of risk associated with falls and verbalized understanding to call for assistance before getting out of bed. Call light within reach. Patient/family able to voice and demonstrate understanding of unit orientation instructions.    Will continue to evaluate and treat per MD orders.

## 2019-10-25 NOTE — Progress Notes (Signed)
[facility] Progress Note   Dialysis Orders: TTS [facility]   4h  85.5kg  2/2.5 bath  Hep2000  AVG  - hect 3 tiw  - venofer 50 qwk  - mircera 150 q 2, last 10/14  Assessment/Plan: 1. AMS - ? Uremic due to missed HD x 2 - no obvious source of infection or acute findings on HD CT 2. ESRD - TTS - relatively new start 9/23 K 3.9 Cr 12 >6 post HD - HD Saturday - d/c bicarb for now - I think CO2 ok with adequate HD 3. Anemia - hgb 8.6 -continue weekly Fe - mircera due next week  4. Secondary hyperparathyroidism - d/c calcitriol daily - not on at HD - continue  hectorol 3 5. HTN/volume - CXR neg - net UF 2.8 L Thursday - post HD wt 86.5 -titrate to outpt HD tomorrow- get standing wt  6. Nutrition - alb 3.3 / vitamin/suppl 7. Disp - PT recommends SNF and 24/7 assist for safety and rehab (currently lives alone and sons stop by in evenings.   Myriam Jacobson, PA-C [facility] Beeper [phone] 10/25/2019,10:37 AM  LOS: 1 day   Subjective:   Cannot explain why he is here except tells me that his son brought him.  Objective Vitals:   10/25/19 0015 10/25/19 0056 10/25/19 0607 10/25/19 0845  BP: (!) 115/59 (!) 151/65 (!) 150/65 (!) 142/67  Pulse: 60 67 72 72  Resp: 17 (!) 21 15 16   Temp: 98.6 F (37 C) 98.4 F (36.9 C) 98.7 F (37.1 C) 98.7 F (37.1 C)  TempSrc: Oral  Oral Oral  SpO2: 100% 96% 99% 100%  Weight:  86.5 kg     Physical Exam General: pleasant and cooperative Heart: RRR Lungs: no rales Abdomen: obese soft NT Extremities: no LE edema Dialysis Access:  Left upper AVGG - + bruit -   Additional Objective Labs: Basic Metabolic Panel: Recent Labs  Lab 10/24/19 1107 10/25/19 0535  NA 142 135  K 4.6 3.9  CL 100 91*  CO2 18* 23  GLUCOSE 75 115*  BUN 102* 33*  CREATININE 12.68* 6.38*  CALCIUM 8.3* 8.6*   Liver Function Tests: Recent Labs  Lab 10/24/19 1107  AST 23  ALT 14  ALKPHOS 55  BILITOT 0.8  PROT 6.9  ALBUMIN 3.3*   No  results for input(s): LIPASE, AMYLASE in the last 168 hours. CBC: Recent Labs  Lab 10/24/19 1107  WBC 9.1  NEUTROABS 6.6  HGB 8.6*  HCT 29.7*  MCV 97.1  PLT 286   Blood Culture No results found for: SDES, SPECREQUEST, CULT, REPTSTATUS  Cardiac Enzymes: No results for input(s): CKTOTAL, CKMB, CKMBINDEX, TROPONINI in the last 168 hours. CBG: Recent Labs  Lab 10/24/19 1012 10/25/19 0121  GLUCAP 70 77   Iron Studies: No results for input(s): IRON, TIBC, TRANSFERRIN, FERRITIN in the last 72 hours. Lab Results  Component Value Date   INR 1.2 09/30/2019   Studies/Results: CT HEAD WO CONTRAST  Result Date: 10/24/2019 CLINICAL DATA:  Mental status change of unknown cause. EXAM: CT HEAD WITHOUT CONTRAST TECHNIQUE: Contiguous axial images were obtained from the base of the skull through the vertex without intravenous contrast. COMPARISON:  09/30/2019 FINDINGS: Brain: No evidence of acute infarction, hemorrhage, hydrocephalus, extra-axial collection or mass lesion/mass effect. Confluent chronic small vessel ischemia in the cerebral white matter. Mild cerebral volume loss Vascular: Atherosclerotic calcification. Skull: Normal. Negative for fracture or focal lesion. Sinuses/Orbits: Negative IMPRESSION: 1. No acute  finding. 2. Confluent chronic small vessel ischemia in the cerebral white matter. Electronically Signed   By: Monte Fantasia M.D.   On: 10/24/2019 10:40   DG Chest Port 1 View  Result Date: 10/24/2019 CLINICAL DATA:  Altered mental status.  Missed dialysis. EXAM: PORTABLE CHEST 1 VIEW COMPARISON:  09/30/2019 FINDINGS: Cardiomediastinal silhouette is accentuated by low lung volumes and AP portable technique. Low lung volumes. No consolidation. No visible pleural effusions or pneumothorax. The visualized skeletal structures are unremarkable. Left upper arm vascular stent. IMPRESSION: No acute cardiopulmonary disease. Electronically Signed   By: Margaretha Sheffield MD   On: 10/24/2019  13:09   Medications: . [START ON 10/26/2019] ferric gluconate (FERRLECIT/NULECIT) IV     . allopurinol  100 mg Oral Daily  . amLODipine  5 mg Oral Daily  . apixaban  5 mg Oral BID  . calcitRIOL  0.25 mcg Oral Daily  . Chlorhexidine Gluconate Cloth  6 each Topical Q0600  . [START ON 10/26/2019] doxercalciferol  3 mcg Intravenous Q T,Th,Sa-HD  . ferric citrate  210 mg Oral TID WC  . heparin  2,000 Units Dialysis Once in dialysis  . hydrALAZINE  25 mg Oral TID  . hydrocerin   Topical Daily  . melatonin  3 mg Oral QHS  . nebivolol  10 mg Oral QHS  . sodium bicarbonate  1,300 mg Oral BID  . tamsulosin  0.4 mg Oral QHS  . Vitamin D (Ergocalciferol)  50,000 Units Oral Weekly

## 2019-10-26 DIAGNOSIS — N186 End stage renal disease: Secondary | ICD-10-CM | POA: Diagnosis not present

## 2019-10-26 DIAGNOSIS — Z992 Dependence on renal dialysis: Secondary | ICD-10-CM

## 2019-10-26 DIAGNOSIS — R4182 Altered mental status, unspecified: Secondary | ICD-10-CM | POA: Diagnosis not present

## 2019-10-26 LAB — CBC
HCT: 27.5 % — ABNORMAL LOW (ref 39.0–52.0)
Hemoglobin: 8.7 g/dL — ABNORMAL LOW (ref 13.0–17.0)
MCH: 28.8 pg (ref 26.0–34.0)
MCHC: 31.6 g/dL (ref 30.0–36.0)
MCV: 91.1 fL (ref 80.0–100.0)
Platelets: 259 10*3/uL (ref 150–400)
RBC: 3.02 MIL/uL — ABNORMAL LOW (ref 4.22–5.81)
RDW: 14.6 % (ref 11.5–15.5)
WBC: 6.4 10*3/uL (ref 4.0–10.5)
nRBC: 0 % (ref 0.0–0.2)

## 2019-10-26 LAB — RENAL FUNCTION PANEL
Albumin: 2.8 g/dL — ABNORMAL LOW (ref 3.5–5.0)
Anion gap: 14 (ref 5–15)
BUN: 45 mg/dL — ABNORMAL HIGH (ref 8–23)
CO2: 27 mmol/L (ref 22–32)
Calcium: 8.2 mg/dL — ABNORMAL LOW (ref 8.9–10.3)
Chloride: 94 mmol/L — ABNORMAL LOW (ref 98–111)
Creatinine, Ser: 8.65 mg/dL — ABNORMAL HIGH (ref 0.61–1.24)
GFR, Estimated: 6 mL/min — ABNORMAL LOW (ref >60)
Glucose, Bld: 163 mg/dL — ABNORMAL HIGH (ref 70–99)
Phosphorus: 7.3 mg/dL — ABNORMAL HIGH (ref 2.5–4.6)
Potassium: 3.4 mmol/L — ABNORMAL LOW (ref 3.5–5.1)
Sodium: 135 mmol/L (ref 135–145)

## 2019-10-26 LAB — URINALYSIS, COMPLETE (UACMP) WITH MICROSCOPIC
Bacteria, UA: NONE SEEN
Bilirubin Urine: NEGATIVE
Glucose, UA: NEGATIVE mg/dL
Ketones, ur: NEGATIVE mg/dL
Leukocytes,Ua: NEGATIVE
Nitrite: NEGATIVE
Protein, ur: 100 mg/dL — AB
Specific Gravity, Urine: 1.013 (ref 1.005–1.030)
pH: 5 (ref 5.0–8.0)

## 2019-10-26 LAB — GLUCOSE, CAPILLARY: Glucose-Capillary: 93 mg/dL (ref 70–99)

## 2019-10-26 LAB — RAPID URINE DRUG SCREEN, HOSP PERFORMED
Amphetamines: NOT DETECTED
Barbiturates: NOT DETECTED
Benzodiazepines: NOT DETECTED
Cocaine: NOT DETECTED
Opiates: NOT DETECTED
Tetrahydrocannabinol: NOT DETECTED

## 2019-10-26 LAB — HEPATITIS B SURFACE ANTIGEN: Hepatitis B Surface Ag: NONREACTIVE

## 2019-10-26 MED ORDER — LIDOCAINE HCL (PF) 1 % IJ SOLN: 5.0000 mL | INTRAMUSCULAR | Status: DC | PRN

## 2019-10-26 MED ORDER — ALTEPLASE 2 MG IJ SOLR: 2.0000 mg | Freq: Once | INTRAMUSCULAR | Status: DC | PRN

## 2019-10-26 MED ORDER — HEPARIN SODIUM (PORCINE) 1000 UNIT/ML IJ SOLN
INTRAMUSCULAR | Status: AC
  Filled 2019-10-26: qty 2

## 2019-10-26 MED ORDER — SODIUM CHLORIDE 0.9 % IV SOLN: 100.0000 mL | INTRAVENOUS | Status: DC | PRN

## 2019-10-26 MED ORDER — HEPARIN SODIUM (PORCINE) 1000 UNIT/ML DIALYSIS
20.0000 [IU]/kg | INTRAMUSCULAR | Status: DC | PRN
  Administered 2019-10-26: 1700 [IU] via INTRAVENOUS_CENTRAL

## 2019-10-26 MED ORDER — HEPARIN SODIUM (PORCINE) 1000 UNIT/ML DIALYSIS: 1000.0000 [IU] | INTRAMUSCULAR | Status: DC | PRN

## 2019-10-26 MED ORDER — DOXERCALCIFEROL 4 MCG/2ML IV SOLN
INTRAVENOUS | Status: AC
  Filled 2019-10-26: qty 2

## 2019-10-26 MED ORDER — PENTAFLUOROPROP-TETRAFLUOROETH EX AERO: 1.0000 "application " | INHALATION_SPRAY | CUTANEOUS | Status: DC | PRN

## 2019-10-26 MED ORDER — LIDOCAINE-PRILOCAINE 2.5-2.5 % EX CREA: 1.0000 "application " | TOPICAL_CREAM | CUTANEOUS | Status: DC | PRN

## 2019-10-26 NOTE — Progress Notes (Signed)
OT Cancellation Note  Patient Details Name: Duane Ortiz MRN: [id number] DOB: 09/02/1950   Cancelled Treatment:    Reason Eval/Treat Not Completed: Patient at procedure or test/ unavailable (at HD). Pt off unit at HD this AM. Will re-attempt OT eval after pt returns to unit.  Layla Maw 10/26/2019, 8:31 AM

## 2019-10-26 NOTE — Plan of Care (Signed)
  Problem: Education: Goal: Knowledge of General Education information will improve Description Including pain rating scale, medication(s)/side effects and non-pharmacologic comfort measures Outcome: Progressing   

## 2019-10-26 NOTE — Progress Notes (Signed)
PROGRESS NOTE    Duane Ortiz  HWE:[id number] DOB: [birth year] DOA: 10/24/2019 PCP: Sandi Mariscal, MD    Brief Narrative:  69 year old male with history of ESRD and hemodialysis, hypertension, PE on Eliquis presented with altered mental status, bizarre behavior and wandering in the street.  Patient was recently started on hemodialysis on 9/18.  According to the family, he lives alone, his sons check on him every day.  Apparently he missed the shuttle to go to dialysis on Tuesday.  Patient was wandering around the neighborhood.  Son found him outside the house totally confused so brought to the ER.  Apparently does have history of insomnia and some cognitive dysfunction in the past. In the emergency room he was confused otherwise stable.  Underwent emergent dialysis.  Patient is also having another session of hemodialysis.  Mental status is clear.  He is alert and oriented x3.   Assessment & Plan:   Active Problems:   AMS (altered mental status) Acute uremic metabolic encephalopathy: Underwent emergent dialysis with resolution of encephalopathy. He is currently alert and oriented x3.  He is getting another hemodialysis today. Will consider possible discharge home tomorrow if he is hemodynamically stable. No evidence of any infection or CNS process.  Continue to monitor.  ESRD on hemodialysis with uremia: Underwent dialysis in the hospital.  He is HD days are TTS  Followed by nephrology.  Hypertension: Blood pressure stable on home medications.  History of PE: Continue with Eliquis.  Social: Apparently lives alone with recently worsening cognition, missing hemodialysis.  Family is looking for more supervised care.  According to the patient's son, they are exploring options for him to go to a nursing home in [place].  DVT prophylaxis:  apixaban (ELIQUIS) tablet 5 mg   Code Status: Full code Family Communication: I called and spoke to patient's son Duane Ortiz and discussed patient's  progress and currently at baseline neurological status.  I plan to discharge patient tomorrow and did discuss with son about ensuring getting into hemodialysis on Tuesdays, Thursdays and Saturdays. Disposition Plan: Possible discharge home tomorrow    Dispo: The patient is from: Home              Anticipated d/c is to: Home              Anticipated d/c date is: Tomorrow 10/27/2019              Patient currently is not medically stable to d/c.         Consultants:   Nephrology  Procedures:   Dialysis  Antimicrobials:   None   Subjective: Patient was seen and evaluated at hemodialysis center.  He is alert and oriented and not in acute distress. He is getting hemodialysis.  I discussed with patient about need to ensure he gets his hemodialysis.  Also talked to him about discussing with his son to ensure appropriate plan for him to go to hemodialysis on Tuesdays, Thursdays and Saturdays as scheduled.   Objective: Vitals:   10/26/19 1030 10/26/19 1055 10/26/19 1105 10/26/19 1132  BP: 125/69 125/69 107/63 (!) 130/57  Pulse:  71 76 70  Resp: (!) 22 (!) 23  18  Temp:   97.6 F (36.4 C) 98.1 F (36.7 C)  TempSrc:   Oral Oral  SpO2:    98%  Weight:  85.2 kg      Intake/Output Summary (Last 24 hours) at 10/26/2019 1325 Last data filed at 10/26/2019 1055 Gross per 24 hour  Intake  300 ml  Output 2000 ml  Net -1700 ml   Filed Weights   10/25/19 2200 10/26/19 0645 10/26/19 1055  Weight: 85.7 kg 87.2 kg 85.2 kg    Examination:  General exam: Appears calm and comfortable.  He is alert and oriented x3 .  He is getting hemodialysis today. Left upper extremity AV fistula with thrill. Respiratory system: Clear to auscultation. Respiratory effort normal.   Cardiovascular system: S1 & S2 heard, RRR.  Gastrointestinal system: Abdomen is nondistended, soft and nontender. No organomegaly or masses felt. Normal bowel sounds heard. Central nervous system: Alert and oriented  x3. No focal neurological deficits. Extremities: Symmetric 5 x 5 power. Skin: No rashes, lesions or ulcers Psychiatry: Judgement and insight appear normal. Mood & affect appropriate.     Data Reviewed: I have personally reviewed following labs and imaging studies  CBC: Recent Labs  Lab 10/24/19 1107 10/26/19 0704  WBC 9.1 6.4  NEUTROABS 6.6  --   HGB 8.6* 8.7*  HCT 29.7* 27.5*  MCV 97.1 91.1  PLT 286 947   Basic Metabolic Panel: Recent Labs  Lab 10/24/19 1107 10/25/19 0535 10/26/19 0705  NA 142 135 135  K 4.6 3.9 3.4*  CL 100 91* 94*  CO2 18* 23 27  GLUCOSE 75 115* 163*  BUN 102* 33* 45*  CREATININE 12.68* 6.38* 8.65*  CALCIUM 8.3* 8.6* 8.2*  PHOS  --   --  7.3*   GFR: Estimated Creatinine Clearance: 8.2 mL/min (A) (by C-G formula based on SCr of 8.65 mg/dL (H)). Liver Function Tests: Recent Labs  Lab 10/24/19 1107 10/26/19 0705  AST 23  --   ALT 14  --   ALKPHOS 55  --   BILITOT 0.8  --   PROT 6.9  --   ALBUMIN 3.3* 2.8*   No results for input(s): LIPASE, AMYLASE in the last 168 hours. No results for input(s): AMMONIA in the last 168 hours. Coagulation Profile: No results for input(s): INR, PROTIME in the last 168 hours. Cardiac Enzymes: No results for input(s): CKTOTAL, CKMB, CKMBINDEX, TROPONINI in the last 168 hours. BNP (last 3 results) No results for input(s): PROBNP in the last 8760 hours. HbA1C: No results for input(s): HGBA1C in the last 72 hours. CBG: Recent Labs  Lab 10/24/19 1012 10/25/19 0121  GLUCAP 70 77   Lipid Profile: No results for input(s): CHOL, HDL, LDLCALC, TRIG, CHOLHDL, LDLDIRECT in the last 72 hours. Thyroid Function Tests: Recent Labs    10/24/19 1544  TSH 0.663   Anemia Panel: No results for input(s): VITAMINB12, FOLATE, FERRITIN, TIBC, IRON, RETICCTPCT in the last 72 hours. Sepsis Labs: No results for input(s): PROCALCITON, LATICACIDVEN in the last 168 hours.  Recent Results (from the past 240 hour(s))   Respiratory Panel by RT PCR (Flu A&B, Covid) - Nasopharyngeal Swab     Status: None   Collection Time: 10/24/19  2:46 PM   Specimen: Nasopharyngeal Swab  Result Value Ref Range Status   SARS Coronavirus 2 by RT PCR NEGATIVE NEGATIVE Final    Comment: (NOTE) SARS-CoV-2 target nucleic acids are NOT DETECTED.  The SARS-CoV-2 RNA is generally detectable in upper respiratoy specimens during the acute phase of infection. The lowest concentration of SARS-CoV-2 viral copies this assay can detect is 131 copies/mL. A negative result does not preclude SARS-Cov-2 infection and should not be used as the sole basis for treatment or other patient management decisions. A negative result may occur with  improper specimen collection/handling, submission of specimen  other than nasopharyngeal swab, presence of viral mutation(s) within the areas targeted by this assay, and inadequate number of viral copies (<131 copies/mL). A negative result must be combined with clinical observations, patient history, and epidemiological information. The expected result is Negative.  Fact Sheet for Patients:  PinkCheek.be  Fact Sheet for Healthcare Providers:  GravelBags.it  This test is no t yet approved or cleared by the [place] FDA and  has been authorized for detection and/or diagnosis of SARS-CoV-2 by FDA under an Emergency Use Authorization (EUA). This EUA will remain  in effect (meaning this test can be used) for the duration of the COVID-19 declaration under Section 564(b)(1) of the Act, 21 U.S.C. section 360bbb-3(b)(1), unless the authorization is terminated or revoked sooner.     Influenza A by PCR NEGATIVE NEGATIVE Final   Influenza B by PCR NEGATIVE NEGATIVE Final    Comment: (NOTE) The Xpert Xpress SARS-CoV-2/FLU/RSV assay is intended as an aid in  the diagnosis of influenza from Nasopharyngeal swab specimens and  should not be used as  a sole basis for treatment. Nasal washings and  aspirates are unacceptable for Xpert Xpress SARS-CoV-2/FLU/RSV  testing.  Fact Sheet for Patients: PinkCheek.be  Fact Sheet for Healthcare Providers: GravelBags.it  This test is not yet approved or cleared by the [place] FDA and  has been authorized for detection and/or diagnosis of SARS-CoV-2 by  FDA under an Emergency Use Authorization (EUA). This EUA will remain  in effect (meaning this test can be used) for the duration of the  Covid-19 declaration under Section 564(b)(1) of the Act, 21  U.S.C. section 360bbb-3(b)(1), unless the authorization is  terminated or revoked. Performed at [facility], [place] [address], [place], [place] [address]          Radiology Studies: No results found.      Scheduled Meds: . allopurinol  100 mg Oral Daily  . amLODipine  5 mg Oral Daily  . apixaban  5 mg Oral BID  . Chlorhexidine Gluconate Cloth  6 each Topical Q0600  . doxercalciferol      . doxercalciferol  3 mcg Intravenous Q T,Th,Sa-HD  . feeding supplement (NEPRO CARB STEADY)  237 mL Oral BID BM  . ferric citrate  210 mg Oral TID WC  . heparin sodium (porcine)      . hydrALAZINE  25 mg Oral TID  . hydrocerin   Topical Daily  . melatonin  3 mg Oral QHS  . multivitamin  1 tablet Oral QHS  . nebivolol  10 mg Oral QHS  . tamsulosin  0.4 mg Oral QHS  . Vitamin D (Ergocalciferol)  50,000 Units Oral Weekly   Continuous Infusions:    LOS: 2 days    Time spent: 36 minutes    Elie Confer, MD [facility] Pager [phone]

## 2019-10-26 NOTE — Progress Notes (Signed)
[facility] Progress Note   Dialysis Orders: TTS NW   4h  85.5kg  2/2.5 bath  Hep2000  AVG  - hect 3 tiw  - venofer 50 qwk  - mircera 150 q 2, last 10/14  Assessment/Plan: 1. AMS - ? Uremic due to missed HD x 2 - no obvious source of infection or acute findings on HD CT - MS clearer seems back to baseline. 2. ESRD - TTS - relatively new start 9/23 K 3.9 Cr 12 >6 post HD - HD Saturday -bicarb stopped 10/22, K 3.4 - using 4 K bath today 3. Anemia - hgb 8.6 > 8.7 trending down from outpatient-continue weekly Fe - mircera due next week  - watch trend 4. Secondary hyperparathyroidism - d/c calcitriol daily - not on at HD - continue  hectorol 3 P 7.310/22 - P generally in goal at dialysis - on 1 Aurixya ac at home - will keep on current dose for now. 5. HTN/volume - CXR neg - net UF 2.8 L Thursday - post HD wt 86.5 -titrate to outpt HD tomorrow- get standing wt  6. Nutrition - alb 3.3 / vitamin/suppl 7. Disp - PT recommends SNF and 24/7 assist for safety and rehab (currently lives alone and sons stop by in evenings. SW involved.  Myriam Jacobson, PA-C [facility] 10/26/2019,8:36 AM  LOS: 2 days   Subjective:   Seen on HD. No complaints.  Objective Vitals:   10/26/19 0655 10/26/19 0700 10/26/19 0730 10/26/19 0800  BP: (!) 137/56 132/61 (!) 111/56 (!) 146/60  Pulse: 66 65 64 67  Resp:      Temp:      TempSrc:      SpO2:      Weight:       Physical Exam General: pleasant and cooperative Heart: RRR Lungs: no rales Abdomen: obese soft NT Extremities: no LE edema Dialysis Access:  Left upper AVGG - + bruit -   Additional Objective Labs: Basic Metabolic Panel: Recent Labs  Lab 10/24/19 1107 10/25/19 0535 10/26/19 0705  NA 142 135 135  K 4.6 3.9 3.4*  CL 100 91* 94*  CO2 18* 23 27  GLUCOSE 75 115* 163*  BUN 102* 33* 45*  CREATININE 12.68* 6.38* 8.65*  CALCIUM 8.3* 8.6* 8.2*  PHOS  --   --  7.3*   Liver Function  Tests: Recent Labs  Lab 10/24/19 1107 10/26/19 0705  AST 23  --   ALT 14  --   ALKPHOS 55  --   BILITOT 0.8  --   PROT 6.9  --   ALBUMIN 3.3* 2.8*   No results for input(s): LIPASE, AMYLASE in the last 168 hours. CBC: Recent Labs  Lab 10/24/19 1107 10/26/19 0704  WBC 9.1 6.4  NEUTROABS 6.6  --   HGB 8.6* 8.7*  HCT 29.7* 27.5*  MCV 97.1 91.1  PLT 286 259   Blood Culture No results found for: SDES, SPECREQUEST, CULT, REPTSTATUS  Cardiac Enzymes: No results for input(s): CKTOTAL, CKMB, CKMBINDEX, TROPONINI in the last 168 hours. CBG: Recent Labs  Lab 10/24/19 1012 10/25/19 0121  GLUCAP 70 77   Iron Studies: No results for input(s): IRON, TIBC, TRANSFERRIN, FERRITIN in the last 72 hours. Lab Results  Component Value Date   INR 1.2 09/30/2019   Studies/Results: CT HEAD WO CONTRAST  Result Date: 10/24/2019 CLINICAL DATA:  Mental status change of unknown cause. EXAM: CT HEAD WITHOUT CONTRAST TECHNIQUE: Contiguous axial images were obtained  from the base of the skull through the vertex without intravenous contrast. COMPARISON:  09/30/2019 FINDINGS: Brain: No evidence of acute infarction, hemorrhage, hydrocephalus, extra-axial collection or mass lesion/mass effect. Confluent chronic small vessel ischemia in the cerebral white matter. Mild cerebral volume loss Vascular: Atherosclerotic calcification. Skull: Normal. Negative for fracture or focal lesion. Sinuses/Orbits: Negative IMPRESSION: 1. No acute finding. 2. Confluent chronic small vessel ischemia in the cerebral white matter. Electronically Signed   By: Monte Fantasia M.D.   On: 10/24/2019 10:40   DG Chest Port 1 View  Result Date: 10/24/2019 CLINICAL DATA:  Altered mental status.  Missed dialysis. EXAM: PORTABLE CHEST 1 VIEW COMPARISON:  09/30/2019 FINDINGS: Cardiomediastinal silhouette is accentuated by low lung volumes and AP portable technique. Low lung volumes. No consolidation. No visible pleural effusions or  pneumothorax. The visualized skeletal structures are unremarkable. Left upper arm vascular stent. IMPRESSION: No acute cardiopulmonary disease. Electronically Signed   By: Margaretha Sheffield MD   On: 10/24/2019 13:09   Medications: . sodium chloride    . sodium chloride    . ferric gluconate (FERRLECIT/NULECIT) IV     . allopurinol  100 mg Oral Daily  . amLODipine  5 mg Oral Daily  . apixaban  5 mg Oral BID  . Chlorhexidine Gluconate Cloth  6 each Topical Q0600  . doxercalciferol  3 mcg Intravenous Q T,Th,Sa-HD  . feeding supplement (NEPRO CARB STEADY)  237 mL Oral BID BM  . ferric citrate  210 mg Oral TID WC  . hydrALAZINE  25 mg Oral TID  . hydrocerin   Topical Daily  . melatonin  3 mg Oral QHS  . multivitamin  1 tablet Oral QHS  . nebivolol  10 mg Oral QHS  . tamsulosin  0.4 mg Oral QHS  . Vitamin D (Ergocalciferol)  50,000 Units Oral Weekly

## 2019-10-27 DIAGNOSIS — R451 Restlessness and agitation: Secondary | ICD-10-CM | POA: Diagnosis not present

## 2019-10-27 LAB — GLUCOSE, CAPILLARY
Glucose-Capillary: 98 mg/dL (ref 70–99)
Glucose-Capillary: 116 mg/dL — ABNORMAL HIGH (ref 70–99)

## 2019-10-27 MED ORDER — HALOPERIDOL LACTATE 5 MG/ML IJ SOLN
INTRAMUSCULAR | Status: AC
  Filled 2019-10-27: qty 1

## 2019-10-27 MED ORDER — OLANZAPINE 5 MG PO TBDP
10.0000 mg | ORAL_TABLET | Freq: Every day | ORAL | Status: DC
  Administered 2019-10-27 – 2019-10-31 (×5): 10 mg via ORAL
  Filled 2019-10-27 (×5): qty 2

## 2019-10-27 MED ORDER — DIVALPROEX SODIUM 125 MG PO CSDR
250.0000 mg | DELAYED_RELEASE_CAPSULE | Freq: Two times a day (BID) | ORAL | Status: DC
  Administered 2019-10-27 – 2019-11-01 (×10): 250 mg via ORAL
  Filled 2019-10-27 (×11): qty 2

## 2019-10-27 MED ORDER — HALOPERIDOL LACTATE 5 MG/ML IJ SOLN
5.0000 mg | Freq: Four times a day (QID) | INTRAMUSCULAR | Status: DC | PRN
  Administered 2019-10-27: 5 mg via INTRAVENOUS

## 2019-10-27 MED ORDER — HALOPERIDOL LACTATE 5 MG/ML IJ SOLN: 5.0000 mg | Freq: Once | INTRAMUSCULAR | Status: AC

## 2019-10-27 MED ORDER — OLANZAPINE 10 MG IM SOLR: 10.0000 mg | Freq: Four times a day (QID) | INTRAMUSCULAR | Status: DC | PRN

## 2019-10-27 NOTE — Progress Notes (Signed)
At approximately 0100, patient awoke from sleep.  He is agitated, verbally abusive to staff.  He is refusing to stay in bed and did not want to be put in the chair.  He states he is leaving but will not tell me where he is going.  Gait is unsteady and hits at staff when we try to steady him.  He is argumentative and will not answer questions relating to orientation.  He indicates he know and it is none of our business.  Security called and came to the floor.  He attempted to leave the unit multiple times and wandered the hallway for approximately 30 minutes.  Dr. Myna Hidalgo was on the floor at the time and was able to witness the patient's behavior and to assess him.  IM Haldol given with no results.  Patient continued to be verbally and physically aggressive and we are unable to redirect him.  Dr. Myna Hidalgo gave the order for bilateral soft wrist restraints and 4 side rails.  Patient placed in restraints.  As the night progressed, the patient refused liquids and refused for [place] to reposition him.  At a little bit after 0600, patient appeared more calm and cooperative.  He wanted to get OOB to chair and cooperated in the transfer.  We explained the criteria to be out of restraints and he was agreeable.  He is not striking at staff, he allowed for [place] to put his telemetry back on, and asked for something to drink.  He continues to be impulsive and gets out of chair without calling for assistance but is no longer verbally or physically aggressive to staff.  He is not attempting to pull his PIV out or take his telemetry leads off.  Will continue bed alarm and chair alarm.  Will continue to monitor patient for his safety.  Earleen Reaper RN

## 2019-10-27 NOTE — Progress Notes (Signed)
[facility] Progress Note   Dialysis Orders: TTS [facility]   4h  85.5kg  2/2.5 bath  Hep2000  AVG  - hect 3 tiw  - venofer 50 qwk  - mircera 150 q 2, last 10/14  Assessment/Plan: 1. AMS - ? Uremic due to missed HD x 2 - no obvious source of infection or acute findings on HD CT - MS clearer seems back to baseline yesterday then agitation early this am Rx with haldol.  Now somewhat sedated.. Admission note indicates hx of insomnia and some cognitive decline. 2. ESRD - TTS - relatively new start 9/23 K 3.9 Cr 12 >6 post HD - HD Saturday -bicarb stopped 10/22, K 3.4 - used 4 K Saturday Next HD 10/26 3. Anemia - hgb 8.6 > 8.7 trending down from outpatient-continue weekly Fe - mircera due next week  - watch trend 4. Secondary hyperparathyroidism - d/c calcitriol daily - not on at HD - continue  hectorol 3 P 7.310/22 - P generally in goal at dialysis - on 1 Aurixya ac at home - will keep on current dose for now. 5. HTN/volume - CXR neg - net UF 2.8 L Thursday - post HD wt 86.5 -titrate to outpt HD tomorrow- get standing wt  6. Nutrition - alb 3.3 / vitamin/suppl 7. Disp - PT recommends SNF and 24/7 assist for safety and rehab (currently lives alone and sons stop by in evenings. SW involved.  Myriam Jacobson, PA-C [place] [phone] 10/27/2019,9:03 AM  LOS: 3 days   Subjective:   Events of agitation early this am noted. Calm at present somewhat sedated. Not answering questions. Ate most of breakfast.  Objective Vitals:   10/26/19 1132 10/26/19 1700 10/26/19 2033 10/27/19 0517  BP: (!) 130/57 136/61 139/67 138/63  Pulse: 70 77 72 68  Resp: 18 18 18  (!) 22  Temp: 98.1 F (36.7 C) 99.2 F (37.3 C) 98.7 F (37.1 C) 98.5 F (36.9 C)  TempSrc: Oral Oral Oral Oral  SpO2: 98% 96% 95% 93%  Weight:   85.9 kg    Physical Exam General: NAD resting comfortably Heart: RRR Lungs: no rales Abdomen: obese soft NT Extremities: no LE edema Dialysis Access:   Left upper AVGG - + bruit -   Additional Objective Labs: Basic Metabolic Panel: Recent Labs  Lab 10/24/19 1107 10/25/19 0535 10/26/19 0705  NA 142 135 135  K 4.6 3.9 3.4*  CL 100 91* 94*  CO2 18* 23 27  GLUCOSE 75 115* 163*  BUN 102* 33* 45*  CREATININE 12.68* 6.38* 8.65*  CALCIUM 8.3* 8.6* 8.2*  PHOS  --   --  7.3*   Liver Function Tests: Recent Labs  Lab 10/24/19 1107 10/26/19 0705  AST 23  --   ALT 14  --   ALKPHOS 55  --   BILITOT 0.8  --   PROT 6.9  --   ALBUMIN 3.3* 2.8*   No results for input(s): LIPASE, AMYLASE in the last 168 hours. CBC: Recent Labs  Lab 10/24/19 1107 10/26/19 0704  WBC 9.1 6.4  NEUTROABS 6.6  --   HGB 8.6* 8.7*  HCT 29.7* 27.5*  MCV 97.1 91.1  PLT 286 259   Blood Culture No results found for: SDES, SPECREQUEST, CULT, REPTSTATUS  Cardiac Enzymes: No results for input(s): CKTOTAL, CKMB, CKMBINDEX, TROPONINI in the last 168 hours. CBG: Recent Labs  Lab 10/24/19 1012 10/25/19 0121 10/26/19 2159 10/27/19 0643  GLUCAP 70 77 93 98  Iron Studies: No results for input(s): IRON, TIBC, TRANSFERRIN, FERRITIN in the last 72 hours. Lab Results  Component Value Date   INR 1.2 09/30/2019   Studies/Results: No results found. Medications:  . allopurinol  100 mg Oral Daily  . amLODipine  5 mg Oral Daily  . apixaban  5 mg Oral BID  . Chlorhexidine Gluconate Cloth  6 each Topical Q0600  . divalproex  250 mg Oral Q12H  . doxercalciferol  3 mcg Intravenous Q T,Th,Sa-HD  . feeding supplement (NEPRO CARB STEADY)  237 mL Oral BID BM  . ferric citrate  210 mg Oral TID WC  . hydrALAZINE  25 mg Oral TID  . hydrocerin   Topical Daily  . melatonin  3 mg Oral QHS  . multivitamin  1 tablet Oral QHS  . nebivolol  10 mg Oral QHS  . OLANZapine zydis  10 mg Oral QHS  . tamsulosin  0.4 mg Oral QHS  . Vitamin D (Ergocalciferol)  50,000 Units Oral Weekly

## 2019-10-27 NOTE — Progress Notes (Signed)
OT Cancellation Note  Patient Details Name: Duane Ortiz MRN: [id number] DOB: [birth year]   Cancelled Treatment:    Reason Eval/Treat Not Completed: Other (comment). Spoke with RN before entering room and she reported pt is acting withdrawn, but that family is in room and maybe he will be acting differently. Pt answering some questions, but most of the answers were "I don't know" and in a monotone voice. Pt laying in bed with his back turned to his son Duane Ortiz (he is the one that made the patient come to the hospital). Duane Ortiz feels his dad is mad at him for his and is treating him the same way. Pt not agreeable to getting up with me. We will re-attempt eval tomorrow.  [place], OTR/L Acute Rehab Services Pager [phone] Office [phone]     Almon Register 10/27/2019, 12:25 PM

## 2019-10-27 NOTE — Progress Notes (Signed)
PROGRESS NOTE    Duane Ortiz  WPY:[id number] DOB: 04-Jun-1950 DOA: 10/24/2019 PCP: Sandi Mariscal, MD    Brief Narrative:  69 year old male with history of ESRD and hemodialysis, hypertension, PE on Eliquis presented with altered mental status, bizarre behavior and wandering in the street.  Patient was recently started on hemodialysis on 9/18.  According to the family, he lives alone, his sons check on him every day.  Apparently he missed the shuttle to go to dialysis on Tuesday.  Patient was wandering around the neighborhood.  Son found him outside the house totally confused so brought to the ER.  Apparently does have history of insomnia and some cognitive dysfunction in the past. In the emergency room he was confused otherwise stable.  Underwent emergent dialysis.  Patient is also having another session of hemodialysis.  Mental status is clear.  He is alert and oriented x3. Reported to have developed acute agitation overnight, likely hospital-acquired delirium.  He received dose of Haldol with resolution of symptoms this morning.   Assessment & Plan:   Active Problems:   AMS (altered mental status) Acute uremic metabolic encephalopathy: Underwent emergent dialysis with resolution of encephalopathy. He is currently alert and oriented x3.  He received another hemodialysis on 10/26/2019 Patient developed acute agitation overnight likely hospital acquired delirium. He received a dose of Haldol. I initiated Zyprexa oral disintegrating tablet of Depakote. Consider developmental change by moving patient was to window with no blind. Start melatonin 6 mg q. nightly No evidence of any infection or CNS process.   Continue to monitor. Patient will surely need evaluation for placement.  ESRD on hemodialysis with uremia: Underwent dialysis in the hospital.  He is HD days are TTS  Followed by nephrology.  Hypertension: Blood pressure stable on home medications.  History of PE: Continue with  Eliquis.  Social: Apparently lives alone with recently worsening cognition, missing hemodialysis.  Family is looking for more supervised care.  According to the patient's son, they are exploring options for him to go to a nursing home in [place].  Patient will need placement at the supervised care/SNF  DVT prophylaxis:  apixaban (ELIQUIS) tablet 5 mg   Code Status: Full code Family Communication: None at bedside Disposition Plan: Possible discharge home tomorrow    Dispo: The patient is from: Home              Anticipated d/c is to: SNF               Anticipated d/c date is: More than 2 nights               Patient currently is not medically stable to d/c.         Consultants:   Nephrology  Procedures:   Dialysis  Antimicrobials:   None   Subjective:  Patient was seen and evaluated at bedside.  He is very sleepy but arousable.  Patient was reported with acute agitation and swinging at staff overnight.  He was unable to be redirected.  He finally received a dose of Haldol by the night physician and became calm during the morning and sleepy.  Likely hospital-acquired delirium. We will continue patient on oral Zyprexa and Depakote. Environmental modification-room without blind and close to nursing station. Nephrology following with hemodialysis.     Objective: Vitals:   10/26/19 1700 10/26/19 2033 10/27/19 0517 10/27/19 0910  BP: 136/61 139/67 138/63 (!) 136/55  Pulse: 77 72 68 66  Resp: 18 18 (!) 22 18  Temp: 99.2 F (37.3 C) 98.7 F (37.1 C) 98.5 F (36.9 C) 98.4 F (36.9 C)  TempSrc: Oral Oral Oral   SpO2: 96% 95% 93% 97%  Weight:  85.9 kg      Intake/Output Summary (Last 24 hours) at 10/27/2019 1459 Last data filed at 10/27/2019 1015 Gross per 24 hour  Intake 480 ml  Output 0 ml  Net 480 ml   Filed Weights   10/26/19 0645 10/26/19 1055 10/26/19 2033  Weight: 87.2 kg 85.2 kg 85.9 kg    Examination:  General exam: He is sleepy but  arousable.  He is not in acute distress. Left upper extremity AV fistula with thrill. Respiratory system: Clear to auscultation. Respiratory effort normal.   Cardiovascular system: S1 & S2 heard, RRR.  Gastrointestinal system: Abdomen is nondistended, soft and nontender. No organomegaly or masses felt. Normal bowel sounds heard. Central nervous system: Alert and oriented x3. No focal neurological deficits. Extremities: Symmetric 5 x 5 power. Skin: No rashes, lesions or ulcers Psychiatry: Judgement and insight appear normal. Mood & affect appropriate.     Data Reviewed: I have personally reviewed following labs and imaging studies  CBC: Recent Labs  Lab 10/24/19 1107 10/26/19 0704  WBC 9.1 6.4  NEUTROABS 6.6  --   HGB 8.6* 8.7*  HCT 29.7* 27.5*  MCV 97.1 91.1  PLT 286 326   Basic Metabolic Panel: Recent Labs  Lab 10/24/19 1107 10/25/19 0535 10/26/19 0705  NA 142 135 135  K 4.6 3.9 3.4*  CL 100 91* 94*  CO2 18* 23 27  GLUCOSE 75 115* 163*  BUN 102* 33* 45*  CREATININE 12.68* 6.38* 8.65*  CALCIUM 8.3* 8.6* 8.2*  PHOS  --   --  7.3*   GFR: Estimated Creatinine Clearance: 8.2 mL/min (A) (by C-G formula based on SCr of 8.65 mg/dL (H)). Liver Function Tests: Recent Labs  Lab 10/24/19 1107 10/26/19 0705  AST 23  --   ALT 14  --   ALKPHOS 55  --   BILITOT 0.8  --   PROT 6.9  --   ALBUMIN 3.3* 2.8*   No results for input(s): LIPASE, AMYLASE in the last 168 hours. No results for input(s): AMMONIA in the last 168 hours. Coagulation Profile: No results for input(s): INR, PROTIME in the last 168 hours. Cardiac Enzymes: No results for input(s): CKTOTAL, CKMB, CKMBINDEX, TROPONINI in the last 168 hours. BNP (last 3 results) No results for input(s): PROBNP in the last 8760 hours. HbA1C: No results for input(s): HGBA1C in the last 72 hours. CBG: Recent Labs  Lab 10/24/19 1012 10/25/19 0121 10/26/19 2159 10/27/19 0643 10/27/19 1225  GLUCAP 70 77 93 98 116*    Lipid Profile: No results for input(s): CHOL, HDL, LDLCALC, TRIG, CHOLHDL, LDLDIRECT in the last 72 hours. Thyroid Function Tests: Recent Labs    10/24/19 1544  TSH 0.663   Anemia Panel: No results for input(s): VITAMINB12, FOLATE, FERRITIN, TIBC, IRON, RETICCTPCT in the last 72 hours. Sepsis Labs: No results for input(s): PROCALCITON, LATICACIDVEN in the last 168 hours.  Recent Results (from the past 240 hour(s))  Respiratory Panel by RT PCR (Flu A&B, Covid) - Nasopharyngeal Swab     Status: None   Collection Time: 10/24/19  2:46 PM   Specimen: Nasopharyngeal Swab  Result Value Ref Range Status   SARS Coronavirus 2 by RT PCR NEGATIVE NEGATIVE Final    Comment: (NOTE) SARS-CoV-2 target nucleic acids are NOT DETECTED.  The SARS-CoV-2 RNA is generally  detectable in upper respiratoy specimens during the acute phase of infection. The lowest concentration of SARS-CoV-2 viral copies this assay can detect is 131 copies/mL. A negative result does not preclude SARS-Cov-2 infection and should not be used as the sole basis for treatment or other patient management decisions. A negative result may occur with  improper specimen collection/handling, submission of specimen other than nasopharyngeal swab, presence of viral mutation(s) within the areas targeted by this assay, and inadequate number of viral copies (<131 copies/mL). A negative result must be combined with clinical observations, patient history, and epidemiological information. The expected result is Negative.  Fact Sheet for Patients:  PinkCheek.be  Fact Sheet for Healthcare Providers:  GravelBags.it  This test is no t yet approved or cleared by the [place] FDA and  has been authorized for detection and/or diagnosis of SARS-CoV-2 by FDA under an Emergency Use Authorization (EUA). This EUA will remain  in effect (meaning this test can be used) for the duration  of the COVID-19 declaration under Section 564(b)(1) of the Act, 21 U.S.C. section 360bbb-3(b)(1), unless the authorization is terminated or revoked sooner.     Influenza A by PCR NEGATIVE NEGATIVE Final   Influenza B by PCR NEGATIVE NEGATIVE Final    Comment: (NOTE) The Xpert Xpress SARS-CoV-2/FLU/RSV assay is intended as an aid in  the diagnosis of influenza from Nasopharyngeal swab specimens and  should not be used as a sole basis for treatment. Nasal washings and  aspirates are unacceptable for Xpert Xpress SARS-CoV-2/FLU/RSV  testing.  Fact Sheet for Patients: PinkCheek.be  Fact Sheet for Healthcare Providers: GravelBags.it  This test is not yet approved or cleared by the [place] FDA and  has been authorized for detection and/or diagnosis of SARS-CoV-2 by  FDA under an Emergency Use Authorization (EUA). This EUA will remain  in effect (meaning this test can be used) for the duration of the  Covid-19 declaration under Section 564(b)(1) of the Act, 21  U.S.C. section 360bbb-3(b)(1), unless the authorization is  terminated or revoked. Performed at [facility], [place] [address], [place], [place] [address]          Radiology Studies: No results found.      Scheduled Meds: . allopurinol  100 mg Oral Daily  . amLODipine  5 mg Oral Daily  . apixaban  5 mg Oral BID  . Chlorhexidine Gluconate Cloth  6 each Topical Q0600  . divalproex  250 mg Oral Q12H  . doxercalciferol  3 mcg Intravenous Q T,Th,Sa-HD  . feeding supplement (NEPRO CARB STEADY)  237 mL Oral BID BM  . ferric citrate  210 mg Oral TID WC  . hydrALAZINE  25 mg Oral TID  . hydrocerin   Topical Daily  . melatonin  3 mg Oral QHS  . multivitamin  1 tablet Oral QHS  . nebivolol  10 mg Oral QHS  . OLANZapine zydis  10 mg Oral QHS  . tamsulosin  0.4 mg Oral QHS  . Vitamin D (Ergocalciferol)  50,000 Units Oral Weekly   Continuous  Infusions:    LOS: 3 days    Time spent: 33 minutes    Elie Confer, MD [facility] Pager [phone]

## 2019-10-27 NOTE — Progress Notes (Signed)
Patient is agitated, wandering halls, threatening staff, unable to be redirected and unable to voice any specific concern or complaint. Security is with him now. He is posing a real danger to himself and staff. Plan to give him some Haldol now.

## 2019-10-28 DIAGNOSIS — I10 Essential (primary) hypertension: Secondary | ICD-10-CM

## 2019-10-28 DIAGNOSIS — G9349 Other encephalopathy: Secondary | ICD-10-CM

## 2019-10-28 DIAGNOSIS — N19 Unspecified kidney failure: Secondary | ICD-10-CM

## 2019-10-28 DIAGNOSIS — N186 End stage renal disease: Secondary | ICD-10-CM | POA: Diagnosis not present

## 2019-10-28 DIAGNOSIS — Z86711 Personal history of pulmonary embolism: Secondary | ICD-10-CM

## 2019-10-28 DIAGNOSIS — R451 Restlessness and agitation: Secondary | ICD-10-CM | POA: Diagnosis not present

## 2019-10-28 LAB — AMMONIA: Ammonia: 35 umol/L (ref 9–35)

## 2019-10-28 LAB — CBC
HCT: 29.4 % — ABNORMAL LOW (ref 39.0–52.0)
Hemoglobin: 8.8 g/dL — ABNORMAL LOW (ref 13.0–17.0)
MCH: 28.5 pg (ref 26.0–34.0)
MCHC: 29.9 g/dL — ABNORMAL LOW (ref 30.0–36.0)
MCV: 95.1 fL (ref 80.0–100.0)
Platelets: 254 10*3/uL (ref 150–400)
RBC: 3.09 MIL/uL — ABNORMAL LOW (ref 4.22–5.81)
RDW: 14.8 % (ref 11.5–15.5)
WBC: 6.6 10*3/uL (ref 4.0–10.5)
nRBC: 0 % (ref 0.0–0.2)

## 2019-10-28 LAB — COMPREHENSIVE METABOLIC PANEL
ALT: 12 U/L (ref 0–44)
AST: 19 U/L (ref 15–41)
Albumin: 2.9 g/dL — ABNORMAL LOW (ref 3.5–5.0)
Alkaline Phosphatase: 52 U/L (ref 38–126)
Anion gap: 13 (ref 5–15)
BUN: 42 mg/dL — ABNORMAL HIGH (ref 8–23)
CO2: 26 mmol/L (ref 22–32)
Calcium: 8.6 mg/dL — ABNORMAL LOW (ref 8.9–10.3)
Chloride: 100 mmol/L (ref 98–111)
Creatinine, Ser: 8.3 mg/dL — ABNORMAL HIGH (ref 0.61–1.24)
GFR, Estimated: 6 mL/min — ABNORMAL LOW (ref >60)
Glucose, Bld: 94 mg/dL (ref 70–99)
Potassium: 3.7 mmol/L (ref 3.5–5.1)
Sodium: 139 mmol/L (ref 135–145)
Total Bilirubin: 0.6 mg/dL (ref 0.3–1.2)
Total Protein: 6.4 g/dL — ABNORMAL LOW (ref 6.5–8.1)

## 2019-10-28 LAB — HEPATITIS B SURFACE ANTIBODY, QUANTITATIVE: Hep B S AB Quant (Post): <3.1 m[IU]/mL — ABNORMAL LOW (ref >9.9)

## 2019-10-28 LAB — PHOSPHORUS: Phosphorus: 5.8 mg/dL — ABNORMAL HIGH (ref 2.5–4.6)

## 2019-10-28 LAB — MAGNESIUM: Magnesium: 2.1 mg/dL (ref 1.7–2.4)

## 2019-10-28 NOTE — Evaluation (Signed)
Occupational Therapy Evaluation Patient Details Name: Duane Ortiz MRN: [id number] DOB: [birth year] Today's Date: 10/28/2019    History of Present Illness 69yo male presenting wtih AMS, also recently missed an HD session and found wandering around neighborhood. Admitted with acute metabolic encephalopathy. PMH CKD, DM with peripheral neuropathy, gait abnormality, gout, HTN, hx PE, cervical surgery, hx wrist surgery   Clinical Impression   PTA, pt was living at home alone, he reports he was independent with ADL/IADL and modified independent with functional mobility at RW level. Pt currently requires minA for functional mobility at RW level, he demonstrates cognitive limitations impacting his safety with ADL completion and functional mobility. Pt's physical limitations and cognitive limitations but him at an increased safety risk during cooking, medication management, shower transfers, and functional mobility. Due to decline in current level of function, pt would benefit from acute OT to address established goals to facilitate safe D/C to venue listed below. At this time, recommend SNF follow-up. Will continue to follow acutely.     Follow Up Recommendations  SNF;Supervision/Assistance - 24 hour    Equipment Recommendations  3 in 1 bedside commode    Recommendations for Other Services       Precautions / Restrictions Precautions Precautions: Fall;Other (comment) Precaution Comments: easily agitated when cues are given Restrictions Weight Bearing Restrictions: No      Mobility Bed Mobility Overal bed mobility: Needs Assistance Bed Mobility: Supine to Sit;Sit to Supine     Supine to sit: Min guard;HOB elevated Sit to supine: Min guard;HOB elevated   General bed mobility comments: min guard/extended time and increased effort    Transfers Overall transfer level: Needs assistance Equipment used: None Transfers: Sit to/from [facility] Sit to Stand: From  elevated surface;Min assist Stand pivot transfers: Min assist       General transfer comment: minA to progress into standing from EOB with slightly elevated surface;required assistance for safe use of RW    Balance Overall balance assessment: Needs assistance Sitting-balance support: No upper extremity supported;Feet supported Sitting balance-Leahy Scale: Good     Standing balance support: Bilateral upper extremity supported Standing balance-Leahy Scale: Poor Standing balance comment: reliant on BUE support in standing                           ADL either performed or assessed with clinical judgement   ADL Overall ADL's : Needs assistance/impaired Eating/Feeding: Set up;Sitting   Grooming: Minimal assistance;Standing   Upper Body Bathing: Set up;Sitting   Lower Body Bathing: Sit to/from stand;Minimal assistance   Upper Body Dressing : Set up;Sitting   Lower Body Dressing: Sit to/from stand;Minimal assistance Lower Body Dressing Details (indicate cue type and reason): pt able to figure-4 to don/doff LB clothing Toilet Transfer: RW;Ambulation;Minimal assistance Toilet Transfer Details (indicate cue type and reason): short distance in room Toileting- Clothing Manipulation and Hygiene: Minimal assistance   Tub/ Shower Transfer: Moderate assistance Tub/Shower Transfer Details (indicate cue type and reason): modA for safety with stepping over ledge, pt's threshold at hom about 3x size of one in room Functional mobility during ADLs: Rolling walker;Minimal assistance General ADL Comments: pt limited by decreased strength/stability decreased activity tolerance, and cognitive limitations     Vision         Perception     Praxis      Pertinent Vitals/Pain Pain Assessment: No/denies pain Pain Intervention(s): Monitored during session     Hand Dominance Right   Extremity/Trunk Assessment Upper  Extremity Assessment Upper Extremity Assessment: Generalized  weakness   Lower Extremity Assessment Lower Extremity Assessment: Generalized weakness   Cervical / Trunk Assessment Cervical / Trunk Assessment: Kyphotic   Communication Communication Communication: No difficulties   Cognition Arousal/Alertness: Awake/alert Behavior During Therapy: Flat affect Overall Cognitive Status: No family/caregiver present to determine baseline cognitive functioning Area of Impairment: Orientation;Following commands;Safety/judgement;Problem solving;Attention;Memory;Awareness                 Orientation Level: Disoriented to;Place;Situation Current Attention Level: Sustained Memory: Decreased short-term memory Following Commands: Follows one step commands with increased time;Follows one step commands consistently Safety/Judgement: Decreased awareness of safety;Decreased awareness of deficits Awareness: Intellectual Problem Solving: Slow processing;Decreased initiation;Difficulty sequencing;Requires verbal cues;Requires tactile cues General Comments: Pt scored 24/28 on Short Blessed Test, which is a screening tool to detect early cognitive changes associated with Alzheimer's disease. Scores in the impaired range, indicate further assessement. Pt's score of a 24 indicates "impairment consistent with dementia"pt with decreased awareness of deficits, decreased awareness of safety and decreased awareness of need for assistance   General Comments       Exercises     Shoulder Instructions      Home Living Family/patient expects to be discharged to:: Private residence Living Arrangements: Alone;Other (Comment) Available Help at Discharge: Family;Available PRN/intermittently Type of Home: Apartment Home Access: Elevator     Home Layout: One level     Bathroom Shower/Tub: Teacher, early years/pre: Standard Bathroom Accessibility: Yes How Accessible: Accessible via walker Home Equipment: Cane - single point;Walker - 2 wheels           Prior Functioning/Environment Level of Independence: Independent with assistive device(s)        Comments: 1 fall in past 6 months        OT Problem List: Decreased strength;Decreased activity tolerance;Impaired balance (sitting and/or standing);Decreased cognition;Decreased safety awareness;Decreased knowledge of use of DME or AE;Decreased knowledge of precautions      OT Treatment/Interventions: Self-care/ADL training;Therapeutic exercise;Energy conservation;DME and/or AE instruction;Therapeutic activities;Patient/family education;Cognitive remediation/compensation    OT Goals(Current goals can be found in the care plan section) Acute Rehab OT Goals Patient Stated Goal: return to independence OT Goal Formulation: With patient Time For Goal Achievement: 11/11/19 Potential to Achieve Goals: Fair ADL Goals Pt Will Perform Grooming: with modified independence;standing Pt Will Perform Lower Body Dressing: with modified independence;sit to/from stand Pt Will Transfer to Toilet: with modified independence;ambulating Additional ADL Goal #1: Pt will demonstrate anticipatory awareness for safe engagement of ADL in his environment.  OT Frequency: Min 2X/week   Barriers to D/C:            Co-evaluation              AM-PAC OT "6 Clicks" Daily Activity     Outcome Measure Help from another person eating meals?: A Little Help from another person taking care of personal grooming?: A Little Help from another person toileting, which includes using toliet, bedpan, or urinal?: A Little Help from another person bathing (including washing, rinsing, drying)?: A Little Help from another person to put on and taking off regular upper body clothing?: A Little Help from another person to put on and taking off regular lower body clothing?: A Little 6 Click Score: 18   End of Session Equipment Utilized During Treatment: Gait belt;Rolling walker Nurse Communication: Mobility status  Activity  Tolerance: Patient tolerated treatment well Patient left: in bed;with call bell/phone within reach;with bed alarm set  OT Visit Diagnosis: Other  abnormalities of gait and mobility (R26.89);Muscle weakness (generalized) (M62.81);Other symptoms and signs involving cognitive function                Time: 1440-1459 OT Time Calculation (min): 19 min Charges:  OT General Charges $OT Visit: 1 Visit OT Evaluation $OT Eval Moderate Complexity: [place] OTR/L Acute Rehabilitation Services Office: [place] Part 10/28/2019, 4:16 PM

## 2019-10-28 NOTE — Progress Notes (Signed)
  [facility] Progress Note   Subjective:  Seen in room, A&O x 3 today, calm. Denies CP/dyspnea. SNF placement pending per notes.  Objective Vitals:   10/27/19 0910 10/27/19 1712 10/27/19 2021 10/28/19 0855  BP: (!) 136/55 (!) 151/62 (!) 160/75 (!) 127/58  Pulse: 66 87 70 73  Resp: 18 16 17 18   Temp: 98.4 F (36.9 C) 98.6 F (37 C) 98.3 F (36.8 C) 97.7 F (36.5 C)  TempSrc:  Oral Oral   SpO2: 97% 97% 97% 100%  Weight:       Physical Exam General: Well appearing man, NAD Heart: RRR; no murmur Lungs: CTA anteriorly Abdomen: soft, non-tender Extremities: No LE edema Dialysis Access: AVG + bruit  Additional Objective Labs: Basic Metabolic Panel: Recent Labs  Lab 10/25/19 0535 10/26/19 0705 10/28/19 0239  NA 135 135 139  K 3.9 3.4* 3.7  CL 91* 94* 100  CO2 23 27 26   GLUCOSE 115* 163* 94  BUN 33* 45* 42*  CREATININE 6.38* 8.65* 8.30*  CALCIUM 8.6* 8.2* 8.6*  PHOS  --  7.3* 5.8*   Liver Function Tests: Recent Labs  Lab 10/24/19 1107 10/26/19 0705 10/28/19 0239  AST 23  --  19  ALT 14  --  12  ALKPHOS 55  --  52  BILITOT 0.8  --  0.6  PROT 6.9  --  6.4*  ALBUMIN 3.3* 2.8* 2.9*   CBC: Recent Labs  Lab 10/24/19 1107 10/26/19 0704 10/28/19 0239  WBC 9.1 6.4 6.6  NEUTROABS 6.6  --   --   HGB 8.6* 8.7* 8.8*  HCT 29.7* 27.5* 29.4*  MCV 97.1 91.1 95.1  PLT 286 259 254   Medications:  . allopurinol  100 mg Oral Daily  . amLODipine  5 mg Oral Daily  . apixaban  5 mg Oral BID  . Chlorhexidine Gluconate Cloth  6 each Topical Q0600  . divalproex  250 mg Oral Q12H  . doxercalciferol  3 mcg Intravenous Q T,Th,Sa-HD  . feeding supplement (NEPRO CARB STEADY)  237 mL Oral BID BM  . ferric citrate  210 mg Oral TID WC  . hydrALAZINE  25 mg Oral TID  . hydrocerin   Topical Daily  . melatonin  3 mg Oral QHS  . multivitamin  1 tablet Oral QHS  . nebivolol  10 mg Oral QHS  . OLANZapine zydis  10 mg Oral QHS  . tamsulosin  0.4 mg Oral QHS  .  Vitamin D (Ergocalciferol)  50,000 Units Oral Weekly    Dialysis Orders: [facility] - relatively new ESRD, started HD 09/26/19 4h 85.5kg 2/2.5 bath Hep2000 AVG - hect 3 tiw - venofer 50 qwk - mircera 150 q 2, last 10/14  Assessment/Plan: 1. AMS: Initially felt possibly d/t uremia with missed HD. Head CT negative, no obvious infections. Possibly just worsening dementia. Overall MS is clearer but with prn delirium. Placement pending. 2. ESRD: Continue HD per TTS schedule - next HD tomorrow (10/26). 3. Anemia of ESRD: Hgb 8.8 - slow improvement. Will be due for Mircera later this week. 4. Secondary hyperparathyroidism: Ca ok, Phos improving. Continue hectorol + Auryxia. 5. HTN/volume: BP variable, CXR without edema. Keeping same EDW for now.   6. Nutrition: Alb low, continue Nepro. 7. Dispo: PT recommends SNF and 24/7 assist for safety and rehab (currently lives alone and sons stop by in evenings. SW involved.  Veneta Penton, PA-C 10/28/2019, 9:03 AM  [employer]

## 2019-10-28 NOTE — Care Management Important Message (Signed)
Important Message  Patient Details  Name: Duane Ortiz MRN: [id number] Date of Birth: 11-15-1950   Medicare Important Message Given:  Yes - Important Message mailed due to current National Emergency  Verbal consent obtained due to current National Emergency  Relationship to patient: Self Contact Name: Julyan Gales Call Date: 10/28/19  Time: 1033 Phone: [phone] Outcome: No Answer/Busy Important Message mailed to: Patient address on file    Delorse Lek 10/28/2019, 10:33 AM

## 2019-10-28 NOTE — Plan of Care (Signed)
  Problem: Nutrition: Goal: Adequate nutrition will be maintained Outcome: Progressing   

## 2019-10-28 NOTE — Progress Notes (Signed)
Physical Therapy Treatment Patient Details Name: Duane Ortiz MRN: [id number] DOB: Mar 19, 1950 Today's Date: 10/28/2019    History of Present Illness 69yo male presenting wtih AMS, also recently missed an HD session and found wandering around neighborhood. Admitted with acute metabolic encephalopathy. PMH CKD, DM with peripheral neuropathy, gait abnormality, gout, HTN, hx PE, cervical surgery, hx wrist surgery    PT Comments    Patient received in bed, very flat and quiet today but somewhat cooperative with therapy. Agreeable to get OOB to chair to eat lunch with coaxing, repeatedly declined gait today however. Continues to require ModA and multiple attempts to get to full upright, then MinA to take pivotal steps over to recliner with flexed trunk and wide BOS. Grossly unsteady when OOB. Left up in recliner with all needs met, chair alarm active. Continue to recommend SNF and 24/7A.     Follow Up Recommendations  SNF;Supervision/Assistance - 24 hour;Other (comment) (if he refuses, will need HHPT and 24/7A)     Equipment Recommendations  Rolling walker with 5" wheels;3in1 (PT)    Recommendations for Other Services       Precautions / Restrictions Precautions Precautions: Fall;Other (comment) Precaution Comments: easily agitated when cues are given Restrictions Weight Bearing Restrictions: No    Mobility  Bed Mobility Overal bed mobility: Needs Assistance Bed Mobility: Supine to Sit     Supine to sit: Min guard;HOB elevated     General bed mobility comments: min guard/extended time and increased effort  Transfers Overall transfer level: Needs assistance Equipment used: None Transfers: Sit to/from [facility] Sit to Stand: Mod assist Stand pivot transfers: Min assist       General transfer comment: heavy modA with rocking and multiple attempts to boost all the way up to standing and get balance, once up able to take pivotal steps to recliner with MinA  for balance  Ambulation/Gait             General Gait Details: refused   Stairs             Wheelchair Mobility    Modified Rankin (Stroke Patients Only)       Balance Overall balance assessment: Needs assistance Sitting-balance support: No upper extremity supported;Feet supported Sitting balance-Leahy Scale: Good     Standing balance support: Bilateral upper extremity supported Standing balance-Leahy Scale: Poor Standing balance comment: benefits from BUE support, wide BOS and unsteady without BUE support                            Cognition Arousal/Alertness: Awake/alert Behavior During Therapy: Flat affect Overall Cognitive Status: Impaired/Different from baseline                     Current Attention Level: Sustained Memory: Decreased short-term memory Following Commands: Follows one step commands with increased time;Follows one step commands consistently Safety/Judgement: Decreased awareness of safety;Decreased awareness of deficits Awareness: Intellectual Problem Solving: Slow processing;Decreased initiation;Difficulty sequencing;Requires verbal cues;Requires tactile cues General Comments: more flat and very quiet/withdrawn today, but somewhat cooperative with therapy, no agitation noted      Exercises      General Comments        Pertinent Vitals/Pain Pain Assessment: Faces Pain Score: 0-No pain Faces Pain Scale: No hurt Pain Intervention(s): Limited activity within patient's tolerance;Monitored during session;Repositioned    Home Living  Prior Function            PT Goals (current goals can now be found in the care plan section) Acute Rehab PT Goals Patient Stated Goal: return to independence PT Goal Formulation: Patient unable to participate in goal setting Time For Goal Achievement: 11/08/19 Potential to Achieve Goals: Fair Progress towards PT goals: Progressing toward goals  (slowly)    Frequency    Min 2X/week      PT Plan Frequency needs to be updated    Co-evaluation              AM-PAC PT "6 Clicks" Mobility   Outcome Measure  Help needed turning from your back to your side while in a flat bed without using bedrails?: None Help needed moving from lying on your back to sitting on the side of a flat bed without using bedrails?: A Little Help needed moving to and from a bed to a chair (including a wheelchair)?: A Little Help needed standing up from a chair using your arms (e.g., wheelchair or bedside chair)?: A Lot Help needed to walk in hospital room?: A Lot Help needed climbing 3-5 steps with a railing? : A Lot 6 Click Score: 16    End of Session Equipment Utilized During Treatment: Gait belt Activity Tolerance: Patient tolerated treatment well Patient left: with call bell/phone within reach;in chair;with chair alarm set Nurse Communication: Mobility status PT Visit Diagnosis: Other abnormalities of gait and mobility (R26.89);Unsteadiness on feet (R26.81)     Time: 4037-0964 PT Time Calculation (min) (ACUTE ONLY): 10 min  Charges:  $Therapeutic Activity: 8-22 mins                     Windell Norfolk, DPT, PN1   Supplemental Physical Therapist [place]    Pager [phone] Acute Rehab Office [phone]

## 2019-10-28 NOTE — Progress Notes (Signed)
Patient ID: Duane Ortiz, male   DOB: [birth year], 69 y.o.   MRN: [id number]  PROGRESS NOTE    Duane Ortiz  WLS:[id number] DOB: 24-Oct-1950 DOA: 10/24/2019 PCP: Sandi Mariscal, MD   Brief Narrative:  69 year old male with history of ESRD and hemodialysis, hypertension, PE on Eliquis presented with altered mental status, bizarre behavior and wandering in the street.  Patient was recently started on hemodialysis on 9/18.  According to the family, he lives alone, his sons check on him every day.  Apparently he missed the shuttle to go to dialysis on Tuesday.  Patient was wandering around the neighborhood.  Son found him outside the house totally confused so brought to the ER.  Apparently does have history of insomnia and some cognitive dysfunction in the past. In the emergency room he was confused otherwise stable.  Underwent emergent dialysis.  Subsequently mental status improved. He became agitated overnight of 10/26/2019/early morning of 10/27/2019 for which he required restraints.  PT recommended SNF placement.  Assessment & Plan:   Acute uremic encephalopathy Agitation/delirium Probable underlying undiagnosed dementia -Patient presented with altered mental status probably due to acute uremic encephalopathy requiring emergent hemodialysis and resolution of symptoms -Subsequently developed agitation  overnight of 10/26/2019/early morning of 10/27/2019 for which he required restraints and Haldol.  Subsequently restraints have been removed. -Patient has been started on Zyprexa and Depakote. -No evidence of CNS infection.  CT of the brain was negative for acute intracranial abnormality. -Patient probably has undiagnosed dementia.  Might need outpatient neurology evaluation -Monitor mental status.  Fall precautions. -PT recommends SNF placement.  Social worker consult  End-stage renal disease on hemodialysis -Nephrology following.  Dialysis as per nephrology schedule  Anemia of chronic  disease -From renal failure.  Hemoglobin stable.  Monitor intermittently  Hypertension -Continue amlodipine, hydralazine, nebivolol and tamsulosin  History of PE -Continue Eliquis  Social: - Apparently lives alone with recently worsening cognition, missing hemodialysis.  Family is looking for more supervised care.  According to the patient's son, they are exploring options for him to go to a nursing home in [place]. -Social worker consulted for placement  Generalized deconditioning -Palliative care consult for goals of care discussion   DVT prophylaxis: Eliquis Code Status: Full Family Communication: None at bedside Disposition Plan: Status is: Inpatient  Remains inpatient appropriate because:Inpatient level of care appropriate due to severity of illness   Dispo:  Patient From: Home  Planned Disposition: [place]  Expected discharge date: 10/28/2019  medically stable for discharge: Yes  Consultants: Nephrology  Procedures: None  Antimicrobials: None   Subjective: Patient seen and examined at bedside.  He is awake but confused to time.  Poor historian.  No overnight fever, vomiting, worsening agitation reported by nursing staff.  Objective: Vitals:   10/27/19 0910 10/27/19 1712 10/27/19 2021 10/28/19 0855  BP: (!) 136/55 (!) 151/62 (!) 160/75 (!) 127/58  Pulse: 66 87 70 73  Resp: 18 16 17 18   Temp: 98.4 F (36.9 C) 98.6 F (37 C) 98.3 F (36.8 C) 97.7 F (36.5 C)  TempSrc:  Oral Oral   SpO2: 97% 97% 97% 100%  Weight:        Intake/Output Summary (Last 24 hours) at 10/28/2019 0932 Last data filed at 10/28/2019 0800 Gross per 24 hour  Intake 1080 ml  Output 1 ml  Net 1079 ml   Filed Weights   10/26/19 0645 10/26/19 1055 10/26/19 2033  Weight: 87.2 kg 85.2 kg 85.9 kg    Examination:  General exam: Appears calm and comfortable.  Looks chronically ill.  Awake but confused to time Respiratory system: Bilateral decreased breath  sounds at bases with some scattered crackles Cardiovascular system: S1 & S2 heard, Rate controlled Gastrointestinal system: Abdomen is nondistended, soft and nontender. Normal bowel sounds heard. Extremities: No cyanosis, clubbing; trace lower extremity edema present   Data Reviewed: I have personally reviewed following labs and imaging studies  CBC: Recent Labs  Lab 10/24/19 1107 10/26/19 0704 10/28/19 0239  WBC 9.1 6.4 6.6  NEUTROABS 6.6  --   --   HGB 8.6* 8.7* 8.8*  HCT 29.7* 27.5* 29.4*  MCV 97.1 91.1 95.1  PLT 286 259 967   Basic Metabolic Panel: Recent Labs  Lab 10/24/19 1107 10/25/19 0535 10/26/19 0705 10/28/19 0239  NA 142 135 135 139  K 4.6 3.9 3.4* 3.7  CL 100 91* 94* 100  CO2 18* 23 27 26   GLUCOSE 75 115* 163* 94  BUN 102* 33* 45* 42*  CREATININE 12.68* 6.38* 8.65* 8.30*  CALCIUM 8.3* 8.6* 8.2* 8.6*  MG  --   --   --  2.1  PHOS  --   --  7.3* 5.8*   GFR: Estimated Creatinine Clearance: 8.5 mL/min (A) (by C-G formula based on SCr of 8.3 mg/dL (H)). Liver Function Tests: Recent Labs  Lab 10/24/19 1107 10/26/19 0705 10/28/19 0239  AST 23  --  19  ALT 14  --  12  ALKPHOS 55  --  52  BILITOT 0.8  --  0.6  PROT 6.9  --  6.4*  ALBUMIN 3.3* 2.8* 2.9*   No results for input(s): LIPASE, AMYLASE in the last 168 hours. Recent Labs  Lab 10/28/19 0239  AMMONIA 35   Coagulation Profile: No results for input(s): INR, PROTIME in the last 168 hours. Cardiac Enzymes: No results for input(s): CKTOTAL, CKMB, CKMBINDEX, TROPONINI in the last 168 hours. BNP (last 3 results) No results for input(s): PROBNP in the last 8760 hours. HbA1C: No results for input(s): HGBA1C in the last 72 hours. CBG: Recent Labs  Lab 10/24/19 1012 10/25/19 0121 10/26/19 2159 10/27/19 0643 10/27/19 1225  GLUCAP 70 77 93 98 116*   Lipid Profile: No results for input(s): CHOL, HDL, LDLCALC, TRIG, CHOLHDL, LDLDIRECT in the last 72 hours. Thyroid Function Tests: No results  for input(s): TSH, T4TOTAL, FREET4, T3FREE, THYROIDAB in the last 72 hours. Anemia Panel: No results for input(s): VITAMINB12, FOLATE, FERRITIN, TIBC, IRON, RETICCTPCT in the last 72 hours. Sepsis Labs: No results for input(s): PROCALCITON, LATICACIDVEN in the last 168 hours.  Recent Results (from the past 240 hour(s))  Respiratory Panel by RT PCR (Flu A&B, Covid) - Nasopharyngeal Swab     Status: None   Collection Time: 10/24/19  2:46 PM   Specimen: Nasopharyngeal Swab  Result Value Ref Range Status   SARS Coronavirus 2 by RT PCR NEGATIVE NEGATIVE Final    Comment: (NOTE) SARS-CoV-2 target nucleic acids are NOT DETECTED.  The SARS-CoV-2 RNA is generally detectable in upper respiratoy specimens during the acute phase of infection. The lowest concentration of SARS-CoV-2 viral copies this assay can detect is 131 copies/mL. A negative result does not preclude SARS-Cov-2 infection and should not be used as the sole basis for treatment or other patient management decisions. A negative result may occur with  improper specimen collection/handling, submission of specimen other than nasopharyngeal swab, presence of viral mutation(s) within the areas targeted by this assay, and inadequate number of viral copies (<131  copies/mL). A negative result must be combined with clinical observations, patient history, and epidemiological information. The expected result is Negative.  Fact Sheet for Patients:  PinkCheek.be  Fact Sheet for Healthcare Providers:  GravelBags.it  This test is no t yet approved or cleared by the [place] FDA and  has been authorized for detection and/or diagnosis of SARS-CoV-2 by FDA under an Emergency Use Authorization (EUA). This EUA will remain  in effect (meaning this test can be used) for the duration of the COVID-19 declaration under Section 564(b)(1) of the Act, 21 U.S.C. section 360bbb-3(b)(1), unless  the authorization is terminated or revoked sooner.     Influenza A by PCR NEGATIVE NEGATIVE Final   Influenza B by PCR NEGATIVE NEGATIVE Final    Comment: (NOTE) The Xpert Xpress SARS-CoV-2/FLU/RSV assay is intended as an aid in  the diagnosis of influenza from Nasopharyngeal swab specimens and  should not be used as a sole basis for treatment. Nasal washings and  aspirates are unacceptable for Xpert Xpress SARS-CoV-2/FLU/RSV  testing.  Fact Sheet for Patients: PinkCheek.be  Fact Sheet for Healthcare Providers: GravelBags.it  This test is not yet approved or cleared by the [place] FDA and  has been authorized for detection and/or diagnosis of SARS-CoV-2 by  FDA under an Emergency Use Authorization (EUA). This EUA will remain  in effect (meaning this test can be used) for the duration of the  Covid-19 declaration under Section 564(b)(1) of the Act, 21  U.S.C. section 360bbb-3(b)(1), unless the authorization is  terminated or revoked. Performed at [facility], [place] [address], [place], [place] [address]          Radiology Studies: No results found.      Scheduled Meds: . allopurinol  100 mg Oral Daily  . amLODipine  5 mg Oral Daily  . apixaban  5 mg Oral BID  . Chlorhexidine Gluconate Cloth  6 each Topical Q0600  . divalproex  250 mg Oral Q12H  . doxercalciferol  3 mcg Intravenous Q T,Th,Sa-HD  . feeding supplement (NEPRO CARB STEADY)  237 mL Oral BID BM  . ferric citrate  210 mg Oral TID WC  . hydrALAZINE  25 mg Oral TID  . hydrocerin   Topical Daily  . melatonin  3 mg Oral QHS  . multivitamin  1 tablet Oral QHS  . nebivolol  10 mg Oral QHS  . OLANZapine zydis  10 mg Oral QHS  . tamsulosin  0.4 mg Oral QHS  . Vitamin D (Ergocalciferol)  50,000 Units Oral Weekly   Continuous Infusions:        Aline August, MD [facility] 10/28/2019, 9:32 AM

## 2019-10-28 NOTE — Consult Note (Signed)
[facility] Nurse Consult Note: Reason for Consult: Patient does not wish to have wound care ordered previously, cannot ambulate with xeroform gauze between toes and Kerlix roll gauze woven between toes. Wound type: trauma, arterial insufficiency Pressure Injury POA: NA Measurement: tip of right foot, second digit with 1cm round area of traumatic injury, dried blood. Tip of left great toe, 1cm round callus with 0.2cm round open center. Wound bed: as described above Drainage (amount, consistency, odor) None Periwound: dry, intact, edematous (mild) Dressing procedure/placement/frequency: I will change the POC to washing feet once daily with soap and water, rinsing and patting dry. Feet are to be moisturized using house skin care products. Betadine swabstick to be applied to two open lesions, allowed to air dry. This will prevent infection and allow scab formation. Once dry, patient may wear clean slipper-socks (nonskid). No dressing.  [facility] nursing team will not follow, but will remain available to this patient, the nursing and medical teams.  Please re-consult if needed. Thanks, Maudie Flakes, MSN, RN, [place], Arther Abbott  Pager# [phone]

## 2019-10-28 NOTE — Progress Notes (Signed)
Round on patient secondary to missed HD. Patient reports that he has been having difficulty getting removed off the machine in time at his outpatient center to catch the [facility] bus. This caused him to have to pay $21.00 for an uber and he can not afford this. Patient also states he was rushing to get outside after he had fallen in his home to catch the bus on his missed treatment day and ran outside locking himself out. That's when he was found wandering outside by his son. Spoke with patient about the necessity of needing 24 hr assistance and therapy recommendation for SNF. Patient reports that he does not want to go to nursing home because his mother died in a nursing home. Will revisit with patient tomorrow. Will touch base with renal navigator in correlation to transportation needs.   Dorthey Sawyer, RN  Dialysis Nurse Coordinator

## 2019-10-29 DIAGNOSIS — Z515 Encounter for palliative care: Secondary | ICD-10-CM | POA: Diagnosis not present

## 2019-10-29 DIAGNOSIS — Z992 Dependence on renal dialysis: Secondary | ICD-10-CM | POA: Diagnosis not present

## 2019-10-29 DIAGNOSIS — Z66 Do not resuscitate: Secondary | ICD-10-CM

## 2019-10-29 DIAGNOSIS — N186 End stage renal disease: Secondary | ICD-10-CM | POA: Diagnosis not present

## 2019-10-29 DIAGNOSIS — R451 Restlessness and agitation: Secondary | ICD-10-CM | POA: Diagnosis not present

## 2019-10-29 DIAGNOSIS — R4182 Altered mental status, unspecified: Secondary | ICD-10-CM | POA: Diagnosis not present

## 2019-10-29 LAB — COMPREHENSIVE METABOLIC PANEL
ALT: 12 U/L (ref 0–44)
AST: 16 U/L (ref 15–41)
Albumin: 2.7 g/dL — ABNORMAL LOW (ref 3.5–5.0)
Alkaline Phosphatase: 62 U/L (ref 38–126)
Anion gap: 16 — ABNORMAL HIGH (ref 5–15)
BUN: 60 mg/dL — ABNORMAL HIGH (ref 8–23)
CO2: 26 mmol/L (ref 22–32)
Calcium: 8.7 mg/dL — ABNORMAL LOW (ref 8.9–10.3)
Chloride: 98 mmol/L (ref 98–111)
Creatinine, Ser: 9.69 mg/dL — ABNORMAL HIGH (ref 0.61–1.24)
GFR, Estimated: 5 mL/min — ABNORMAL LOW (ref >60)
Glucose, Bld: 134 mg/dL — ABNORMAL HIGH (ref 70–99)
Potassium: 3.9 mmol/L (ref 3.5–5.1)
Sodium: 140 mmol/L (ref 135–145)
Total Bilirubin: 0.5 mg/dL (ref 0.3–1.2)
Total Protein: 5.9 g/dL — ABNORMAL LOW (ref 6.5–8.1)

## 2019-10-29 LAB — CBC WITH DIFFERENTIAL/PLATELET
Abs Immature Granulocytes: 0.08 10*3/uL — ABNORMAL HIGH (ref 0.00–0.07)
Basophils Absolute: 0 10*3/uL (ref 0.0–0.1)
Basophils Relative: 1 %
Eosinophils Absolute: 0.3 10*3/uL (ref 0.0–0.5)
Eosinophils Relative: 5 %
HCT: 29.7 % — ABNORMAL LOW (ref 39.0–52.0)
Hemoglobin: 9.1 g/dL — ABNORMAL LOW (ref 13.0–17.0)
Immature Granulocytes: 1 %
Lymphocytes Relative: 24 %
Lymphs Abs: 1.5 10*3/uL (ref 0.7–4.0)
MCH: 29.3 pg (ref 26.0–34.0)
MCHC: 30.6 g/dL (ref 30.0–36.0)
MCV: 95.5 fL (ref 80.0–100.0)
Monocytes Absolute: 0.6 10*3/uL (ref 0.1–1.0)
Monocytes Relative: 9 %
Neutro Abs: 3.9 10*3/uL (ref 1.7–7.7)
Neutrophils Relative %: 60 %
Platelets: 256 10*3/uL (ref 150–400)
RBC: 3.11 MIL/uL — ABNORMAL LOW (ref 4.22–5.81)
RDW: 15 % (ref 11.5–15.5)
WBC: 6.4 10*3/uL (ref 4.0–10.5)
nRBC: 0 % (ref 0.0–0.2)

## 2019-10-29 LAB — FOLATE: Folate: 20.8 ng/mL (ref >5.9)

## 2019-10-29 LAB — TSH: TSH: 0.422 u[IU]/mL (ref 0.350–4.500)

## 2019-10-29 LAB — AMMONIA: Ammonia: 26 umol/L (ref 9–35)

## 2019-10-29 LAB — VITAMIN B12: Vitamin B-12: 739 pg/mL (ref 180–914)

## 2019-10-29 MED ORDER — DOXERCALCIFEROL 4 MCG/2ML IV SOLN
INTRAVENOUS | Status: AC
  Administered 2019-10-29: 3 ug via INTRAVENOUS
  Filled 2019-10-29: qty 2

## 2019-10-29 NOTE — Progress Notes (Signed)
PT Cancellation Note  Patient Details Name: Quantay Zaremba MRN: [id number] DOB: 1950-07-28   Cancelled Treatment:    Reason Eval/Treat Not Completed: Patient at procedure or test/unavailable Pt currently in HD. Will follow up as schedule allows.   Lou Miner, DPT  Acute Rehabilitation Services  Pager: [phone] Office: [phone]    Rudean Hitt 10/29/2019, 11:52 AM

## 2019-10-29 NOTE — Progress Notes (Addendum)
Patient ID: Duane Ortiz, male   DOB: October 31, 1950, 69 y.o.   MRN: [id number]  PROGRESS NOTE    Duane Ortiz  DGU:[id number] DOB: 1950-09-30 DOA: 10/24/2019 PCP: Sandi Mariscal, MD   Brief Narrative:  69 year old male with history of ESRD and hemodialysis, hypertension, PE on Eliquis presented with altered mental status, bizarre behavior and wandering in the street.  Patient was recently started on hemodialysis on 9/18.  According to the family, he lives alone, his sons check on him every day.  Apparently he missed the shuttle to go to dialysis on Tuesday.  Patient was wandering around the neighborhood.  Son found him outside the house totally confused so brought to the ER.  Apparently does have history of insomnia and some cognitive dysfunction in the past. In the emergency room he was confused otherwise stable.  Underwent emergent dialysis.  Subsequently mental status improved. He became agitated overnight of 10/26/2019/early morning of 10/27/2019 for which he required restraints.  PT recommended SNF placement.  Assessment & Plan:   Acute uremic encephalopathy Agitation/delirium Probable underlying undiagnosed dementia -Patient presented with altered mental status probably due to acute uremic encephalopathy requiring emergent hemodialysis and resolution of symptoms -Subsequently developed agitation  overnight of 10/26/2019/early morning of 10/27/2019 for which he required restraints and Haldol.  Subsequently restraints have been removed. -Patient has been started on Zyprexa and Depakote. -No evidence of CNS infection.  CT of the brain was negative for acute intracranial abnormality. -Patient probably has undiagnosed dementia.  Might need outpatient neurology evaluation -Monitor mental status.  Fall precautions. -PT recommends SNF placement.  Social worker consulted  End-stage renal disease on hemodialysis -Nephrology following.  Dialysis as per nephrology schedule  Anemia of chronic  disease -From renal failure.  Hemoglobin stable.  Monitor intermittently  Hypertension -Continue amlodipine, hydralazine, nebivolol and tamsulosin  History of PE -Continue Eliquis  Social: - Apparently lives alone with recently worsening cognition, missing hemodialysis.  Family is looking for more supervised care.  According to the patient's son, they are exploring options for him to go to a nursing home in [place]. -Social worker consulted for placement  Generalized deconditioning -Palliative care consult for goals of care discussion   DVT prophylaxis: Eliquis Code Status: Full Family Communication: Spoke to son/Tyrone on phone on 10/29/19 Disposition Plan: Status is: Inpatient  Remains inpatient appropriate because:Inpatient level of care appropriate due to severity of illness   Dispo:  Patient From: Home  Planned Disposition: [place]  Expected discharge date: 10/28/2019  medically stable for discharge: Yes  Consultants: Nephrology/palliative care  Procedures: None  Antimicrobials: None   Subjective: Patient seen and examined at bedside undergoing dialysis.  Extremely poor historian.  Awake but still confused to time.  No overnight fever, vomiting or extreme agitation reported by nursing staff. Objective: Vitals:   10/28/19 1129 10/28/19 1651 10/29/19 0512 10/29/19 0600  BP: (!) 143/69 (!) 153/67 (!) 141/67   Pulse:  72 65   Resp:  18 18   Temp:  97.9 F (36.6 C) 98 F (36.7 C)   TempSrc:  Oral    SpO2:  97% 100%   Weight:      Height:    5\' 7"  (1.702 m)    Intake/Output Summary (Last 24 hours) at 10/29/2019 0748 Last data filed at 10/29/2019 0200 Gross per 24 hour  Intake 1137 ml  Output 201 ml  Net 936 ml   Filed Weights   10/26/19 0645 10/26/19 1055 10/26/19 2033  Weight: 87.2 kg  85.2 kg 85.9 kg    Examination:  General exam: No distress.  Looks chronically ill.  Poor historian.  Still confused to time  respiratory  system: Bilateral decreased breath sounds at bases with some crackles, no wheezing  cardiovascular system: Rate controlled, S1-S2 heard Gastrointestinal system: Abdomen is nondistended, soft and nontender.  Bowel sounds are heard  extremities: Mild lower extremity edema present.  No clubbing   Data Reviewed: I have personally reviewed following labs and imaging studies  CBC: Recent Labs  Lab 10/24/19 1107 10/26/19 0704 10/28/19 0239 10/29/19 0147  WBC 9.1 6.4 6.6 6.4  NEUTROABS 6.6  --   --  3.9  HGB 8.6* 8.7* 8.8* 9.1*  HCT 29.7* 27.5* 29.4* 29.7*  MCV 97.1 91.1 95.1 95.5  PLT 286 259 254 268   Basic Metabolic Panel: Recent Labs  Lab 10/24/19 1107 10/25/19 0535 10/26/19 0705 10/28/19 0239 10/29/19 0147  NA 142 135 135 139 140  K 4.6 3.9 3.4* 3.7 3.9  CL 100 91* 94* 100 98  CO2 18* 23 27 26 26   GLUCOSE 75 115* 163* 94 134*  BUN 102* 33* 45* 42* 60*  CREATININE 12.68* 6.38* 8.65* 8.30* 9.69*  CALCIUM 8.3* 8.6* 8.2* 8.6* 8.7*  MG  --   --   --  2.1  --   PHOS  --   --  7.3* 5.8*  --    GFR: Estimated Creatinine Clearance: 7.3 mL/min (A) (by C-G formula based on SCr of 9.69 mg/dL (H)). Liver Function Tests: Recent Labs  Lab 10/24/19 1107 10/26/19 0705 10/28/19 0239 10/29/19 0147  AST 23  --  19 16  ALT 14  --  12 12  ALKPHOS 55  --  52 62  BILITOT 0.8  --  0.6 0.5  PROT 6.9  --  6.4* 5.9*  ALBUMIN 3.3* 2.8* 2.9* 2.7*   No results for input(s): LIPASE, AMYLASE in the last 168 hours. Recent Labs  Lab 10/28/19 0239 10/29/19 0147  AMMONIA 35 26   Coagulation Profile: No results for input(s): INR, PROTIME in the last 168 hours. Cardiac Enzymes: No results for input(s): CKTOTAL, CKMB, CKMBINDEX, TROPONINI in the last 168 hours. BNP (last 3 results) No results for input(s): PROBNP in the last 8760 hours. HbA1C: No results for input(s): HGBA1C in the last 72 hours. CBG: Recent Labs  Lab 10/24/19 1012 10/25/19 0121 10/26/19 2159 10/27/19 0643  10/27/19 1225  GLUCAP 70 77 93 98 116*   Lipid Profile: No results for input(s): CHOL, HDL, LDLCALC, TRIG, CHOLHDL, LDLDIRECT in the last 72 hours. Thyroid Function Tests: Recent Labs    10/29/19 0147  TSH 0.422   Anemia Panel: Recent Labs    10/29/19 0147  VITAMINB12 739  FOLATE 20.8   Sepsis Labs: No results for input(s): PROCALCITON, LATICACIDVEN in the last 168 hours.  Recent Results (from the past 240 hour(s))  Respiratory Panel by RT PCR (Flu A&B, Covid) - Nasopharyngeal Swab     Status: None   Collection Time: 10/24/19  2:46 PM   Specimen: Nasopharyngeal Swab  Result Value Ref Range Status   SARS Coronavirus 2 by RT PCR NEGATIVE NEGATIVE Final    Comment: (NOTE) SARS-CoV-2 target nucleic acids are NOT DETECTED.  The SARS-CoV-2 RNA is generally detectable in upper respiratoy specimens during the acute phase of infection. The lowest concentration of SARS-CoV-2 viral copies this assay can detect is 131 copies/mL. A negative result does not preclude SARS-Cov-2 infection and should not be used as  the sole basis for treatment or other patient management decisions. A negative result may occur with  improper specimen collection/handling, submission of specimen other than nasopharyngeal swab, presence of viral mutation(s) within the areas targeted by this assay, and inadequate number of viral copies (<131 copies/mL). A negative result must be combined with clinical observations, patient history, and epidemiological information. The expected result is Negative.  Fact Sheet for Patients:  PinkCheek.be  Fact Sheet for Healthcare Providers:  GravelBags.it  This test is no t yet approved or cleared by the [place] FDA and  has been authorized for detection and/or diagnosis of SARS-CoV-2 by FDA under an Emergency Use Authorization (EUA). This EUA will remain  in effect (meaning this test can be used) for the  duration of the COVID-19 declaration under Section 564(b)(1) of the Act, 21 U.S.C. section 360bbb-3(b)(1), unless the authorization is terminated or revoked sooner.     Influenza A by PCR NEGATIVE NEGATIVE Final   Influenza B by PCR NEGATIVE NEGATIVE Final    Comment: (NOTE) The Xpert Xpress SARS-CoV-2/FLU/RSV assay is intended as an aid in  the diagnosis of influenza from Nasopharyngeal swab specimens and  should not be used as a sole basis for treatment. Nasal washings and  aspirates are unacceptable for Xpert Xpress SARS-CoV-2/FLU/RSV  testing.  Fact Sheet for Patients: PinkCheek.be  Fact Sheet for Healthcare Providers: GravelBags.it  This test is not yet approved or cleared by the [place] FDA and  has been authorized for detection and/or diagnosis of SARS-CoV-2 by  FDA under an Emergency Use Authorization (EUA). This EUA will remain  in effect (meaning this test can be used) for the duration of the  Covid-19 declaration under Section 564(b)(1) of the Act, 21  U.S.C. section 360bbb-3(b)(1), unless the authorization is  terminated or revoked. Performed at [facility], [place] [address] [place], [place] [address]          Radiology Studies: No results found.      Scheduled Meds: . allopurinol  100 mg Oral Daily  . amLODipine  5 mg Oral Daily  . apixaban  5 mg Oral BID  . Chlorhexidine Gluconate Cloth  6 each Topical Q0600  . divalproex  250 mg Oral Q12H  . doxercalciferol  3 mcg Intravenous Q T,Th,Sa-HD  . feeding supplement (NEPRO CARB STEADY)  237 mL Oral BID BM  . ferric citrate  210 mg Oral TID WC  . hydrALAZINE  25 mg Oral TID  . hydrocerin   Topical Daily  . melatonin  3 mg Oral QHS  . multivitamin  1 tablet Oral QHS  . nebivolol  10 mg Oral QHS  . OLANZapine zydis  10 mg Oral QHS  . tamsulosin  0.4 mg Oral QHS  . Vitamin D (Ergocalciferol)  50,000 Units Oral Weekly   Continuous  Infusions:        Aline August, MD [facility] 10/29/2019, 7:48 AM

## 2019-10-29 NOTE — TOC Progression Note (Signed)
Transition of Care [facility]) - Progression Note    Patient Details  Name: Vadhir Mcnay MRN: [id number] Date of Birth: Jun 20, 1950  [facility] CM/SW Contact  Sharlet Salina Mila Homer, LCSW Phone Number: 10/29/2019, 4:57 PM  Clinical Narrative:  Talked by phone (1:21 pm) with patient's son Jiles Prows [phone]) regarding d/c plan for his dad. CSW informed by son that was on the way to the hospital, as he was meeting with someone at 1:30 pm. Son indicated that the plan remains for his dad to return to [place] to live and he is looking into a Senior housing (ALF/Independent?). Mr. Belt informed CSW that he will call after his meeting.      Expected Discharge Plan: [place] Barriers to Discharge: Continued Medical Work up  Expected Discharge Plan and Services Expected Discharge Plan: [place] In-house Referral: Clinical Social Work     Living arrangements for the past 2 months: Apartment                                     Social Determinants of Health (SDOH) Interventions  Discharge disposition pending, in terms of exactly where patient will discharge to in [place], [place].  Readmission Risk Interventions Readmission Risk Prevention Plan 10/03/2019  Transportation Screening Complete  PCP or Specialist Appt within 3-5 Days Not Complete  HRI or [place] Complete  Social Work Consult for [place] Planning/Counseling Complete  Palliative Care Screening Not Applicable  Medication Review Press photographer) Complete  Some recent data might be hidden

## 2019-10-29 NOTE — Progress Notes (Signed)
[facility] Progress Note   Subjective:  Seen on HD - 2L UFG and tolerating. No CP/dyspnea. Calm, oriented today.  Objective Vitals:   10/29/19 0600 10/29/19 0810 10/29/19 0817 10/29/19 0830  BP:  (!) 151/77 (!) 154/69 134/71  Pulse:  71 69 67  Resp:  16 15 14   Temp:  98.4 F (36.9 C)    TempSrc:  Oral    SpO2:  95%    Weight:  87.9 kg    Height: 5\' 7"  (1.702 m)      Physical Exam General: Well appearing man, NAD Heart: RRR; no murmur Lungs: CTA anteriorly Abdomen: soft, non-tender Extremities: Trace LE edema Dialysis Access: AVG + bruit  Additional Objective Labs: Basic Metabolic Panel: Recent Labs  Lab 10/26/19 0705 10/28/19 0239 10/29/19 0147  NA 135 139 140  K 3.4* 3.7 3.9  CL 94* 100 98  CO2 27 26 26   GLUCOSE 163* 94 134*  BUN 45* 42* 60*  CREATININE 8.65* 8.30* 9.69*  CALCIUM 8.2* 8.6* 8.7*  PHOS 7.3* 5.8*  --    Liver Function Tests: Recent Labs  Lab 10/24/19 1107 10/24/19 1107 10/26/19 0705 10/28/19 0239 10/29/19 0147  AST 23  --   --  19 16  ALT 14  --   --  12 12  ALKPHOS 55  --   --  52 62  BILITOT 0.8  --   --  0.6 0.5  PROT 6.9  --   --  6.4* 5.9*  ALBUMIN 3.3*   < > 2.8* 2.9* 2.7*   < > = values in this interval not displayed.   CBC: Recent Labs  Lab 10/24/19 1107 10/24/19 1107 10/26/19 0704 10/28/19 0239 10/29/19 0147  WBC 9.1   < > 6.4 6.6 6.4  NEUTROABS 6.6  --   --   --  3.9  HGB 8.6*   < > 8.7* 8.8* 9.1*  HCT 29.7*   < > 27.5* 29.4* 29.7*  MCV 97.1  --  91.1 95.1 95.5  PLT 286   < > 259 254 256   < > = values in this interval not displayed.   Medications:  . allopurinol  100 mg Oral Daily  . amLODipine  5 mg Oral Daily  . apixaban  5 mg Oral BID  . Chlorhexidine Gluconate Cloth  6 each Topical Q0600  . divalproex  250 mg Oral Q12H  . doxercalciferol  3 mcg Intravenous Q T,Th,Sa-HD  . feeding supplement (NEPRO CARB STEADY)  237 mL Oral BID BM  . ferric citrate  210 mg Oral TID WC  . hydrALAZINE   25 mg Oral TID  . hydrocerin   Topical Daily  . melatonin  3 mg Oral QHS  . multivitamin  1 tablet Oral QHS  . nebivolol  10 mg Oral QHS  . OLANZapine zydis  10 mg Oral QHS  . tamsulosin  0.4 mg Oral QHS  . Vitamin D (Ergocalciferol)  50,000 Units Oral Weekly    Dialysis Orders: [facility] - relatively new ESRD, started HD 09/26/19 4h 85.5kg 2/2.5 bath Hep2000 AVG - hect 3 tiw - venofer 50 qwk - mircera 150 q 2, last 10/14  Assessment/Plan: 1. AMS: Initially felt possibly d/t uremia with missed HD. Head CT negative, no obvious infections. Possibly just worsening dementia. Overall MS is clearer but with prn delirium. Placement pending. 2. ESRD: Continue HD per TTS schedule - HD today (10/26). 2L UF, 3K bath today. 3. Anemia of ESRD:  Hgb 9.1 - slow improvement. Will be due for Mircera later this week. 4. Secondary hyperparathyroidism: Ca ok, Phos improving. Continue hectorol + Auryxia. 5.HTN/volume: BP variable, CXR without edema. Keeping same EDW for now.   6. Nutrition: Alb low, continue Nepro. 7. Dispo: PT recommends SNF and 24/7 assist for safety and rehab (currently lives alone and sons stop by in evenings. SW involved.  Duane Penton, PA-C 10/29/2019, 8:43 AM  [employer]

## 2019-10-29 NOTE — Consult Note (Signed)
Consultation Note Date: 10/29/2019   Patient Name: Duane Ortiz  DOB: 05/28/1950  MRN: [id number]  Age / Sex: 69 y.o., male  PCP: Sandi Mariscal, MD Referring Physician: Aline August, MD  Reason for Consultation: Establishing goals of care and Psychosocial/spiritual support  HPI/Patient Profile: 69 y.o. male   admitted on 10/24/2019 with past  medical history significant of ESRD on HD, HTN, PE on Eliquis, presented with altered mental status,  behavior, patient more confused, currently provide partial history and the rest of the history pleaded in by his son [name] over the phone.  Patient lives by himself with both sons who  stop by frequently in the evenings.   Patient was recently started on hemodialysis due to prolonged history of HTN nephropathy on 09/21/2019.     Patient and family face treatment option decisions, advanced directive decisions and anticipatory care needs.  Clinical Assessment and Goals of Care:   This NP Wadie Lessen reviewed medical records, received report from team, assessed the patient and then meet at the patient's bedside along with his son/Tyrone  to discuss diagnosis, prognosis, GOC, EOL wishes disposition and options.   Concept of Palliative Care was introduced as specialized medical care for people and their families living with serious illness.  If focuses on providing relief from the symptoms and stress of a serious illness.  The goal is to improve quality of life for both the patient and the family.  Created space and opportunity for patient  and family to explore thoughts and feelings regarding current medical information.   Patient's son verbalizes frustration with his fathers choices regarding overall living situation and healthcare decisions.  Patient himself verbalizes that he does not need any help, that he is perfectly fine at home in the current situation.  He engages  very little in today's conversation.  Patient currently with medical decisional capacity.   A  discussion was had today regarding advanced directives.  Concepts specific to code status, artifical feeding and hydration, continued IV antibiotics and rehospitalization was had.  The difference between a aggressive medical intervention path  and a palliative comfort care path for this patient at this time was had.  Values and goals of care important to patient and family were attempted to be elicited.   MOST form introduced, patient does not wish to complete.   Natural trajectory and expectations at EOL were discussed.  Questions and concerns addressed.  Patient  encouraged to call with questions or concerns.     PMT will continue to support holistically.         No documented HPOA or AD.  Patient verbalizes that he is not interested in naming a healthcare power of attorney or completing any documents.  He is able to verbalize his desire for DNR/DNI status and no artificial feeding now or in the future.  SUMMARY OF RECOMMENDATIONS    Code Status/Advance Care Planning:  - DNR/DNI -No artificial feeding now or in the future.   Symptom Management:   -Per attending  Palliative Prophylaxis:  Aspiration, Bowel Regimen, Delirium Protocol, Frequent Pain Assessment and Oral Care  Additional Recommendations (Limitations, Scope, Preferences):  Full Scope Treatment  Psycho-social/Spiritual:   Desire for further Chaplaincy support:yes  Additional Recommendations: Education on Hospice  Prognosis:   Unable to determine  Discharge Planning:  Strongly recommend transition from hospital to skilled nursing facility for short-term rehab.  Patient's family is hopeful that Mr. Aldous will consider this.   Recommend outpatient community-based palliative care services.  They worry about safety at home.  Patient has capacity to make his own decisions regardless if they are perceived as poor  choices   To Be Determined      Primary Diagnoses: Present on Admission: . AMS (altered mental status)   I have reviewed the medical record, interviewed the patient and family, and examined the patient. The following aspects are pertinent.  Past Medical History:  Diagnosis Date  . Arthritis   . Chronic kidney disease   . Chronic kidney disease   . Diabetes ([place]) 02/012017  . Fainting   . Gait abnormality 10/11/2016  . GERD (gastroesophageal reflux disease)   . Gout   . Headache   . Hypercholesteremia   . Hypercholesterolemia   . Hypertension   . Peripheral neuropathy 04/02/2019  . Pulmonary embolism ([place])   . Shortness of breath   . Sleep apnea    does wear cpap  . Wears dentures    Social History   Socioeconomic History  . Marital status: Divorced    Spouse name: Not on file  . Number of children: 3  . Years of education: 10  . Highest education level: Not on file  Occupational History  . Not on file  Tobacco Use  . Smoking status: Current Some Day Smoker    Packs/day: 0.25    Types: Cigarettes  . Smokeless tobacco: Never Used  Vaping Use  . Vaping Use: Never used  Substance and Sexual Activity  . Alcohol use: Not Currently    Alcohol/week: 0.0 standard drinks  . Drug use: No  . Sexual activity: Not on file  Other Topics Concern  . Not on file  Social History Narrative   Lives alone   Caffeine use: Sometimes   Right handed    Social Determinants of Health   Financial Resource Strain:   . Difficulty of Paying Living Expenses: Not on file  Food Insecurity:   . Worried About Charity fundraiser in the Last Year: Not on file  . Ran Out of Food in the Last Year: Not on file  Transportation Needs:   . Lack of Transportation (Medical): Not on file  . Lack of Transportation (Non-Medical): Not on file  Physical Activity:   . Days of Exercise per Week: Not on file  . Minutes of Exercise per Session: Not on file  Stress:   . Feeling of Stress : Not on  file  Social Connections:   . Frequency of Communication with Friends and Family: Not on file  . Frequency of Social Gatherings with Friends and Family: Not on file  . Attends Religious Services: Not on file  . Active Member of Clubs or Organizations: Not on file  . Attends Archivist Meetings: Not on file  . Marital Status: Not on file   Family History  Problem Relation Age of Onset  . Diabetes Mother   . Heart disease Mother   . Hypertension Mother   . Diabetes Sister   . Hypertension Sister   . Diabetes  Brother   . Hypertension Brother   . Breast cancer Paternal Grandmother    Scheduled Meds: . allopurinol  100 mg Oral Daily  . amLODipine  5 mg Oral Daily  . apixaban  5 mg Oral BID  . Chlorhexidine Gluconate Cloth  6 each Topical Q0600  . divalproex  250 mg Oral Q12H  . doxercalciferol  3 mcg Intravenous Q T,Th,Sa-HD  . feeding supplement (NEPRO CARB STEADY)  237 mL Oral BID BM  . ferric citrate  210 mg Oral TID WC  . hydrALAZINE  25 mg Oral TID  . hydrocerin   Topical Daily  . melatonin  3 mg Oral QHS  . multivitamin  1 tablet Oral QHS  . nebivolol  10 mg Oral QHS  . OLANZapine zydis  10 mg Oral QHS  . tamsulosin  0.4 mg Oral QHS  . Vitamin D (Ergocalciferol)  50,000 Units Oral Weekly   Continuous Infusions: PRN Meds:.acetaminophen **OR** acetaminophen, ondansetron **OR** ondansetron (ZOFRAN) IV Medications Prior to Admission:  Prior to Admission medications   Medication Sig Start Date End Date Taking? Authorizing Provider  allopurinol (ZYLOPRIM) 100 MG tablet Take 100 mg by mouth daily.   Yes [provider]  amLODipine (NORVASC) 5 MG tablet Take 1 tablet (5 mg total) by mouth daily. 09/25/19  Yes Annita Brod, MD  apixaban (ELIQUIS) 5 MG TABS tablet Take 1 tablet (5 mg total) by mouth 2 (two) times daily. 09/24/19  Yes Annita Brod, MD  calcitRIOL (ROCALTROL) 0.25 MCG capsule Take 0.25 mcg by mouth daily.  01/31/19  Yes [provider]  cloNIDine (CATAPRES) 0.1 MG tablet Take 1 tablet (0.1 mg total) by mouth 3 (three) times daily. 09/24/19  Yes Annita Brod, MD  ergocalciferol (VITAMIN D2) 1.25 MG (50000 UT) capsule Take 50,000 Units by mouth once a week.  08/02/18  Yes [provider]  ferric citrate (AURYXIA) 1 GM 210 MG(Fe) tablet Take 210 mg by mouth 3 (three) times daily with meals.    Yes [provider]  hydrALAZINE (APRESOLINE) 25 MG tablet Take 1 tablet (25 mg total) by mouth 3 (three) times daily. 09/24/19  Yes Annita Brod, MD  nebivolol (BYSTOLIC) 10 MG tablet Take 10 mg by mouth at bedtime.    Yes [provider]  sodium bicarbonate 650 MG tablet Take 650 mg by mouth 2 (two) times daily.    Yes [provider]  tamsulosin (FLOMAX) 0.4 MG CAPS capsule Take 0.4 mg by mouth at bedtime.    Yes [provider]  oxyCODONE-acetaminophen (PERCOCET) 5-325 MG tablet Take 1 tablet by mouth every 4 (four) hours as needed for severe pain. Patient not taking: Reported on 10/24/2019 08/15/19 08/14/20  Serafina [place], MD   Allergies  Allergen Reactions  . Baclofen Other (See Comments)    confusion  . Gabapentin     Felt like I was out of my head, slept all day, confusion   . Sulfa Antibiotics Itching   Review of Systems  Constitutional: Positive for fatigue.    Physical Exam Cardiovascular:     Rate and Rhythm: Normal rate.  Pulmonary:     Effort: Pulmonary effort is normal.  Skin:    General: Skin is warm and dry.  Neurological:     Mental Status: He is alert.  Psychiatric:        Behavior: Behavior is agitated.     Vital Signs: BP 139/76   Pulse 65   Temp  98.4 F (36.9 C) (Oral)   Resp 15   Ht 5\' 7"  (1.702 m)   Wt 87.9 kg   SpO2 95%   BMI 30.35 kg/m  Pain Scale: 0-10   Pain Score: 0-No pain   SpO2: SpO2: 95 % O2 Device:SpO2: 95 % O2 Flow Rate: .O2 Flow Rate (L/min): 2 L/min  IO: Intake/output summary:   Intake/Output  Summary (Last 24 hours) at 10/29/2019 5825 Last data filed at 10/29/2019 0800 Gross per 24 hour  Intake 1217 ml  Output 200 ml  Net 1017 ml    LBM: Last BM Date: 10/27/19 Baseline Weight: Weight: 86.5 kg Most recent weight: Weight: 87.9 kg     Palliative Assessment/Data: 40 %    Discussed with attending  Time In: 1300 Time Out: 1410 Time Total: 70 minutes Greater than 50%  of this time was spent counseling and coordinating care related to the above assessment and plan.  Signed by: Wadie Lessen, NP   Please contact Palliative Medicine Team phone at [phone] for questions and concerns.  For individual provider: See Shea Evans

## 2019-10-30 DIAGNOSIS — R4182 Altered mental status, unspecified: Secondary | ICD-10-CM | POA: Diagnosis not present

## 2019-10-30 LAB — CBC WITH DIFFERENTIAL/PLATELET
Abs Immature Granulocytes: 0.11 10*3/uL — ABNORMAL HIGH (ref 0.00–0.07)
Basophils Absolute: 0 10*3/uL (ref 0.0–0.1)
Basophils Relative: 0 %
Eosinophils Absolute: 0.2 10*3/uL (ref 0.0–0.5)
Eosinophils Relative: 3 %
HCT: 31.8 % — ABNORMAL LOW (ref 39.0–52.0)
Hemoglobin: 9.5 g/dL — ABNORMAL LOW (ref 13.0–17.0)
Immature Granulocytes: 2 %
Lymphocytes Relative: 19 %
Lymphs Abs: 1.4 10*3/uL (ref 0.7–4.0)
MCH: 28.4 pg (ref 26.0–34.0)
MCHC: 29.9 g/dL — ABNORMAL LOW (ref 30.0–36.0)
MCV: 94.9 fL (ref 80.0–100.0)
Monocytes Absolute: 0.7 10*3/uL (ref 0.1–1.0)
Monocytes Relative: 10 %
Neutro Abs: 4.8 10*3/uL (ref 1.7–7.7)
Neutrophils Relative %: 66 %
Platelets: 268 10*3/uL (ref 150–400)
RBC: 3.35 MIL/uL — ABNORMAL LOW (ref 4.22–5.81)
RDW: 14.9 % (ref 11.5–15.5)
WBC: 7.3 10*3/uL (ref 4.0–10.5)
nRBC: 0 % (ref 0.0–0.2)

## 2019-10-30 LAB — BASIC METABOLIC PANEL
Anion gap: 11 (ref 5–15)
BUN: 39 mg/dL — ABNORMAL HIGH (ref 8–23)
CO2: 28 mmol/L (ref 22–32)
Calcium: 9 mg/dL (ref 8.9–10.3)
Chloride: 100 mmol/L (ref 98–111)
Creatinine, Ser: 5.95 mg/dL — ABNORMAL HIGH (ref 0.61–1.24)
GFR, Estimated: 9 mL/min — ABNORMAL LOW (ref >60)
Glucose, Bld: 120 mg/dL — ABNORMAL HIGH (ref 70–99)
Potassium: 3.9 mmol/L (ref 3.5–5.1)
Sodium: 139 mmol/L (ref 135–145)

## 2019-10-30 MED ORDER — DARBEPOETIN ALFA 150 MCG/0.3ML IJ SOSY
150.0000 ug | PREFILLED_SYRINGE | INTRAMUSCULAR | Status: DC
  Administered 2019-10-31: 150 ug via INTRAVENOUS

## 2019-10-30 NOTE — TOC Progression Note (Addendum)
Transition of Care [facility]) - Progression Note    Patient Details  Name: Duane Ortiz MRN: [id number] Date of Birth: May 21, 1950  Transition of Care [facility]) CM/SW Contact  Sharlet Salina Mila Homer, LCSW Phone Number: 10/30/2019, 3:02 PM  Clinical Narrative:   Call made to patient's son Duane Ortiz to inform him that his dad has been medically stable for discharge and that CSW will visit with him again regarding SNF. Son indicated that he has a key to his dad's house, as his dad has loss his house key. Son added that he will pick his dad up from the hospital when the doctor discharges him.  Visited with patient and he was alert, lying in bed watching TV. CSW asked again about him going to a facility for ST rehab or going home and [name] indicated that he was not going to a nursing home. CSW informed him that the doctor would be contacted and that [facility] could be set-up or continued. Secure chat with MD regarding d/c plan.  Nurse case manager Nira Conn contacted and updated.       Expected Discharge Plan: [place] Barriers to Discharge: Continued Medical Work up  Expected Discharge Plan and Services Expected Discharge Plan: [place] In-house Referral: Clinical Social Work     Living arrangements for the past 2 months: Apartment                                       Social Determinants of Health (SDOH) Interventions  Patient will be moving back to [place]patient's hometown[place] at some point.  Readmission Risk Interventions Readmission Risk Prevention Plan 10/03/2019  Transportation Screening Complete  PCP or Specialist Appt within 3-5 Days Not Complete  HRI or [place] Complete  Social Work Consult for [place] Planning/Counseling Complete  Palliative Care Screening Not Applicable  Medication Review Press photographer) Complete  Some recent data might be hidden

## 2019-10-30 NOTE — Plan of Care (Signed)
  Problem: Education: Goal: Knowledge of General Education information will improve Description Including pain rating scale, medication(s)/side effects and non-pharmacologic comfort measures Outcome: Progressing   

## 2019-10-30 NOTE — Progress Notes (Signed)
[facility] Progress Note   Subjective:  Seen in room. No CP/dyspnea today - did fine with dialysis yesterday. Placement pending.  Objective Vitals:   10/29/19 1631 10/29/19 2212 10/30/19 0435 10/30/19 0931  BP: 131/67 (!) 151/70 (!) 148/68 (!) 142/68  Pulse: 89 80 78 92  Resp: 18 18 18 18   Temp: 97.8 F (36.6 C) 99.1 F (37.3 C) 99 F (37.2 C) 98.2 F (36.8 C)  TempSrc: Oral Oral Oral Oral  SpO2: 98% 92% 95% 98%  Weight:      Height:       Physical Exam General:Well appearing man, NAD Heart:RRR; no murmur Lungs:CTA anteriorly Abdomen:soft, non-tender Extremities:No LE edema Dialysis Access:AVG + bruit  Additional Objective Labs: Basic Metabolic Panel: Recent Labs  Lab 10/26/19 0705 10/26/19 0705 10/28/19 0239 10/29/19 0147 10/30/19 0111  NA 135   < > 139 140 139  K 3.4*   < > 3.7 3.9 3.9  CL 94*   < > 100 98 100  CO2 27   < > 26 26 28   GLUCOSE 163*   < > 94 134* 120*  BUN 45*   < > 42* 60* 39*  CREATININE 8.65*   < > 8.30* 9.69* 5.95*  CALCIUM 8.2*   < > 8.6* 8.7* 9.0  PHOS 7.3*  --  5.8*  --   --    < > = values in this interval not displayed.   Liver Function Tests: Recent Labs  Lab 10/24/19 1107 10/24/19 1107 10/26/19 0705 10/28/19 0239 10/29/19 0147  AST 23  --   --  19 16  ALT 14  --   --  12 12  ALKPHOS 55  --   --  52 62  BILITOT 0.8  --   --  0.6 0.5  PROT 6.9  --   --  6.4* 5.9*  ALBUMIN 3.3*   < > 2.8* 2.9* 2.7*   < > = values in this interval not displayed.   CBC: Recent Labs  Lab 10/24/19 1107 10/24/19 1107 10/26/19 0704 10/26/19 0704 10/28/19 0239 10/29/19 0147 10/30/19 0111  WBC 9.1   < > 6.4   < > 6.6 6.4 7.3  NEUTROABS 6.6  --   --   --   --  3.9 4.8  HGB 8.6*   < > 8.7*   < > 8.8* 9.1* 9.5*  HCT 29.7*   < > 27.5*   < > 29.4* 29.7* 31.8*  MCV 97.1  --  91.1  --  95.1 95.5 94.9  PLT 286   < > 259   < > 254 256 268   < > = values in this interval not displayed.   Medications:   allopurinol   100 mg Oral Daily   amLODipine  5 mg Oral Daily   apixaban  5 mg Oral BID   Chlorhexidine Gluconate Cloth  6 each Topical Q0600   divalproex  250 mg Oral Q12H   doxercalciferol  3 mcg Intravenous Q T,Th,Sa-HD   feeding supplement (NEPRO CARB STEADY)  237 mL Oral BID BM   ferric citrate  210 mg Oral TID WC   hydrALAZINE  25 mg Oral TID   hydrocerin   Topical Daily   melatonin  3 mg Oral QHS   multivitamin  1 tablet Oral QHS   nebivolol  10 mg Oral QHS   OLANZapine zydis  10 mg Oral QHS   tamsulosin  0.4 mg Oral QHS   Vitamin  D (Ergocalciferol)  50,000 Units Oral Weekly    Dialysis Orders: [facility]- relatively new ESRD, started HD 09/26/19 4h 85.5kg 2/2.5 bath Hep 2000 AVG - hect 3 tiw - venofer 50 qwk - mircera 150 q 2, last 10/14  Assessment/Plan: 1. AMS: Initially felt possibly d/t uremia with missed HD. Head CT negative, no obvious infections. Possibly just worsening dementia. Overall MS is clearer but with prn delirium. Placement pending. 2. ESRD: Continue HD perTTSschedule - next tomorrow (10/28). 3. Anemiaof ESRD: Hgb 9.5 - slow improvement. Will be due for ESA tomorrow. 4. Secondary hyperparathyroidism: Ca ok, Phos improving. Continue hectorol+ Auryxia. 5.HTN/volume:BP variable, CXR without edema. Keeping same EDW for now.  6. Nutrition: Alb low, continue Nepro. 7. Dispo:PT recommends SNF and 24/7 assist for safety and rehab (currently lives alone and sons stop by in evenings. SW involved.  Veneta Penton, PA-C 10/30/2019, 10:41 AM  [employer]

## 2019-10-30 NOTE — Progress Notes (Signed)
Patient ID: Duane Ortiz, male   DOB: Dec 04, 1950, 69 y.o.   MRN: [id number]  PROGRESS NOTE    Odell Choung  FWY:[id number] DOB: [birth year] DOA: 10/24/2019 PCP: Sandi Mariscal, MD   Brief Narrative:  69 year old male with history of ESRD and hemodialysis, hypertension, PE on Eliquis presented with altered mental status, bizarre behavior and wandering in the street.  Patient was recently started on hemodialysis on 9/18.  According to the family, he lives alone, his sons check on him every day.  Apparently he missed the shuttle to go to dialysis on Tuesday.  Patient was wandering around the neighborhood.  Son found him outside the house totally confused so brought to the ER.  Apparently does have history of insomnia and some cognitive dysfunction in the past. In the emergency room he was confused otherwise stable.  Underwent emergent dialysis.  Subsequently mental status improved. He became agitated overnight of 10/26/2019/early morning of 10/27/2019 for which he required restraints.  PT recommended SNF placement.  Assessment & Plan:   Acute metabolic encephalopathy Agitation/delirium Cognitive dysfunction -Patient presented with altered mental status probably due to uremia from missed hemodialysis requiring emergent hemodialysis and resolution of symptoms -Subsequently developed agitation  overnight early morning of 10/24 requiring restraints and IV Haldol, started on Depakote and Zyprexa -No infectious process identified, imaging studies including CT brain unremarkable -Suspected to have a component of cognitive dysfunction/early dementia  -Mental status is improved and is now stable however with cognitive deficits -PT recommends SNF placement.  Social worker consulted -Discharge planning, also limited by poor social/family support, they are contemplating moving to [place]  End-stage renal disease on hemodialysis -Nephrology following.  Dialysis as per nephrology schedule  Anemia of  chronic disease -Stable, continue EPO with HD  Hypertension -Continue amlodipine, hydralazine, nebivolol and tamsulosin  History of PE -Continue Eliquis  Social: - Apparently lives alone with recently worsening cognition, missing hemodialysis.  Family is looking for more supervised care.  According to the patient's son, they are exploring options for him to go to a nursing home in [place]. -Social worker consulted for placement   DVT prophylaxis: Eliquis Code Status: Full Family Communication: No family at bedside Disposition Plan: Status is: Inpatient  Remains inpatient appropriate because:Inpatient level of care appropriate due to severity of illness   Dispo:  Patient From: Home  Planned Disposition: [place]  Expected discharge date: Unknown  medically stable for discharge: Yes  Consultants: Nephrology/palliative care  Procedures: None  Antimicrobials: None   Subjective: -Sitting comfortably in bed, denies any complaints, talking about going to [place] Objective: Vitals:   10/29/19 1631 10/29/19 2212 10/30/19 0435 10/30/19 0931  BP: 131/67 (!) 151/70 (!) 148/68 (!) 142/68  Pulse: 89 80 78 92  Resp: 18 18 18 18   Temp: 97.8 F (36.6 C) 99.1 F (37.3 C) 99 F (37.2 C) 98.2 F (36.8 C)  TempSrc: Oral Oral Oral Oral  SpO2: 98% 92% 95% 98%  Weight:      Height:        Intake/Output Summary (Last 24 hours) at 10/30/2019 1358 Last data filed at 10/30/2019 0900 Gross per 24 hour  Intake 640 ml  Output 500 ml  Net 140 ml   Filed Weights   10/26/19 2033 10/29/19 0810 10/29/19 1214  Weight: 85.9 kg 87.9 kg 85.9 kg    Examination:  Gen: Elderly frail male sitting up in bed, awake alert oriented to self, place and partly to time, mild cognitive deficits noted HEENT:  No JVD CVS: Decreased breath sounds the bases Abdomen: Soft, nontender,  extremities: Trace edema, AV graft noted  Data Reviewed: I have personally reviewed  following labs and imaging studies  CBC: Recent Labs  Lab 10/24/19 1107 10/26/19 0704 10/28/19 0239 10/29/19 0147 10/30/19 0111  WBC 9.1 6.4 6.6 6.4 7.3  NEUTROABS 6.6  --   --  3.9 4.8  HGB 8.6* 8.7* 8.8* 9.1* 9.5*  HCT 29.7* 27.5* 29.4* 29.7* 31.8*  MCV 97.1 91.1 95.1 95.5 94.9  PLT 286 259 254 256 628   Basic Metabolic Panel: Recent Labs  Lab 10/25/19 0535 10/26/19 0705 10/28/19 0239 10/29/19 0147 10/30/19 0111  NA 135 135 139 140 139  K 3.9 3.4* 3.7 3.9 3.9  CL 91* 94* 100 98 100  CO2 23 27 26 26 28   GLUCOSE 115* 163* 94 134* 120*  BUN 33* 45* 42* 60* 39*  CREATININE 6.38* 8.65* 8.30* 9.69* 5.95*  CALCIUM 8.6* 8.2* 8.6* 8.7* 9.0  MG  --   --  2.1  --   --   PHOS  --  7.3* 5.8*  --   --    GFR: Estimated Creatinine Clearance: 11.9 mL/min (A) (by C-G formula based on SCr of 5.95 mg/dL (H)). Liver Function Tests: Recent Labs  Lab 10/24/19 1107 10/26/19 0705 10/28/19 0239 10/29/19 0147  AST 23  --  19 16  ALT 14  --  12 12  ALKPHOS 55  --  52 62  BILITOT 0.8  --  0.6 0.5  PROT 6.9  --  6.4* 5.9*  ALBUMIN 3.3* 2.8* 2.9* 2.7*   No results for input(s): LIPASE, AMYLASE in the last 168 hours. Recent Labs  Lab 10/28/19 0239 10/29/19 0147  AMMONIA 35 26   Coagulation Profile: No results for input(s): INR, PROTIME in the last 168 hours. Cardiac Enzymes: No results for input(s): CKTOTAL, CKMB, CKMBINDEX, TROPONINI in the last 168 hours. BNP (last 3 results) No results for input(s): PROBNP in the last 8760 hours. HbA1C: No results for input(s): HGBA1C in the last 72 hours. CBG: Recent Labs  Lab 10/24/19 1012 10/25/19 0121 10/26/19 2159 10/27/19 0643 10/27/19 1225  GLUCAP 70 77 93 98 116*   Lipid Profile: No results for input(s): CHOL, HDL, LDLCALC, TRIG, CHOLHDL, LDLDIRECT in the last 72 hours. Thyroid Function Tests: Recent Labs    10/29/19 0147  TSH 0.422   Anemia Panel: Recent Labs    10/29/19 0147  VITAMINB12 739  FOLATE 20.8    Sepsis Labs: No results for input(s): PROCALCITON, LATICACIDVEN in the last 168 hours.  Recent Results (from the past 240 hour(s))  Respiratory Panel by RT PCR (Flu A&B, Covid) - Nasopharyngeal Swab     Status: None   Collection Time: 10/24/19  2:46 PM   Specimen: Nasopharyngeal Swab  Result Value Ref Range Status   SARS Coronavirus 2 by RT PCR NEGATIVE NEGATIVE Final    Comment: (NOTE) SARS-CoV-2 target nucleic acids are NOT DETECTED.  The SARS-CoV-2 RNA is generally detectable in upper respiratoy specimens during the acute phase of infection. The lowest concentration of SARS-CoV-2 viral copies this assay can detect is 131 copies/mL. A negative result does not preclude SARS-Cov-2 infection and should not be used as the sole basis for treatment or other patient management decisions. A negative result may occur with  improper specimen collection/handling, submission of specimen other than nasopharyngeal swab, presence of viral mutation(s) within the areas targeted by this assay, and inadequate number of viral copies (<  131 copies/mL). A negative result must be combined with clinical observations, patient history, and epidemiological information. The expected result is Negative.  Fact Sheet for Patients:  PinkCheek.be  Fact Sheet for Healthcare Providers:  GravelBags.it  This test is no t yet approved or cleared by the [place] FDA and  has been authorized for detection and/or diagnosis of SARS-CoV-2 by FDA under an Emergency Use Authorization (EUA). This EUA will remain  in effect (meaning this test can be used) for the duration of the COVID-19 declaration under Section 564(b)(1) of the Act, 21 U.S.C. section 360bbb-3(b)(1), unless the authorization is terminated or revoked sooner.     Influenza A by PCR NEGATIVE NEGATIVE Final   Influenza B by PCR NEGATIVE NEGATIVE Final    Comment: (NOTE) The Xpert Xpress  SARS-CoV-2/FLU/RSV assay is intended as an aid in  the diagnosis of influenza from Nasopharyngeal swab specimens and  should not be used as a sole basis for treatment. Nasal washings and  aspirates are unacceptable for Xpert Xpress SARS-CoV-2/FLU/RSV  testing.  Fact Sheet for Patients: PinkCheek.be  Fact Sheet for Healthcare Providers: GravelBags.it  This test is not yet approved or cleared by the [place] FDA and  has been authorized for detection and/or diagnosis of SARS-CoV-2 by  FDA under an Emergency Use Authorization (EUA). This EUA will remain  in effect (meaning this test can be used) for the duration of the  Covid-19 declaration under Section 564(b)(1) of the Act, 21  U.S.C. section 360bbb-3(b)(1), unless the authorization is  terminated or revoked. Performed at [facility], [place] [address] [place], [place] [address]     Scheduled Meds: . allopurinol  100 mg Oral Daily  . amLODipine  5 mg Oral Daily  . apixaban  5 mg Oral BID  . Chlorhexidine Gluconate Cloth  6 each Topical Q0600  . [START ON 10/31/2019] darbepoetin (ARANESP) injection - DIALYSIS  150 mcg Intravenous Q Thu-HD  . divalproex  250 mg Oral Q12H  . doxercalciferol  3 mcg Intravenous Q T,Th,Sa-HD  . feeding supplement (NEPRO CARB STEADY)  237 mL Oral BID BM  . ferric citrate  210 mg Oral TID WC  . hydrALAZINE  25 mg Oral TID  . hydrocerin   Topical Daily  . melatonin  3 mg Oral QHS  . multivitamin  1 tablet Oral QHS  . nebivolol  10 mg Oral QHS  . OLANZapine zydis  10 mg Oral QHS  . tamsulosin  0.4 mg Oral QHS  . Vitamin D (Ergocalciferol)  50,000 Units Oral Weekly   Continuous Infusions:  Domenic Polite, MD [facility] 10/30/2019, 1:58 PM

## 2019-10-30 NOTE — TOC Initial Note (Signed)
Transition of Care [facility]) - Initial/Assessment Note    Patient Details  Name: Duane Ortiz MRN: [id number] Date of Birth: September 14, 1950  [facility] CM/SW Contact:    Marilu Favre, RN Phone Number: 10/30/2019, 3:40 PM  Clinical Narrative:                 Patient from home alone. PT recommending SNF if refusing HHPT and 24/7 supervision. Discussed with patient at bedside and son Ardelle Anton via phone [phone].Both voiced understanding. Patient  does not want SNF. Patient does live alone and will not have 24/7 hour supervision. Discussed home health services. Patient stated he is to tired on HD days for HHPT. NCM explained HHPT could be arranged on non dialysis days. Patient declining. Son [place] aware.   Patient has walker at home already. Needs 3 in1 NCM ordered with [facility]. 3 in1 will come to room prior to discharge.   [place] plans to provide transportation home at discharge.  Expected Discharge Plan: Home/Self Care Barriers to Discharge: Continued Medical Work up   Patient Goals and CMS Choice Patient states their goals for this hospitalization and ongoing recovery are:: to return to home CMS Medicare.gov Compare Post Acute Care list provided to:: Patient Choice offered to / list presented to : Patient  Expected Discharge Plan and Services Expected Discharge Plan: Home/Self Care In-house Referral: Clinical Social Work Discharge Planning Services: CM Consult Post Acute Care Choice: [place] arrangements for the past 2 months: [place]                 DME Arranged: 3-N-1 DME Agency: [facility] Date DME Agency Contacted: 10/30/19 Time DME Agency Contacted: [phone] Representative spoke with at DME Agency: [place]: Refused SNF, Patient Refused HH          Prior Living Arrangements/Services Living arrangements for the past 2 months: [place] Lives with:: Self Patient language and need for interpreter reviewed:: Yes Do  you feel safe going back to the place where you live?: Yes      Need for Family Participation in Patient Care: Yes (Comment) Care giver support system in place?: No (comment) Current home services: DME (walker) Criminal Activity/Legal Involvement Pertinent to Current Situation/Hospitalization: No - Comment as needed  Activities of Daily Living Home Assistive Devices/Equipment: None ADL Screening (condition at time of admission) Patient's cognitive ability adequate to safely complete daily activities?: Yes Is the patient deaf or have difficulty hearing?: No Does the patient have difficulty seeing, even when wearing glasses/contacts?: No Does the patient have difficulty concentrating, remembering, or making decisions?: No Patient able to express need for assistance with ADLs?: Yes Does the patient have difficulty dressing or bathing?: No Independently performs ADLs?: Yes (appropriate for developmental age) Does the patient have difficulty walking or climbing stairs?: Yes Weakness of Legs: Both Weakness of Arms/Hands: Both  Permission Sought/Granted Permission sought to share information with : Family Supports Permission granted to share information with : Yes, Verbal Permission Granted  Share Information with NAME: Dailan Pfalzgraf son [phone]     Permission granted to share info w Relationship: Sons  Permission granted to share info w Contact Information: Tamala Bari - [name] [phone]  Emotional Assessment Appearance:: Appears stated age Attitude/Demeanor/Rapport: Self-Confident Affect (typically observed): Blunt Orientation: : Oriented to Self, Oriented to Place, Oriented to  Time, Oriented to Situation Alcohol / Substance Use: Not Applicable Psych Involvement: No (comment)  Admission diagnosis:  Altered mental status,  unspecified altered mental status type [R41.82] AMS (altered mental status) [R41.82] Patient Active Problem List   Diagnosis Date Noted  . AMS  (altered mental status) 10/01/2019  . Acute encephalopathy 09/30/2019  . ESRD (end stage renal disease) ([place]) 09/21/2019  . Hyperkalemia, diminished renal excretion 09/21/2019  . Anemia 08/28/2019  . Disorder of mineral metabolism, unspecified 08/28/2019  . Hyperkalemia 08/28/2019  . Hypomagnesemia 08/28/2019  . Metabolic acidosis 10/93/2355  . Mycosis 08/28/2019  . Tinea pedis 08/28/2019  . Peripheral neuropathy 04/02/2019  . Coagulation defect ([place]) 07/12/2018  . History of pulmonary embolism 07/12/2018  . Insomnia 07/12/2018  . Lumbar disc disease with radiculopathy 07/12/2018  . GERD (gastroesophageal reflux disease) 04/24/2018  . 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 Allergic rhinitis 03/01/2018  . Unstable gait 03/01/2018  . Chronic lower back pain 02/13/2018  . Gout involving toe of left foot 02/13/2018  . History of alcohol abuse 02/13/2018  . Overweight (BMI 25.0-29.9) 02/13/2018  . Vitamin D deficiency 02/13/2018  . Essential hypertension 04/27/2017  . Type 2 diabetes mellitus with renal complication ([place]) 73/22/0254  . Gait abnormality 10/11/2016  . Diabetic peripheral neuropathy ([place]) 08/25/2016  . Disc degeneration, lumbar 08/25/2016  . 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 Dyspnea on exertion 05/22/2015  . Hypercholesteremia   . Gout   . Pulmonary embolism ([place])   . Chronic kidney disease   . Hypercholesterolemia    PCP:  Sandi Mariscal, MD Pharmacy:   [facility] [place], [place] [address] Phone: [phone] Fax: [phone]     Social Determinants of Health (SDOH) Interventions    Readmission Risk Interventions Readmission Risk Prevention Plan 10/03/2019  Transportation Screening Complete  PCP or Specialist Appt within 3-5 Days Not Complete  HRI or [place] Complete  Social Work Consult for [place] Planning/Counseling Complete  Palliative Care Screening Not Applicable  Medication Review Press photographer) Complete  Some recent data might be hidden

## 2019-10-31 DIAGNOSIS — Z515 Encounter for palliative care: Secondary | ICD-10-CM

## 2019-10-31 DIAGNOSIS — R4182 Altered mental status, unspecified: Secondary | ICD-10-CM | POA: Diagnosis not present

## 2019-10-31 DIAGNOSIS — Z66 Do not resuscitate: Secondary | ICD-10-CM

## 2019-10-31 LAB — BASIC METABOLIC PANEL
Anion gap: 16 — ABNORMAL HIGH (ref 5–15)
BUN: 68 mg/dL — ABNORMAL HIGH (ref 8–23)
CO2: 22 mmol/L (ref 22–32)
Calcium: 9.3 mg/dL (ref 8.9–10.3)
Chloride: 100 mmol/L (ref 98–111)
Creatinine, Ser: 8.08 mg/dL — ABNORMAL HIGH (ref 0.61–1.24)
GFR, Estimated: 7 mL/min — ABNORMAL LOW (ref >60)
Glucose, Bld: 96 mg/dL (ref 70–99)
Potassium: 4.2 mmol/L (ref 3.5–5.1)
Sodium: 138 mmol/L (ref 135–145)

## 2019-10-31 LAB — CBC
HCT: 32.2 % — ABNORMAL LOW (ref 39.0–52.0)
Hemoglobin: 9.7 g/dL — ABNORMAL LOW (ref 13.0–17.0)
MCH: 28.7 pg (ref 26.0–34.0)
MCHC: 30.1 g/dL (ref 30.0–36.0)
MCV: 95.3 fL (ref 80.0–100.0)
Platelets: 260 10*3/uL (ref 150–400)
RBC: 3.38 MIL/uL — ABNORMAL LOW (ref 4.22–5.81)
RDW: 14.9 % (ref 11.5–15.5)
WBC: 7.2 10*3/uL (ref 4.0–10.5)
nRBC: 0 % (ref 0.0–0.2)

## 2019-10-31 MED ORDER — DARBEPOETIN ALFA 150 MCG/0.3ML IJ SOSY
PREFILLED_SYRINGE | INTRAMUSCULAR | Status: AC
  Filled 2019-10-31: qty 0.3

## 2019-10-31 MED ORDER — DOXERCALCIFEROL 4 MCG/2ML IV SOLN
INTRAVENOUS | Status: AC
  Administered 2019-10-31: 3 ug via INTRAVENOUS
  Filled 2019-10-31: qty 2

## 2019-10-31 NOTE — Progress Notes (Signed)
OT Cancellation Note  Patient Details Name: Duane Ortiz MRN: [id number] DOB: Mar 01, 1950   Cancelled Treatment:    Reason Eval/Treat Not Completed: Other (comment);Patient declined, no reason specified Pt reports he is waiting to go to HD and doesn't want to get OOB for participate in ADLs. Will check back for OT session as time allows.  Lanier Clam., COTA/L Acute Rehabilitation Services [phone] [place] 10/31/2019, 10:25 AM

## 2019-10-31 NOTE — Progress Notes (Signed)
Renal Navigator called patient's clinic to ask that they speak with patient about changing to a later seat time if they have a later seat become available in the future, as perhaps this will help increase compliance. Patient currently has a very early first shift chair.  Navigator called patient's son/Tyrone at [phone], who I have spoken with in the past to check and see if he can identify any barriers to patient going to HD treatment. He reports that his dad, "just doesn't want to do dialysis and no one is going to be able to make him do what he does not want to do." Navigator explained that no one can force his father to do HD treatment if he does not want to, but that this shift the care in to a different direction. Tyrone recalls meeting with Palliative Care NP and states his dad is going to do whatever he wants. He also states that the move to [place] may not be imminent, but is something the family is evaluating. Navigator explained that if patient wants to live in [place], he must decide if he wants to live in [place] first, or he won't make it there. Son understands this and asks that Navigator speak with his father. Navigator stated plans to.  Navigator met with patient at bedside to acknowledge hospitalizations for missed dialysis since starting in September when patient and Navigator first met. Navigator asked how dialysis is going for patient and he states it is fine. He does not think he is missing treatments, other than one time when [facility] called him to say that they were having trouble with their [facility] and could not come get him. Navigator asked if he could think of a back up plan to get to dialysis in the event this ever happened again or if he ever missed the [place]. Patient states it is too early in the morning to call anyone to take him to dialysis. Navigator asked if he thinks a later seat would help with compliance. He declined, stating that he likes his early seat.  He reports that he is "up at 4:30am watching the news anyway." He wishes to keep his early seat time, but Navigator mentioned that the clinic may evaluate this with him in the future. Navigator asked if, other than the one time the [facility] did not come, if transportation is going ok. Patient states that "they have good help over there, but twice they did not get me off in time." He stated that the clinic was training new staff and his treatment did not start in time because the new staff was not able to cannulate him on their first "5 tries." He states that on two occasions, he missed his [facility] home because his treatment went past his pick up time. He states being upset that he had to pay for an uber home. Patient states that he may be interested in moving "home" to [place], but that this is not something that will happen in the immediate future, just something they are thinking about. He asked Navigator, "who do I talk to about getting a kidney." Navigator explained that he should ask questions about getting on a transplant list to his providers at his clinic, but that HD treatment compliance is extremely important when being evaluated for a transplant. He continues to think that he is not missing treatments. Navigator continued to encourage him to go to every scheduled HD treatment. He agrees, stating he wants to continue dialysis.  Patient states he would like some numbing spray. He thought he was going to be able to get this, but never did. Navigator to follow up. Navigator contacted [facility] to see about his pick up windows and see if he has a standing order-confirmed. Navigator wants to ensure that his transportation drop off and pick up are far enough apart to allow for his treatment. Navigator informed [facility] that patient will be home tomorrow and need pick up starting again on Saturday. Navigator called his clinic and they confirmed that what he said occurred did in fact happen and that they have  addressed the problem so that it will not happen again. Clinic staff states his pick up windows are fine. RN checking on availability of numbing spray for patient-Navigator appreciates.  Renal Navigator called patient's son Jiles Prows back to follow up regarding conversation with his father. Jiles Prows was Patent attorney. Tyrone informed by Navigator that at no time did his father state not wanting to go to dialysis. Education on importance of compliance given and increased communication between patient, family, hospital, and clinic accomplished. Navigator suggests that patient may benefit from increased support by family at discharged and asked if Jiles Prows could check on him more often. Jiles Prows states that he usually checks in with his dad 1-2 times per week. He agrees to go see his dad more often, as he thinks this is a good idea.  Alphonzo Cruise, [place] Renal Navigator [phone]

## 2019-10-31 NOTE — Progress Notes (Addendum)
Patient ID: Duane Ortiz, male   DOB: Oct 24, 1950, 70 y.o.   MRN: [id number]  PROGRESS NOTE    [name]  SUP:[id number] DOB: 02/20/1950 DOA: 10/24/2019 PCP: Sandi Mariscal, MD   Brief Narrative:  70 year old male with history of ESRD and hemodialysis, hypertension, PE on Eliquis presented with altered mental status, bizarre behavior and wandering in the street.  Patient was recently started on hemodialysis on 9/18.  According to the family, he lives alone, his sons check on him every day.  Apparently he missed the shuttle to go to dialysis on Tuesday.  Patient was wandering around the neighborhood.  Son found him outside the house totally confused so brought to the ER.  Apparently does have history of insomnia and some cognitive dysfunction in the past. In the emergency room he was confused otherwise stable.  Underwent emergent dialysis.  Subsequently mental status improved. He became agitated overnight of 10/26/2019/early morning of 10/27/2019 for which he required restraints.  PT recommended SNF placement.  Assessment & Plan:   Acute metabolic encephalopathy Agitation/delirium Cognitive dysfunction -Patient presented with altered mental status probably due to uremia from missed hemodialysis requiring emergent hemodialysis and resolution of symptoms -Subsequently developed agitation  overnight early morning of 10/24 requiring restraints and IV Haldol, started on Depakote and Zyprexa -No infectious process identified, imaging studies including CT brain unremarkable -Suspected to have a component of cognitive dysfunction/early dementia  -Mental status is improved and is now stable however with cognitive deficits -PT recommends SNF placement.  Social worker consulted -Discharge planning, also limited by poor social/family support, they are contemplating moving to [place] -Patient declines SNF, will be set up with home health services, will discuss with family for importance of increased  supervision  End-stage renal disease on hemodialysis -Nephrology following. Dialysis today  Anemia of chronic disease -Stable, continue EPO with HD  Hypertension -Continue amlodipine, hydralazine, nebivolol and tamsulosin  History of PE -Continue Eliquis  Social: - Apparently lives alone with recently worsening cognition, missing hemodialysis.  Family is looking for more supervised care.  According to the patient's son, they are exploring options for him to go to a nursing home in [place]. -Social worker consulted for placement, now declines SNF -Home health being considered again   DVT prophylaxis: Eliquis Code Status: DNR Family Communication: No family at bedside Disposition Plan: Status is: Inpatient  Remains inpatient appropriate because:Inpatient level of care appropriate due to severity of illness   Dispo:  Patient From: Home  Planned Disposition: Home with home health services tomorrow, declines SNF  Expected discharge date: 10/29  medically stable for discharge: Yes  Consultants: Nephrology/palliative care  Procedures: None  Antimicrobials: None   Subjective: -Laying in bed, complains of itching, no other events overnight, continues to have cognitive deficits Objective: Vitals:   10/30/19 1709 10/30/19 2110 10/31/19 0533 10/31/19 0934  BP: (!) 145/61 (!) 144/54 (!) 136/52 140/62  Pulse: 78 71 68 68  Resp: 18 18 18 18   Temp: 98.6 F (37 C) 98.7 F (37.1 C) 98.8 F (37.1 C) 98.4 F (36.9 C)  TempSrc: Oral Oral Oral Oral  SpO2: 97% 95% 97% 98%  Weight:  85.9 kg    Height:        Intake/Output Summary (Last 24 hours) at 10/31/2019 1043 Last data filed at 10/31/2019 0900 Gross per 24 hour  Intake 740 ml  Output 0 ml  Net 740 ml   Filed Weights   10/29/19 0810 10/29/19 1214 10/30/19 2110  Weight: 87.9 kg  85.9 kg 85.9 kg    Examination:  Gen: Elderly frail male laying in bed, awake alert oriented to self, place and partly to time,  mild cognitive deficits noted HEENT: No JVD CVS: Decreased breath sounds to bases Abdomen: Soft, nontender Extremities: Trace edema, left arm AV graft noted  Data Reviewed: I have personally reviewed following labs and imaging studies  CBC: Recent Labs  Lab 10/24/19 1107 10/24/19 1107 10/26/19 0704 10/28/19 0239 10/29/19 0147 10/30/19 0111 10/31/19 0150  WBC 9.1   < > 6.4 6.6 6.4 7.3 7.2  NEUTROABS 6.6  --   --   --  3.9 4.8  --   HGB 8.6*   < > 8.7* 8.8* 9.1* 9.5* 9.7*  HCT 29.7*   < > 27.5* 29.4* 29.7* 31.8* 32.2*  MCV 97.1   < > 91.1 95.1 95.5 94.9 95.3  PLT 286   < > 259 254 256 268 260   < > = values in this interval not displayed.   Basic Metabolic Panel: Recent Labs  Lab 10/26/19 0705 10/28/19 0239 10/29/19 0147 10/30/19 0111 10/31/19 0150  NA 135 139 140 139 138  K 3.4* 3.7 3.9 3.9 4.2  CL 94* 100 98 100 100  CO2 27 26 26 28 22   GLUCOSE 163* 94 134* 120* 96  BUN 45* 42* 60* 39* 68*  CREATININE 8.65* 8.30* 9.69* 5.95* 8.08*  CALCIUM 8.2* 8.6* 8.7* 9.0 9.3  MG  --  2.1  --   --   --   PHOS 7.3* 5.8*  --   --   --    GFR: Estimated Creatinine Clearance: 8.8 mL/min (A) (by C-G formula based on SCr of 8.08 mg/dL (H)). Liver Function Tests: Recent Labs  Lab 10/24/19 1107 10/26/19 0705 10/28/19 0239 10/29/19 0147  AST 23  --  19 16  ALT 14  --  12 12  ALKPHOS 55  --  52 62  BILITOT 0.8  --  0.6 0.5  PROT 6.9  --  6.4* 5.9*  ALBUMIN 3.3* 2.8* 2.9* 2.7*   No results for input(s): LIPASE, AMYLASE in the last 168 hours. Recent Labs  Lab 10/28/19 0239 10/29/19 0147  AMMONIA 35 26   Coagulation Profile: No results for input(s): INR, PROTIME in the last 168 hours. Cardiac Enzymes: No results for input(s): CKTOTAL, CKMB, CKMBINDEX, TROPONINI in the last 168 hours. BNP (last 3 results) No results for input(s): PROBNP in the last 8760 hours. HbA1C: No results for input(s): HGBA1C in the last 72 hours. CBG: Recent Labs  Lab 10/25/19 0121  10/26/19 2159 10/27/19 0643 10/27/19 1225  GLUCAP 77 93 98 116*   Lipid Profile: No results for input(s): CHOL, HDL, LDLCALC, TRIG, CHOLHDL, LDLDIRECT in the last 72 hours. Thyroid Function Tests: Recent Labs    10/29/19 0147  TSH 0.422   Anemia Panel: Recent Labs    10/29/19 0147  VITAMINB12 739  FOLATE 20.8   Sepsis Labs: No results for input(s): PROCALCITON, LATICACIDVEN in the last 168 hours.  Recent Results (from the past 240 hour(s))  Respiratory Panel by RT PCR (Flu A&B, Covid) - Nasopharyngeal Swab     Status: None   Collection Time: 10/24/19  2:46 PM   Specimen: Nasopharyngeal Swab  Result Value Ref Range Status   SARS Coronavirus 2 by RT PCR NEGATIVE NEGATIVE Final    Comment: (NOTE) SARS-CoV-2 target nucleic acids are NOT DETECTED.  The SARS-CoV-2 RNA is generally detectable in upper respiratoy specimens during the acute  phase of infection. The lowest concentration of SARS-CoV-2 viral copies this assay can detect is 131 copies/mL. A negative result does not preclude SARS-Cov-2 infection and should not be used as the sole basis for treatment or other patient management decisions. A negative result may occur with  improper specimen collection/handling, submission of specimen other than nasopharyngeal swab, presence of viral mutation(s) within the areas targeted by this assay, and inadequate number of viral copies (<131 copies/mL). A negative result must be combined with clinical observations, patient history, and epidemiological information. The expected result is Negative.  Fact Sheet for Patients:  PinkCheek.be  Fact Sheet for Healthcare Providers:  GravelBags.it  This test is no t yet approved or cleared by the [place] FDA and  has been authorized for detection and/or diagnosis of SARS-CoV-2 by FDA under an Emergency Use Authorization (EUA). This EUA will remain  in effect (meaning this  test can be used) for the duration of the COVID-19 declaration under Section 564(b)(1) of the Act, 21 U.S.C. section 360bbb-3(b)(1), unless the authorization is terminated or revoked sooner.     Influenza A by PCR NEGATIVE NEGATIVE Final   Influenza B by PCR NEGATIVE NEGATIVE Final    Comment: (NOTE) The Xpert Xpress SARS-CoV-2/FLU/RSV assay is intended as an aid in  the diagnosis of influenza from Nasopharyngeal swab specimens and  should not be used as a sole basis for treatment. Nasal washings and  aspirates are unacceptable for Xpert Xpress SARS-CoV-2/FLU/RSV  testing.  Fact Sheet for Patients: PinkCheek.be  Fact Sheet for Healthcare Providers: GravelBags.it  This test is not yet approved or cleared by the [place] FDA and  has been authorized for detection and/or diagnosis of SARS-CoV-2 by  FDA under an Emergency Use Authorization (EUA). This EUA will remain  in effect (meaning this test can be used) for the duration of the  Covid-19 declaration under Section 564(b)(1) of the Act, 21  U.S.C. section 360bbb-3(b)(1), unless the authorization is  terminated or revoked. Performed at [facility], [place] [address], [place], [place] [address]     Scheduled Meds: . allopurinol  100 mg Oral Daily  . amLODipine  5 mg Oral Daily  . apixaban  5 mg Oral BID  . Chlorhexidine Gluconate Cloth  6 each Topical Q0600  . darbepoetin (ARANESP) injection - DIALYSIS  150 mcg Intravenous Q Thu-HD  . divalproex  250 mg Oral Q12H  . doxercalciferol  3 mcg Intravenous Q T,Th,Sa-HD  . feeding supplement (NEPRO CARB STEADY)  237 mL Oral BID BM  . ferric citrate  210 mg Oral TID WC  . hydrALAZINE  25 mg Oral TID  . hydrocerin   Topical Daily  . melatonin  3 mg Oral QHS  . multivitamin  1 tablet Oral QHS  . nebivolol  10 mg Oral QHS  . OLANZapine zydis  10 mg Oral QHS  . tamsulosin  0.4 mg Oral QHS  . Vitamin D (Ergocalciferol)   50,000 Units Oral Weekly   Continuous Infusions:  Domenic Polite, MD [facility] 10/31/2019, 10:43 AM

## 2019-10-31 NOTE — Progress Notes (Signed)
PT Cancellation Note  Patient Details Name: Duane Ortiz MRN: [id number] DOB: 1950/06/11   Cancelled Treatment:    Reason Eval/Treat Not Completed: Patient at procedure or test/unavailable at HD- will follow and attempt to return if time/schedule allow.    Windell Norfolk, DPT, PN1   Supplemental Physical Therapist [facility]    Pager [phone] Acute Rehab Office [phone]

## 2019-10-31 NOTE — Progress Notes (Signed)
Wound care done to his Left great toe, tolerated good.

## 2019-10-31 NOTE — Progress Notes (Addendum)
[facility] Progress Note   Dialysis Orders: TTS [facility]- relatively new ESRD, started HD 09/26/19 4h 85.5kg 2/2.5 bath Hep 2000 AVG - hect 3 tiw - venofer 50 qwk - mircera 150 q 2, last 10/14  Assessment/ Plan:   1. AMS: Initially felt possibly d/t uremia with missed HD. Head CT negative, no obvious infections. Possibly just worsening dementia. Overall MS is clearer but with prn delirium. Placement pending. 2. ESRD: Continue HD perTTSschedule -next today (10/28). 3. Anemiaof ESRD: Hgb9.5- slow improvement. Will be due for ESA today. 4. Secondary hyperparathyroidism: Ca ok, Phos improving 7.3 -> 5.8 on 10/25. Continue hectorol+ Auryxia. 5.HTN/volume:BP variable, CXR without edema. Keeping same EDW for now.  6. Nutrition: Alb low, continue Nepro. 7. Dispo:PT recommends SNF and 24/7 assist for safety and rehab (currently lives alone and sons stop by in evenings. SW involved.  Subjective:   Seen in room while eating breakfast. No CP/dyspnea/ constipation/ fevers/ cough. Placement pending.   Objective:   BP (!) 136/52 (BP Location: Right Arm)   Pulse 68   Temp 98.8 F (37.1 C) (Oral)   Resp 18   Ht 5\' 7"  (1.702 m)   Wt 85.9 kg   SpO2 97%   BMI 29.66 kg/m   Intake/Output Summary (Last 24 hours) at 10/31/2019 0714 Last data filed at 10/31/2019 0533 Gross per 24 hour  Intake 740 ml  Output 100 ml  Net 640 ml   Weight change: -1.997 kg  Physical Exam: General:Well appearing man, NAD Heart:RRR; no murmur Lungs:CTA anteriorly Abdomen:soft, non-tender Extremities:NoLE edema Dialysis Access:LUA AVG + bruit   Imaging: No results found.  Labs: BMET Recent Labs  Lab 10/24/19 1107 10/25/19 0535 10/26/19 0705 10/28/19 0239 10/29/19 0147 10/30/19 0111 10/31/19 0150  NA 142 135 135 139 140 139 138  K 4.6 3.9 3.4* 3.7 3.9 3.9 4.2  CL 100 91* 94* 100 98 100 100  CO2 18* 23 27 26 26 28 22   GLUCOSE 75 115* 163* 94 134* 120* 96   BUN 102* 33* 45* 42* 60* 39* 68*  CREATININE 12.68* 6.38* 8.65* 8.30* 9.69* 5.95* 8.08*  CALCIUM 8.3* 8.6* 8.2* 8.6* 8.7* 9.0 9.3  PHOS  --   --  7.3* 5.8*  --   --   --    CBC Recent Labs  Lab 10/24/19 1107 10/26/19 0704 10/28/19 0239 10/29/19 0147 10/30/19 0111 10/31/19 0150  WBC 9.1   < > 6.6 6.4 7.3 7.2  NEUTROABS 6.6  --   --  3.9 4.8  --   HGB 8.6*   < > 8.8* 9.1* 9.5* 9.7*  HCT 29.7*   < > 29.4* 29.7* 31.8* 32.2*  MCV 97.1   < > 95.1 95.5 94.9 95.3  PLT 286   < > 254 256 268 260   < > = values in this interval not displayed.    Medications:    . allopurinol  100 mg Oral Daily  . amLODipine  5 mg Oral Daily  . apixaban  5 mg Oral BID  . Chlorhexidine Gluconate Cloth  6 each Topical Q0600  . darbepoetin (ARANESP) injection - DIALYSIS  150 mcg Intravenous Q Thu-HD  . divalproex  250 mg Oral Q12H  . doxercalciferol  3 mcg Intravenous Q T,Th,Sa-HD  . feeding supplement (NEPRO CARB STEADY)  237 mL Oral BID BM  . ferric citrate  210 mg Oral TID WC  . hydrALAZINE  25 mg Oral TID  . hydrocerin   Topical Daily  .  melatonin  3 mg Oral QHS  . multivitamin  1 tablet Oral QHS  . nebivolol  10 mg Oral QHS  . OLANZapine zydis  10 mg Oral QHS  . tamsulosin  0.4 mg Oral QHS  . Vitamin D (Ergocalciferol)  50,000 Units Oral Weekly      Otelia Santee, MD 10/31/2019, 7:14 AM

## 2019-10-31 NOTE — Plan of Care (Signed)
°  Problem: Education: Goal: Knowledge of General Education information will improve Description: Including pain rating scale, medication(s)/side effects and non-pharmacologic comfort measures Outcome: Progressing   Problem: Education: Goal: Knowledge of disease and its progression will improve Outcome: Progressing   Problem: Education: Goal: Individualized Educational Video(s) Outcome: Progressing    Problem: Fluid Volume: Goal: Compliance with measures to maintain balanced fluid volume will improve Outcome: Progressing   Problem: Health Behavior/Discharge Planning: Goal: Ability to manage health-related needs will improve Outcome: Progressing   Problem: Nutritional: Goal: Ability to make healthy dietary choices will improve Outcome: Progressing   Problem: Clinical Measurements: Goal: Complications related to the disease process, condition or treatment will be avoided or minimized Outcome: Progressing   Problem: Health Behavior/Discharge Planning: Goal: Ability to manage health-related needs will improve Outcome: Progressing   Problem: Clinical Measurements: Goal: Diagnostic test results will improve Outcome: Progressing   Problem: Clinical Measurements: Goal: Respiratory complications will improve Outcome: Progressing   Problem: Clinical Measurements: Goal: Cardiovascular complication will be avoided Outcome: Progressing   Problem: Activity: Goal: Risk for activity intolerance will decrease Outcome: Progressing   Problem: Nutrition: Goal: Adequate nutrition will be maintained Outcome: Progressing   Problem: Coping: Goal: Level of anxiety will decrease 10/31/2019 2354 by Suzy Bouchard, RN Outcome: Progressing 10/31/2019 2353 by Suzy Bouchard, RN Outcome: Progressing   Problem: Elimination: Goal: Will not experience complications related to bowel motility Outcome: Progressing   Problem: Elimination: Goal: Will not experience complications related  to urinary retention Outcome: Progressing   Problem: Pain Managment: Goal: General experience of comfort will improve 10/31/2019 2354 by Suzy Bouchard, RN Outcome: Progressing 10/31/2019 2353 by Suzy Bouchard, RN Outcome: Progressing   Problem: Safety: Goal: Ability to remain free from injury will improve 10/31/2019 2354 by Suzy Bouchard, RN Outcome: Progressing 10/31/2019 2353 by Suzy Bouchard, RN Outcome: Progressing   Problem: Skin Integrity: Goal: Risk for impaired skin integrity will decrease Outcome: Progressing

## 2019-10-31 NOTE — TOC Progression Note (Signed)
Transition of Care [facility]) - Progression Note    Patient Details  Name: Duane Ortiz MRN: [id number] Date of Birth: Jan 05, 1950  Transition of Care [facility]) CM/SW Contact  Bartholomew Crews, RN Phone Number: [phone] 10/31/2019, 10:39 AM  Clinical Narrative:     Spoke with patient's son, Duane Ortiz, on the phone to discuss transition plans. Discussed patient's concerns for [facility] PT is that he is too tired to participate in PT on dialysis days. Discussed that arrangements can be made for services on non-dialysis days. Tyrone thinks patient could benefit from PT services and would like to see [place] continued, and he understands that if patient does refuse to participate that [facility] will close the case. Patient will need HH PT orders at discharge for resumptions of services.   Patient has [facility] for dialysis transportation on TTS. He attends hemodialysis at [facility].   Previous NCM assisted with DME order and referral to [facility] for delivery of 3N1 to the room prior to discharge.   Patient's son, Duane Ortiz, to provide transportation home at time of discharge.   TOC following for transition needs.   Expected Discharge Plan: Home/Self Care Barriers to Discharge: Continued Medical Work up  Expected Discharge Plan and Services Expected Discharge Plan: Home/Self Care In-house Referral: Clinical Social Work Discharge Planning Services: CM Consult Post Acute Care Choice: [place] arrangements for the past 2 months: Single Family Home                 DME Arranged: 3-N-1 DME Agency: [facility] Date DME Agency Contacted: 10/30/19 Time DME Agency Contacted: [phone] Representative spoke with at DME Agency: [place]: PT [place]: [facility] Date [place]: 10/31/19 Time [place]: 66 Representative spoke with at [place]: [place] ([facility]) Interventions    Readmission Risk Interventions Readmission Risk Prevention  Plan 10/03/2019  Transportation Screening Complete  PCP or Specialist Appt within 3-5 Days Not Complete  HRI or [place] Complete  Social Work Consult for [place] Planning/Counseling Complete  Palliative Care Screening Not Applicable  Medication Review Press photographer) Complete  Some recent data might be hidden

## 2019-11-01 DIAGNOSIS — R4182 Altered mental status, unspecified: Secondary | ICD-10-CM | POA: Diagnosis not present

## 2019-11-01 NOTE — Progress Notes (Signed)
  [facility] Progress Note   Dialysis Orders: TTS [facility]- relatively new ESRD, started HD 09/26/19 4h 85.5kg 2/2.5 bath Hep 2000 AVG - hect 3 tiw - venofer 50 qwk - mircera 150 q 2, last 10/14  Assessment/ Plan:   1. AMS: Initially felt possibly d/t uremia with missed HD. Head CT negative, no obvious infections. Possibly just worsening dementia. Overall MS is clearer but with prn delirium; better this am. Placement pending. 2. ESRD: Continue HD perTTSschedule -  HD 10/28 net UF 2.5L  Next HD Sat  3. Anemiaof ESRD: Hgb9.5- slow improvement. Will be due forESA today. 4. Secondary hyperparathyroidism: Ca ok, Phos improving 7.3 -> 5.8 on 10/25. Continue hectorol+ Auryxia. 5.HTN/volume:BP variable, CXR without edema. Keeping same EDW for now.  6. Nutrition: Alb low, continue Nepro. 7. Dispo:PT recommends SNF and 24/7 assist for safety and rehab (currently lives alone and sons stop by in evenings. SW involved). Pt declines SNF; possibly home health.  Subjective:   Seen in room.   No CP/dyspnea/ constipation/ fevers/ cough.  Placement pending. He is much more alert this am.   Objective:   BP (!) 112/51 (BP Location: Right Arm)   Pulse 73   Temp 97.7 F (36.5 C) (Oral)   Resp 18   Ht 5\' 7"  (1.702 m)   Wt 83.2 kg   SpO2 96%   BMI 28.73 kg/m   Intake/Output Summary (Last 24 hours) at 11/01/2019 6734 Last data filed at 10/31/2019 2124 Gross per 24 hour  Intake 240 ml  Output 2500 ml  Net -2260 ml   Weight change: -0.203 kg  Physical Exam: General:Well appearing man, NAD Heart:RRR; no murmur Lungs:CTA anteriorly Abdomen:soft, non-tender Extremities:NoLE edema Dialysis Access:LUA AVG + bruit  Imaging: No results found.  Labs: BMET Recent Labs  Lab 10/26/19 0705 10/28/19 0239 10/29/19 0147 10/30/19 0111 10/31/19 0150  NA 135 139 140 139 138  K 3.4* 3.7 3.9 3.9 4.2  CL 94* 100 98 100 100  CO2 27 26 26 28 22   GLUCOSE  163* 94 134* 120* 96  BUN 45* 42* 60* 39* 68*  CREATININE 8.65* 8.30* 9.69* 5.95* 8.08*  CALCIUM 8.2* 8.6* 8.7* 9.0 9.3  PHOS 7.3* 5.8*  --   --   --    CBC Recent Labs  Lab 10/28/19 0239 10/29/19 0147 10/30/19 0111 10/31/19 0150  WBC 6.6 6.4 7.3 7.2  NEUTROABS  --  3.9 4.8  --   HGB 8.8* 9.1* 9.5* 9.7*  HCT 29.4* 29.7* 31.8* 32.2*  MCV 95.1 95.5 94.9 95.3  PLT 254 256 268 260    Medications:    . allopurinol  100 mg Oral Daily  . amLODipine  5 mg Oral Daily  . apixaban  5 mg Oral BID  . Chlorhexidine Gluconate Cloth  6 each Topical Q0600  . darbepoetin (ARANESP) injection - DIALYSIS  150 mcg Intravenous Q Thu-HD  . doxercalciferol  3 mcg Intravenous Q T,Th,Sa-HD  . feeding supplement (NEPRO CARB STEADY)  237 mL Oral BID BM  . ferric citrate  210 mg Oral TID WC  . hydrALAZINE  25 mg Oral TID  . hydrocerin   Topical Daily  . melatonin  3 mg Oral QHS  . multivitamin  1 tablet Oral QHS  . nebivolol  10 mg Oral QHS  . tamsulosin  0.4 mg Oral QHS  . Vitamin D (Ergocalciferol)  50,000 Units Oral Weekly      Otelia Santee, MD 11/01/2019, 9:28 AM

## 2019-11-01 NOTE — Progress Notes (Signed)
PT Cancellation Note  Patient Details Name: Duane Ortiz MRN: [id number] DOB: [birth year]   Cancelled Treatment:    Reason Eval/Treat Not Completed: Patient declined, no reason specified attempted to see patient- he was sitting at side of bed and gave multiple, not logical reasons as to why he won't do therapy today including "I had HD yesterday and I'm gonna have it tomorrow" and "I can walk, I won't lose strength getting on that HD bus" despite mobility with therapy honestly being unsteady and unsafe. Unable to convince him to participate today. He reports his son is coming to pick him up soon. Will attempt to return if time/schedule allow.    Windell Norfolk, DPT, PN1   Supplemental Physical Therapist [facility]    Pager [phone] Acute Rehab Office [phone]

## 2019-11-01 NOTE — Progress Notes (Signed)
OT Cancellation Note  Patient Details Name: Duane Ortiz MRN: [id number] DOB: [birth year]   Cancelled Treatment:    Reason Eval/Treat Not Completed: Other (comment);Patient declined, no reason specified (Refusing OOB, "I can do it myself") Adamantly refusing therapy at this time, "I can do it myself because all you have is yourself". Therapy will continue to attempt however this is cancellation number 2 for this admission and may sign off for lack of participation.   Corinne Ports E. Charitie Hinote, COTA/L Acute Rehabilitation Services [phone] [place] 11/01/2019, 12:28 PM

## 2019-11-01 NOTE — Progress Notes (Signed)
PT Cancellation Note  Patient Details Name: Duane Ortiz MRN: [id number] DOB: June 27, 1950   Cancelled Treatment:    Reason Eval/Treat Not Completed: Patient declined, no reason specified made second attempt with OT, patient still highly resistant to therapy and refusing.    Windell Norfolk, DPT, PN1   Supplemental Physical Therapist [facility]    Pager [phone] Acute Rehab Office [phone]

## 2019-11-01 NOTE — Care Management Important Message (Signed)
Important Message  Patient Details  Name: Duane Ortiz MRN: [id number] Date of Birth: Feb 03, 1950   Medicare Important Message Given:  Yes - Important Message mailed due to current National Emergency  Verbal consent obtained due to current National Emergency  Relationship to patient: Self Contact Name: [place]ola Depaz Call Date: 11/01/19  Time: 1041 Phone: [phone] Outcome: No Answer/Busy Important Message mailed to: Patient address on file    Delorse Lek 11/01/2019, 10:41 AM

## 2019-11-01 NOTE — Progress Notes (Signed)
DISCHARGE NOTE HOME Duane Ortiz to be discharged Home per MD order. Discussed prescriptions and follow up appointments with the patient. Prescriptions given to patient; medication list explained in detail. Patient verbalized understanding.  Skin clean, dry and intact without evidence of skin break down, no evidence of skin tears noted. IV catheter discontinued intact. Site without signs and symptoms of complications. Dressing and pressure applied. Pt denies pain at the site currently. No complaints noted.  Patient free of lines, drains, and wounds.   An After Visit Summary (AVS) was printed and given to the patient. Patient escorted via wheelchair, and discharged home via private auto.  Berneta Levins, RN

## 2019-11-02 ENCOUNTER — Telehealth: Payer: Self-pay | Admitting: Nephrology

## 2019-11-02 NOTE — Telephone Encounter (Signed)
Transition of care contact from inpatient facility  Date of discharge: 11/01/19 Date of contact:  11/02/19 Method: Phone Spoke to: Patient's son [name].  Patient contacted to discuss transition of care from recent inpatient hospitalization. Patient was admitted to [facility] from 10/21-10/29/21  with discharge diagnosis of Acute metabolic encephalopathy likely secondary to missed dialysis.    Medication changes were reviewed.  Patient will follow up with his/her outpatient HD unit on: They aren't sure he went to dialysis today. Son has not been able to get in touch with him today. Advised to call dialysis center and reschedule treatment for Monday if he did miss the treatment.

## 2019-11-14 NOTE — Discharge Summary (Signed)
Physician Discharge Summary  Duane Ortiz HDQ:[id number] DOB: 1950/06/29 DOA: 10/24/2019  PCP: Sandi Mariscal, MD  Admit date: 10/24/2019 Discharge date: 11/14/2019  Time spent: 35 minutes  Recommendations for Outpatient Follow-up:  1. PCP in 1 week 2. Palliative care follow-up at home 3. Home health services   Discharge Diagnoses:  Active Problems: Acute metabolic encephalopathy Hospital delirium Cognitive dysfunction Anemia of chronic disease Essential hypertension History of pulmonary embolism   ESRD (end stage renal disease) on dialysis ([place])   AMS (altered mental status)   Palliative care by specialist   DNR (do not resuscitate)   Discharge Condition: Improved  Diet recommendation: Renal diet  Filed Weights   10/31/19 1223 10/31/19 1629 10/31/19 2124  Weight: 85.7 kg 83.2 kg 83.2 kg    History of present illness:  69 year old male with history of ESRD and hemodialysis, hypertension, PE on Eliquis presented with altered mental status, bizarre behavior and wandering in the street. Patient was recently started on hemodialysis on 9/18. According to the family, he lives alone, his sons check on him every day. Apparently he missed the shuttle to go to dialysis on Tuesday. Patient was wandering around the neighborhood. Son found him outside the house totally confused so brought to the ER. Apparently does have history of insomnia and some cognitive dysfunction in the past. In the emergency room he was confused otherwise stable. Underwent emergent dialysis  Hospital Course:   Acute metabolic encephalopathy Agitation/delirium Cognitive dysfunction -Patient presented with altered mental status probably due to uremia from missed hemodialysis requiring emergent hemodialysis and resolution of symptoms -Subsequently developed agitation  overnight early morning of 10/24 requiring restraints and IV Haldol, started on Depakote and Zyprexa -No infectious process identified,  imaging studies including CT brain unremarkable -Suspected to have a component of cognitive dysfunction/early dementia  -Mental status is improved and is now stable however with cognitive deficits -Gradually weaned off Depakote and Zyprexa -PT recommended SNF placement.  Social worker consulted -After much discussion with patient and son he now declines SNF, will be set up with home health services, importance of increased supervision emphasized to family  End-stage renal disease on hemodialysis -Followed by nephrology for inpatient dialysis  Anemia of chronic disease -Stable, continue EPO with HD  Hypertension -Continue amlodipine, hydralazine, nebivolol and tamsulosin  History of PE -Continue Eliquis  Social: -Apparently lives alone with recently worsening cognition, missing hemodialysis. Family is looking for more supervised care. According to the patient's son, they are exploring options for him to go to a nursing home in [place]. -Social worker consulted for placement, now declines SNF -Home with home health set up at discharge, also emphasized need for increased supervision due to cognitive deficits -Also seen by palliative care this admission, now DNR, plan for palliative follow-up after discharge   Code Status: DNR   Discharge Exam: Vitals:   11/01/19 0527 11/01/19 0848  BP: (!) 100/29 (!) 112/51  Pulse: 70 73  Resp: 18 18  Temp: 98.4 F (36.9 C) 97.7 F (36.5 C)  SpO2: 97% 96%    General: Awake alert oriented to self and place, cognitive deficits noted Cardiovascular: S1-S2, regular rate rhythm Respiratory: Clear  Discharge Instructions   Discharge Instructions    Discharge instructions   Complete by: As directed    Renal Diet   Discharge wound care:   Complete by: As directed    As above   Increase activity slowly   Complete by: As directed      Allergies as of  11/01/2019      Reactions   Baclofen Other (See Comments)   confusion    Gabapentin    Felt like I was out of my head, slept all day, confusion    Sulfa Antibiotics Itching      Medication List    STOP taking these medications   cloNIDine 0.1 MG tablet Commonly known as: CATAPRES   oxyCODONE-acetaminophen 5-325 MG tablet Commonly known as: Percocet   sodium bicarbonate 650 MG tablet     TAKE these medications   allopurinol 100 MG tablet Commonly known as: ZYLOPRIM Take 100 mg by mouth daily.   amLODipine 5 MG tablet Commonly known as: NORVASC Take 1 tablet (5 mg total) by mouth daily.   apixaban 5 MG Tabs tablet Commonly known as: ELIQUIS Take 1 tablet (5 mg total) by mouth 2 (two) times daily.   Auryxia 1 GM 210 MG(Fe) tablet Generic drug: ferric citrate Take 210 mg by mouth 3 (three) times daily with meals.   calcitRIOL 0.25 MCG capsule Commonly known as: ROCALTROL Take 0.25 mcg by mouth daily.   ergocalciferol 1.25 MG (50000 UT) capsule Commonly known as: VITAMIN D2 Take 50,000 Units by mouth once a week.   hydrALAZINE 25 MG tablet Commonly known as: APRESOLINE Take 1 tablet (25 mg total) by mouth 3 (three) times daily.   nebivolol 10 MG tablet Commonly known as: BYSTOLIC Take 10 mg by mouth at bedtime.   tamsulosin 0.4 MG Caps capsule Commonly known as: FLOMAX Take 0.4 mg by mouth at bedtime.            Discharge Care Instructions  (From admission, onward)         Start     Ordered   11/01/19 0000  Discharge wound care:       Comments: As above   11/01/19 0839         Allergies  Allergen Reactions  . Baclofen Other (See Comments)    confusion  . Gabapentin     Felt like I was out of my head, slept all day, confusion   . Sulfa Antibiotics Itching    Follow-up Information    [facility] Follow up.   Why: the office will call to schedule home health visits for physical therapy on non hemodialysis days Contact information: [address] [place], [place] [address] [phone]                The results of significant diagnostics from this hospitalization (including imaging, microbiology, ancillary and laboratory) are listed below for reference.    Significant Diagnostic Studies: CT HEAD WO CONTRAST  Result Date: 10/24/2019 CLINICAL DATA:  Mental status change of unknown cause. EXAM: CT HEAD WITHOUT CONTRAST TECHNIQUE: Contiguous axial images were obtained from the base of the skull through the vertex without intravenous contrast. COMPARISON:  09/30/2019 FINDINGS: Brain: No evidence of acute infarction, hemorrhage, hydrocephalus, extra-axial collection or mass lesion/mass effect. Confluent chronic small vessel ischemia in the cerebral white matter. Mild cerebral volume loss Vascular: Atherosclerotic calcification. Skull: Normal. Negative for fracture or focal lesion. Sinuses/Orbits: Negative IMPRESSION: 1. No acute finding. 2. Confluent chronic small vessel ischemia in the cerebral white matter. Electronically Signed   By: Monte Fantasia M.D.   On: 10/24/2019 10:40   DG Chest Port 1 View  Result Date: 10/24/2019 CLINICAL DATA:  Altered mental status.  Missed dialysis. EXAM: PORTABLE CHEST 1 VIEW COMPARISON:  09/30/2019 FINDINGS: Cardiomediastinal silhouette is accentuated by low lung volumes and  AP portable technique. Low lung volumes. No consolidation. No visible pleural effusions or pneumothorax. The visualized skeletal structures are unremarkable. Left upper arm vascular stent. IMPRESSION: No acute cardiopulmonary disease. Electronically Signed   By: Margaretha Sheffield MD   On: 10/24/2019 13:09    Microbiology: No results found for this or any previous visit (from the past 240 hour(s)).   Labs: Basic Metabolic Panel: No results for input(s): NA, K, CL, CO2, GLUCOSE, BUN, CREATININE, CALCIUM, MG, PHOS in the last 168 hours. Liver Function Tests: No results for input(s): AST, ALT, ALKPHOS, BILITOT, PROT, ALBUMIN in the last 168 hours. No results for input(s): LIPASE,  AMYLASE in the last 168 hours. No results for input(s): AMMONIA in the last 168 hours. CBC: No results for input(s): WBC, NEUTROABS, HGB, HCT, MCV, PLT in the last 168 hours. Cardiac Enzymes: No results for input(s): CKTOTAL, CKMB, CKMBINDEX, TROPONINI in the last 168 hours. BNP: BNP (last 3 results) No results for input(s): BNP in the last 8760 hours.  ProBNP (last 3 results) No results for input(s): PROBNP in the last 8760 hours.  CBG: No results for input(s): GLUCAP in the last 168 hours.     Signed:  Domenic Polite MD.  [facility] 11/14/2019, 2:43 PM

## 2020-06-19 ENCOUNTER — Telehealth: Payer: Self-pay

## 2020-06-19 ENCOUNTER — Ambulatory Visit: Payer: Medicare HMO | Admitting: Podiatry

## 2020-06-19 ENCOUNTER — Other Ambulatory Visit: Payer: Self-pay

## 2020-06-19 ENCOUNTER — Other Ambulatory Visit: Payer: Self-pay | Admitting: Podiatry

## 2020-06-19 ENCOUNTER — Ambulatory Visit (INDEPENDENT_AMBULATORY_CARE_PROVIDER_SITE_OTHER): Payer: Medicare HMO

## 2020-06-19 DIAGNOSIS — M869 Osteomyelitis, unspecified: Secondary | ICD-10-CM | POA: Diagnosis not present

## 2020-06-19 DIAGNOSIS — Z992 Dependence on renal dialysis: Secondary | ICD-10-CM

## 2020-06-19 DIAGNOSIS — M868X7 Other osteomyelitis, ankle and foot: Secondary | ICD-10-CM

## 2020-06-19 DIAGNOSIS — E1122 Type 2 diabetes mellitus with diabetic chronic kidney disease: Secondary | ICD-10-CM

## 2020-06-19 DIAGNOSIS — Z01818 Encounter for other preprocedural examination: Secondary | ICD-10-CM | POA: Diagnosis not present

## 2020-06-19 DIAGNOSIS — N186 End stage renal disease: Secondary | ICD-10-CM

## 2020-06-19 NOTE — Telephone Encounter (Signed)
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

## 2020-06-19 NOTE — Patient Instructions (Addendum)
DUE TO COVID-19 ONLY ONE VISITOR IS ALLOWED TO COME WITH YOU AND STAY IN THE WAITING ROOM ONLY DURING PRE OP AND PROCEDURE.   **NO VISITORS ARE ALLOWED IN THE SHORT STAY AREA OR RECOVERY ROOM!!**    Your procedure is scheduled on: Wednesday, 07-01-20   Report to [facility] Main  Entrance    Report to admitting at  9:45 AM   Call this number if you have problems the morning of surgery [phone]   Do not eat food :After Midnight.   May have liquids until 8:45 AM day of surgery  CLEAR LIQUID DIET  Foods Allowed                                                                     Foods Excluded  Water, Black Coffee and tea, regular and decaf               liquids that you cannot  Plain Jell-O in any flavor  (No red)                                     see through such as: Fruit ices (not with fruit pulp)                                      milk, soups, orange juice              Iced Popsicles (No red)                                      All solid food                                   Apple juices Sports drinks like Gatorade (No red) Lightly seasoned clear broth or consume(fat free) Sugar, honey syrup     Complete one G2 drink the morning of surgery at  the day of surgery. Complete by 0845 am.       The day of surgery:  Drink ONE (1) G2 by am the morning of surgery. Drink in one sitting. Do not sip.  This drink was given to you during your hospital  pre-op appointment visit. Nothing else to drink after completing the  Pre-Surgery G2.          If you have questions, please contact your surgeon's office.     Oral Hygiene is also important to reduce your risk of infection.                                    Remember - BRUSH YOUR TEETH THE MORNING OF SURGERY WITH YOUR REGULAR TOOTHPASTE   Do NOT smoke after Midnight   Take these medicines the morning of surgery with A SIP OF WATER:  NONE  DO NOT TAKE ANY ORAL DIABETIC MEDICATIONS DAY OF YOUR SURGERY  You may not have any metal on your body including hair pins, jewelry, and body piercing             Do not wear lotions, powders, cologne, or deodorant             Men may shave face and neck.   Do not bring valuables to the hospital. [place].   Contacts, dentures or bridgework may not be worn into surgery.      Patients discharged the day of surgery will not be allowed to drive home.  Special Instructions: Bring a copy of your healthcare power of attorney and living will documents  How to Manage Your Diabetes Before and After Surgery  Why is it important to control my blood sugar before and after surgery? Improving blood sugar levels before and after surgery helps healing and can limit problems. A way of improving blood sugar control is eating a healthy diet by:  Eating less sugar and carbohydrates  Increasing activity/exercise  Talking with your doctor about reaching your blood sugar goals High blood sugars (greater than 180 mg/dL) can raise your risk of infections and slow your recovery, so you will need to focus on controlling your diabetes during the weeks before surgery. Make sure that the doctor who takes care of your diabetes knows about your planned surgery including the date and location.  How do I manage my blood sugar before surgery? Check your blood sugar at least 4 times a day, starting 2 days before surgery, to make sure that the level is not too high or low. Check your blood sugar the morning of your surgery when you wake up and every 2 hours until you get to the Short Stay unit. If your blood sugar is less than 70 mg/dL, you will need to treat for low blood sugar: Do not take insulin. Treat a low blood sugar (less than 70 mg/dL) with  cup of clear juice (cranberry or apple), 4 glucose tablets, OR glucose gel. Recheck blood sugar in 15 minutes after treatment (to make sure it is greater than 70 mg/dL). If  your blood sugar is not greater than 70 mg/dL on recheck, call [phone] for further instructions. Report your blood sugar to the short stay nurse when you get to Short Stay.  If you are admitted to the hospital after surgery: Your blood sugar will be checked by the staff and you will probably be given insulin after surgery (instead of oral diabetes medicines) to make sure you have good blood sugar levels. The goal for blood sugar control after surgery is 80-180 mg/dL.    [facility] - Preparing for Surgery Before surgery, you can play an important role.  Because skin is not sterile, your skin needs to be as free of germs as possible.  You can reduce the number of germs on your skin by washing with CHG (chlorahexidine gluconate) soap before surgery.  CHG is an antiseptic cleaner which kills germs and bonds with the skin to continue killing germs even after washing. Please DO NOT use if you have an allergy to CHG or antibacterial soaps.  If your skin becomes reddened/irritated stop using the CHG and inform your nurse when you arrive at Short Stay. Do not shave (including legs and underarms) for at least 48 hours prior to the first CHG shower.  You may shave your face/neck.  Please follow these instructions carefully:  1.  Shower with CHG Soap the  night before surgery and the  morning of surgery.  2.  If you choose to wash your hair, wash your hair first as usual with your normal  shampoo.  3.  After you shampoo, rinse your hair and body thoroughly to remove the shampoo.                             4.  Use CHG as you would any other liquid soap.  You can apply chg directly to the skin and wash.  Gently with a scrungie or clean washcloth.  5.  Apply the CHG Soap to your body ONLY FROM THE NECK DOWN.   Do   not use on face/ open                           Wound or open sores. Avoid contact with eyes, ears mouth and   genitals (private parts).                       Wash face,  Genitals (private  parts) with your normal soap.             6.  Wash thoroughly, paying special attention to the area where your    surgery  will be performed.  7.  Thoroughly rinse your body with warm water from the neck down.  8.  DO NOT shower/wash with your normal soap after using and rinsing off the CHG Soap.                9.  Pat yourself dry with a clean towel.            10.  Wear clean pajamas.            11.  Place clean sheets on your bed the night of your first shower and do not  sleep with pets. Day of Surgery : Do not apply any lotions/deodorants the morning of surgery.  Please wear clean clothes to the hospital/surgery center.  FAILURE TO FOLLOW THESE INSTRUCTIONS MAY RESULT IN THE CANCELLATION OF YOUR SURGERY  PATIENT SIGNATURE_________________________________  NURSE SIGNATURE__________________________________  ________________________________________________________________________ PCP:

## 2020-06-19 NOTE — Progress Notes (Signed)
Please enter orders for PAT visit scheduled for 06-22-20.

## 2020-06-19 NOTE — H&P (View-Only) (Signed)
Please enter orders for PAT visit scheduled for 06-22-20.

## 2020-06-22 ENCOUNTER — Encounter (HOSPITAL_COMMUNITY)
Admission: RE | Admit: 2020-06-22 | Discharge: 2020-06-22 | Disposition: A | Discharge status: Home / Self Care | Payer: Medicare HMO | Source: Ambulatory Visit | Attending: Podiatry | Admitting: Podiatry

## 2020-06-22 ENCOUNTER — Other Ambulatory Visit: Payer: Self-pay

## 2020-06-22 ENCOUNTER — Encounter (HOSPITAL_COMMUNITY): Payer: Self-pay

## 2020-06-22 DIAGNOSIS — Z01812 Encounter for preprocedural laboratory examination: Secondary | ICD-10-CM | POA: Insufficient documentation

## 2020-06-22 LAB — GLUCOSE, CAPILLARY: Glucose-Capillary: 79 mg/dL (ref 70–99)

## 2020-06-22 NOTE — Progress Notes (Addendum)
  PCP - Dr Sandi Mariscal Cardiologist - pt states does not see  Chest x-ray - 10/24/2019 epic EKG - 10/24/2019 epic  History of Sleep Apnea but pt states does not use CPAP  Patient states checks cbgs sporatically and that blood sugars range normal mostly in 70's A1C in 2021 and 2022 was in low 6's  Patient has been on Eliquis in past for PE but is not taking currently. Apparently per pt this was prescribed for him by an MD in [place]  Activity level: Can go up a flight of stairs and activities of daily living without stopping and without symptoms    Anesthesia review: discussed patient history with Janett Billow PA  Patient denies shortness of breath, fever, cough and chest pain at PAT appointment   Patient verbalized understanding of instructions that were given to them at the PAT appointment. Patient was also instructed that they will need to review over the PAT instructions again at home before surgery.

## 2020-06-24 ENCOUNTER — Encounter: Payer: Self-pay | Admitting: Podiatry

## 2020-06-24 NOTE — Progress Notes (Signed)
Subjective:  Patient ID: Duane Ortiz, male    DOB: 07-Aug-1950,  MRN: [id number]  Chief Complaint  Patient presents with   Wound Check    Left hallux  PT stated that he has been going to see a foot DR in [place]     70 y.o. male presents with the above complaint.  Patient presents with complaint of left hallux bone infection.  Patient was referred by Dr. Mallie Ortiz as a foot and ankle specialist at [facility].  He states been going on for a long time.  He is having some throbbing sensation.  He had an x-ray done by Dr. Mallie Ortiz which showed bone infection.  He was referred here for surgical consult.  He has failed local wound care.  He denies any other acute complaints.  He states that there is no drainage present.  Is dry stable wound with underlying osteomyelitis.  He is a diabetic with unknown A1c.  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   Review of Systems: Negative except as noted in the HPI. Denies N/V/F/Ch.  Past Medical History:  Diagnosis Date   Arthritis    Chronic kidney disease    Chronic kidney disease    Diabetes ([place]) 02/012017   Fainting    Gait abnormality 10/11/2016   GERD (gastroesophageal reflux disease)    Gout    Headache    Hypercholesteremia    Hypercholesterolemia    Hypertension    Peripheral neuropathy 04/02/2019   Pulmonary embolism (HCC)    Shortness of breath    Sleep apnea    does wear cpap   Wears dentures     Current Outpatient Medications:    allopurinol (ZYLOPRIM) 100 MG tablet, Take 100 mg by mouth at bedtime., Disp: , Rfl:    amLODipine (NORVASC) 10 MG tablet, Take 10 mg by mouth at bedtime., Disp: , Rfl:    amLODipine (NORVASC) 5 MG tablet, Take 1 tablet (5 mg total) by mouth daily. (Patient not taking: No sig reported), Disp: 30 tablet, Rfl: 1   amoxicillin-clavulanate (AUGMENTIN) 875-125 MG tablet, Take 1 tablet by mouth every 12 (twelve) hours., Disp: , Rfl:    apixaban (ELIQUIS) 5 MG TABS tablet, Take 1 tablet (5 mg  total) by mouth 2 (two) times daily. (Patient not taking: No sig reported), Disp: 60 tablet, Rfl: 1   ferric citrate (AURYXIA) 1 GM 210 MG(Fe) tablet, Take 420 mg by mouth 3 (three) times daily with meals., Disp: , Rfl:    furosemide (LASIX) 20 MG tablet, Take 20 mg by mouth at bedtime., Disp: , Rfl:    hydrALAZINE (APRESOLINE) 25 MG tablet, Take 1 tablet (25 mg total) by mouth 3 (three) times daily. (Patient not taking: No sig reported), Disp: 90 tablet, Rfl: 1   nebivolol (BYSTOLIC) 10 MG tablet, Take 10 mg by mouth at bedtime. , Disp: , Rfl:    tamsulosin (FLOMAX) 0.4 MG CAPS capsule, Take 0.4 mg by mouth at bedtime. , Disp: , Rfl:   Social History   Tobacco Use  Smoking Status Some Days   Packs/day: 0.25   Pack years: 0.00   Types: Cigarettes  Smokeless Tobacco Never    Allergies  Allergen Reactions   Darvon [Propoxyphene] Itching   Baclofen Other (See Comments)    confusion   Gabapentin     Felt like I was out of my head, slept all day, confusion    Sulfa Antibiotics Itching   Objective:  There were  no vitals filed for this visit. There is no height or weight on file to calculate BMI. Constitutional Well developed. Well nourished.  Vascular Dorsalis pedis pulses palpable bilaterally. Posterior tibial pulses palpable bilaterally. Capillary refill normal to all digits.  No cyanosis or clubbing noted. Pedal hair growth normal.  Neurologic Normal speech. Oriented to person, place, and time. Epicritic sensation to light touch grossly present bilaterally.  Dermatologic Nails well groomed and normal in appearance. No open wounds. No skin lesions.  Orthopedic: Crepitus could be palpated to the left hallux distal IPJ joint.  Mild pain on palpation.  No purulent drainage noted.  Left hallux wound noted appears to be dry stable with probing down to bone.  No malodor present.  No cellulitis noted.   Radiographs: 3 views of skeletally mature adult left foot: Osteomyelitic changes  with cortical destruction noted to the left distal phalanx.  Soft tissue defect correlating with ulceration noted.  No soft tissue gas noted.  No other bony abnormalities identified Assessment:   1. Osteomyelitis of great toe of left foot ([name])   2. Type 2 diabetes mellitus with chronic kidney disease on chronic dialysis, without long-term current use of insulin ([name])   3. Preoperative examination    Plan:  Patient was evaluated and treated and all questions answered.  Left hallux chronic osteomyelitis with wound probing down to bone with history of diabetes -I explained to the patient the etiology of osteomyelitis and various treatment options were extensively discussed.  Given that that he has failed local wound care and now concerning for osteomyelitis without any other clinical signs of infection I believe patient will benefit from a surgical amputation to help control the bone infection.  I discussed this with the patient in extensive detail he states understanding.  He is already currently on antibiotics and I will asked him to continue utilizing antibiotics until surgical amputation.  He states understanding.  He is also on Eliquis.  I will asked that he can continue Eliquis and to surgery.  He states understanding.  I discussed my surgical plan in extensive detail which involves based on the soft tissue defect likely disarticulated leg at the level of the metatarsophalangeal joint.  Patient does have osteomyelitis that involves the head of the proximal phalanx as well.  I discussed my surgical plan in extensive detail.  He will be weightbearing as tolerated after surgery with a surgical shoe.  For now we will continue Betadine wet-to-dry dressing.  He states understanding -Informed surgical risk consent was reviewed and read aloud to the patient.  I reviewed the films.  I have discussed my findings with the patient in great detail.  I have discussed all risks including but not limited to  infection, stiffness, scarring, limp, disability, deformity, damage to blood vessels and nerves, numbness, poor healing, need for braces, arthritis, chronic pain, amputation, death.  All benefits and realistic expectations discussed in great detail.  I have made no promises as to the outcome.  I have provided realistic expectations.  I have offered the patient a 2nd opinion, which they have declined and assured me they preferred to proceed despite the risks   No follow-ups on file.

## 2020-07-01 ENCOUNTER — Encounter (HOSPITAL_COMMUNITY)
Admission: RE | Disposition: A | Discharge status: Home / Self Care | Payer: Self-pay | Source: Other Acute Inpatient Hospital | Attending: Podiatry

## 2020-07-01 ENCOUNTER — Ambulatory Visit (HOSPITAL_COMMUNITY)
Admission: RE | Admit: 2020-07-01 | Discharge: 2020-07-01 | Disposition: A | Discharge status: Home / Self Care | Payer: Medicare HMO | Source: Other Acute Inpatient Hospital | Attending: Podiatry | Admitting: Podiatry

## 2020-07-01 ENCOUNTER — Ambulatory Visit (HOSPITAL_COMMUNITY): Payer: Medicare HMO

## 2020-07-01 ENCOUNTER — Other Ambulatory Visit: Payer: Self-pay | Admitting: Podiatry

## 2020-07-01 ENCOUNTER — Encounter (HOSPITAL_COMMUNITY): Payer: Self-pay | Admitting: Podiatry

## 2020-07-01 ENCOUNTER — Ambulatory Visit (HOSPITAL_COMMUNITY): Payer: Medicare HMO | Admitting: Physician Assistant

## 2020-07-01 DIAGNOSIS — Z89431 Acquired absence of right foot: Secondary | ICD-10-CM

## 2020-07-01 DIAGNOSIS — E1122 Type 2 diabetes mellitus with diabetic chronic kidney disease: Secondary | ICD-10-CM | POA: Diagnosis not present

## 2020-07-01 DIAGNOSIS — E1151 Type 2 diabetes mellitus with diabetic peripheral angiopathy without gangrene: Secondary | ICD-10-CM | POA: Insufficient documentation

## 2020-07-01 DIAGNOSIS — N184 Chronic kidney disease, stage 4 (severe): Secondary | ICD-10-CM | POA: Diagnosis not present

## 2020-07-01 DIAGNOSIS — M869 Osteomyelitis, unspecified: Secondary | ICD-10-CM | POA: Insufficient documentation

## 2020-07-01 DIAGNOSIS — E1169 Type 2 diabetes mellitus with other specified complication: Secondary | ICD-10-CM | POA: Insufficient documentation

## 2020-07-01 DIAGNOSIS — M89672 Osteopathy after poliomyelitis, left ankle and foot: Secondary | ICD-10-CM | POA: Diagnosis not present

## 2020-07-01 HISTORY — PX: AMPUTATION TOE: SHX6595

## 2020-07-01 LAB — CBC
HCT: 37.2 % — ABNORMAL LOW (ref 39.0–52.0)
Hemoglobin: 11.9 g/dL — ABNORMAL LOW (ref 13.0–17.0)
MCH: 29.8 pg (ref 26.0–34.0)
MCHC: 32 g/dL (ref 30.0–36.0)
MCV: 93 fL (ref 80.0–100.0)
Platelets: 245 10*3/uL (ref 150–400)
RBC: 4 MIL/uL — ABNORMAL LOW (ref 4.22–5.81)
RDW: 15.4 % (ref 11.5–15.5)
WBC: 7.8 10*3/uL (ref 4.0–10.5)
nRBC: 0 % (ref 0.0–0.2)

## 2020-07-01 LAB — BASIC METABOLIC PANEL
Anion gap: 13 (ref 5–15)
BUN: 47 mg/dL — ABNORMAL HIGH (ref 8–23)
CO2: 26 mmol/L (ref 22–32)
Calcium: 9 mg/dL (ref 8.9–10.3)
Chloride: 100 mmol/L (ref 98–111)
Creatinine, Ser: 8.05 mg/dL — ABNORMAL HIGH (ref 0.61–1.24)
GFR, Estimated: 7 mL/min — ABNORMAL LOW (ref >60)
Glucose, Bld: 92 mg/dL (ref 70–99)
Potassium: 4 mmol/L (ref 3.5–5.1)
Sodium: 139 mmol/L (ref 135–145)

## 2020-07-01 LAB — GLUCOSE, CAPILLARY
Glucose-Capillary: 92 mg/dL (ref 70–99)
Glucose-Capillary: 94 mg/dL (ref 70–99)

## 2020-07-01 IMAGING — DX DG FOOT COMPLETE 3+V*L*
3 series · 3 of 3 positions shown · non-contrast
Comparison: [DATE].

CLINICAL DATA: Prior amputation.

EXAM:
LEFT FOOT - COMPLETE 3+ VIEW

[foot ap]
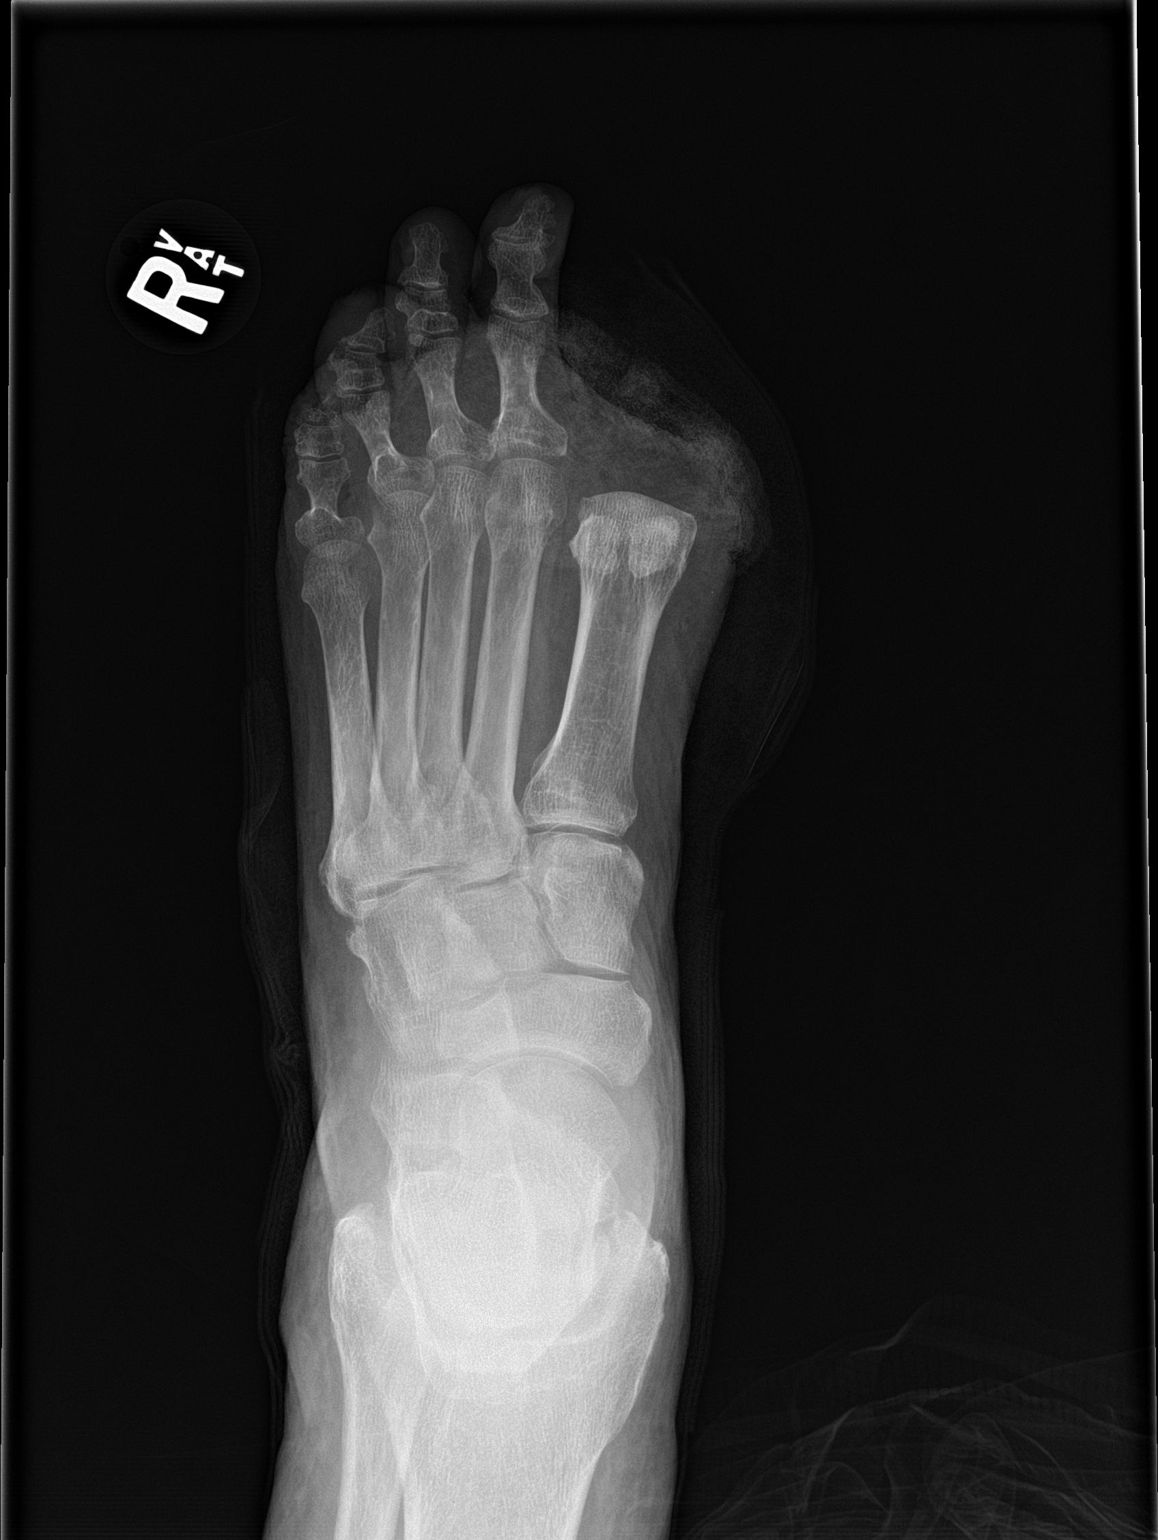

[foot obl]
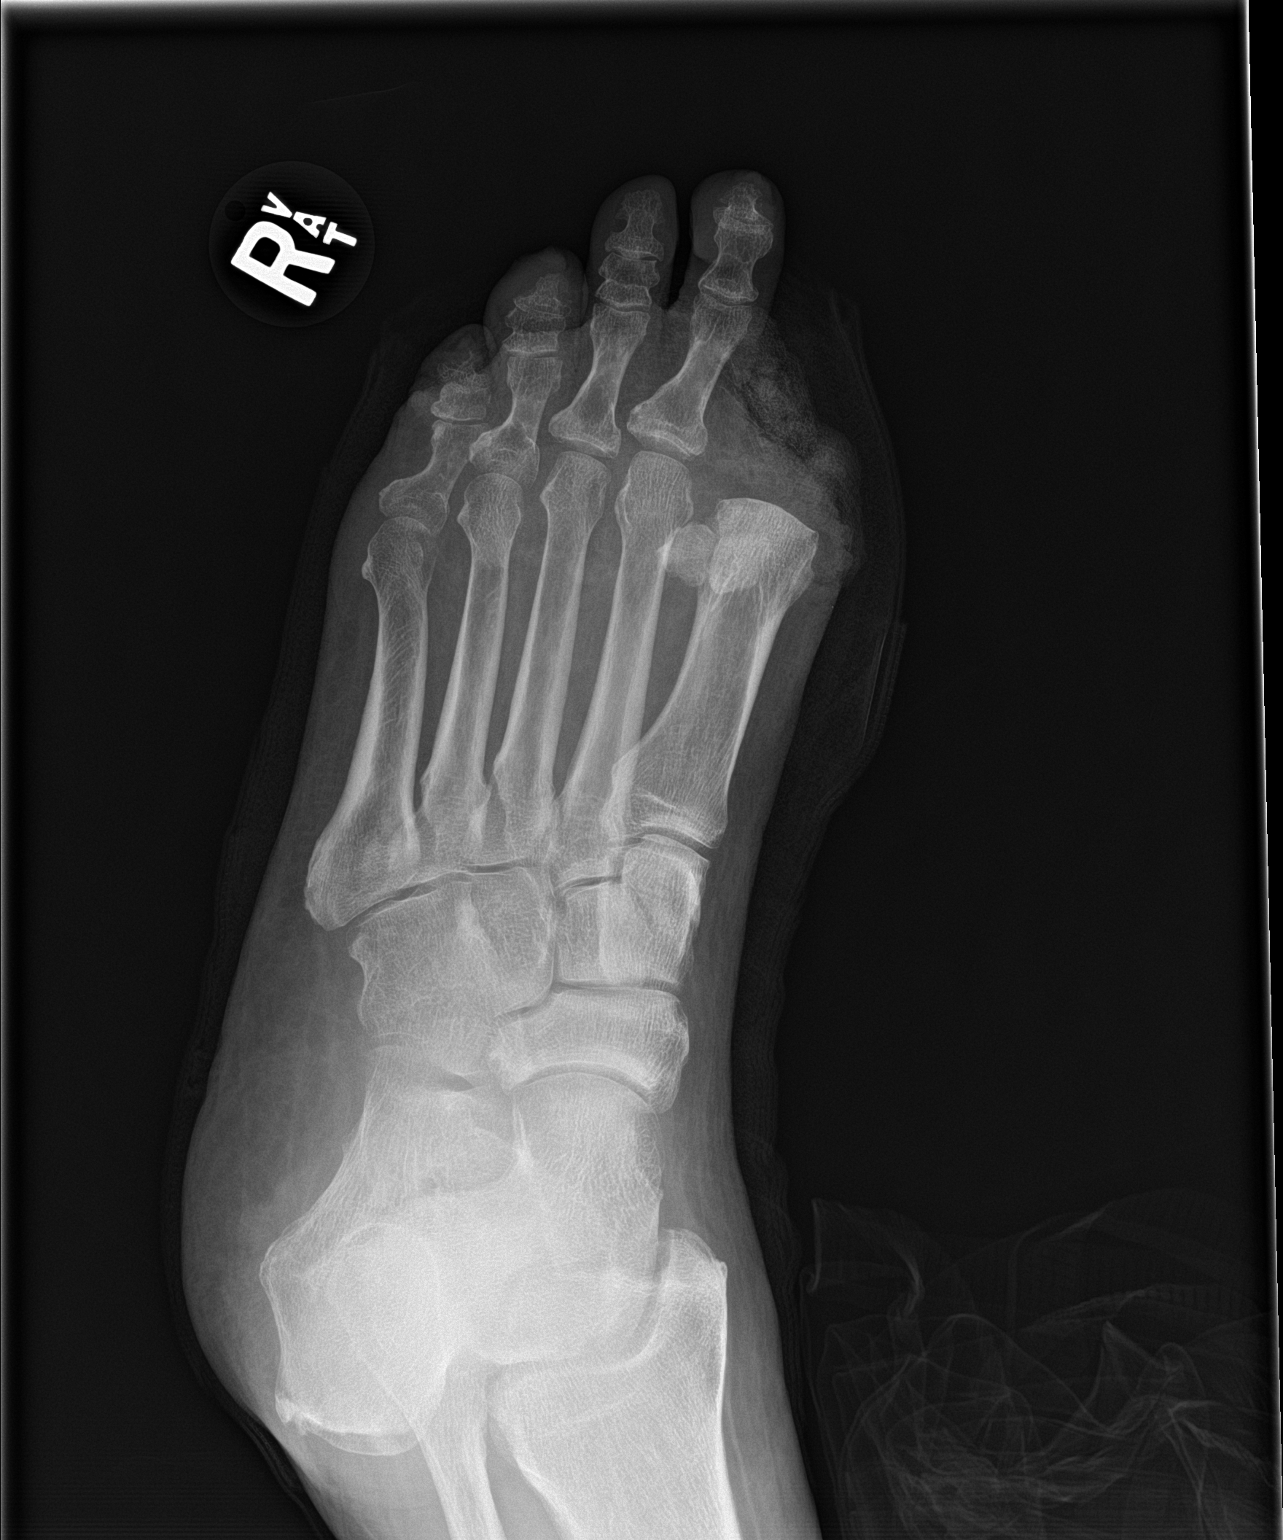

[foot lat]
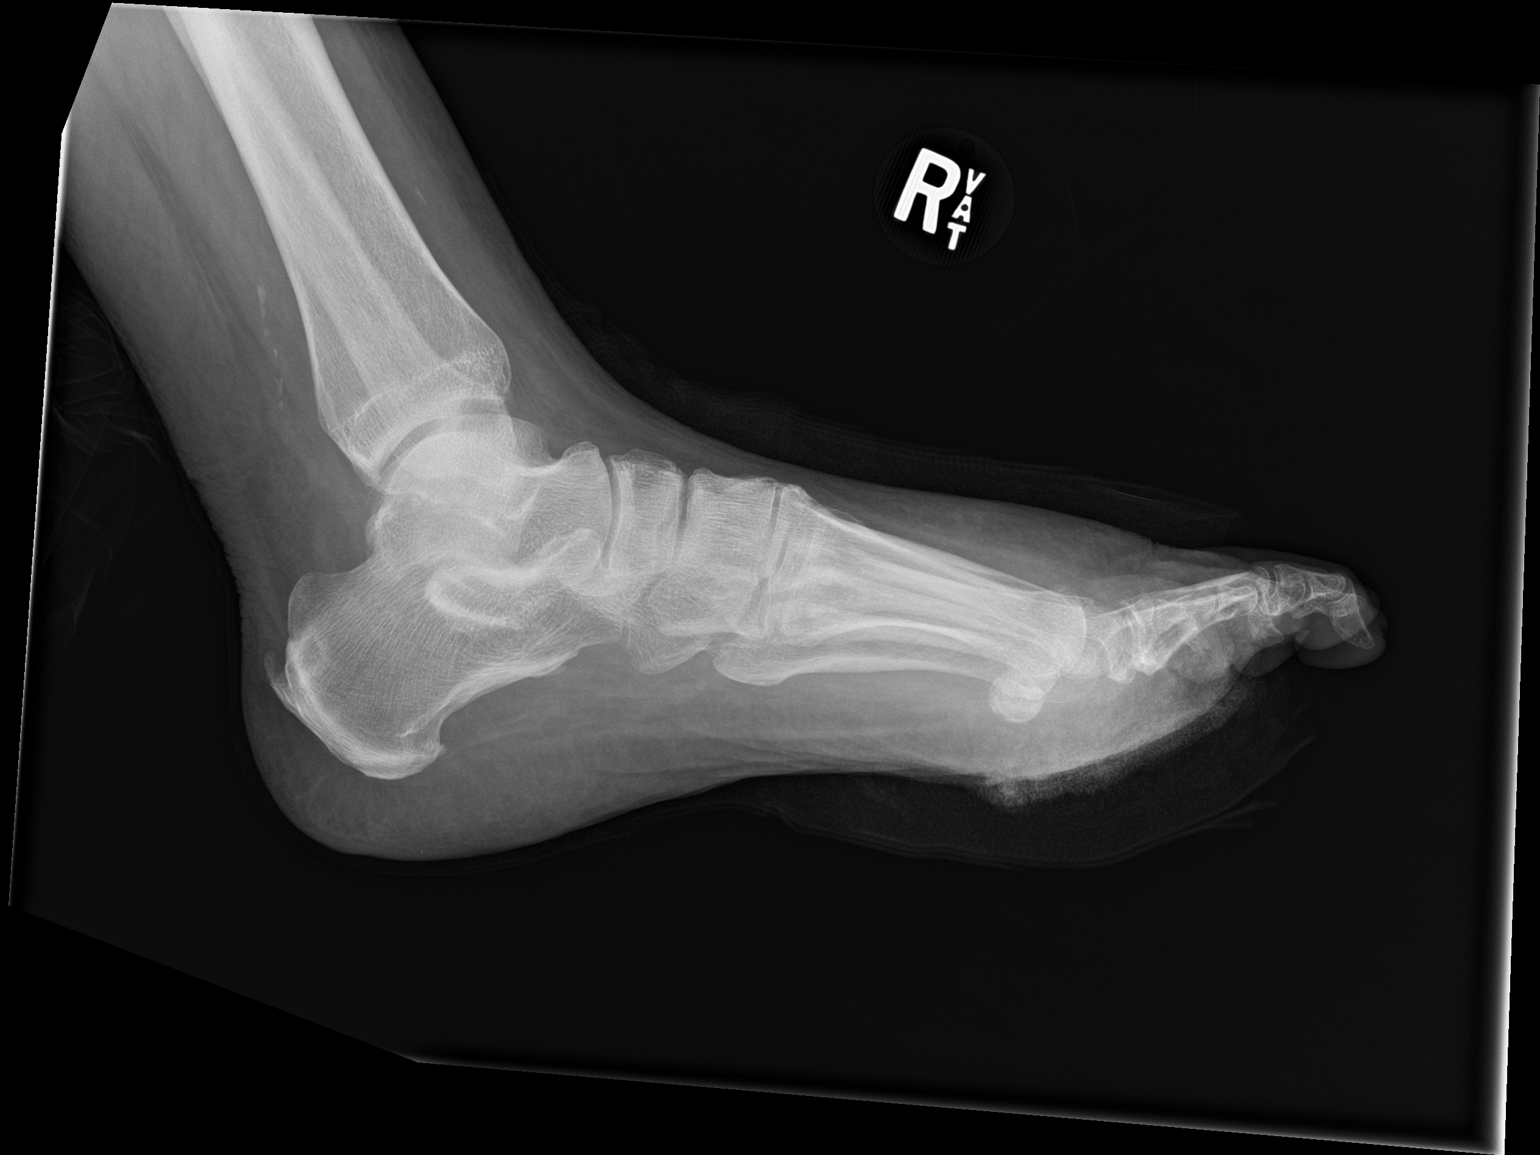

[3 of 3 positions shown; findings below may reference images not displayed]

FINDINGS: Bandage noted over the left foot. Patient status post left great toe
amputation. Soft tissue swelling about the left first metatarsal
cannot be excluded. No bony erosions. No evidence of fracture or
dislocation. Diffuse degenerative change. No radiopaque foreign
body. Peripheral vascular calcification.
IMPRESSION: 1. Bandage noted over the left foot. Patient status post left great
toe amputation. Soft tissue swelling about the left first metatarsal
cannot be excluded. No bony erosions.

2.  Peripheral vascular disease.

## 2020-07-01 SURGERY — AMPUTATION, TOE
Anesthesia: Monitor Anesthesia Care | Site: Toe | Laterality: Left

## 2020-07-01 MED ORDER — SODIUM CHLORIDE 0.9 % IV SOLN: INTRAVENOUS | Status: DC

## 2020-07-01 MED ORDER — CEFAZOLIN SODIUM-DEXTROSE 2-4 GM/100ML-% IV SOLN
INTRAVENOUS | Status: AC
  Filled 2020-07-01: qty 100

## 2020-07-01 MED ORDER — OXYCODONE-ACETAMINOPHEN 5-325 MG PO TABS: 1.0000 | ORAL_TABLET | ORAL | 0 refills | Status: DC | PRN

## 2020-07-01 MED ORDER — BUPIVACAINE HCL (PF) 0.25 % IJ SOLN
INTRAMUSCULAR | Status: AC
  Filled 2020-07-01: qty 30

## 2020-07-01 MED ORDER — AMISULPRIDE (ANTIEMETIC) 5 MG/2ML IV SOLN: 10.0000 mg | Freq: Once | INTRAVENOUS | Status: DC | PRN

## 2020-07-01 MED ORDER — CEFAZOLIN SODIUM-DEXTROSE 2-4 GM/100ML-% IV SOLN
2.0000 g | Freq: Once | INTRAVENOUS | Status: AC
  Administered 2020-07-01: 2 g via INTRAVENOUS
  Filled 2020-07-01: qty 100

## 2020-07-01 MED ORDER — LIDOCAINE HCL (PF) 1 % IJ SOLN
INTRAMUSCULAR | Status: AC
  Filled 2020-07-01: qty 30

## 2020-07-01 MED ORDER — PROMETHAZINE HCL 25 MG/ML IJ SOLN: 6.2500 mg | INTRAMUSCULAR | Status: DC | PRN

## 2020-07-01 MED ORDER — BUPIVACAINE HCL 0.25 % IJ SOLN
INTRAMUSCULAR | Status: DC | PRN
  Administered 2020-07-01: 4.5 mL

## 2020-07-01 MED ORDER — ORAL CARE MOUTH RINSE: 15.0000 mL | Freq: Once | OROMUCOSAL | Status: AC

## 2020-07-01 MED ORDER — LIDOCAINE 1 % OPTIME INJ - NO CHARGE
INTRAMUSCULAR | Status: DC | PRN
  Administered 2020-07-01: 4.5 mL

## 2020-07-01 MED ORDER — CHLORHEXIDINE GLUCONATE 0.12 % MT SOLN
15.0000 mL | Freq: Once | OROMUCOSAL | Status: AC
Start: 2020-07-01 — End: 2020-07-01
  Administered 2020-07-01: 15 mL via OROMUCOSAL

## 2020-07-01 MED ORDER — 0.9 % SODIUM CHLORIDE (POUR BTL) OPTIME
TOPICAL | Status: DC | PRN
  Administered 2020-07-01: 1000 mL

## 2020-07-01 MED ORDER — PROPOFOL 500 MG/50ML IV EMUL
INTRAVENOUS | Status: DC | PRN
  Administered 2020-07-01: 125 ug/kg/min via INTRAVENOUS

## 2020-07-01 MED ORDER — FENTANYL CITRATE (PF) 100 MCG/2ML IJ SOLN: 25.0000 ug | INTRAMUSCULAR | Status: DC | PRN

## 2020-07-01 MED ORDER — ACETAMINOPHEN 500 MG PO TABS
1000.0000 mg | ORAL_TABLET | Freq: Once | ORAL | Status: AC
  Administered 2020-07-01: 1000 mg via ORAL
  Filled 2020-07-01: qty 2

## 2020-07-01 SURGICAL SUPPLY — 50 items
BAG COUNTER SPONGE SURGICOUNT (BAG) IMPLANT
BLADE SURG 15 STRL LF DISP TIS (BLADE) ×1 IMPLANT
BLADE SURG 15 STRL SS (BLADE) ×1
BLADE SURG SZ10 CARB STEEL (BLADE) ×2 IMPLANT
BNDG ELASTIC 3X5.8 VLCR STR LF (GAUZE/BANDAGES/DRESSINGS) IMPLANT
BNDG ELASTIC 4X5.8 VLCR STR LF (GAUZE/BANDAGES/DRESSINGS) ×2 IMPLANT
BNDG ESMARK 4X9 LF (GAUZE/BANDAGES/DRESSINGS) IMPLANT
BNDG GAUZE ELAST 4 BULKY (GAUZE/BANDAGES/DRESSINGS) ×2 IMPLANT
CHLORAPREP W/TINT 26 (MISCELLANEOUS) ×2 IMPLANT
COVER BACK TABLE 60X90IN (DRAPES) ×2 IMPLANT
CUFF TOURN SGL QUICK 34 (TOURNIQUET CUFF)
CUFF TRNQT CYL 34X4.125X (TOURNIQUET CUFF) IMPLANT
DRAPE EXTREMITY T 121X128X90 (DISPOSABLE) ×2 IMPLANT
DRAPE IMP U-DRAPE 54X76 (DRAPES) ×2 IMPLANT
DRAPE SURG 17X23 STRL (DRAPES) IMPLANT
DRAPE U-SHAPE 47X51 STRL (DRAPES) IMPLANT
DRSG EMULSION OIL 3X3 NADH (GAUZE/BANDAGES/DRESSINGS) IMPLANT
ELECT PENCIL ROCKER SW 15FT (MISCELLANEOUS) ×2 IMPLANT
ELECT REM PT RETURN 15FT ADLT (MISCELLANEOUS) ×2 IMPLANT
GAUZE 4X4 16PLY ~~LOC~~+RFID DBL (SPONGE) IMPLANT
GAUZE SPONGE 4X4 12PLY STRL (GAUZE/BANDAGES/DRESSINGS) ×2 IMPLANT
GAUZE XEROFORM 1X8 LF (GAUZE/BANDAGES/DRESSINGS) ×2 IMPLANT
GLOVE SURG ENC MOIS LTX SZ7 (GLOVE) ×2 IMPLANT
GLOVE SURG UNDER POLY LF SZ7.5 (GLOVE) ×2 IMPLANT
GOWN STRL REUS W/ TWL LRG LVL3 (GOWN DISPOSABLE) ×1 IMPLANT
GOWN STRL REUS W/ TWL XL LVL3 (GOWN DISPOSABLE) ×1 IMPLANT
GOWN STRL REUS W/TWL LRG LVL3 (GOWN DISPOSABLE) ×1
GOWN STRL REUS W/TWL XL LVL3 (GOWN DISPOSABLE) ×1
KIT BASIN OR (CUSTOM PROCEDURE TRAY) ×2 IMPLANT
NDL SAFETY ECLIPSE 18X1.5 (NEEDLE) IMPLANT
NEEDLE HYPO 18GX1.5 SHARP (NEEDLE)
NEEDLE HYPO 25X1 1.5 SAFETY (NEEDLE) ×2 IMPLANT
NS IRRIG 1000ML POUR BTL (IV SOLUTION) IMPLANT
PADDING CAST ABS 4INX4YD NS (CAST SUPPLIES)
PADDING CAST ABS COTTON 4X4 ST (CAST SUPPLIES) IMPLANT
PENCIL SMOKE EVACUATOR (MISCELLANEOUS) IMPLANT
SPONGE T-LAP 18X18 ~~LOC~~+RFID (SPONGE) ×2 IMPLANT
STAPLER VISISTAT 35W (STAPLE) IMPLANT
STOCKINETTE 6  STRL (DRAPES) ×1
STOCKINETTE 6 STRL (DRAPES) ×1 IMPLANT
SUT MNCRL AB 3-0 PS2 18 (SUTURE) IMPLANT
SUT MNCRL AB 4-0 PS2 18 (SUTURE) IMPLANT
SUT MON AB 5-0 PS2 18 (SUTURE) IMPLANT
SUT PROLENE 3 0 SH1 36 (SUTURE) ×4 IMPLANT
SUT PROLENE 4 0 PS 2 18 (SUTURE) IMPLANT
SUT VIC AB 4-0 P2 18 (SUTURE) IMPLANT
SYR BULB EAR ULCER 3OZ GRN STR (SYRINGE) ×2 IMPLANT
SYR CONTROL 10ML LL (SYRINGE) ×2 IMPLANT
TOWEL OR 17X26 10 PK STRL BLUE (TOWEL DISPOSABLE) ×2 IMPLANT
UNDERPAD 30X36 HEAVY ABSORB (UNDERPADS AND DIAPERS) ×2 IMPLANT

## 2020-07-01 NOTE — Anesthesia Postprocedure Evaluation (Signed)
Anesthesia Post Note  Patient: Duane Ortiz  Procedure(s) Performed: AMPUTATION OF LEFT HALLUS (Left: Toe)     Patient location during evaluation: PACU Anesthesia Type: MAC Level of consciousness: awake and alert Pain management: pain level controlled Vital Signs Assessment: post-procedure vital signs reviewed and stable Respiratory status: spontaneous breathing and respiratory function stable Cardiovascular status: stable Postop Assessment: no apparent nausea or vomiting Anesthetic complications: no   No notable events documented.  Last Vitals:  Vitals:   07/01/20 1315 07/01/20 1330  BP: 139/67 130/63  Pulse: (!) 57   Resp: 11   Temp: 36.7 C 36.8 C  SpO2: 97% 97%    Last Pain:  Vitals:   07/01/20 1330  TempSrc:   PainSc: 0-No pain                 [name] DANIEL

## 2020-07-01 NOTE — Anesthesia Preprocedure Evaluation (Addendum)
Anesthesia Evaluation  Patient identified by MRN, date of birth, ID band Patient awake    Reviewed: Allergy & Precautions, NPO status , Patient's Chart, lab work & pertinent test results  History of Anesthesia Complications Negative for: history of anesthetic complications  Airway Mallampati: I  TM Distance: >3 FB Neck ROM: Full    Dental  (+) Edentulous Upper, Edentulous Lower, Dental Advisory Given   Pulmonary shortness of breath, sleep apnea and Continuous Positive Airway Pressure Ventilation , Current Smoker and Patient abstained from smoking.,    Pulmonary exam normal        Cardiovascular hypertension, Pt. on medications and Pt. on home beta blockers Normal cardiovascular exam     Neuro/Psych  Headaches,  Neuromuscular disease    GI/Hepatic Neg liver ROS, GERD  ,  Endo/Other  diabetes  Renal/GU Renal disease     Musculoskeletal  (+) Arthritis ,   Abdominal   Peds  Hematology negative hematology ROS (+)   Anesthesia Other Findings   Reproductive/Obstetrics                           Anesthesia Physical  Anesthesia Plan  ASA: 4  Anesthesia Plan: MAC   Post-op Pain Management:  Regional for Post-op pain   Induction: Intravenous  PONV Risk Score and Plan: 0 and Propofol infusion, TIVA and Treatment may vary due to age or medical condition  Airway Management Planned: Natural Airway  Additional Equipment: None  Intra-op Plan:   Post-operative Plan:   Informed Consent:     Dental advisory given  Plan Discussed with: CRNA and Anesthesiologist  Anesthesia Plan Comments:        Anesthesia Quick Evaluation

## 2020-07-01 NOTE — Discharge Instructions (Signed)
After Surgery Instructions   1) If you are recuperating from surgery anywhere other than home, please be sure to leave us the number where you can be reached.  2) Go directly home and rest.  3) Keep the operated foot(feet) elevated six inches above the hip when sitting or lying down. This will help control swelling and pain.  4) Support the elevated foot and leg with pillows. DO NOT PLACE PILLOWS UNDER THE KNEE.  5) DO NOT REMOVE or get your bandages WET, unless you were given different instructions by your doctor to do so. This increases the risk of infection.  6) Wear your surgical shoe or surgical boot at all times when you are up on your feet.  7) A limited amount of pain and swelling may occur. The skin may take on a bruised appearance. DO NOT BE ALARMED, THIS IS NORMAL.  8) For slight pain and swelling, apply an ice pack directly over the bandages for 15 minutes only out of each hour of the day. Continue until seen in the office for your first post op visit. DON NOT     APPLY ANY FORM OF HEAT TO THE AREA.  9) Have prescriptions filled immediately and take as directed.  10) Drink lots of liquids, water and juice to stay hydrated.  11) CALL IMMEDIATELY IF:  *Bleeding continues until the following day of surgery  *Pain increases and/or does not respond to medication  *Bandages or cast appears to tight  *If your bandage gets wet  *Trip, fall or stump your surgical foot  *If your temperature goes above 101  *If you have ANY questions at all  YOU NOW CONTROL THE EFFORT OF YOUR RECOVERY. ADHERING TO THESE INSTRUCTIONS WILL OFFER YOU THE MOST COMPLETE RESULTS  

## 2020-07-01 NOTE — Op Note (Signed)
Surgeon: Surgeon(s): Felipa Furnace, DPM  Assistants: None Pre-operative diagnosis: OSTEOMELLITIS  Post-operative diagnosis: same Procedure: Procedure(s) (LRB): AMPUTATION OF LEFT HALLUS (Left)  Pathology:  ID Type Source Tests Collected by Time Destination  1 : Left toe Tissue PATH Digit amputation SURGICAL PATHOLOGY Felipa Furnace, DPM 07/01/2020 1224     Pertinent Intra-op findings: Osteomyelitis of the left hallux noted.  No purulent drainage noted. Anesthesia: Monitor Anesthesia Care  Hemostasis: * No tourniquets in log * EBL: Minimal Materials: 3-0 Prolene Injectables: None Complications: None  Indications for surgery: A 70 y.o. male presents with left hallux osteomyelitis. Patient has failed all conservative therapy including but not limited to local wound care and antibiotics. He wishes to have surgical correction of the foot/deformity. It was determined that patient would benefit from left hallux amputation at the level of the metatarsophalangeal joint. Informed surgical risk consent was reviewed and read aloud to the patient.  I reviewed the films.  I have discussed my findings with the patient in great detail.  I have discussed all risks including but not limited to infection, stiffness, scarring, limp, disability, deformity, damage to blood vessels and nerves, numbness, poor healing, need for braces, arthritis, chronic pain, amputation, death.  All benefits and realistic expectations discussed in great detail.  I have made no promises as to the outcome.  I have provided realistic expectations.  I have offered the patient a 2nd opinion, which they have declined and assured me they preferred to proceed despite the risks   Procedure in detail: The patient was both verbally and visually identified by myself, the nursing staff, and anesthesia staff in the preoperative holding area. They were then transferred to the operating room and placed on the operative table in supine  position.  Attention was directed to the left foot, using skin marker 6 fishmouth style incision was delineated in closing the entire hallux at the level of the metatarsophalangeal joint.  Using #15 blade the incision was carried down through the epidermis dermis layer down to the level of the bone.  The toe itself was disarticulated at level of the metatarsophalangeal joint.  The proximal margin appeared clear of infection.  The toe was sent to pathology in standard technique.  The wound was thoroughly irrigated with normal saline solution.  At this time no further infection was noted.  The wound can be primarily closed.  Using 3-0 Prolene the wound was primarily closed with simple interrupted suture technique.  The wound was dressed with Betadine 4 x 4, Xeroform, Kerlix, Ace bandage.  All bony prominences were adequately padded.  At the conclusion of the procedure the patient was awoken from anesthesia and found to have tolerated the procedure well any complications. There were transferred to PACU with vital signs stable and vascular status intact.  Boneta Lucks, DPM

## 2020-07-01 NOTE — Interval H&P Note (Signed)
History and Physical Interval Note:  07/01/2020 11:54 AM  Duane Ortiz  has presented today for surgery, with the diagnosis of OSTEOMELLITIS.  The various methods of treatment have been discussed with the patient and family. After consideration of risks, benefits and other options for treatment, the patient has consented to  Procedure(s): AMPUTATION OF LEFT HALLUS (Left) as a surgical intervention.  The patient's history has been reviewed, patient examined, no change in status, stable for surgery.  I have reviewed the patient's chart and labs.  Questions were answered to the patient's satisfaction.     Felipa Furnace

## 2020-07-01 NOTE — Transfer of Care (Signed)
Immediate Anesthesia Transfer of Care Note  Patient: Belinda Bringhurst  Procedure(s) Performed: AMPUTATION OF LEFT HALLUS (Left: Toe)  Patient Location: PACU  Anesthesia Type:MAC  Level of Consciousness: awake, alert  and oriented  Airway & Oxygen Therapy: Patient Spontanous Breathing and Patient connected to face mask  Post-op Assessment: Report given to RN and Post -op Vital signs reviewed and stable  Post vital signs: Reviewed and stable  Last Vitals:  Vitals Value Taken Time  BP 117/62 07/01/20 1234  Temp    Pulse 63 07/01/20 1236  Resp 13 07/01/20 1236  SpO2 100 % 07/01/20 1236  Vitals shown include unvalidated device data.  Last Pain:  Vitals:   07/01/20 1031  TempSrc:   PainSc: 6       Patients Stated Pain Goal: 4 (81/85/90 9311)  Complications: No notable events documented.

## 2020-07-02 ENCOUNTER — Encounter (HOSPITAL_COMMUNITY): Payer: Self-pay | Admitting: Podiatry

## 2020-07-03 LAB — SURGICAL PATHOLOGY

## 2020-07-10 ENCOUNTER — Ambulatory Visit (INDEPENDENT_AMBULATORY_CARE_PROVIDER_SITE_OTHER): Payer: Medicare HMO

## 2020-07-10 ENCOUNTER — Other Ambulatory Visit: Payer: Self-pay

## 2020-07-10 ENCOUNTER — Ambulatory Visit (INDEPENDENT_AMBULATORY_CARE_PROVIDER_SITE_OTHER): Payer: Medicare HMO | Admitting: Podiatry

## 2020-07-10 DIAGNOSIS — Z89412 Acquired absence of left great toe: Secondary | ICD-10-CM

## 2020-07-10 DIAGNOSIS — M86172 Other acute osteomyelitis, left ankle and foot: Secondary | ICD-10-CM | POA: Diagnosis not present

## 2020-07-10 DIAGNOSIS — M869 Osteomyelitis, unspecified: Secondary | ICD-10-CM | POA: Diagnosis not present

## 2020-07-14 ENCOUNTER — Encounter: Payer: Self-pay | Admitting: Podiatry

## 2020-07-14 NOTE — Progress Notes (Signed)
Subjective:  Patient ID: Duane Ortiz, male    DOB: [birth year],  MRN: [id number]  No chief complaint on file.   DOS: 07/01/2020 Procedure: Left great toe amputation  70 y.o. male returns for post-op check.  Patient states is doing well.  He is ready for the stitches to come out.  He denies any other acute complaints he has been ambulating with surgical shoe.  He would like to know when he can return to regular shoes.  Review of Systems: Negative except as noted in the HPI. Denies N/V/F/Ch.  Past Medical History:  Diagnosis Date   Arthritis    Chronic kidney disease    Chronic kidney disease    Diabetes ([place]) 02/012017   Fainting    Gait abnormality 10/11/2016   GERD (gastroesophageal reflux disease)    Gout    Headache    Hypercholesteremia    Hypercholesterolemia    Hypertension    Peripheral neuropathy 04/02/2019   Pulmonary embolism ([facility])    Shortness of breath    Sleep apnea    does wear cpap   Wears dentures     Current Outpatient Medications:    allopurinol (ZYLOPRIM) 100 MG tablet, Take 100 mg by mouth at bedtime., Disp: , Rfl:    amLODipine (NORVASC) 10 MG tablet, Take 10 mg by mouth at bedtime., Disp: , Rfl:    amLODipine (NORVASC) 5 MG tablet, Take 1 tablet (5 mg total) by mouth daily., Disp: 30 tablet, Rfl: 1   amoxicillin-clavulanate (AUGMENTIN) 875-125 MG tablet, Take 1 tablet by mouth every 12 (twelve) hours., Disp: , Rfl:    apixaban (ELIQUIS) 5 MG TABS tablet, Take 1 tablet (5 mg total) by mouth 2 (two) times daily. (Patient not taking: No sig reported), Disp: 60 tablet, Rfl: 1   ferric citrate (AURYXIA) 1 GM 210 MG(Fe) tablet, Take 420 mg by mouth 3 (three) times daily with meals., Disp: , Rfl:    furosemide (LASIX) 20 MG tablet, Take 20 mg by mouth at bedtime., Disp: , Rfl:    hydrALAZINE (APRESOLINE) 25 MG tablet, Take 1 tablet (25 mg total) by mouth 3 (three) times daily. (Patient not taking: No sig reported), Disp: 90 tablet, Rfl: 1   nebivolol  (BYSTOLIC) 10 MG tablet, Take 10 mg by mouth at bedtime. , Disp: , Rfl:    oxyCODONE-acetaminophen (PERCOCET) 5-325 MG tablet, Take 1-2 tablets by mouth every 4 (four) hours as needed for severe pain., Disp: 30 tablet, Rfl: 0   tamsulosin (FLOMAX) 0.4 MG CAPS capsule, Take 0.4 mg by mouth at bedtime. , Disp: , Rfl:   Social History   Tobacco Use  Smoking Status Some Days   Packs/day: 0.25   Pack years: 0.00   Types: Cigarettes  Smokeless Tobacco Never    Allergies  Allergen Reactions   Darvon [Propoxyphene] Itching   Baclofen Other (See Comments)    confusion   Gabapentin     Felt like I was out of my head, slept all day, confusion    Sulfa Antibiotics Itching   Objective:  There were no vitals filed for this visit. There is no height or weight on file to calculate BMI. Constitutional Well developed. Well nourished.  Vascular Foot warm and well perfused. Capillary refill normal to all digits.   Neurologic Normal speech. Oriented to person, place, and time. Epicritic sensation to light touch grossly present bilaterally.  Dermatologic Skin healing well without signs of infection. Skin edges well coapted without signs of infection.  Orthopedic: Tenderness  to palpation noted about the surgical site.   Radiographs: 3 views of skeletally mature adult left foot: Interval amputation noted of the left hallux.  No osteomyelitic changes noted.  No soft tissue emphysema noted. Assessment:   1. Osteomyelitis of great toe of left foot ([place])   2. Status post amputation of great toe, left ([place])    Plan:  Patient was evaluated and treated and all questions answered.  S/p foot surgery left -Progressing as expected post-operatively. -XR: See above -WB Status: Weight-bear as tolerated in surgical shoe -Sutures: Removed.  No clinical signs of dehiscence noted.  No complication noted. -Medications: None -Foot redressed.  No follow-ups on file.

## 2020-07-22 ENCOUNTER — Ambulatory Visit (INDEPENDENT_AMBULATORY_CARE_PROVIDER_SITE_OTHER): Payer: Medicare HMO | Admitting: Podiatry

## 2020-07-22 ENCOUNTER — Other Ambulatory Visit: Payer: Self-pay

## 2020-07-22 DIAGNOSIS — Z89412 Acquired absence of left great toe: Secondary | ICD-10-CM

## 2020-07-22 DIAGNOSIS — M869 Osteomyelitis, unspecified: Secondary | ICD-10-CM

## 2020-07-28 ENCOUNTER — Encounter: Payer: Self-pay | Admitting: Podiatry

## 2020-07-28 NOTE — Progress Notes (Signed)
Subjective:  Patient ID: Duane Ortiz, male    DOB: 1950-01-25,  MRN: [id number]  Chief Complaint  Patient presents with   Routine Post Op    POST OP DOS 6.29.22     DOS: 07/01/2020 Procedure: Left great toe amputation  70 y.o. male returns for post-op check.  Patient states is doing well.  He denies any other acute complaints.  Is ambulating in surgical shoe.  He would like to discuss next treatment plans.  Review of Systems: Negative except as noted in the HPI. Denies N/V/F/Ch.  Past Medical History:  Diagnosis Date   Arthritis    Chronic kidney disease    Chronic kidney disease    Diabetes ([place]) 02/012017   Fainting    Gait abnormality 10/11/2016   GERD (gastroesophageal reflux disease)    Gout    Headache    Hypercholesteremia    Hypercholesterolemia    Hypertension    Peripheral neuropathy 04/02/2019   Pulmonary embolism ([facility])    Shortness of breath    Sleep apnea    does wear cpap   Wears dentures     Current Outpatient Medications:    allopurinol (ZYLOPRIM) 100 MG tablet, Take 100 mg by mouth at bedtime., Disp: , Rfl:    amLODipine (NORVASC) 10 MG tablet, Take 10 mg by mouth at bedtime., Disp: , Rfl:    amLODipine (NORVASC) 5 MG tablet, Take 1 tablet (5 mg total) by mouth daily., Disp: 30 tablet, Rfl: 1   amoxicillin-clavulanate (AUGMENTIN) 875-125 MG tablet, Take 1 tablet by mouth every 12 (twelve) hours., Disp: , Rfl:    apixaban (ELIQUIS) 5 MG TABS tablet, Take 1 tablet (5 mg total) by mouth 2 (two) times daily. (Patient not taking: No sig reported), Disp: 60 tablet, Rfl: 1   ferric citrate (AURYXIA) 1 GM 210 MG(Fe) tablet, Take 420 mg by mouth 3 (three) times daily with meals., Disp: , Rfl:    furosemide (LASIX) 20 MG tablet, Take 20 mg by mouth at bedtime., Disp: , Rfl:    hydrALAZINE (APRESOLINE) 25 MG tablet, Take 1 tablet (25 mg total) by mouth 3 (three) times daily. (Patient not taking: No sig reported), Disp: 90 tablet, Rfl: 1   nebivolol (BYSTOLIC)  10 MG tablet, Take 10 mg by mouth at bedtime. , Disp: , Rfl:    oxyCODONE-acetaminophen (PERCOCET) 5-325 MG tablet, Take 1-2 tablets by mouth every 4 (four) hours as needed for severe pain., Disp: 30 tablet, Rfl: 0   tamsulosin (FLOMAX) 0.4 MG CAPS capsule, Take 0.4 mg by mouth at bedtime. , Disp: , Rfl:   Social History   Tobacco Use  Smoking Status Some Days   Packs/day: 0.25   Types: Cigarettes  Smokeless Tobacco Never    Allergies  Allergen Reactions   Darvon [Propoxyphene] Itching   Baclofen Other (See Comments)    confusion   Gabapentin     Felt like I was out of my head, slept all day, confusion    Sulfa Antibiotics Itching   Objective:  There were no vitals filed for this visit. There is no height or weight on file to calculate BMI. Constitutional Well developed. Well nourished.  Vascular Foot warm and well perfused. Capillary refill normal to all digits.   Neurologic Normal speech. Oriented to person, place, and time. Epicritic sensation to light touch grossly present bilaterally.  Dermatologic Skin completely epithelialized.  No clinical signs of infection noted.  No further wound or recurrence of wound noted.  Orthopedic: No tenderness  to palpation noted about the surgical site.   Radiographs: 3 views of skeletally mature adult left foot: Interval amputation noted of the left hallux.  No osteomyelitic changes noted.  No soft tissue emphysema noted. Assessment:   1. Osteomyelitis of great toe of left foot ([place])   2. Status post amputation of great toe, left ([place])     Plan:  Patient was evaluated and treated and all questions answered.  S/p foot surgery left -Progressing as expected post-operatively. -XR: See above -WB Status: Weight-bear as tolerated in surgical shoe -Sutures: Removed.  No clinical signs of dehiscence noted.  No complication noted. -Medications: None -Foot redressed.  No follow-ups on file.

## 2020-08-05 ENCOUNTER — Other Ambulatory Visit: Payer: Self-pay

## 2020-08-05 ENCOUNTER — Encounter: Payer: Self-pay | Admitting: Podiatry

## 2020-08-05 ENCOUNTER — Ambulatory Visit (INDEPENDENT_AMBULATORY_CARE_PROVIDER_SITE_OTHER): Payer: Medicare HMO | Admitting: Podiatry

## 2020-08-05 DIAGNOSIS — Z992 Dependence on renal dialysis: Secondary | ICD-10-CM | POA: Diagnosis not present

## 2020-08-05 DIAGNOSIS — Z89412 Acquired absence of left great toe: Secondary | ICD-10-CM

## 2020-08-05 DIAGNOSIS — E1122 Type 2 diabetes mellitus with diabetic chronic kidney disease: Secondary | ICD-10-CM | POA: Diagnosis not present

## 2020-08-05 DIAGNOSIS — N186 End stage renal disease: Secondary | ICD-10-CM | POA: Diagnosis not present

## 2020-08-05 NOTE — Progress Notes (Signed)
Subjective:  Patient ID: Duane Ortiz, male    DOB: 1950-10-19,  MRN: [id number]  Chief Complaint  Patient presents with   Routine Post Op    POS     DOS: 07/01/2020 Procedure: Left great toe amputation  70 y.o. male returns for post-op check.  Patient states is doing well.  He denies any other acute complaints.  Is ambulating in surgical shoe.  He would like to discuss next treatment plans.  Review of Systems: Negative except as noted in the HPI. Denies N/V/F/Ch.  Past Medical History:  Diagnosis Date   Arthritis    Chronic kidney disease    Chronic kidney disease    Diabetes ([place]) 02/012017   Fainting    Gait abnormality 10/11/2016   GERD (gastroesophageal reflux disease)    Gout    Headache    Hypercholesteremia    Hypercholesterolemia    Hypertension    Peripheral neuropathy 04/02/2019   Pulmonary embolism (HCC)    Shortness of breath    Sleep apnea    does wear cpap   Wears dentures     Current Outpatient Medications:    allopurinol (ZYLOPRIM) 100 MG tablet, Take 100 mg by mouth at bedtime., Disp: , Rfl:    amLODipine (NORVASC) 10 MG tablet, Take 10 mg by mouth at bedtime., Disp: , Rfl:    amLODipine (NORVASC) 5 MG tablet, Take 1 tablet (5 mg total) by mouth daily., Disp: 30 tablet, Rfl: 1   amoxicillin-clavulanate (AUGMENTIN) 875-125 MG tablet, Take 1 tablet by mouth every 12 (twelve) hours., Disp: , Rfl:    apixaban (ELIQUIS) 5 MG TABS tablet, Take 1 tablet (5 mg total) by mouth 2 (two) times daily. (Patient not taking: No sig reported), Disp: 60 tablet, Rfl: 1   ferric citrate (AURYXIA) 1 GM 210 MG(Fe) tablet, Take 420 mg by mouth 3 (three) times daily with meals., Disp: , Rfl:    furosemide (LASIX) 20 MG tablet, Take 20 mg by mouth at bedtime., Disp: , Rfl:    hydrALAZINE (APRESOLINE) 25 MG tablet, Take 1 tablet (25 mg total) by mouth 3 (three) times daily. (Patient not taking: No sig reported), Disp: 90 tablet, Rfl: 1   nebivolol (BYSTOLIC) 10 MG tablet,  Take 10 mg by mouth at bedtime. , Disp: , Rfl:    oxyCODONE-acetaminophen (PERCOCET) 5-325 MG tablet, Take 1-2 tablets by mouth every 4 (four) hours as needed for severe pain., Disp: 30 tablet, Rfl: 0   tamsulosin (FLOMAX) 0.4 MG CAPS capsule, Take 0.4 mg by mouth at bedtime. , Disp: , Rfl:   Social History   Tobacco Use  Smoking Status Some Days   Packs/day: 0.25   Types: Cigarettes  Smokeless Tobacco Never    Allergies  Allergen Reactions   Darvon [Propoxyphene] Itching   Baclofen Other (See Comments)    confusion   Gabapentin     Felt like I was out of my head, slept all day, confusion    Sulfa Antibiotics Itching   Objective:  There were no vitals filed for this visit. There is no height or weight on file to calculate BMI. Constitutional Well developed. Well nourished.  Vascular Foot warm and well perfused. Capillary refill normal to all digits.   Neurologic Normal speech. Oriented to person, place, and time. Epicritic sensation to light touch grossly present bilaterally.  Dermatologic Skin completely epithelialized.  No clinical signs of infection noted.  No further wound or recurrence of wound noted.  Orthopedic: No tenderness to palpation noted  about the surgical site.   Radiographs: 3 views of skeletally mature adult left foot: Interval amputation noted of the left hallux.  No osteomyelitic changes noted.  No soft tissue emphysema noted. Assessment:   No diagnosis found.   Plan:  Patient was evaluated and treated and all questions answered.  S/p foot surgery left -Clinically healed and we epithelialized.  At this time I discussed shoe gear modification extensive detail patient states understanding Hammertoe contractures 2 through 5 bilaterally -External patient the etiology of ankle contracture given with his foot deformities present with a setting of previous ulceration I believe patient will benefit from diabetic shoes.  He will be scheduled to see EJ for  diabetic shoes.  No follow-ups on file.

## 2020-09-14 ENCOUNTER — Telehealth: Payer: Self-pay | Admitting: *Deleted

## 2020-09-14 ENCOUNTER — Other Ambulatory Visit: Payer: Medicare HMO

## 2020-09-14 NOTE — Telephone Encounter (Signed)
Diabetic shoe measurement-09/14/20

## 2020-09-14 NOTE — Telephone Encounter (Signed)
Patient came in today to measure for diabetic shoes today, has a sore on the left 3rd toe that has developed. It is sore but no drainage noticed. Please schedule for a sooner appointment.

## 2020-09-18 ENCOUNTER — Other Ambulatory Visit: Payer: Medicare HMO

## 2020-09-23 ENCOUNTER — Ambulatory Visit (INDEPENDENT_AMBULATORY_CARE_PROVIDER_SITE_OTHER): Payer: Medicare HMO | Admitting: Podiatry

## 2020-09-23 ENCOUNTER — Other Ambulatory Visit: Payer: Self-pay

## 2020-09-23 ENCOUNTER — Encounter: Payer: Self-pay | Admitting: Podiatry

## 2020-09-23 DIAGNOSIS — L97521 Non-pressure chronic ulcer of other part of left foot limited to breakdown of skin: Secondary | ICD-10-CM

## 2020-09-23 DIAGNOSIS — Z992 Dependence on renal dialysis: Secondary | ICD-10-CM

## 2020-09-23 DIAGNOSIS — N186 End stage renal disease: Secondary | ICD-10-CM

## 2020-09-23 DIAGNOSIS — E1122 Type 2 diabetes mellitus with diabetic chronic kidney disease: Secondary | ICD-10-CM

## 2020-09-23 NOTE — Progress Notes (Signed)
Subjective:  Patient ID: Duane Ortiz, male    DOB: 08/26/1950,  MRN: [id number]  Chief Complaint  Patient presents with   Toe Pain    Left foot 2nd toe sore on the bottom causing discomfort     70 y.o. male presents for wound care.  Patient presents with new complaint left second digit ulcer.  Patient states been going for 3 weeks has progressed to gotten worse more sore.  He is doing well from his amputation site of the great toe by me.  He would like to discuss treatment options he has not seen anyone else prior to seeing me for this.  He does not want to lose another toe.  He denies any other acute complaints.   Review of Systems: Negative except as noted in the HPI. Denies N/V/F/Ch.  Past Medical History:  Diagnosis Date   Arthritis    Chronic kidney disease    Chronic kidney disease    Diabetes ([place]) 02/012017   Fainting    Gait abnormality 10/11/2016   GERD (gastroesophageal reflux disease)    Gout    Headache    Hypercholesteremia    Hypercholesterolemia    Hypertension    Peripheral neuropathy 04/02/2019   Pulmonary embolism (HCC)    Shortness of breath    Sleep apnea    does wear cpap   Wears dentures     Current Outpatient Medications:    allopurinol (ZYLOPRIM) 100 MG tablet, Take 100 mg by mouth at bedtime., Disp: , Rfl:    amLODipine (NORVASC) 10 MG tablet, Take 10 mg by mouth at bedtime., Disp: , Rfl:    amLODipine (NORVASC) 5 MG tablet, Take 1 tablet (5 mg total) by mouth daily., Disp: 30 tablet, Rfl: 1   amoxicillin-clavulanate (AUGMENTIN) 875-125 MG tablet, Take 1 tablet by mouth every 12 (twelve) hours., Disp: , Rfl:    apixaban (ELIQUIS) 5 MG TABS tablet, Take 1 tablet (5 mg total) by mouth 2 (two) times daily. (Patient not taking: No sig reported), Disp: 60 tablet, Rfl: 1   ferric citrate (AURYXIA) 1 GM 210 MG(Fe) tablet, Take 420 mg by mouth 3 (three) times daily with meals., Disp: , Rfl:    furosemide (LASIX) 20 MG tablet, Take 20 mg by mouth at  bedtime., Disp: , Rfl:    hydrALAZINE (APRESOLINE) 25 MG tablet, Take 1 tablet (25 mg total) by mouth 3 (three) times daily. (Patient not taking: No sig reported), Disp: 90 tablet, Rfl: 1   nebivolol (BYSTOLIC) 10 MG tablet, Take 10 mg by mouth at bedtime. , Disp: , Rfl:    oxyCODONE-acetaminophen (PERCOCET) 5-325 MG tablet, Take 1-2 tablets by mouth every 4 (four) hours as needed for severe pain., Disp: 30 tablet, Rfl: 0   tamsulosin (FLOMAX) 0.4 MG CAPS capsule, Take 0.4 mg by mouth at bedtime. , Disp: , Rfl:   Social History   Tobacco Use  Smoking Status Some Days   Packs/day: 0.25   Types: Cigarettes  Smokeless Tobacco Never    Allergies  Allergen Reactions   Darvon [Propoxyphene] Itching   Baclofen Other (See Comments)    confusion   Gabapentin     Felt like I was out of my head, slept all day, confusion    Sulfa Antibiotics Itching   Objective:  There were no vitals filed for this visit. There is no height or weight on file to calculate BMI. Constitutional Well developed. Well nourished.  Vascular Dorsalis pedis pulses palpable bilaterally. Posterior tibial pulses palpable  bilaterally. Capillary refill normal to all digits.  No cyanosis or clubbing noted. Pedal hair growth normal.  Neurologic Normal speech. Oriented to person, place, and time. Protective sensation absent  Dermatologic Wound Location: Left second digit limited to the breakdown of the skin.  It does not probe down to deep tissue.  No malodor present no purulent drainage noted. Wound Base: Mixed Granular/Fibrotic Peri-wound: Calloused Exudate: Scant/small amount Serous exudate Wound Measurements: -See below  Orthopedic: No pain to palpation either foot.   Radiographs: None Assessment:   1. Skin ulcer of second toe, left, limited to breakdown of skin ([place])   2. Type 2 diabetes mellitus with chronic kidney disease on chronic dialysis, without long-term current use of insulin ([place])    Plan:   Patient was evaluated and treated and all questions answered.  Ulcer left second digit ulceration limited to the breakdown of the skin -Debridement as below. -Dressed with Betadine wet-to-dry, DSD. -Continue off-loading with surgical shoe.  Procedure: Excisional Debridement of Wound Tool: Sharp chisel blade/tissue nipper Rationale: Removal of non-viable soft tissue from the wound to promote healing.  Anesthesia: none Pre-Debridement Wound Measurements: 0.3 cm x 0.2 cm x 0.3 cm  Post-Debridement Wound Measurements: 0.3 cm x 0.3 cm x 0.3 cm  Type of Debridement: Sharp Excisional Tissue Removed: Non-viable soft tissue Blood loss: Minimal (<50cc) Depth of Debridement: subcutaneous tissue. Technique: Sharp excisional debridement to bleeding, viable wound base.  Wound Progress: This is my initial evaluation of continue to monitor the progression of the wound. Site healing conversation 7 Dressing: Dry, sterile, compression dressing. Disposition: Patient tolerated procedure well. Patient to return in 1 week for follow-up.  No follow-ups on file.

## 2020-10-12 ENCOUNTER — Telehealth: Payer: Self-pay | Admitting: Podiatry

## 2020-10-12 NOTE — Telephone Encounter (Signed)
Received a call from [facility] @ [place] mcr intake specialist stating the L5000 was approved. Auth # [id number] valid 10.4 2022 thru 1.1.2023

## 2020-10-16 ENCOUNTER — Encounter: Payer: Self-pay | Admitting: Podiatry

## 2020-10-16 ENCOUNTER — Other Ambulatory Visit: Payer: Self-pay

## 2020-10-16 ENCOUNTER — Ambulatory Visit (INDEPENDENT_AMBULATORY_CARE_PROVIDER_SITE_OTHER): Payer: Medicare HMO | Admitting: Podiatry

## 2020-10-16 DIAGNOSIS — L603 Nail dystrophy: Secondary | ICD-10-CM | POA: Diagnosis not present

## 2020-10-16 DIAGNOSIS — L97521 Non-pressure chronic ulcer of other part of left foot limited to breakdown of skin: Secondary | ICD-10-CM | POA: Diagnosis not present

## 2020-10-16 DIAGNOSIS — N186 End stage renal disease: Secondary | ICD-10-CM

## 2020-10-16 DIAGNOSIS — E1122 Type 2 diabetes mellitus with diabetic chronic kidney disease: Secondary | ICD-10-CM | POA: Diagnosis not present

## 2020-10-16 DIAGNOSIS — Z992 Dependence on renal dialysis: Secondary | ICD-10-CM

## 2020-10-16 NOTE — Progress Notes (Signed)
Subjective:  Patient ID: Duane Ortiz, male    DOB: 12-14-1950,  MRN: [id number]  Chief Complaint  Patient presents with   Foot Ulcer    Left foot 2nd toe ulcer     70 y.o. male presents for wound care.  Patient presents with new complaint left second digit ulcer.  Patient states been going for 3 weeks has progressed to gotten worse more sore.  He is doing well from his amputation site of the great toe by me.  He would like to discuss treatment options he has not seen anyone else prior to seeing me for this.  He does not want to lose another toe.  He denies any other acute complaints.   Review of Systems: Negative except as noted in the HPI. Denies N/V/F/Ch.  Past Medical History:  Diagnosis Date   Arthritis    Chronic kidney disease    Chronic kidney disease    Diabetes ([place]) 02/012017   Fainting    Gait abnormality 10/11/2016   GERD (gastroesophageal reflux disease)    Gout    Headache    Hypercholesteremia    Hypercholesterolemia    Hypertension    Peripheral neuropathy 04/02/2019   Pulmonary embolism (HCC)    Shortness of breath    Sleep apnea    does wear cpap   Wears dentures     Current Outpatient Medications:    allopurinol (ZYLOPRIM) 100 MG tablet, Take 100 mg by mouth at bedtime., Disp: , Rfl:    amLODipine (NORVASC) 10 MG tablet, Take 10 mg by mouth at bedtime., Disp: , Rfl:    amLODipine (NORVASC) 5 MG tablet, Take 1 tablet (5 mg total) by mouth daily., Disp: 30 tablet, Rfl: 1   amoxicillin-clavulanate (AUGMENTIN) 875-125 MG tablet, Take 1 tablet by mouth every 12 (twelve) hours., Disp: , Rfl:    apixaban (ELIQUIS) 5 MG TABS tablet, Take 1 tablet (5 mg total) by mouth 2 (two) times daily. (Patient not taking: No sig reported), Disp: 60 tablet, Rfl: 1   ferric citrate (AURYXIA) 1 GM 210 MG(Fe) tablet, Take 420 mg by mouth 3 (three) times daily with meals., Disp: , Rfl:    furosemide (LASIX) 20 MG tablet, Take 20 mg by mouth at bedtime., Disp: , Rfl:     hydrALAZINE (APRESOLINE) 25 MG tablet, Take 1 tablet (25 mg total) by mouth 3 (three) times daily. (Patient not taking: No sig reported), Disp: 90 tablet, Rfl: 1   nebivolol (BYSTOLIC) 10 MG tablet, Take 10 mg by mouth at bedtime. , Disp: , Rfl:    oxyCODONE-acetaminophen (PERCOCET) 5-325 MG tablet, Take 1-2 tablets by mouth every 4 (four) hours as needed for severe pain., Disp: 30 tablet, Rfl: 0   tamsulosin (FLOMAX) 0.4 MG CAPS capsule, Take 0.4 mg by mouth at bedtime. , Disp: , Rfl:   Social History   Tobacco Use  Smoking Status Some Days   Packs/day: 0.25   Types: Cigarettes  Smokeless Tobacco Never    Allergies  Allergen Reactions   Darvon [Propoxyphene] Itching   Baclofen Other (See Comments)    confusion   Gabapentin     Felt like I was out of my head, slept all day, confusion    Sulfa Antibiotics Itching   Objective:  There were no vitals filed for this visit. There is no height or weight on file to calculate BMI. Constitutional Well developed. Well nourished.  Vascular Dorsalis pedis pulses palpable bilaterally. Posterior tibial pulses palpable bilaterally. Capillary refill normal to  all digits.  No cyanosis or clubbing noted. Pedal hair growth normal.  Neurologic Normal speech. Oriented to person, place, and time. Protective sensation absent  Dermatologic Wound Location: Left second digit limited to the breakdown of the skin.  It does not probe down to deep tissue.  No malodor present no purulent drainage noted. Wound Base: Mixed Granular/Fibrotic Peri-wound: Calloused Exudate: Scant/small amount Serous exudate Wound Measurements: -See below  Left second digit appears to be contused and avulsing.  No clinical signs of infection noted.  At this time patient will benefit from removal of the nail given the avulsing of the nail with underlying wound.  Orthopedic: No pain to palpation either foot.   Radiographs: None Assessment:   1. Skin ulcer of second toe,  left, limited to breakdown of skin ([place])   2. Type 2 diabetes mellitus with chronic kidney disease on chronic dialysis, without long-term current use of insulin ([place])   3. Nail dystrophy     Plan:  Patient was evaluated and treated and all questions answered.  Ulcer left second digit ulceration limited to the breakdown of the skin -Debridement as below. -Dressed with Betadine wet-to-dry, DSD. -Continue off-loading with surgical shoe.  Nail contusion/dystrophy second, left -Patient elects to proceed with minor surgery to remove entire toenail today. Consent reviewed and signed by patient. -Entire/total nail excised. See procedure note. -Educated on post-procedure care including soaking. Written instructions provided and reviewed. -Patient to follow up in 2 weeks for nail check. -Patient is a high risk of losing the second digit given the discoloration of the digit as well as avulsing of the nail.  Procedure: Excision of entire/total nail  Location: left 2nd toe digit Anesthesia: Lidocaine 1% plain; 1.5 mL and Marcaine 0.5% plain; 1.5 mL, digital block. Skin Prep: Betadine. Dressing: Silvadene; telfa; dry, sterile, compression dressing. Technique: Following skin prep, the toe was exsanguinated and a tourniquet was secured at the base of the toe. The affected nail border was freed and excised. The tourniquet was then removed and sterile dressing applied. Disposition: Patient tolerated procedure well. Patient to return in 2 weeks for follow-up.   No follow-ups on file.   Procedure: Excisional Debridement of Wound Tool: Sharp chisel blade/tissue nipper Rationale: Removal of non-viable soft tissue from the wound to promote healing.  Anesthesia: none Pre-Debridement Wound Measurements: 0.2 cm x 0.2 cm x 0.3 cm  Post-Debridement Wound Measurements: 0.2 cm x 0.2 cm x 0.3 cm  Type of Debridement: Sharp Excisional Tissue Removed: Non-viable soft tissue Blood loss: Minimal (<50cc) Depth  of Debridement: subcutaneous tissue. Technique: Sharp excisional debridement to bleeding, viable wound base.  Wound Progress: The wound is decreasing. Dressing: Dry, sterile, compression dressing. Disposition: Patient tolerated procedure well. Patient to return in 1 week for follow-up.  No follow-ups on file.

## 2020-11-06 ENCOUNTER — Ambulatory Visit (INDEPENDENT_AMBULATORY_CARE_PROVIDER_SITE_OTHER): Payer: Medicare HMO

## 2020-11-06 ENCOUNTER — Other Ambulatory Visit: Payer: Self-pay

## 2020-11-06 ENCOUNTER — Ambulatory Visit (INDEPENDENT_AMBULATORY_CARE_PROVIDER_SITE_OTHER): Payer: Medicare HMO | Admitting: Podiatry

## 2020-11-06 DIAGNOSIS — Z992 Dependence on renal dialysis: Secondary | ICD-10-CM | POA: Diagnosis not present

## 2020-11-06 DIAGNOSIS — N186 End stage renal disease: Secondary | ICD-10-CM

## 2020-11-06 DIAGNOSIS — E1122 Type 2 diabetes mellitus with diabetic chronic kidney disease: Secondary | ICD-10-CM

## 2020-11-06 DIAGNOSIS — L97521 Non-pressure chronic ulcer of other part of left foot limited to breakdown of skin: Secondary | ICD-10-CM

## 2020-11-06 NOTE — Progress Notes (Signed)
Subjective:  Patient ID: Duane Ortiz, male    DOB: 04-13-1950,  MRN: [id number]  Chief Complaint  Patient presents with   Wound Check    Ulcer of left foot 2nd toe     70 y.o. male presents for wound care.  Patient presents with new complaint left second digit ulcer.  Patient states that he was looking little bit better he is feeling a little bit better has been doing Betadine wet-to-dry he denies any other acute complaints.  His great amputation site is doing well that was done by me.   Review of Systems: Negative except as noted in the HPI. Denies N/V/F/Ch.  Past Medical History:  Diagnosis Date   Arthritis    Chronic kidney disease    Chronic kidney disease    Diabetes ([place]) 02/012017   Fainting    Gait abnormality 10/11/2016   GERD (gastroesophageal reflux disease)    Gout    Headache    Hypercholesteremia    Hypercholesterolemia    Hypertension    Peripheral neuropathy 04/02/2019   Pulmonary embolism (HCC)    Shortness of breath    Sleep apnea    does wear cpap   Wears dentures     Current Outpatient Medications:    allopurinol (ZYLOPRIM) 100 MG tablet, Take 100 mg by mouth at bedtime., Disp: , Rfl:    amLODipine (NORVASC) 10 MG tablet, Take 10 mg by mouth at bedtime., Disp: , Rfl:    amLODipine (NORVASC) 5 MG tablet, Take 1 tablet (5 mg total) by mouth daily., Disp: 30 tablet, Rfl: 1   amoxicillin-clavulanate (AUGMENTIN) 875-125 MG tablet, Take 1 tablet by mouth every 12 (twelve) hours., Disp: , Rfl:    apixaban (ELIQUIS) 5 MG TABS tablet, Take 1 tablet (5 mg total) by mouth 2 (two) times daily. (Patient not taking: No sig reported), Disp: 60 tablet, Rfl: 1   ferric citrate (AURYXIA) 1 GM 210 MG(Fe) tablet, Take 420 mg by mouth 3 (three) times daily with meals., Disp: , Rfl:    furosemide (LASIX) 20 MG tablet, Take 20 mg by mouth at bedtime., Disp: , Rfl:    hydrALAZINE (APRESOLINE) 25 MG tablet, Take 1 tablet (25 mg total) by mouth 3 (three) times daily.  (Patient not taking: No sig reported), Disp: 90 tablet, Rfl: 1   nebivolol (BYSTOLIC) 10 MG tablet, Take 10 mg by mouth at bedtime. , Disp: , Rfl:    oxyCODONE-acetaminophen (PERCOCET) 5-325 MG tablet, Take 1-2 tablets by mouth every 4 (four) hours as needed for severe pain., Disp: 30 tablet, Rfl: 0   tamsulosin (FLOMAX) 0.4 MG CAPS capsule, Take 0.4 mg by mouth at bedtime. , Disp: , Rfl:   Social History   Tobacco Use  Smoking Status Some Days   Packs/day: 0.25   Types: Cigarettes  Smokeless Tobacco Never    Allergies  Allergen Reactions   Darvon [Propoxyphene] Itching   Baclofen Other (See Comments)    confusion   Gabapentin     Felt like I was out of my head, slept all day, confusion    Sulfa Antibiotics Itching   Objective:  There were no vitals filed for this visit. There is no height or weight on file to calculate BMI. Constitutional Well developed. Well nourished.  Vascular Dorsalis pedis pulses palpable bilaterally. Posterior tibial pulses palpable bilaterally. Capillary refill normal to all digits.  No cyanosis or clubbing noted. Pedal hair growth normal.  Neurologic Normal speech. Oriented to person, place, and time. Protective sensation  absent  Dermatologic Wound Location: Left second digit limited to the breakdown of the skin.  It does not probe down to deep tissue.  No malodor present no purulent drainage noted. Wound Base: Mixed Granular/Fibrotic Peri-wound: Calloused Exudate: Scant/small amount Serous exudate Wound Measurements: -See below  Left second digit appears to be contused and avulsing.  No clinical signs of infection noted.  At this time patient will benefit from removal of the nail given the avulsing of the nail with underlying wound.  Orthopedic: No pain to palpation either foot.   Radiographs: 3 views of skeletally mature adult left second toe: No osteomyelitic changes noted.  Soft tissue ulceration noted to the distal tip of the second  toe. Assessment:   1. Skin ulcer of second toe, left, limited to breakdown of skin ([place])   2. Type 2 diabetes mellitus with chronic kidney disease on chronic dialysis, without long-term current use of insulin ([place])     Plan:  Patient was evaluated and treated and all questions answered.  Ulcer left second digit ulceration limited to the breakdown of the skin -Debridement as below. -Dressed with Betadine wet-to-dry, DSD. -Continue off-loading with surgical shoe. --Patient is a high risk of losing the second digit given the discoloration of the digit as well as avulsing of the nail. -At this time I cannot clinically appreciate osteomyelitis clinically or radiographically therefore I will hold off onto amputation however if it has not improved with the increasing depth of the ulceration we will plan for an elective amputation.  I will discuss this with patient during next clinical visit.   No follow-ups on file.

## 2020-11-20 ENCOUNTER — Encounter: Payer: Self-pay | Admitting: Podiatry

## 2020-11-20 ENCOUNTER — Other Ambulatory Visit: Payer: Self-pay

## 2020-11-20 ENCOUNTER — Ambulatory Visit (HOSPITAL_COMMUNITY)
Admission: RE | Admit: 2020-11-20 | Discharge: 2020-11-20 | Disposition: A | Discharge status: Home / Self Care | Payer: Medicare HMO | Source: Ambulatory Visit | Attending: Podiatry | Admitting: Podiatry

## 2020-11-20 ENCOUNTER — Ambulatory Visit (INDEPENDENT_AMBULATORY_CARE_PROVIDER_SITE_OTHER): Payer: Medicare HMO | Admitting: Podiatry

## 2020-11-20 DIAGNOSIS — N186 End stage renal disease: Secondary | ICD-10-CM | POA: Diagnosis not present

## 2020-11-20 DIAGNOSIS — I999 Unspecified disorder of circulatory system: Secondary | ICD-10-CM | POA: Diagnosis not present

## 2020-11-20 DIAGNOSIS — E1122 Type 2 diabetes mellitus with diabetic chronic kidney disease: Secondary | ICD-10-CM

## 2020-11-20 DIAGNOSIS — L97521 Non-pressure chronic ulcer of other part of left foot limited to breakdown of skin: Secondary | ICD-10-CM

## 2020-11-20 DIAGNOSIS — Z992 Dependence on renal dialysis: Secondary | ICD-10-CM

## 2020-11-20 NOTE — Progress Notes (Signed)
Subjective:  Patient ID: Duane Ortiz, male    DOB: 10/30/1950,  MRN: [id number]  Chief Complaint  Patient presents with   Wound Check    Second toe left foot     70 y.o. male presents for wound care.  Patient presents with new complaint left second digit ulcer.  Patient states looking better.  Has been doing Betadine wet-to-dry dressing changes.  He would like to know if there is any other options that he could do.  He denies any other acute complaints.   Review of Systems: Negative except as noted in the HPI. Denies N/V/F/Ch.  Past Medical History:  Diagnosis Date   Arthritis    Chronic kidney disease    Chronic kidney disease    Diabetes ([place]) 02/012017   Fainting    Gait abnormality 10/11/2016   GERD (gastroesophageal reflux disease)    Gout    Headache    Hypercholesteremia    Hypercholesterolemia    Hypertension    Peripheral neuropathy 04/02/2019   Pulmonary embolism (HCC)    Shortness of breath    Sleep apnea    does wear cpap   Wears dentures     Current Outpatient Medications:    allopurinol (ZYLOPRIM) 100 MG tablet, Take 100 mg by mouth at bedtime., Disp: , Rfl:    amLODipine (NORVASC) 10 MG tablet, Take 10 mg by mouth at bedtime., Disp: , Rfl:    amLODipine (NORVASC) 5 MG tablet, Take 1 tablet (5 mg total) by mouth daily., Disp: 30 tablet, Rfl: 1   amoxicillin-clavulanate (AUGMENTIN) 875-125 MG tablet, Take 1 tablet by mouth every 12 (twelve) hours., Disp: , Rfl:    apixaban (ELIQUIS) 5 MG TABS tablet, Take 1 tablet (5 mg total) by mouth 2 (two) times daily. (Patient not taking: No sig reported), Disp: 60 tablet, Rfl: 1   ferric citrate (AURYXIA) 1 GM 210 MG(Fe) tablet, Take 420 mg by mouth 3 (three) times daily with meals., Disp: , Rfl:    furosemide (LASIX) 20 MG tablet, Take 20 mg by mouth at bedtime., Disp: , Rfl:    hydrALAZINE (APRESOLINE) 25 MG tablet, Take 1 tablet (25 mg total) by mouth 3 (three) times daily. (Patient not taking: No sig reported),  Disp: 90 tablet, Rfl: 1   nebivolol (BYSTOLIC) 10 MG tablet, Take 10 mg by mouth at bedtime. , Disp: , Rfl:    oxyCODONE-acetaminophen (PERCOCET) 5-325 MG tablet, Take 1-2 tablets by mouth every 4 (four) hours as needed for severe pain., Disp: 30 tablet, Rfl: 0   tamsulosin (FLOMAX) 0.4 MG CAPS capsule, Take 0.4 mg by mouth at bedtime. , Disp: , Rfl:   Social History   Tobacco Use  Smoking Status Some Days   Packs/day: 0.25   Types: Cigarettes  Smokeless Tobacco Never    Allergies  Allergen Reactions   Darvon [Propoxyphene] Itching   Baclofen Other (See Comments)    confusion   Gabapentin     Felt like I was out of my head, slept all day, confusion    Sulfa Antibiotics Itching   Objective:  There were no vitals filed for this visit. There is no height or weight on file to calculate BMI. Constitutional Well developed. Well nourished.  Vascular Dorsalis pedis pulses non palpable bilaterally. Posterior tibial pulses non palpable bilaterally. Capillary refill normal to all digits.  No cyanosis or clubbing noted. Pedal hair growth normal.  Neurologic Normal speech. Oriented to person, place, and time. Protective sensation absent  Dermatologic Wound Location:  Left second digit limited to the breakdown of the skin.  It does not probe down to deep tissue.  No malodor present no purulent drainage noted. Wound Base: Mixed Granular/Fibrotic Peri-wound: Calloused Exudate: Scant/small amount Serous exudate Wound Measurements: -See below  Left second digit appears to be contused and avulsing.  No clinical signs of infection noted.  At this time patient will benefit from removal of the nail given the avulsing of the nail with underlying wound.  Orthopedic: No pain to palpation either foot.   Radiographs: 3 views of skeletally mature adult left second toe: No osteomyelitic changes noted.  Soft tissue ulceration noted to the distal tip of the second toe. Assessment:   1. Vascular  abnormality   2. Skin ulcer of second toe, left, limited to breakdown of skin ([place])   3. Type 2 diabetes mellitus with chronic kidney disease on chronic dialysis, without long-term current use of insulin ([place])     Plan:  Patient was evaluated and treated and all questions answered.  Ulcer left second digit ulceration limited to the breakdown of the skin -Debridement as below. -Dressed with Betadine wet-to-dry, DSD. -Continue off-loading with surgical shoe. --Patient is a high risk of losing the second digit given the discoloration of the digit as well as avulsing of the nail. -At this time I cannot clinically appreciate osteomyelitis clinically or radiographically therefore I will hold off onto amputation however if it has not improved with the increasing depth of the ulceration we will plan for an elective amputation.  Prior to undergoing elective amputation I would like to rule out circulation/peripheral arterial disease as patient's circulation status status has changed recently.  Vascular abnormality -ABIs PVRs were ordered.  Patient's vascular status appears to have changed in the last few weeks.  There is more discoloration present at the toe.  I believe he will benefit from vascular work-up prior to elective amputation.  Patient states understanding.   No follow-ups on file.

## 2020-12-02 ENCOUNTER — Telehealth: Payer: Self-pay | Admitting: Podiatry

## 2020-12-02 NOTE — Telephone Encounter (Signed)
Called pt to discuss diabetic shoes as the authorization from the pcp has expired due to last office visit was 5.2022. I asked him if he has seen the pcp since then and he said no that he does not care for him, he does not do anything for him. I did tell him per medicare we have to get an updated appt that qualifying pt for the shoes. He stated he needs his shoes and I understand but we must follow the medicare guidelines.He stated he was going to go by there some time next week. I asked pt to let me know and I will fax the needed paperwork.

## 2020-12-07 ENCOUNTER — Telehealth: Payer: Self-pay | Admitting: Podiatry

## 2020-12-07 NOTE — Telephone Encounter (Signed)
Pt called to let me know he has an appt with pcp this Friday and I told him I would fax the documents needed for his shoes with the appt on the forms.

## 2020-12-09 ENCOUNTER — Ambulatory Visit (INDEPENDENT_AMBULATORY_CARE_PROVIDER_SITE_OTHER): Payer: Medicare HMO | Admitting: Podiatry

## 2020-12-09 ENCOUNTER — Other Ambulatory Visit: Payer: Self-pay

## 2020-12-09 DIAGNOSIS — I999 Unspecified disorder of circulatory system: Secondary | ICD-10-CM

## 2020-12-09 DIAGNOSIS — L97521 Non-pressure chronic ulcer of other part of left foot limited to breakdown of skin: Secondary | ICD-10-CM | POA: Diagnosis not present

## 2020-12-09 DIAGNOSIS — N186 End stage renal disease: Secondary | ICD-10-CM | POA: Diagnosis not present

## 2020-12-09 DIAGNOSIS — E1122 Type 2 diabetes mellitus with diabetic chronic kidney disease: Secondary | ICD-10-CM | POA: Diagnosis not present

## 2020-12-09 DIAGNOSIS — Z992 Dependence on renal dialysis: Secondary | ICD-10-CM

## 2020-12-09 NOTE — Progress Notes (Signed)
Subjective:  Patient ID: Duane Ortiz, male    DOB: 14-Jul-1950,  MRN: [id number]  Chief Complaint  Patient presents with   Wound Check    Left foot second toe skin ulcer     70 y.o. male presents for wound care.  Patient presents with new complaint left second digit ulcer.  Patient states looking better.  He has been doing Betadine wet-to-dry dressing changes.  He states that is looking about the same has not gotten any better at this time is ready to have the second digit amputated.  He had his ABIs PVR study done and he is here to go over the results.   Review of Systems: Negative except as noted in the HPI. Denies N/V/F/Ch.  Past Medical History:  Diagnosis Date   Arthritis    Chronic kidney disease    Chronic kidney disease    Diabetes ([place]) 02/012017   Fainting    Gait abnormality 10/11/2016   GERD (gastroesophageal reflux disease)    Gout    Headache    Hypercholesteremia    Hypercholesterolemia    Hypertension    Peripheral neuropathy 04/02/2019   Pulmonary embolism (H[facility])    Shortness of breath    Sleep apnea    does wear cpap   Wears dentures     Current Outpatient Medications:    allopurinol (ZYLOPRIM) 100 MG tablet, Take 100 mg by mouth at bedtime., Disp: , Rfl:    amLODipine (NORVASC) 10 MG tablet, Take 10 mg by mouth at bedtime., Disp: , Rfl:    amLODipine (NORVASC) 5 MG tablet, Take 1 tablet (5 mg total) by mouth daily., Disp: 30 tablet, Rfl: 1   amoxicillin-clavulanate (AUGMENTIN) 875-125 MG tablet, Take 1 tablet by mouth every 12 (twelve) hours., Disp: , Rfl:    apixaban (ELIQUIS) 5 MG TABS tablet, Take 1 tablet (5 mg total) by mouth 2 (two) times daily. (Patient not taking: No sig reported), Disp: 60 tablet, Rfl: 1   ferric citrate (AURYXIA) 1 GM 210 MG(Fe) tablet, Take 420 mg by mouth 3 (three) times daily with meals., Disp: , Rfl:    furosemide (LASIX) 20 MG tablet, Take 20 mg by mouth at bedtime., Disp: , Rfl:    hydrALAZINE (APRESOLINE) 25 MG tablet,  Take 1 tablet (25 mg total) by mouth 3 (three) times daily. (Patient not taking: No sig reported), Disp: 90 tablet, Rfl: 1   nebivolol (BYSTOLIC) 10 MG tablet, Take 10 mg by mouth at bedtime. , Disp: , Rfl:    oxyCODONE-acetaminophen (PERCOCET) 5-325 MG tablet, Take 1-2 tablets by mouth every 4 (four) hours as needed for severe pain., Disp: 30 tablet, Rfl: 0   tamsulosin (FLOMAX) 0.4 MG CAPS capsule, Take 0.4 mg by mouth at bedtime. , Disp: , Rfl:   Social History   Tobacco Use  Smoking Status Some Days   Packs/day: 0.25   Types: Cigarettes  Smokeless Tobacco Never    Allergies  Allergen Reactions   Darvon [Propoxyphene] Itching   Baclofen Other (See Comments)    confusion   Gabapentin     Felt like I was out of my head, slept all day, confusion    Sulfa Antibiotics Itching   Objective:  There were no vitals filed for this visit. There is no height or weight on file to calculate BMI. Constitutional Well developed. Well nourished.  Vascular Dorsalis pedis pulses non palpable bilaterally. Posterior tibial pulses non palpable bilaterally. Capillary refill normal to all digits.  No cyanosis or clubbing  noted. Pedal hair growth normal.  Neurologic Normal speech. Oriented to person, place, and time. Protective sensation absent  Dermatologic Wound Location: Left second digit limited to the breakdown of the skin.  It does not probe down to deep tissue.  No malodor present no purulent drainage noted. Wound Base: Mixed Granular/Fibrotic Peri-wound: Calloused Exudate: Scant/small amount Serous exudate Wound Measurements: -See below  Left second digit appears to be contused and avulsing.  No clinical signs of infection noted.  At this time patient will benefit from removal of the nail given the avulsing of the nail with underlying wound.  Orthopedic: No pain to palpation either foot.   Radiographs: 3 views of skeletally mature adult left second toe: No osteomyelitic changes noted.   Soft tissue ulceration noted to the distal tip of the second toe. Assessment:   1. Vascular abnormality   2. Skin ulcer of second toe, left, limited to breakdown of skin ([place])   3. Type 2 diabetes mellitus with chronic kidney disease on chronic dialysis, without long-term current use of insulin ([place])      Plan:  Patient was evaluated and treated and all questions answered.  Ulcer left second digit ulceration limited to the breakdown of the skin -Continue Betadine wet-to-dry dressing changes. -I discussed with the patient that he would benefit from left second digit amputation disarticulation to elation of the MPJ joint secondary to discoloration of the dorsal skin.  I discussed this with patient in extensive detail he states understanding.  He would like to hold off until after New Year's.  I discussed with him he still a high risk of losing that digit if it gets worse or becomes more purulent in nature I discussed over the emergency room right away he states understanding -He will be scheduled on January 9 for left second digit amputation. -I discussed my preoperative postoperative and intraoperative plan in extensive detail he can be weightbearing as tolerated surgical shoe after the surgery.  He states understanding -Informed surgical risk consent was reviewed and read aloud to the patient.  I reviewed the films.  I have discussed my findings with the patient in great detail.  I have discussed all risks including but not limited to infection, stiffness, scarring, limp, disability, deformity, damage to blood vessels and nerves, numbness, poor healing, need for braces, arthritis, chronic pain, amputation, death.  All benefits and realistic expectations discussed in great detail.  I have made no promises as to the outcome.  I have provided realistic expectations.  I have offered the patient a 2nd opinion, which they have declined and assured me they preferred to proceed despite the  risks   Vascular abnormality -ABIs PVRs were reviewed.  Patient does not have any vascular compromise and has the best flow possible to that toe to heal the amputation site.   No follow-ups on file.

## 2020-12-18 ENCOUNTER — Telehealth: Payer: Self-pay | Admitting: Podiatry

## 2020-12-18 NOTE — Telephone Encounter (Signed)
DOS: 01/11/2021  Amputation Toe MPJ 2nd Left - 28820  [employer] Medicare Effective: 01/03/2021  Deductible: $0 Out of Pocket: $3,400 with $0 met. CoInsurance: 0% Copay: $245  Per [employer] Website Prior Auth is NOT required.

## 2021-01-07 ENCOUNTER — Telehealth: Payer: Self-pay | Admitting: Podiatry

## 2021-01-07 NOTE — Telephone Encounter (Signed)
Pt left message without name just a number to call back about his diabetic shoes.  I returned call and explained that we have not gotten the documents from the pcp and I faxed it on 12.5 with a note for the office that pt had an appt on 12.9 @ 1130. I explained that we cannot dispense shoes as medicare will not cover them due to not having needed documentation. He said he knew they would not sign it and said he would talk to our doctor here at his appt. I tried to explain that our doctor cannot override the medicare policy and he said that he was going to talk to him.

## 2021-01-11 ENCOUNTER — Encounter: Payer: Self-pay | Admitting: Podiatry

## 2021-01-11 DIAGNOSIS — M86672 Other chronic osteomyelitis, left ankle and foot: Secondary | ICD-10-CM

## 2021-01-13 ENCOUNTER — Telehealth: Payer: Self-pay | Admitting: *Deleted

## 2021-01-13 ENCOUNTER — Other Ambulatory Visit: Payer: Self-pay | Admitting: Podiatry

## 2021-01-13 MED ORDER — OXYCODONE-ACETAMINOPHEN 5-325 MG PO TABS: 1.0000 | ORAL_TABLET | ORAL | 0 refills | Status: DC | PRN

## 2021-01-13 NOTE — Telephone Encounter (Signed)
Patient is calling to request pain medicine, recent toe amputation,please advise.

## 2021-01-15 ENCOUNTER — Telehealth: Payer: Self-pay | Admitting: Podiatry

## 2021-01-15 NOTE — Telephone Encounter (Signed)
Per Eve @ [employer] auth for the L5000(DME) is valid 10.4.2022 thru 1.20.2023.Edwyna Ready # [id number].Marland KitchenMarland Kitchen

## 2021-01-18 ENCOUNTER — Telehealth: Payer: Self-pay | Admitting: Podiatry

## 2021-01-18 ENCOUNTER — Encounter: Payer: Self-pay | Admitting: Podiatry

## 2021-01-20 ENCOUNTER — Ambulatory Visit (INDEPENDENT_AMBULATORY_CARE_PROVIDER_SITE_OTHER): Payer: Medicare HMO | Admitting: Podiatry

## 2021-01-20 ENCOUNTER — Other Ambulatory Visit: Payer: Self-pay

## 2021-01-20 ENCOUNTER — Ambulatory Visit: Payer: Medicare HMO

## 2021-01-20 ENCOUNTER — Ambulatory Visit (INDEPENDENT_AMBULATORY_CARE_PROVIDER_SITE_OTHER): Payer: Medicare HMO

## 2021-01-20 DIAGNOSIS — M2041 Other hammer toe(s) (acquired), right foot: Secondary | ICD-10-CM

## 2021-01-20 DIAGNOSIS — Z89422 Acquired absence of other left toe(s): Secondary | ICD-10-CM

## 2021-01-20 DIAGNOSIS — E1122 Type 2 diabetes mellitus with diabetic chronic kidney disease: Secondary | ICD-10-CM

## 2021-01-20 DIAGNOSIS — L97521 Non-pressure chronic ulcer of other part of left foot limited to breakdown of skin: Secondary | ICD-10-CM

## 2021-01-20 DIAGNOSIS — Z89412 Acquired absence of left great toe: Secondary | ICD-10-CM

## 2021-01-20 DIAGNOSIS — M2042 Other hammer toe(s) (acquired), left foot: Secondary | ICD-10-CM

## 2021-01-20 DIAGNOSIS — E11621 Type 2 diabetes mellitus with foot ulcer: Secondary | ICD-10-CM

## 2021-01-20 MED ORDER — HYDROCODONE-ACETAMINOPHEN 5-325 MG PO TABS: 1.0000 | ORAL_TABLET | Freq: Four times a day (QID) | ORAL | 0 refills | Status: DC | PRN

## 2021-01-20 NOTE — Progress Notes (Signed)
Subjective:  Patient ID: Duane Ortiz, male    DOB: 1950/10/08,  MRN: [id number]  Chief Complaint  Patient presents with   Nail Problem   Routine Post Op    POV #1 DOS 01/11/2021 LT 2ND DIGIT AMPUTATION    DOS: 01/11/2021 Procedure: Left second digit amputation  71 y.o. male returns for post-op check.  Patient states that he is doing well.  He started getting little itching from Percocet 1 every can switch over the medication.  He has been ambulating with surgical shoe.  He denies any other acute complaints.  Review of Systems: Negative except as noted in the HPI. Denies N/V/F/Ch.  Past Medical History:  Diagnosis Date   Arthritis    Chronic kidney disease    Chronic kidney disease    Diabetes ([place]) 02/012017   Fainting    Gait abnormality 10/11/2016   GERD (gastroesophageal reflux disease)    Gout    Headache    Hypercholesteremia    Hypercholesterolemia    Hypertension    Peripheral neuropathy 04/02/2019   Pulmonary embolism (HCC)    Shortness of breath    Sleep apnea    does wear cpap   Wears dentures     Current Outpatient Medications:    allopurinol (ZYLOPRIM) 100 MG tablet, Take 100 mg by mouth at bedtime., Disp: , Rfl:    amLODipine (NORVASC) 10 MG tablet, Take 10 mg by mouth at bedtime., Disp: , Rfl:    amLODipine (NORVASC) 5 MG tablet, Take 1 tablet (5 mg total) by mouth daily., Disp: 30 tablet, Rfl: 1   amoxicillin-clavulanate (AUGMENTIN) 875-125 MG tablet, Take 1 tablet by mouth every 12 (twelve) hours., Disp: , Rfl:    apixaban (ELIQUIS) 5 MG TABS tablet, Take 1 tablet (5 mg total) by mouth 2 (two) times daily. (Patient not taking: No sig reported), Disp: 60 tablet, Rfl: 1   ferric citrate (AURYXIA) 1 GM 210 MG(Fe) tablet, Take 420 mg by mouth 3 (three) times daily with meals., Disp: , Rfl:    furosemide (LASIX) 20 MG tablet, Take 20 mg by mouth at bedtime., Disp: , Rfl:    hydrALAZINE (APRESOLINE) 25 MG tablet, Take 1 tablet (25 mg total) by mouth 3 (three)  times daily. (Patient not taking: No sig reported), Disp: 90 tablet, Rfl: 1   HYDROcodone-acetaminophen (NORCO) 5-325 MG tablet, Take 1 tablet by mouth every 6 (six) hours as needed for moderate pain., Disp: 30 tablet, Rfl: 0   nebivolol (BYSTOLIC) 10 MG tablet, Take 10 mg by mouth at bedtime. , Disp: , Rfl:    oxyCODONE-acetaminophen (PERCOCET) 5-325 MG tablet, Take 1-2 tablets by mouth every 4 (four) hours as needed for severe pain., Disp: 30 tablet, Rfl: 0   oxyCODONE-acetaminophen (PERCOCET) 5-325 MG tablet, Take 1 tablet by mouth every 4 (four) hours as needed for severe pain., Disp: 30 tablet, Rfl: 0   tamsulosin (FLOMAX) 0.4 MG CAPS capsule, Take 0.4 mg by mouth at bedtime. , Disp: , Rfl:   Social History   Tobacco Use  Smoking Status Some Days   Packs/day: 0.25   Types: Cigarettes  Smokeless Tobacco Never    Allergies  Allergen Reactions   Darvon [Propoxyphene] Itching   Baclofen Other (See Comments)    confusion   Gabapentin     Felt like I was out of my head, slept all day, confusion    Sulfa Antibiotics Itching   Objective:  There were no vitals filed for this visit. There is no height  or weight on file to calculate BMI. Constitutional Well developed. Well nourished.  Vascular Foot warm and well perfused. Capillary refill normal to all digits.   Neurologic Normal speech. Oriented to person, place, and time. Epicritic sensation to light touch grossly present bilaterally.  Dermatologic Skin healing well without signs of infection. Skin edges well coapted without signs of infection.  Orthopedic: Tenderness to palpation noted about the surgical site.   Radiographs: 3 views of skeletally mature adult left foot: Sharp surgical margins noted.  Status post amputation of the left second digit. Assessment:   1. History of amputation of lesser toe of left foot ([place])    Plan:  Patient was evaluated and treated and all questions answered.  S/p foot surgery  left -Progressing as expected post-operatively. -XR: See above -WB Status: Weightbearing as tolerated in surgical shoe -Sutures: Intact.  No clinical signs of dehiscence noted.  No complication noted. -Medications: Hydrocodone/acetaminophen 05/06/2023 -Foot redressed.  No follow-ups on file.

## 2021-01-20 NOTE — Progress Notes (Signed)
SITUATION Reason for Visit: Fitting of Diabetic Shoes & Insoles Patient / Caregiver Report:  Patient is satisfied with shoes  OBJECTIVE DATA: Patient History / Diagnosis:     ICD-10-CM   1. History of amputation of lesser toe of left foot (HCC)  Z89.422     2. Type 2 diabetes mellitus with chronic kidney disease on chronic dialysis, without long-term current use of insulin (HCC)  E11.22    N18.6    Z99.2       Change in Status:   None  ACTIONS PERFORMED: In-Person Delivery, patient was fit with: - 1x pair [id number] PDAC approved prefabricated Diabetic Shoes: Apex sneaker - 3x pair [id number] PDAC approved CAM milled custom diabetic insoles  Shoes and insoles were verified for structural integrity and safety. Patient wore shoes and insoles in office. Skin was inspected and free of areas of concern after wearing shoes and inserts. Shoes and inserts fit properly. Patient / Caregiver provided with ferbal instruction and demonstration regarding donning, doffing, wear, care, proper fit, function, purpose, cleaning, and use of shoes and insoles ' and in all related precautions and risks and benefits regarding shoes and insoles. Patient / Caregiver was instructed to wear properly fitting socks with shoes at all times. Patient was also provided with verbal instruction regarding how to report any failures or malfunctions of shoes or inserts, and necessary follow up care. Patient / Caregiver was also instructed to contact physician regarding change in status that may affect function of shoes and inserts.   Patient / Caregiver verbalized undersatnding of instruction provided. Patient / Caregiver demonstrated independence with proper donning and doffing of shoes and inserts.  PLAN Patient to follow up as needed. Plan of care was discussed with and agreed upon by patient and/or caregiver. All questions were answered and concerns addressed.

## 2021-02-03 ENCOUNTER — Ambulatory Visit (INDEPENDENT_AMBULATORY_CARE_PROVIDER_SITE_OTHER): Payer: Medicare HMO | Admitting: Podiatry

## 2021-02-03 ENCOUNTER — Other Ambulatory Visit: Payer: Self-pay

## 2021-02-03 DIAGNOSIS — Z992 Dependence on renal dialysis: Secondary | ICD-10-CM | POA: Diagnosis not present

## 2021-02-03 DIAGNOSIS — N186 End stage renal disease: Secondary | ICD-10-CM | POA: Diagnosis not present

## 2021-02-03 DIAGNOSIS — Z89422 Acquired absence of other left toe(s): Secondary | ICD-10-CM | POA: Diagnosis not present

## 2021-02-03 DIAGNOSIS — E1122 Type 2 diabetes mellitus with diabetic chronic kidney disease: Secondary | ICD-10-CM

## 2021-02-03 NOTE — Progress Notes (Signed)
Subjective:  Patient ID: Duane Ortiz, male    DOB: [birth year],  MRN: [id number]  Chief Complaint  Patient presents with   Routine Post Op     POV #2 DOS 01/11/2021 LT 2ND DIGIT AMPUTATION    DOS: 01/11/2021 Procedure: Left second digit amputation  71 y.o. male returns for post-op check.  Patient states that he is doing well.  He states he no longer has any pain.  He has been applying Betadine wet-to-dry dressing change and ambulating in surgical shoe  Review of Systems: Negative except as noted in the HPI. Denies N/V/F/Ch.  Past Medical History:  Diagnosis Date   Arthritis    Chronic kidney disease    Chronic kidney disease    Diabetes ([place]) 02/012017   Fainting    Gait abnormality 10/11/2016   GERD (gastroesophageal reflux disease)    Gout    Headache    Hypercholesteremia    Hypercholesterolemia    Hypertension    Peripheral neuropathy 04/02/2019   Pulmonary embolism ([facility])    Shortness of breath    Sleep apnea    does wear cpap   Wears dentures     Current Outpatient Medications:    allopurinol (ZYLOPRIM) 100 MG tablet, Take 100 mg by mouth at bedtime., Disp: , Rfl:    amLODipine (NORVASC) 10 MG tablet, Take 10 mg by mouth at bedtime., Disp: , Rfl:    amLODipine (NORVASC) 5 MG tablet, Take 1 tablet (5 mg total) by mouth daily., Disp: 30 tablet, Rfl: 1   amoxicillin-clavulanate (AUGMENTIN) 875-125 MG tablet, Take 1 tablet by mouth every 12 (twelve) hours., Disp: , Rfl:    apixaban (ELIQUIS) 5 MG TABS tablet, Take 1 tablet (5 mg total) by mouth 2 (two) times daily. (Patient not taking: No sig reported), Disp: 60 tablet, Rfl: 1   ferric citrate (AURYXIA) 1 GM 210 MG(Fe) tablet, Take 420 mg by mouth 3 (three) times daily with meals., Disp: , Rfl:    furosemide (LASIX) 20 MG tablet, Take 20 mg by mouth at bedtime., Disp: , Rfl:    hydrALAZINE (APRESOLINE) 25 MG tablet, Take 1 tablet (25 mg total) by mouth 3 (three) times daily. (Patient not taking: No sig reported), Disp:  90 tablet, Rfl: 1   HYDROcodone-acetaminophen (NORCO) 5-325 MG tablet, Take 1 tablet by mouth every 6 (six) hours as needed for moderate pain., Disp: 30 tablet, Rfl: 0   nebivolol (BYSTOLIC) 10 MG tablet, Take 10 mg by mouth at bedtime. , Disp: , Rfl:    oxyCODONE-acetaminophen (PERCOCET) 5-325 MG tablet, Take 1-2 tablets by mouth every 4 (four) hours as needed for severe pain., Disp: 30 tablet, Rfl: 0   oxyCODONE-acetaminophen (PERCOCET) 5-325 MG tablet, Take 1 tablet by mouth every 4 (four) hours as needed for severe pain., Disp: 30 tablet, Rfl: 0   tamsulosin (FLOMAX) 0.4 MG CAPS capsule, Take 0.4 mg by mouth at bedtime. , Disp: , Rfl:   Social History   Tobacco Use  Smoking Status Some Days   Packs/day: 0.25   Types: Cigarettes  Smokeless Tobacco Never    Allergies  Allergen Reactions   Darvon [Propoxyphene] Itching   Baclofen Other (See Comments)    confusion   Gabapentin     Felt like I was out of my head, slept all day, confusion    Sulfa Antibiotics Itching   Objective:  There were no vitals filed for this visit. There is no height or weight on file to calculate BMI. Constitutional Well developed.  Well nourished.  Vascular Foot warm and well perfused. Capillary refill normal to all digits.   Neurologic Normal speech. Oriented to person, place, and time. Epicritic sensation to light touch grossly present bilaterally.  Dermatologic Skin completely reepithelialized.  No margin dehiscence noted.  No clinical signs of infection noted.  Orthopedic: No further tenderness to palpation noted about the surgical site.   Radiographs: 3 views of skeletally mature adult left foot: Sharp surgical margins noted.  Status post amputation of the left second digit. Assessment:   No diagnosis found.  Plan:  Patient was evaluated and treated and all questions answered.  S/p foot surgery left -Clinically healed and at this time I discussed with the patient importance of shoe gear  modification.  He can return to regular shoes with regular activities.  If there is any signs of recurrence have asked him to come see me right away.  Patient states understanding.  No follow-ups on file.

## 2021-03-24 ENCOUNTER — Other Ambulatory Visit: Payer: Self-pay

## 2021-03-24 ENCOUNTER — Ambulatory Visit (INDEPENDENT_AMBULATORY_CARE_PROVIDER_SITE_OTHER): Payer: Medicare HMO | Admitting: Podiatry

## 2021-03-24 DIAGNOSIS — E1122 Type 2 diabetes mellitus with diabetic chronic kidney disease: Secondary | ICD-10-CM

## 2021-03-24 DIAGNOSIS — Q828 Other specified congenital malformations of skin: Secondary | ICD-10-CM

## 2021-03-26 NOTE — Progress Notes (Signed)
?Subjective:  ?Patient ID: Duane Ortiz, male    DOB: 13-Jul-1950,  MRN: [id number] ? ?Chief Complaint  ?Patient presents with  ? Callouses  ? ? ?71 y.o. male presents with the above complaint.  Patient presents with right midfoot Porro and left fourth digit poor.  Patient states painful to touch painful to walk on.  He would like to have it debrided out is not able to do it himself.  He denies any other acute complaints.  His amputation sites are doing well. ? ? ?Review of Systems: Negative except as noted in the HPI. Denies N/V/F/Ch. ? ?Past Medical History:  ?Diagnosis Date  ? Arthritis   ? Chronic kidney disease   ? Chronic kidney disease   ? Diabetes ([place]) 02/012017  ? Fainting   ? Gait abnormality 10/11/2016  ? GERD (gastroesophageal reflux disease)   ? Gout   ? Headache   ? Hypercholesteremia   ? Hypercholesterolemia   ? Hypertension   ? Peripheral neuropathy 04/02/2019  ? Pulmonary embolism ([place])   ? Shortness of breath   ? Sleep apnea   ? does wear cpap  ? Wears dentures   ? ? ?Current Outpatient Medications:  ?  allopurinol (ZYLOPRIM) 100 MG tablet, Take 100 mg by mouth at bedtime., Disp: , Rfl:  ?  amLODipine (NORVASC) 10 MG tablet, Take 10 mg by mouth at bedtime., Disp: , Rfl:  ?  amLODipine (NORVASC) 5 MG tablet, Take 1 tablet (5 mg total) by mouth daily., Disp: 30 tablet, Rfl: 1 ?  amoxicillin-clavulanate (AUGMENTIN) 875-125 MG tablet, Take 1 tablet by mouth every 12 (twelve) hours., Disp: , Rfl:  ?  apixaban (ELIQUIS) 5 MG TABS tablet, Take 1 tablet (5 mg total) by mouth 2 (two) times daily. (Patient not taking: No sig reported), Disp: 60 tablet, Rfl: 1 ?  ferric citrate (AURYXIA) 1 GM 210 MG(Fe) tablet, Take 420 mg by mouth 3 (three) times daily with meals., Disp: , Rfl:  ?  furosemide (LASIX) 20 MG tablet, Take 20 mg by mouth at bedtime., Disp: , Rfl:  ?  hydrALAZINE (APRESOLINE) 25 MG tablet, Take 1 tablet (25 mg total) by mouth 3 (three) times daily. (Patient not taking: No sig reported), Disp:  90 tablet, Rfl: 1 ?  HYDROcodone-acetaminophen (NORCO) 5-325 MG tablet, Take 1 tablet by mouth every 6 (six) hours as needed for moderate pain., Disp: 30 tablet, Rfl: 0 ?  nebivolol (BYSTOLIC) 10 MG tablet, Take 10 mg by mouth at bedtime. , Disp: , Rfl:  ?  oxyCODONE-acetaminophen (PERCOCET) 5-325 MG tablet, Take 1-2 tablets by mouth every 4 (four) hours as needed for severe pain., Disp: 30 tablet, Rfl: 0 ?  oxyCODONE-acetaminophen (PERCOCET) 5-325 MG tablet, Take 1 tablet by mouth every 4 (four) hours as needed for severe pain., Disp: 30 tablet, Rfl: 0 ?  tamsulosin (FLOMAX) 0.4 MG CAPS capsule, Take 0.4 mg by mouth at bedtime. , Disp: , Rfl:  ? ?Social History  ? ?Tobacco Use  ?Smoking Status Some Days  ? Packs/day: 0.25  ? Types: Cigarettes  ?Smokeless Tobacco Never  ? ? ?Allergies  ?Allergen Reactions  ? Darvon [Propoxyphene] Itching  ? Baclofen Other (See Comments)  ?  confusion  ? Gabapentin   ?  Felt like I was out of my head, slept all day, confusion   ? Sulfa Antibiotics Itching  ? ?Objective:  ?There were no vitals filed for this visit. ?There is no height or weight on file to calculate BMI. ?Constitutional Well  developed. ?Well nourished.  ?Vascular Dorsalis pedis pulses palpable bilaterally. ?Posterior tibial pulses palpable bilaterally. ?Capillary refill normal to all digits.  ?No cyanosis or clubbing noted. ?Pedal hair growth normal.  ?Neurologic Normal speech. ?Oriented to person, place, and time. ?Epicritic sensation to light touch grossly present bilaterally.  ?Dermatologic Hyperkeratotic lesion with central nucleated core noted to right midfoot.  Mild pain on palpation.  Left fourth digit porokeratotic lesion noted as well.  Mild pain on palpation  ?Orthopedic: Normal joint ROM without pain or crepitus bilaterally. ?No visible deformities. ?No bony tenderness.  ? ?Radiographs: None ?Assessment:  ? ?1. Type 2 diabetes mellitus with chronic kidney disease on chronic dialysis, without long-term  current use of insulin ([place])   ?2. Porokeratosis   ? ?Plan:  ?Patient was evaluated and treated and all questions answered. ? ?Right midfoot and left fourth digit porokeratosis x2 ?-All questions and concerns were discussed with the patient in extensive detail.  Given the amount of pain that he is having I believe would benefit from debridement of the lesion.  Using chisel blade handle the lesion was debrided down to healthy striated tissue followed by excision of central nucleated core. ?-I discussed shoe gear modification and offloading in extensive detail ? ?No follow-ups on file. ?

## 2021-06-17 ENCOUNTER — Ambulatory Visit (INDEPENDENT_AMBULATORY_CARE_PROVIDER_SITE_OTHER): Payer: Medicare HMO | Admitting: Podiatry

## 2021-06-17 ENCOUNTER — Ambulatory Visit: Payer: Medicare HMO | Admitting: Podiatry

## 2021-06-17 ENCOUNTER — Encounter: Payer: Self-pay | Admitting: Podiatry

## 2021-06-17 DIAGNOSIS — B351 Tinea unguium: Secondary | ICD-10-CM | POA: Diagnosis not present

## 2021-06-17 DIAGNOSIS — Z992 Dependence on renal dialysis: Secondary | ICD-10-CM

## 2021-06-17 DIAGNOSIS — M79675 Pain in left toe(s): Secondary | ICD-10-CM | POA: Diagnosis not present

## 2021-06-17 DIAGNOSIS — M79674 Pain in right toe(s): Secondary | ICD-10-CM

## 2021-06-17 DIAGNOSIS — E1122 Type 2 diabetes mellitus with diabetic chronic kidney disease: Secondary | ICD-10-CM

## 2021-06-17 DIAGNOSIS — N186 End stage renal disease: Secondary | ICD-10-CM | POA: Diagnosis not present

## 2021-06-18 NOTE — Progress Notes (Signed)
  Subjective:  Patient ID: Duane Ortiz, male    DOB: [birth year],  MRN: [id number]  Chief Complaint  Patient presents with   Callouses   71 y.o. male returns for the above complaint.  Patient presents with thickened elongated dystrophic toenails x8.  Mild pain on palpation she is not able to debride down himself.  He would like for me to do it.  He denies any other acute complaints.  Hurts with ambulation.  Objective:  There were no vitals filed for this visit. Podiatric Exam: Vascular: dorsalis pedis and posterior tibial pulses are palpable bilateral. Capillary return is immediate. Temperature gradient is WNL. Skin turgor WNL  Sensorium: Normal Semmes Weinstein monofilament test. Normal tactile sensation bilaterally. Nail Exam: Pt has thick disfigured discolored nails with subungual debris noted bilateral entire nail x8.  History of left hallux and second digit amputation.  Pain on palpation to the nails. Ulcer Exam: There is no evidence of ulcer or pre-ulcerative changes or infection. Orthopedic Exam: Muscle tone and strength are WNL. No limitations in general ROM. No crepitus or effusions noted.  Skin: No Porokeratosis. No infection or ulcers    Assessment & Plan:   1. Pain due to onychomycosis of toenails of both feet   2. Type 2 diabetes mellitus with chronic kidney disease on chronic dialysis, without long-term current use of insulin ([place])     Patient was evaluated and treated and all questions answered.  Onychomycosis with pain  -Nails palliatively debrided as below. -Educated on self-care  Procedure: Nail Debridement Rationale: pain  Type of Debridement: manual, sharp debridement. Instrumentation: Nail nipper, rotary burr. Number of Nails: 8  Procedures and Treatment: Consent by patient was obtained for treatment procedures. The patient understood the discussion of treatment and procedures well. All questions were answered thoroughly reviewed. Debridement of mycotic  and hypertrophic toenails, 1 through 5 bilateral and clearing of subungual debris. No ulceration, no infection noted.  Return Visit-Office Procedure: Patient instructed to return to the office for a follow up visit 3 months for continued evaluation and treatment.  Boneta Lucks, DPM    Return in about 3 months (around 09/17/2021).

## 2021-09-20 ENCOUNTER — Encounter: Payer: Self-pay | Admitting: Podiatry

## 2021-09-20 ENCOUNTER — Ambulatory Visit (INDEPENDENT_AMBULATORY_CARE_PROVIDER_SITE_OTHER): Payer: Medicare HMO | Admitting: Podiatry

## 2021-09-20 DIAGNOSIS — Q828 Other specified congenital malformations of skin: Secondary | ICD-10-CM

## 2021-09-20 DIAGNOSIS — Z89422 Acquired absence of other left toe(s): Secondary | ICD-10-CM

## 2021-09-20 DIAGNOSIS — M79675 Pain in left toe(s): Secondary | ICD-10-CM

## 2021-09-20 DIAGNOSIS — B351 Tinea unguium: Secondary | ICD-10-CM

## 2021-09-20 DIAGNOSIS — N186 End stage renal disease: Secondary | ICD-10-CM

## 2021-09-20 DIAGNOSIS — Z992 Dependence on renal dialysis: Secondary | ICD-10-CM | POA: Diagnosis not present

## 2021-09-20 DIAGNOSIS — I999 Unspecified disorder of circulatory system: Secondary | ICD-10-CM

## 2021-09-20 DIAGNOSIS — M79674 Pain in right toe(s): Secondary | ICD-10-CM

## 2021-09-20 DIAGNOSIS — E1122 Type 2 diabetes mellitus with diabetic chronic kidney disease: Secondary | ICD-10-CM | POA: Diagnosis not present

## 2021-09-20 NOTE — Progress Notes (Signed)
This patient returns to my office for at risk foot care.  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  Patient is taking eliquis.   He has amputation 1,2 digits left foot.    This patient is unable to cut nails himself since the patient cannot reach his nails.These nails are painful walking and wearing shoes.  Patient has painful callus right foot.  This patient presents for at risk foot care today.  General Appearance  Alert, conversant and in no acute stress.  Vascular  Dorsalis pedis and posterior tibial  pulses are  weakly palpable  bilaterally.  Capillary return is within normal limits  bilaterally. Temperature is within normal limits  bilaterally.  Neurologic  Senn-Weinstein monofilament wire test diminished  bilaterally. Muscle power within normal limits bilaterally.  Nails Thick disfigured discolored nails with subungual debris  from hallux to fifth toes right foot and 3-5  left foot.. No evidence of bacterial infection or drainage bilaterally.  Orthopedic  No limitations of motion  feet .  No crepitus or effusions noted.  No bony pathology or digital deformities noted.  Amputation 1,2 left foot.    Skin  normotropic skin noted bilaterally.  No signs of infections or ulcers noted.  Porokeratosis midarch right foot.   Onychomycosis  Pain in right toes  Pain in left toes  Porokeratosis  right foot.  Consent was obtained for treatment procedures.   Mechanical debridement of nails 1-5  bilaterally performed with a nail nipper.  Filed with dremel without incident.    Return office visit   10 weeks                  Told patient to return for periodic foot care and evaluation due to potential at risk complications.   Gardiner Barefoot DPM

## 2021-11-29 ENCOUNTER — Ambulatory Visit: Payer: Medicare HMO | Admitting: Podiatry

## 2022-04-05 DIAGNOSIS — Z992 Dependence on renal dialysis: Secondary | ICD-10-CM | POA: Diagnosis not present

## 2022-04-05 DIAGNOSIS — N2581 Secondary hyperparathyroidism of renal origin: Secondary | ICD-10-CM | POA: Diagnosis not present

## 2022-04-05 DIAGNOSIS — N186 End stage renal disease: Secondary | ICD-10-CM | POA: Diagnosis not present

## 2022-04-07 DIAGNOSIS — N186 End stage renal disease: Secondary | ICD-10-CM | POA: Diagnosis not present

## 2022-04-07 DIAGNOSIS — N2581 Secondary hyperparathyroidism of renal origin: Secondary | ICD-10-CM | POA: Diagnosis not present

## 2022-04-07 DIAGNOSIS — Z992 Dependence on renal dialysis: Secondary | ICD-10-CM | POA: Diagnosis not present

## 2022-04-09 DIAGNOSIS — N2581 Secondary hyperparathyroidism of renal origin: Secondary | ICD-10-CM | POA: Diagnosis not present

## 2022-04-09 DIAGNOSIS — Z992 Dependence on renal dialysis: Secondary | ICD-10-CM | POA: Diagnosis not present

## 2022-04-09 DIAGNOSIS — N186 End stage renal disease: Secondary | ICD-10-CM | POA: Diagnosis not present

## 2022-04-12 DIAGNOSIS — N2581 Secondary hyperparathyroidism of renal origin: Secondary | ICD-10-CM | POA: Diagnosis not present

## 2022-04-12 DIAGNOSIS — N186 End stage renal disease: Secondary | ICD-10-CM | POA: Diagnosis not present

## 2022-04-12 DIAGNOSIS — Z992 Dependence on renal dialysis: Secondary | ICD-10-CM | POA: Diagnosis not present

## 2022-04-14 DIAGNOSIS — Z992 Dependence on renal dialysis: Secondary | ICD-10-CM | POA: Diagnosis not present

## 2022-04-14 DIAGNOSIS — N186 End stage renal disease: Secondary | ICD-10-CM | POA: Diagnosis not present

## 2022-04-14 DIAGNOSIS — N2581 Secondary hyperparathyroidism of renal origin: Secondary | ICD-10-CM | POA: Diagnosis not present

## 2022-04-16 DIAGNOSIS — N2581 Secondary hyperparathyroidism of renal origin: Secondary | ICD-10-CM | POA: Diagnosis not present

## 2022-04-16 DIAGNOSIS — N186 End stage renal disease: Secondary | ICD-10-CM | POA: Diagnosis not present

## 2022-04-16 DIAGNOSIS — Z992 Dependence on renal dialysis: Secondary | ICD-10-CM | POA: Diagnosis not present

## 2022-04-19 DIAGNOSIS — Z992 Dependence on renal dialysis: Secondary | ICD-10-CM | POA: Diagnosis not present

## 2022-04-19 DIAGNOSIS — N2581 Secondary hyperparathyroidism of renal origin: Secondary | ICD-10-CM | POA: Diagnosis not present

## 2022-04-19 DIAGNOSIS — N186 End stage renal disease: Secondary | ICD-10-CM | POA: Diagnosis not present

## 2022-04-21 DIAGNOSIS — N186 End stage renal disease: Secondary | ICD-10-CM | POA: Diagnosis not present

## 2022-04-21 DIAGNOSIS — Z992 Dependence on renal dialysis: Secondary | ICD-10-CM | POA: Diagnosis not present

## 2022-04-21 DIAGNOSIS — N2581 Secondary hyperparathyroidism of renal origin: Secondary | ICD-10-CM | POA: Diagnosis not present

## 2022-04-23 DIAGNOSIS — Z992 Dependence on renal dialysis: Secondary | ICD-10-CM | POA: Diagnosis not present

## 2022-04-23 DIAGNOSIS — N186 End stage renal disease: Secondary | ICD-10-CM | POA: Diagnosis not present

## 2022-04-23 DIAGNOSIS — N2581 Secondary hyperparathyroidism of renal origin: Secondary | ICD-10-CM | POA: Diagnosis not present

## 2022-04-25 DIAGNOSIS — H25013 Cortical age-related cataract, bilateral: Secondary | ICD-10-CM | POA: Diagnosis not present

## 2022-04-25 DIAGNOSIS — H5203 Hypermetropia, bilateral: Secondary | ICD-10-CM | POA: Diagnosis not present

## 2022-04-25 DIAGNOSIS — H52203 Unspecified astigmatism, bilateral: Secondary | ICD-10-CM | POA: Diagnosis not present

## 2022-04-26 DIAGNOSIS — N186 End stage renal disease: Secondary | ICD-10-CM | POA: Diagnosis not present

## 2022-04-26 DIAGNOSIS — N2581 Secondary hyperparathyroidism of renal origin: Secondary | ICD-10-CM | POA: Diagnosis not present

## 2022-04-26 DIAGNOSIS — Z992 Dependence on renal dialysis: Secondary | ICD-10-CM | POA: Diagnosis not present

## 2022-04-28 DIAGNOSIS — N186 End stage renal disease: Secondary | ICD-10-CM | POA: Diagnosis not present

## 2022-04-28 DIAGNOSIS — N2581 Secondary hyperparathyroidism of renal origin: Secondary | ICD-10-CM | POA: Diagnosis not present

## 2022-04-28 DIAGNOSIS — Z992 Dependence on renal dialysis: Secondary | ICD-10-CM | POA: Diagnosis not present

## 2022-04-30 DIAGNOSIS — N2581 Secondary hyperparathyroidism of renal origin: Secondary | ICD-10-CM | POA: Diagnosis not present

## 2022-04-30 DIAGNOSIS — Z992 Dependence on renal dialysis: Secondary | ICD-10-CM | POA: Diagnosis not present

## 2022-04-30 DIAGNOSIS — N186 End stage renal disease: Secondary | ICD-10-CM | POA: Diagnosis not present

## 2022-05-02 DIAGNOSIS — M79674 Pain in right toe(s): Secondary | ICD-10-CM | POA: Diagnosis not present

## 2022-05-02 DIAGNOSIS — Z89412 Acquired absence of left great toe: Secondary | ICD-10-CM | POA: Diagnosis not present

## 2022-05-02 DIAGNOSIS — M79675 Pain in left toe(s): Secondary | ICD-10-CM | POA: Diagnosis not present

## 2022-05-02 DIAGNOSIS — Z89422 Acquired absence of other left toe(s): Secondary | ICD-10-CM | POA: Diagnosis not present

## 2022-05-02 DIAGNOSIS — B351 Tinea unguium: Secondary | ICD-10-CM | POA: Diagnosis not present

## 2022-05-02 DIAGNOSIS — M2042 Other hammer toe(s) (acquired), left foot: Secondary | ICD-10-CM | POA: Diagnosis not present

## 2022-05-02 DIAGNOSIS — M2041 Other hammer toe(s) (acquired), right foot: Secondary | ICD-10-CM | POA: Diagnosis not present

## 2022-05-03 DIAGNOSIS — N186 End stage renal disease: Secondary | ICD-10-CM | POA: Diagnosis not present

## 2022-05-03 DIAGNOSIS — M2042 Other hammer toe(s) (acquired), left foot: Secondary | ICD-10-CM | POA: Diagnosis not present

## 2022-05-03 DIAGNOSIS — I129 Hypertensive chronic kidney disease with stage 1 through stage 4 chronic kidney disease, or unspecified chronic kidney disease: Secondary | ICD-10-CM | POA: Diagnosis not present

## 2022-05-03 DIAGNOSIS — Z992 Dependence on renal dialysis: Secondary | ICD-10-CM | POA: Diagnosis not present

## 2022-05-03 DIAGNOSIS — B351 Tinea unguium: Secondary | ICD-10-CM | POA: Diagnosis not present

## 2022-05-03 DIAGNOSIS — N2581 Secondary hyperparathyroidism of renal origin: Secondary | ICD-10-CM | POA: Diagnosis not present

## 2022-05-03 DIAGNOSIS — M79675 Pain in left toe(s): Secondary | ICD-10-CM | POA: Diagnosis not present

## 2022-05-03 DIAGNOSIS — M2041 Other hammer toe(s) (acquired), right foot: Secondary | ICD-10-CM | POA: Diagnosis not present

## 2022-05-03 DIAGNOSIS — M79674 Pain in right toe(s): Secondary | ICD-10-CM | POA: Diagnosis not present

## 2022-05-05 DIAGNOSIS — N2581 Secondary hyperparathyroidism of renal origin: Secondary | ICD-10-CM | POA: Diagnosis not present

## 2022-05-05 DIAGNOSIS — Z992 Dependence on renal dialysis: Secondary | ICD-10-CM | POA: Diagnosis not present

## 2022-05-05 DIAGNOSIS — N186 End stage renal disease: Secondary | ICD-10-CM | POA: Diagnosis not present

## 2022-05-06 DIAGNOSIS — H2513 Age-related nuclear cataract, bilateral: Secondary | ICD-10-CM | POA: Diagnosis not present

## 2022-05-06 DIAGNOSIS — H40053 Ocular hypertension, bilateral: Secondary | ICD-10-CM | POA: Diagnosis not present

## 2022-05-06 DIAGNOSIS — H25013 Cortical age-related cataract, bilateral: Secondary | ICD-10-CM | POA: Diagnosis not present

## 2022-05-07 DIAGNOSIS — N2581 Secondary hyperparathyroidism of renal origin: Secondary | ICD-10-CM | POA: Diagnosis not present

## 2022-05-07 DIAGNOSIS — Z992 Dependence on renal dialysis: Secondary | ICD-10-CM | POA: Diagnosis not present

## 2022-05-07 DIAGNOSIS — N186 End stage renal disease: Secondary | ICD-10-CM | POA: Diagnosis not present

## 2022-05-10 DIAGNOSIS — N2581 Secondary hyperparathyroidism of renal origin: Secondary | ICD-10-CM | POA: Diagnosis not present

## 2022-05-10 DIAGNOSIS — N186 End stage renal disease: Secondary | ICD-10-CM | POA: Diagnosis not present

## 2022-05-10 DIAGNOSIS — Z992 Dependence on renal dialysis: Secondary | ICD-10-CM | POA: Diagnosis not present

## 2022-05-12 DIAGNOSIS — Z992 Dependence on renal dialysis: Secondary | ICD-10-CM | POA: Diagnosis not present

## 2022-05-12 DIAGNOSIS — N2581 Secondary hyperparathyroidism of renal origin: Secondary | ICD-10-CM | POA: Diagnosis not present

## 2022-05-12 DIAGNOSIS — N186 End stage renal disease: Secondary | ICD-10-CM | POA: Diagnosis not present

## 2022-05-13 DIAGNOSIS — I10 Essential (primary) hypertension: Secondary | ICD-10-CM | POA: Diagnosis not present

## 2022-05-13 DIAGNOSIS — Z125 Encounter for screening for malignant neoplasm of prostate: Secondary | ICD-10-CM | POA: Diagnosis not present

## 2022-05-13 DIAGNOSIS — D649 Anemia, unspecified: Secondary | ICD-10-CM | POA: Diagnosis not present

## 2022-05-13 DIAGNOSIS — E559 Vitamin D deficiency, unspecified: Secondary | ICD-10-CM | POA: Diagnosis not present

## 2022-05-13 DIAGNOSIS — E1129 Type 2 diabetes mellitus with other diabetic kidney complication: Secondary | ICD-10-CM | POA: Diagnosis not present

## 2022-05-13 DIAGNOSIS — K219 Gastro-esophageal reflux disease without esophagitis: Secondary | ICD-10-CM | POA: Diagnosis not present

## 2022-05-14 DIAGNOSIS — N186 End stage renal disease: Secondary | ICD-10-CM | POA: Diagnosis not present

## 2022-05-14 DIAGNOSIS — Z992 Dependence on renal dialysis: Secondary | ICD-10-CM | POA: Diagnosis not present

## 2022-05-14 DIAGNOSIS — N2581 Secondary hyperparathyroidism of renal origin: Secondary | ICD-10-CM | POA: Diagnosis not present

## 2022-05-17 DIAGNOSIS — N2581 Secondary hyperparathyroidism of renal origin: Secondary | ICD-10-CM | POA: Diagnosis not present

## 2022-05-17 DIAGNOSIS — Z992 Dependence on renal dialysis: Secondary | ICD-10-CM | POA: Diagnosis not present

## 2022-05-17 DIAGNOSIS — N186 End stage renal disease: Secondary | ICD-10-CM | POA: Diagnosis not present

## 2022-05-18 DIAGNOSIS — E1129 Type 2 diabetes mellitus with other diabetic kidney complication: Secondary | ICD-10-CM | POA: Diagnosis not present

## 2022-05-18 DIAGNOSIS — E1122 Type 2 diabetes mellitus with diabetic chronic kidney disease: Secondary | ICD-10-CM | POA: Diagnosis not present

## 2022-05-18 DIAGNOSIS — I120 Hypertensive chronic kidney disease with stage 5 chronic kidney disease or end stage renal disease: Secondary | ICD-10-CM | POA: Diagnosis not present

## 2022-05-18 DIAGNOSIS — Z992 Dependence on renal dialysis: Secondary | ICD-10-CM | POA: Diagnosis not present

## 2022-05-18 DIAGNOSIS — Z0000 Encounter for general adult medical examination without abnormal findings: Secondary | ICD-10-CM | POA: Diagnosis not present

## 2022-05-18 DIAGNOSIS — E1142 Type 2 diabetes mellitus with diabetic polyneuropathy: Secondary | ICD-10-CM | POA: Diagnosis not present

## 2022-05-18 DIAGNOSIS — R82998 Other abnormal findings in urine: Secondary | ICD-10-CM | POA: Diagnosis not present

## 2022-05-18 DIAGNOSIS — Z720 Tobacco use: Secondary | ICD-10-CM | POA: Diagnosis not present

## 2022-05-18 DIAGNOSIS — Z1339 Encounter for screening examination for other mental health and behavioral disorders: Secondary | ICD-10-CM | POA: Diagnosis not present

## 2022-05-18 DIAGNOSIS — Z23 Encounter for immunization: Secondary | ICD-10-CM | POA: Diagnosis not present

## 2022-05-18 DIAGNOSIS — Z1331 Encounter for screening for depression: Secondary | ICD-10-CM | POA: Diagnosis not present

## 2022-05-18 DIAGNOSIS — N186 End stage renal disease: Secondary | ICD-10-CM | POA: Diagnosis not present

## 2022-05-18 DIAGNOSIS — S98139A Complete traumatic amputation of one unspecified lesser toe, initial encounter: Secondary | ICD-10-CM | POA: Diagnosis not present

## 2022-05-21 DIAGNOSIS — N186 End stage renal disease: Secondary | ICD-10-CM | POA: Diagnosis not present

## 2022-05-21 DIAGNOSIS — Z992 Dependence on renal dialysis: Secondary | ICD-10-CM | POA: Diagnosis not present

## 2022-05-24 DIAGNOSIS — N2581 Secondary hyperparathyroidism of renal origin: Secondary | ICD-10-CM | POA: Diagnosis not present

## 2022-05-24 DIAGNOSIS — N186 End stage renal disease: Secondary | ICD-10-CM | POA: Diagnosis not present

## 2022-05-24 DIAGNOSIS — Z992 Dependence on renal dialysis: Secondary | ICD-10-CM | POA: Diagnosis not present

## 2022-05-26 DIAGNOSIS — Z992 Dependence on renal dialysis: Secondary | ICD-10-CM | POA: Diagnosis not present

## 2022-05-26 DIAGNOSIS — N186 End stage renal disease: Secondary | ICD-10-CM | POA: Diagnosis not present

## 2022-05-26 DIAGNOSIS — N2581 Secondary hyperparathyroidism of renal origin: Secondary | ICD-10-CM | POA: Diagnosis not present

## 2022-05-28 DIAGNOSIS — N2581 Secondary hyperparathyroidism of renal origin: Secondary | ICD-10-CM | POA: Diagnosis not present

## 2022-05-28 DIAGNOSIS — N186 End stage renal disease: Secondary | ICD-10-CM | POA: Diagnosis not present

## 2022-05-28 DIAGNOSIS — Z992 Dependence on renal dialysis: Secondary | ICD-10-CM | POA: Diagnosis not present

## 2022-05-31 DIAGNOSIS — N2581 Secondary hyperparathyroidism of renal origin: Secondary | ICD-10-CM | POA: Diagnosis not present

## 2022-05-31 DIAGNOSIS — N186 End stage renal disease: Secondary | ICD-10-CM | POA: Diagnosis not present

## 2022-05-31 DIAGNOSIS — Z992 Dependence on renal dialysis: Secondary | ICD-10-CM | POA: Diagnosis not present

## 2022-06-02 DIAGNOSIS — Z992 Dependence on renal dialysis: Secondary | ICD-10-CM | POA: Diagnosis not present

## 2022-06-02 DIAGNOSIS — N186 End stage renal disease: Secondary | ICD-10-CM | POA: Diagnosis not present

## 2022-06-02 DIAGNOSIS — N2581 Secondary hyperparathyroidism of renal origin: Secondary | ICD-10-CM | POA: Diagnosis not present

## 2022-06-03 DIAGNOSIS — N186 End stage renal disease: Secondary | ICD-10-CM | POA: Diagnosis not present

## 2022-06-03 DIAGNOSIS — I129 Hypertensive chronic kidney disease with stage 1 through stage 4 chronic kidney disease, or unspecified chronic kidney disease: Secondary | ICD-10-CM | POA: Diagnosis not present

## 2022-06-03 DIAGNOSIS — Z992 Dependence on renal dialysis: Secondary | ICD-10-CM | POA: Diagnosis not present

## 2022-06-07 DIAGNOSIS — Z961 Presence of intraocular lens: Secondary | ICD-10-CM | POA: Diagnosis not present

## 2022-06-07 DIAGNOSIS — H25011 Cortical age-related cataract, right eye: Secondary | ICD-10-CM | POA: Diagnosis not present

## 2022-06-07 DIAGNOSIS — H25811 Combined forms of age-related cataract, right eye: Secondary | ICD-10-CM | POA: Diagnosis not present

## 2022-06-07 DIAGNOSIS — H2511 Age-related nuclear cataract, right eye: Secondary | ICD-10-CM | POA: Diagnosis not present

## 2022-06-07 DIAGNOSIS — H21561 Pupillary abnormality, right eye: Secondary | ICD-10-CM | POA: Diagnosis not present

## 2022-06-09 DIAGNOSIS — N186 End stage renal disease: Secondary | ICD-10-CM | POA: Diagnosis not present

## 2022-06-09 DIAGNOSIS — N2581 Secondary hyperparathyroidism of renal origin: Secondary | ICD-10-CM | POA: Diagnosis not present

## 2022-06-09 DIAGNOSIS — Z992 Dependence on renal dialysis: Secondary | ICD-10-CM | POA: Diagnosis not present

## 2022-06-11 DIAGNOSIS — N2581 Secondary hyperparathyroidism of renal origin: Secondary | ICD-10-CM | POA: Diagnosis not present

## 2022-06-11 DIAGNOSIS — N186 End stage renal disease: Secondary | ICD-10-CM | POA: Diagnosis not present

## 2022-06-11 DIAGNOSIS — Z992 Dependence on renal dialysis: Secondary | ICD-10-CM | POA: Diagnosis not present

## 2022-06-14 DIAGNOSIS — N186 End stage renal disease: Secondary | ICD-10-CM | POA: Diagnosis not present

## 2022-06-14 DIAGNOSIS — Z992 Dependence on renal dialysis: Secondary | ICD-10-CM | POA: Diagnosis not present

## 2022-06-14 DIAGNOSIS — N2581 Secondary hyperparathyroidism of renal origin: Secondary | ICD-10-CM | POA: Diagnosis not present

## 2022-06-16 DIAGNOSIS — Z992 Dependence on renal dialysis: Secondary | ICD-10-CM | POA: Diagnosis not present

## 2022-06-16 DIAGNOSIS — N186 End stage renal disease: Secondary | ICD-10-CM | POA: Diagnosis not present

## 2022-06-16 DIAGNOSIS — N2581 Secondary hyperparathyroidism of renal origin: Secondary | ICD-10-CM | POA: Diagnosis not present

## 2022-06-18 DIAGNOSIS — N186 End stage renal disease: Secondary | ICD-10-CM | POA: Diagnosis not present

## 2022-06-18 DIAGNOSIS — Z992 Dependence on renal dialysis: Secondary | ICD-10-CM | POA: Diagnosis not present

## 2022-06-18 DIAGNOSIS — N2581 Secondary hyperparathyroidism of renal origin: Secondary | ICD-10-CM | POA: Diagnosis not present

## 2022-06-20 DIAGNOSIS — Z01818 Encounter for other preprocedural examination: Secondary | ICD-10-CM | POA: Diagnosis not present

## 2022-06-20 DIAGNOSIS — N186 End stage renal disease: Secondary | ICD-10-CM | POA: Diagnosis not present

## 2022-06-21 DIAGNOSIS — N186 End stage renal disease: Secondary | ICD-10-CM | POA: Diagnosis not present

## 2022-06-21 DIAGNOSIS — Z992 Dependence on renal dialysis: Secondary | ICD-10-CM | POA: Diagnosis not present

## 2022-06-21 DIAGNOSIS — N2581 Secondary hyperparathyroidism of renal origin: Secondary | ICD-10-CM | POA: Diagnosis not present

## 2022-06-23 DIAGNOSIS — N2581 Secondary hyperparathyroidism of renal origin: Secondary | ICD-10-CM | POA: Diagnosis not present

## 2022-06-23 DIAGNOSIS — Z992 Dependence on renal dialysis: Secondary | ICD-10-CM | POA: Diagnosis not present

## 2022-06-23 DIAGNOSIS — N186 End stage renal disease: Secondary | ICD-10-CM | POA: Diagnosis not present

## 2022-06-25 DIAGNOSIS — N186 End stage renal disease: Secondary | ICD-10-CM | POA: Diagnosis not present

## 2022-06-25 DIAGNOSIS — N2581 Secondary hyperparathyroidism of renal origin: Secondary | ICD-10-CM | POA: Diagnosis not present

## 2022-06-25 DIAGNOSIS — Z992 Dependence on renal dialysis: Secondary | ICD-10-CM | POA: Diagnosis not present

## 2022-06-28 DIAGNOSIS — Z992 Dependence on renal dialysis: Secondary | ICD-10-CM | POA: Diagnosis not present

## 2022-06-28 DIAGNOSIS — N2581 Secondary hyperparathyroidism of renal origin: Secondary | ICD-10-CM | POA: Diagnosis not present

## 2022-06-28 DIAGNOSIS — N186 End stage renal disease: Secondary | ICD-10-CM | POA: Diagnosis not present

## 2022-06-30 DIAGNOSIS — N186 End stage renal disease: Secondary | ICD-10-CM | POA: Diagnosis not present

## 2022-06-30 DIAGNOSIS — N2581 Secondary hyperparathyroidism of renal origin: Secondary | ICD-10-CM | POA: Diagnosis not present

## 2022-06-30 DIAGNOSIS — Z992 Dependence on renal dialysis: Secondary | ICD-10-CM | POA: Diagnosis not present

## 2022-07-02 DIAGNOSIS — Z992 Dependence on renal dialysis: Secondary | ICD-10-CM | POA: Diagnosis not present

## 2022-07-02 DIAGNOSIS — N2581 Secondary hyperparathyroidism of renal origin: Secondary | ICD-10-CM | POA: Diagnosis not present

## 2022-07-02 DIAGNOSIS — N186 End stage renal disease: Secondary | ICD-10-CM | POA: Diagnosis not present

## 2022-07-03 DIAGNOSIS — Z992 Dependence on renal dialysis: Secondary | ICD-10-CM | POA: Diagnosis not present

## 2022-07-03 DIAGNOSIS — I129 Hypertensive chronic kidney disease with stage 1 through stage 4 chronic kidney disease, or unspecified chronic kidney disease: Secondary | ICD-10-CM | POA: Diagnosis not present

## 2022-07-03 DIAGNOSIS — N186 End stage renal disease: Secondary | ICD-10-CM | POA: Diagnosis not present

## 2022-07-05 DIAGNOSIS — Z992 Dependence on renal dialysis: Secondary | ICD-10-CM | POA: Diagnosis not present

## 2022-07-05 DIAGNOSIS — N2581 Secondary hyperparathyroidism of renal origin: Secondary | ICD-10-CM | POA: Diagnosis not present

## 2022-07-05 DIAGNOSIS — N186 End stage renal disease: Secondary | ICD-10-CM | POA: Diagnosis not present

## 2022-07-07 DIAGNOSIS — Z992 Dependence on renal dialysis: Secondary | ICD-10-CM | POA: Diagnosis not present

## 2022-07-07 DIAGNOSIS — N2581 Secondary hyperparathyroidism of renal origin: Secondary | ICD-10-CM | POA: Diagnosis not present

## 2022-07-07 DIAGNOSIS — N186 End stage renal disease: Secondary | ICD-10-CM | POA: Diagnosis not present

## 2022-07-09 DIAGNOSIS — Z992 Dependence on renal dialysis: Secondary | ICD-10-CM | POA: Diagnosis not present

## 2022-07-09 DIAGNOSIS — N2581 Secondary hyperparathyroidism of renal origin: Secondary | ICD-10-CM | POA: Diagnosis not present

## 2022-07-09 DIAGNOSIS — N186 End stage renal disease: Secondary | ICD-10-CM | POA: Diagnosis not present

## 2022-07-12 DIAGNOSIS — N2581 Secondary hyperparathyroidism of renal origin: Secondary | ICD-10-CM | POA: Diagnosis not present

## 2022-07-12 DIAGNOSIS — Z992 Dependence on renal dialysis: Secondary | ICD-10-CM | POA: Diagnosis not present

## 2022-07-12 DIAGNOSIS — N186 End stage renal disease: Secondary | ICD-10-CM | POA: Diagnosis not present

## 2022-07-14 DIAGNOSIS — Z992 Dependence on renal dialysis: Secondary | ICD-10-CM | POA: Diagnosis not present

## 2022-07-14 DIAGNOSIS — N2581 Secondary hyperparathyroidism of renal origin: Secondary | ICD-10-CM | POA: Diagnosis not present

## 2022-07-14 DIAGNOSIS — N186 End stage renal disease: Secondary | ICD-10-CM | POA: Diagnosis not present

## 2022-07-16 DIAGNOSIS — N186 End stage renal disease: Secondary | ICD-10-CM | POA: Diagnosis not present

## 2022-07-16 DIAGNOSIS — N2581 Secondary hyperparathyroidism of renal origin: Secondary | ICD-10-CM | POA: Diagnosis not present

## 2022-07-16 DIAGNOSIS — Z992 Dependence on renal dialysis: Secondary | ICD-10-CM | POA: Diagnosis not present

## 2022-07-19 DIAGNOSIS — Z992 Dependence on renal dialysis: Secondary | ICD-10-CM | POA: Diagnosis not present

## 2022-07-19 DIAGNOSIS — N186 End stage renal disease: Secondary | ICD-10-CM | POA: Diagnosis not present

## 2022-07-19 DIAGNOSIS — N2581 Secondary hyperparathyroidism of renal origin: Secondary | ICD-10-CM | POA: Diagnosis not present

## 2022-07-21 DIAGNOSIS — N2581 Secondary hyperparathyroidism of renal origin: Secondary | ICD-10-CM | POA: Diagnosis not present

## 2022-07-21 DIAGNOSIS — Z992 Dependence on renal dialysis: Secondary | ICD-10-CM | POA: Diagnosis not present

## 2022-07-21 DIAGNOSIS — N186 End stage renal disease: Secondary | ICD-10-CM | POA: Diagnosis not present

## 2022-07-23 DIAGNOSIS — N186 End stage renal disease: Secondary | ICD-10-CM | POA: Diagnosis not present

## 2022-07-23 DIAGNOSIS — Z992 Dependence on renal dialysis: Secondary | ICD-10-CM | POA: Diagnosis not present

## 2022-07-23 DIAGNOSIS — N2581 Secondary hyperparathyroidism of renal origin: Secondary | ICD-10-CM | POA: Diagnosis not present

## 2022-07-26 DIAGNOSIS — Z992 Dependence on renal dialysis: Secondary | ICD-10-CM | POA: Diagnosis not present

## 2022-07-26 DIAGNOSIS — N2581 Secondary hyperparathyroidism of renal origin: Secondary | ICD-10-CM | POA: Diagnosis not present

## 2022-07-26 DIAGNOSIS — N186 End stage renal disease: Secondary | ICD-10-CM | POA: Diagnosis not present

## 2022-07-28 DIAGNOSIS — Z992 Dependence on renal dialysis: Secondary | ICD-10-CM | POA: Diagnosis not present

## 2022-07-28 DIAGNOSIS — N186 End stage renal disease: Secondary | ICD-10-CM | POA: Diagnosis not present

## 2022-07-28 DIAGNOSIS — N2581 Secondary hyperparathyroidism of renal origin: Secondary | ICD-10-CM | POA: Diagnosis not present

## 2022-07-30 DIAGNOSIS — N2581 Secondary hyperparathyroidism of renal origin: Secondary | ICD-10-CM | POA: Diagnosis not present

## 2022-07-30 DIAGNOSIS — N186 End stage renal disease: Secondary | ICD-10-CM | POA: Diagnosis not present

## 2022-07-30 DIAGNOSIS — Z992 Dependence on renal dialysis: Secondary | ICD-10-CM | POA: Diagnosis not present

## 2022-08-02 DIAGNOSIS — Z992 Dependence on renal dialysis: Secondary | ICD-10-CM | POA: Diagnosis not present

## 2022-08-02 DIAGNOSIS — N2581 Secondary hyperparathyroidism of renal origin: Secondary | ICD-10-CM | POA: Diagnosis not present

## 2022-08-02 DIAGNOSIS — N186 End stage renal disease: Secondary | ICD-10-CM | POA: Diagnosis not present

## 2022-08-03 DIAGNOSIS — I129 Hypertensive chronic kidney disease with stage 1 through stage 4 chronic kidney disease, or unspecified chronic kidney disease: Secondary | ICD-10-CM | POA: Diagnosis not present

## 2022-08-03 DIAGNOSIS — Z992 Dependence on renal dialysis: Secondary | ICD-10-CM | POA: Diagnosis not present

## 2022-08-03 DIAGNOSIS — N186 End stage renal disease: Secondary | ICD-10-CM | POA: Diagnosis not present

## 2022-08-04 DIAGNOSIS — N186 End stage renal disease: Secondary | ICD-10-CM | POA: Diagnosis not present

## 2022-08-04 DIAGNOSIS — Z992 Dependence on renal dialysis: Secondary | ICD-10-CM | POA: Diagnosis not present

## 2022-08-04 DIAGNOSIS — N2581 Secondary hyperparathyroidism of renal origin: Secondary | ICD-10-CM | POA: Diagnosis not present

## 2022-08-06 DIAGNOSIS — N186 End stage renal disease: Secondary | ICD-10-CM | POA: Diagnosis not present

## 2022-08-06 DIAGNOSIS — N2581 Secondary hyperparathyroidism of renal origin: Secondary | ICD-10-CM | POA: Diagnosis not present

## 2022-08-06 DIAGNOSIS — Z992 Dependence on renal dialysis: Secondary | ICD-10-CM | POA: Diagnosis not present

## 2022-08-09 DIAGNOSIS — N186 End stage renal disease: Secondary | ICD-10-CM | POA: Diagnosis not present

## 2022-08-09 DIAGNOSIS — N2581 Secondary hyperparathyroidism of renal origin: Secondary | ICD-10-CM | POA: Diagnosis not present

## 2022-08-09 DIAGNOSIS — Z992 Dependence on renal dialysis: Secondary | ICD-10-CM | POA: Diagnosis not present

## 2022-08-11 DIAGNOSIS — Z992 Dependence on renal dialysis: Secondary | ICD-10-CM | POA: Diagnosis not present

## 2022-08-11 DIAGNOSIS — N2581 Secondary hyperparathyroidism of renal origin: Secondary | ICD-10-CM | POA: Diagnosis not present

## 2022-08-11 DIAGNOSIS — N186 End stage renal disease: Secondary | ICD-10-CM | POA: Diagnosis not present

## 2022-08-13 DIAGNOSIS — N186 End stage renal disease: Secondary | ICD-10-CM | POA: Diagnosis not present

## 2022-08-13 DIAGNOSIS — Z992 Dependence on renal dialysis: Secondary | ICD-10-CM | POA: Diagnosis not present

## 2022-08-13 DIAGNOSIS — N2581 Secondary hyperparathyroidism of renal origin: Secondary | ICD-10-CM | POA: Diagnosis not present

## 2022-08-16 DIAGNOSIS — Z992 Dependence on renal dialysis: Secondary | ICD-10-CM | POA: Diagnosis not present

## 2022-08-16 DIAGNOSIS — N2581 Secondary hyperparathyroidism of renal origin: Secondary | ICD-10-CM | POA: Diagnosis not present

## 2022-08-16 DIAGNOSIS — N186 End stage renal disease: Secondary | ICD-10-CM | POA: Diagnosis not present

## 2022-08-18 DIAGNOSIS — Z992 Dependence on renal dialysis: Secondary | ICD-10-CM | POA: Diagnosis not present

## 2022-08-18 DIAGNOSIS — N186 End stage renal disease: Secondary | ICD-10-CM | POA: Diagnosis not present

## 2022-08-18 DIAGNOSIS — N2581 Secondary hyperparathyroidism of renal origin: Secondary | ICD-10-CM | POA: Diagnosis not present

## 2022-08-20 DIAGNOSIS — N186 End stage renal disease: Secondary | ICD-10-CM | POA: Diagnosis not present

## 2022-08-20 DIAGNOSIS — Z992 Dependence on renal dialysis: Secondary | ICD-10-CM | POA: Diagnosis not present

## 2022-08-20 DIAGNOSIS — N2581 Secondary hyperparathyroidism of renal origin: Secondary | ICD-10-CM | POA: Diagnosis not present

## 2022-08-23 DIAGNOSIS — N2581 Secondary hyperparathyroidism of renal origin: Secondary | ICD-10-CM | POA: Diagnosis not present

## 2022-08-23 DIAGNOSIS — N186 End stage renal disease: Secondary | ICD-10-CM | POA: Diagnosis not present

## 2022-08-23 DIAGNOSIS — Z992 Dependence on renal dialysis: Secondary | ICD-10-CM | POA: Diagnosis not present

## 2022-08-25 DIAGNOSIS — N2581 Secondary hyperparathyroidism of renal origin: Secondary | ICD-10-CM | POA: Diagnosis not present

## 2022-08-25 DIAGNOSIS — N186 End stage renal disease: Secondary | ICD-10-CM | POA: Diagnosis not present

## 2022-08-25 DIAGNOSIS — Z992 Dependence on renal dialysis: Secondary | ICD-10-CM | POA: Diagnosis not present

## 2022-08-27 DIAGNOSIS — Z992 Dependence on renal dialysis: Secondary | ICD-10-CM | POA: Diagnosis not present

## 2022-08-27 DIAGNOSIS — N186 End stage renal disease: Secondary | ICD-10-CM | POA: Diagnosis not present

## 2022-08-27 DIAGNOSIS — N2581 Secondary hyperparathyroidism of renal origin: Secondary | ICD-10-CM | POA: Diagnosis not present

## 2022-08-30 DIAGNOSIS — Z992 Dependence on renal dialysis: Secondary | ICD-10-CM | POA: Diagnosis not present

## 2022-08-30 DIAGNOSIS — N2581 Secondary hyperparathyroidism of renal origin: Secondary | ICD-10-CM | POA: Diagnosis not present

## 2022-08-30 DIAGNOSIS — N186 End stage renal disease: Secondary | ICD-10-CM | POA: Diagnosis not present

## 2022-09-01 DIAGNOSIS — N186 End stage renal disease: Secondary | ICD-10-CM | POA: Diagnosis not present

## 2022-09-01 DIAGNOSIS — Z992 Dependence on renal dialysis: Secondary | ICD-10-CM | POA: Diagnosis not present

## 2022-09-01 DIAGNOSIS — N2581 Secondary hyperparathyroidism of renal origin: Secondary | ICD-10-CM | POA: Diagnosis not present

## 2022-09-03 DIAGNOSIS — N2581 Secondary hyperparathyroidism of renal origin: Secondary | ICD-10-CM | POA: Diagnosis not present

## 2022-09-03 DIAGNOSIS — I129 Hypertensive chronic kidney disease with stage 1 through stage 4 chronic kidney disease, or unspecified chronic kidney disease: Secondary | ICD-10-CM | POA: Diagnosis not present

## 2022-09-03 DIAGNOSIS — N186 End stage renal disease: Secondary | ICD-10-CM | POA: Diagnosis not present

## 2022-09-03 DIAGNOSIS — Z992 Dependence on renal dialysis: Secondary | ICD-10-CM | POA: Diagnosis not present

## 2022-09-06 DIAGNOSIS — M79675 Pain in left toe(s): Secondary | ICD-10-CM | POA: Diagnosis not present

## 2022-09-06 DIAGNOSIS — N2581 Secondary hyperparathyroidism of renal origin: Secondary | ICD-10-CM | POA: Diagnosis not present

## 2022-09-06 DIAGNOSIS — Z992 Dependence on renal dialysis: Secondary | ICD-10-CM | POA: Diagnosis not present

## 2022-09-06 DIAGNOSIS — M2041 Other hammer toe(s) (acquired), right foot: Secondary | ICD-10-CM | POA: Diagnosis not present

## 2022-09-06 DIAGNOSIS — N186 End stage renal disease: Secondary | ICD-10-CM | POA: Diagnosis not present

## 2022-09-06 DIAGNOSIS — M2042 Other hammer toe(s) (acquired), left foot: Secondary | ICD-10-CM | POA: Diagnosis not present

## 2022-09-06 DIAGNOSIS — M79674 Pain in right toe(s): Secondary | ICD-10-CM | POA: Diagnosis not present

## 2022-09-06 DIAGNOSIS — B351 Tinea unguium: Secondary | ICD-10-CM | POA: Diagnosis not present

## 2022-09-08 DIAGNOSIS — N186 End stage renal disease: Secondary | ICD-10-CM | POA: Diagnosis not present

## 2022-09-08 DIAGNOSIS — N2581 Secondary hyperparathyroidism of renal origin: Secondary | ICD-10-CM | POA: Diagnosis not present

## 2022-09-08 DIAGNOSIS — Z992 Dependence on renal dialysis: Secondary | ICD-10-CM | POA: Diagnosis not present

## 2022-09-10 DIAGNOSIS — Z992 Dependence on renal dialysis: Secondary | ICD-10-CM | POA: Diagnosis not present

## 2022-09-10 DIAGNOSIS — N186 End stage renal disease: Secondary | ICD-10-CM | POA: Diagnosis not present

## 2022-09-13 DIAGNOSIS — N186 End stage renal disease: Secondary | ICD-10-CM | POA: Diagnosis not present

## 2022-09-13 DIAGNOSIS — N2581 Secondary hyperparathyroidism of renal origin: Secondary | ICD-10-CM | POA: Diagnosis not present

## 2022-09-13 DIAGNOSIS — Z992 Dependence on renal dialysis: Secondary | ICD-10-CM | POA: Diagnosis not present

## 2022-09-15 DIAGNOSIS — Z992 Dependence on renal dialysis: Secondary | ICD-10-CM | POA: Diagnosis not present

## 2022-09-15 DIAGNOSIS — N186 End stage renal disease: Secondary | ICD-10-CM | POA: Diagnosis not present

## 2022-09-15 DIAGNOSIS — N2581 Secondary hyperparathyroidism of renal origin: Secondary | ICD-10-CM | POA: Diagnosis not present

## 2022-09-17 DIAGNOSIS — N2581 Secondary hyperparathyroidism of renal origin: Secondary | ICD-10-CM | POA: Diagnosis not present

## 2022-09-17 DIAGNOSIS — N186 End stage renal disease: Secondary | ICD-10-CM | POA: Diagnosis not present

## 2022-09-17 DIAGNOSIS — Z992 Dependence on renal dialysis: Secondary | ICD-10-CM | POA: Diagnosis not present

## 2022-09-20 DIAGNOSIS — Z992 Dependence on renal dialysis: Secondary | ICD-10-CM | POA: Diagnosis not present

## 2022-09-20 DIAGNOSIS — N186 End stage renal disease: Secondary | ICD-10-CM | POA: Diagnosis not present

## 2022-09-20 DIAGNOSIS — N2581 Secondary hyperparathyroidism of renal origin: Secondary | ICD-10-CM | POA: Diagnosis not present

## 2022-09-22 DIAGNOSIS — N2581 Secondary hyperparathyroidism of renal origin: Secondary | ICD-10-CM | POA: Diagnosis not present

## 2022-09-22 DIAGNOSIS — Z992 Dependence on renal dialysis: Secondary | ICD-10-CM | POA: Diagnosis not present

## 2022-09-22 DIAGNOSIS — N186 End stage renal disease: Secondary | ICD-10-CM | POA: Diagnosis not present

## 2022-09-24 DIAGNOSIS — Z992 Dependence on renal dialysis: Secondary | ICD-10-CM | POA: Diagnosis not present

## 2022-09-24 DIAGNOSIS — N186 End stage renal disease: Secondary | ICD-10-CM | POA: Diagnosis not present

## 2022-09-24 DIAGNOSIS — N2581 Secondary hyperparathyroidism of renal origin: Secondary | ICD-10-CM | POA: Diagnosis not present

## 2022-09-27 DIAGNOSIS — Z992 Dependence on renal dialysis: Secondary | ICD-10-CM | POA: Diagnosis not present

## 2022-09-27 DIAGNOSIS — N2581 Secondary hyperparathyroidism of renal origin: Secondary | ICD-10-CM | POA: Diagnosis not present

## 2022-09-27 DIAGNOSIS — N186 End stage renal disease: Secondary | ICD-10-CM | POA: Diagnosis not present

## 2022-09-29 DIAGNOSIS — N186 End stage renal disease: Secondary | ICD-10-CM | POA: Diagnosis not present

## 2022-09-29 DIAGNOSIS — Z992 Dependence on renal dialysis: Secondary | ICD-10-CM | POA: Diagnosis not present

## 2022-09-29 DIAGNOSIS — N2581 Secondary hyperparathyroidism of renal origin: Secondary | ICD-10-CM | POA: Diagnosis not present

## 2022-10-01 DIAGNOSIS — N2581 Secondary hyperparathyroidism of renal origin: Secondary | ICD-10-CM | POA: Diagnosis not present

## 2022-10-01 DIAGNOSIS — Z992 Dependence on renal dialysis: Secondary | ICD-10-CM | POA: Diagnosis not present

## 2022-10-01 DIAGNOSIS — N186 End stage renal disease: Secondary | ICD-10-CM | POA: Diagnosis not present

## 2022-10-03 DIAGNOSIS — I129 Hypertensive chronic kidney disease with stage 1 through stage 4 chronic kidney disease, or unspecified chronic kidney disease: Secondary | ICD-10-CM | POA: Diagnosis not present

## 2022-10-03 DIAGNOSIS — N186 End stage renal disease: Secondary | ICD-10-CM | POA: Diagnosis not present

## 2022-10-03 DIAGNOSIS — Z992 Dependence on renal dialysis: Secondary | ICD-10-CM | POA: Diagnosis not present

## 2022-10-04 DIAGNOSIS — N2581 Secondary hyperparathyroidism of renal origin: Secondary | ICD-10-CM | POA: Diagnosis not present

## 2022-10-04 DIAGNOSIS — N186 End stage renal disease: Secondary | ICD-10-CM | POA: Diagnosis not present

## 2022-10-04 DIAGNOSIS — Z992 Dependence on renal dialysis: Secondary | ICD-10-CM | POA: Diagnosis not present

## 2022-10-06 DIAGNOSIS — Z992 Dependence on renal dialysis: Secondary | ICD-10-CM | POA: Diagnosis not present

## 2022-10-06 DIAGNOSIS — N186 End stage renal disease: Secondary | ICD-10-CM | POA: Diagnosis not present

## 2022-10-06 DIAGNOSIS — N2581 Secondary hyperparathyroidism of renal origin: Secondary | ICD-10-CM | POA: Diagnosis not present

## 2022-10-08 DIAGNOSIS — Z992 Dependence on renal dialysis: Secondary | ICD-10-CM | POA: Diagnosis not present

## 2022-10-08 DIAGNOSIS — N186 End stage renal disease: Secondary | ICD-10-CM | POA: Diagnosis not present

## 2022-10-11 DIAGNOSIS — N2581 Secondary hyperparathyroidism of renal origin: Secondary | ICD-10-CM | POA: Diagnosis not present

## 2022-10-11 DIAGNOSIS — Z992 Dependence on renal dialysis: Secondary | ICD-10-CM | POA: Diagnosis not present

## 2022-10-11 DIAGNOSIS — N186 End stage renal disease: Secondary | ICD-10-CM | POA: Diagnosis not present

## 2022-10-13 DIAGNOSIS — N186 End stage renal disease: Secondary | ICD-10-CM | POA: Diagnosis not present

## 2022-10-13 DIAGNOSIS — N2581 Secondary hyperparathyroidism of renal origin: Secondary | ICD-10-CM | POA: Diagnosis not present

## 2022-10-13 DIAGNOSIS — Z992 Dependence on renal dialysis: Secondary | ICD-10-CM | POA: Diagnosis not present

## 2022-10-15 DIAGNOSIS — Z992 Dependence on renal dialysis: Secondary | ICD-10-CM | POA: Diagnosis not present

## 2022-10-15 DIAGNOSIS — N186 End stage renal disease: Secondary | ICD-10-CM | POA: Diagnosis not present

## 2022-10-15 DIAGNOSIS — N2581 Secondary hyperparathyroidism of renal origin: Secondary | ICD-10-CM | POA: Diagnosis not present

## 2022-10-18 DIAGNOSIS — Z992 Dependence on renal dialysis: Secondary | ICD-10-CM | POA: Diagnosis not present

## 2022-10-18 DIAGNOSIS — N2581 Secondary hyperparathyroidism of renal origin: Secondary | ICD-10-CM | POA: Diagnosis not present

## 2022-10-18 DIAGNOSIS — N186 End stage renal disease: Secondary | ICD-10-CM | POA: Diagnosis not present

## 2022-10-20 DIAGNOSIS — Z992 Dependence on renal dialysis: Secondary | ICD-10-CM | POA: Diagnosis not present

## 2022-10-20 DIAGNOSIS — N186 End stage renal disease: Secondary | ICD-10-CM | POA: Diagnosis not present

## 2022-10-20 DIAGNOSIS — N2581 Secondary hyperparathyroidism of renal origin: Secondary | ICD-10-CM | POA: Diagnosis not present

## 2022-10-22 DIAGNOSIS — Z992 Dependence on renal dialysis: Secondary | ICD-10-CM | POA: Diagnosis not present

## 2022-10-22 DIAGNOSIS — N186 End stage renal disease: Secondary | ICD-10-CM | POA: Diagnosis not present

## 2022-10-22 DIAGNOSIS — N2581 Secondary hyperparathyroidism of renal origin: Secondary | ICD-10-CM | POA: Diagnosis not present

## 2022-10-24 DIAGNOSIS — S98139A Complete traumatic amputation of one unspecified lesser toe, initial encounter: Secondary | ICD-10-CM | POA: Diagnosis not present

## 2022-10-24 DIAGNOSIS — N186 End stage renal disease: Secondary | ICD-10-CM | POA: Diagnosis not present

## 2022-10-24 DIAGNOSIS — R5383 Other fatigue: Secondary | ICD-10-CM | POA: Diagnosis not present

## 2022-10-24 DIAGNOSIS — Z992 Dependence on renal dialysis: Secondary | ICD-10-CM | POA: Diagnosis not present

## 2022-10-24 DIAGNOSIS — D6859 Other primary thrombophilia: Secondary | ICD-10-CM | POA: Diagnosis not present

## 2022-10-24 DIAGNOSIS — M109 Gout, unspecified: Secondary | ICD-10-CM | POA: Diagnosis not present

## 2022-10-24 DIAGNOSIS — M542 Cervicalgia: Secondary | ICD-10-CM | POA: Diagnosis not present

## 2022-10-24 DIAGNOSIS — E1129 Type 2 diabetes mellitus with other diabetic kidney complication: Secondary | ICD-10-CM | POA: Diagnosis not present

## 2022-10-24 DIAGNOSIS — I10 Essential (primary) hypertension: Secondary | ICD-10-CM | POA: Diagnosis not present

## 2022-10-25 DIAGNOSIS — N186 End stage renal disease: Secondary | ICD-10-CM | POA: Diagnosis not present

## 2022-10-25 DIAGNOSIS — Z992 Dependence on renal dialysis: Secondary | ICD-10-CM | POA: Diagnosis not present

## 2022-10-25 DIAGNOSIS — N2581 Secondary hyperparathyroidism of renal origin: Secondary | ICD-10-CM | POA: Diagnosis not present

## 2022-10-27 DIAGNOSIS — Z992 Dependence on renal dialysis: Secondary | ICD-10-CM | POA: Diagnosis not present

## 2022-10-27 DIAGNOSIS — N186 End stage renal disease: Secondary | ICD-10-CM | POA: Diagnosis not present

## 2022-10-27 DIAGNOSIS — N2581 Secondary hyperparathyroidism of renal origin: Secondary | ICD-10-CM | POA: Diagnosis not present

## 2022-10-29 DIAGNOSIS — Z992 Dependence on renal dialysis: Secondary | ICD-10-CM | POA: Diagnosis not present

## 2022-10-29 DIAGNOSIS — N186 End stage renal disease: Secondary | ICD-10-CM | POA: Diagnosis not present

## 2022-10-29 DIAGNOSIS — N2581 Secondary hyperparathyroidism of renal origin: Secondary | ICD-10-CM | POA: Diagnosis not present

## 2022-10-31 ENCOUNTER — Encounter: Payer: Self-pay | Admitting: Cardiology

## 2022-10-31 ENCOUNTER — Ambulatory Visit: Payer: Medicare HMO | Attending: Cardiology | Admitting: Cardiology

## 2022-10-31 VITALS — BP 140/86 | HR 80 | Ht 69.0 in | Wt 211.2 lb

## 2022-10-31 DIAGNOSIS — R0600 Dyspnea, unspecified: Secondary | ICD-10-CM | POA: Diagnosis not present

## 2022-10-31 DIAGNOSIS — R5382 Chronic fatigue, unspecified: Secondary | ICD-10-CM

## 2022-10-31 DIAGNOSIS — I10 Essential (primary) hypertension: Secondary | ICD-10-CM

## 2022-10-31 DIAGNOSIS — R011 Cardiac murmur, unspecified: Secondary | ICD-10-CM | POA: Diagnosis not present

## 2022-10-31 NOTE — Addendum Note (Signed)
Addended by: Luellen Pucker on: 10/31/2022 05:24 PM   Modules accepted: Orders

## 2022-10-31 NOTE — Addendum Note (Signed)
Addended by: Anselm Pancoast R on: 10/31/2022 05:20 PM   Modules accepted: Orders

## 2022-10-31 NOTE — Patient Instructions (Addendum)
Medication Instructions:  Your physician recommends that you continue on your current medications as directed. Please refer to the Current Medication list given to you today.  *If you need a refill on your cardiac medications before your next appointment, please call your pharmacy*   Lab Work: None.  If you have labs (blood work) drawn today and your tests are completely normal, you will receive your results only by: MyChart Message (if you have MyChart) OR A paper copy in the mail If you have any lab test that is abnormal or we need to change your treatment, we will call you to review the results.   Testing/Procedures: Your physician has requested that you have an echocardiogram. Echocardiography is a painless test that uses sound waves to create images of your heart. It provides your doctor with information about the size and shape of your heart and how well your heart's chambers and valves are working. This procedure takes approximately one hour. There are no restrictions for this procedure. Please do NOT wear cologne, perfume, aftershave, or lotions (deodorant is allowed). Please arrive 15 minutes prior to your appointment time.  Your physician has recommended that you have a split night sleep study. This test records several body functions during sleep, including: brain activity, eye movement, oxygen and carbon dioxide blood levels, heart rate and rhythm, breathing rate and rhythm, the flow of air through your mouth and nose, snoring, body muscle movements, and chest and belly movement.   How to Prepare for Your Cardiac PET/CT Stress Test:  1. Please do not take these medications before your test:   Medications that may interfere with the cardiac pharmacological stress agent (ex. nitrates - including erectile dysfunction medications, isosorbide mononitrate- [please start to hold this medication the day before the test], tamulosin or beta-blockers) the day of the exam. (Erectile  dysfunction medication should be held for at least 72 hrs prior to test) Theophylline containing medications for 12 hours. Dipyridamole 48 hours prior to the test. Your remaining medications may be taken with water.  2. Nothing to eat or drink, except water, 3 hours prior to arrival time.   NO caffeine/decaffeinated products, or chocolate 12 hours prior to arrival.  3. NO perfume, cologne or lotion on chest or abdomen area.            4. Total time is 1 to 2 hours; you may want to bring reading material for the waiting time.  5. Please report to Radiology at the [facility] Main Entrance 30 minutes early for your test.  [address] [place], [place] [address]      IF YOU THINK YOU MAY BE PREGNANT, OR ARE NURSING PLEASE INFORM THE TECHNOLOGIST.  In preparation for your appointment, medication and supplies will be purchased.  Appointment availability is limited, so if you need to cancel or reschedule, please call the Radiology Department at [phone] [facility]) [place] [phone] [facility])  24 hours in advance to avoid a cancellation fee of $100.00  What to Expect After you Arrive:  Once you arrive and check in for your appointment, you will be taken to a preparation room within the Radiology Department.  A technologist or Nurse will obtain your medical history, verify that you are correctly prepped for the exam, and explain the procedure.  Afterwards,  an IV will be started in your arm and electrodes will be placed on your skin for EKG monitoring during the stress portion of the exam. Then you will be escorted  to the PET/CT scanner.  There, staff will get you positioned on the scanner and obtain a blood pressure and EKG.  During the exam, you will continue to be connected to the EKG and blood pressure machines.  A small, safe amount of a radioactive tracer will be injected in your IV to obtain a series of pictures of your heart along with an injection of a stress agent.     After your Exam:  It is recommended that you eat a meal and drink a caffeinated beverage to counter act any effects of the stress agent.  Drink plenty of fluids for the remainder of the day and urinate frequently for the first couple of hours after the exam.  Your doctor will inform you of your test results within 7-10 business days.  For more information and frequently asked questions, please visit our website : [email or web address]  For questions about your test or how to prepare for your test, please call: Cardiac Imaging Nurse Navigators Office: [phone]     Follow-Up: At [facility], you and your health needs are our priority.  As part of our continuing mission to provide you with exceptional heart care, we have created designated Provider Care Teams.  These Care Teams include your primary Cardiologist (physician) and Advanced Practice Providers (APPs -  Physician Assistants and Nurse Practitioners) who all work together to provide you with the care you need, when you need it.  We recommend signing up for the patient portal called "MyChart".  Sign up information is provided on this After Visit Summary.  MyChart is used to connect with patients for Virtual Visits (Telemedicine).  Patients are able to view lab/test results, encounter notes, upcoming appointments, etc.  Non-urgent messages can be sent to your provider as well.   To learn more about what you can do with MyChart, go to [email or web address].    Your next appointment will be dependent on your testing and it will be with:     Provider:   Dr. Armanda Magic, MD

## 2022-10-31 NOTE — Progress Notes (Signed)
Cardiology CONSULT Note    Date:  10/31/2022   ID:  Duane Ortiz, DOB [birth year], MRN [id number]  PCP:  Duane Ferretti, DO  Cardiologist:  Duane Magic, MD   Chief Complaint  Patient presents with   New Patient (Initial Visit)    Chronic fatigue and hypertension    Patient Profile: Duane Ortiz is a 72 y.o. male who is being seen today for the evaluation of LV function and at the request of Duane Ferretti, DO.  History of Present Illness:  Duane Ortiz is a 72 y.o. male who is being seen today for the evaluation of LV function at the request of Duane Ferretti, DO.  This is a 72 year old male with a history of end-stage renal disease on HD, hypertension, chronic pain, gout, history of PE in the 90s, hypertension, ongoing tobacco abuse apparently not interested in smoking cessation and problems with chronic fatigue.  When he was last seen a few days ago by his PCP he was complaining of fatigue.  He was worried that they were taking too much fluid off with dialysis.  He was not orthostatic on exam but was complaining also of cramping.  He has not had any problems with chest pain or pressure or shortness of breath.  Apparently an EKG in the office showed T wave flattening in the lateral leads but similar to prior EKG in 2021.  He is now referred to cardiology for evaluation of fatigue to make sure this is not a cardiac issue.  He has no history of cardiac disease but has cardiac risk factors including hyperlipidemia hypertension, diabetes mellitus with peripheral neuropathy and end-stage renal disease on HD.  He also has obstructive sleep apnea and wore a CPAP 10 years ago but no longer uses it.  He says that he could not breath with there CPAP. He is very sedentary due to chronic pain and uses a cane for ambulation.  He does have a toe amputation in the past.  He denies any any exertional chest pain or pressure, PND, orthopnea, dizziness or syncope.  He tells me that after his HD he  feels SOB and comes home and has to lay down.  He occasionally has some LE edema. Those are also the days he feels tired.   Past Medical History:  Diagnosis Date   Arthritis    Chronic kidney disease    Chronic kidney disease    Diabetes (HCC) 02/012017   Fainting    Gait abnormality 10/11/2016   GERD (gastroesophageal reflux disease)    Gout    Headache    Hypercholesteremia    Hypercholesterolemia    Hypertension    Peripheral neuropathy 04/02/2019   Pulmonary embolism (HCC)    Shortness of breath    Sleep apnea    does wear cpap   Wears dentures     Past Surgical History:  Procedure Laterality Date   A/V SHUNTOGRAM N/A 09/16/2019   Procedure: A/V SHUNTOGRAM - Left Arm;  Surgeon: Duane Harman, MD;  Location[facility] INVASIVE CV LAB;  Service: Cardiovascular;  Laterality: N/A;   AMPUTATION TOE Left 07/01/2020   Procedure: AMPUTATION OF LEFT HALLUS;  Surgeon: Duane Ortiz, DPM;  Location: [facility] ORS;  Service: Podiatry;  Laterality: Left;   AV FISTULA PLACEMENT Left 08/15/2019   Procedure: LEFT ARM ARTERIOVENOUS GORE-TEX GRAFT;  Surgeon: Duane Libman, MD;  Location: [facility] OR;  Service: Vascular;  Laterality: Left;   CERVICAL DISCECTOMY     COLONOSCOPY  MULTIPLE TOOTH EXTRACTIONS     neck spine disk surgery  01/03/1993   PERIPHERAL VASCULAR INTERVENTION Left 09/16/2019   Procedure: PERIPHERAL VASCULAR INTERVENTION;  Surgeon: Duane Harman, MD;  Location: [facility] INVASIVE CV LAB;  Service: Cardiovascular;  Laterality: Left;  ARM FISTULS   WRIST SURGERY      Current Medications: Current Meds  Medication Sig   allopurinol (ZYLOPRIM) 100 MG tablet Take 100 mg by mouth at bedtime.   amLODipine (NORVASC) 10 MG tablet Take 10 mg by mouth at bedtime.   ammonium lactate (LAC-HYDRIN) 12 % lotion APPLY EXTERNALLY TO AFFECTED SKIN AREAS AS NEEDED FOR ITCHING External Twice a day for 90 days   ethyl chloride spray Apply topically.   ferric citrate (AURYXIA) 1 GM 210  MG(Fe) tablet Take 420 mg by mouth 3 (three) times daily with meals.   furosemide (LASIX) 20 MG tablet Take 20 mg by mouth at bedtime.   hydrALAZINE (APRESOLINE) 25 MG tablet Take 1 tablet (25 mg total) by mouth 3 (three) times daily.   lidocaine (LMX) 4 % cream APPLY A SMALL AMOUNT TO LEFT ARM FISTULA 30 MINUTES BEFORE DIALYSIS External Three times a day for 30 days As needed   olmesartan (BENICAR) 20 MG tablet Take 20 mg by mouth daily.   oxyCODONE-acetaminophen (PERCOCET) 5-325 MG tablet Take 1 tablet by mouth every 4 (four) hours as needed for severe pain.   sodium bicarbonate 650 MG tablet Take by mouth.   tamsulosin (FLOMAX) 0.4 MG CAPS capsule Take 0.4 mg by mouth at bedtime.    [DISCONTINUED] amLODipine (NORVASC) 5 MG tablet Take 1 tablet (5 mg total) by mouth daily.   [DISCONTINUED] amoxicillin-clavulanate (AUGMENTIN) 875-125 MG tablet Take 1 tablet by mouth every 12 (twelve) hours.   [DISCONTINUED] apixaban (ELIQUIS) 5 MG TABS tablet Take 1 tablet (5 mg total) by mouth 2 (two) times daily.   [DISCONTINUED] HYDROcodone-acetaminophen (NORCO) 5-325 MG tablet Take 1 tablet by mouth every 6 (six) hours as needed for moderate pain.   [DISCONTINUED] nebivolol (BYSTOLIC) 10 MG tablet Take 10 mg by mouth at bedtime.    [DISCONTINUED] oxyCODONE-acetaminophen (PERCOCET) 5-325 MG tablet Take 1-2 tablets by mouth every 4 (four) hours as needed for severe pain.    Allergies:   Darvon [propoxyphene], Baclofen, Gabapentin, and Sulfa antibiotics   Social History   Socioeconomic History   Marital status: Divorced    Spouse name: Not on file   Number of children: 3   Years of education: 10   Highest education level: Not on file  Occupational History   Not on file  Tobacco Use   Smoking status: Some Days    Current packs/day: 0.25    Types: Cigarettes   Smokeless tobacco: Never  Vaping Use   Vaping status: Never Used  Substance and Sexual Activity   Alcohol use: Not Currently     Alcohol/week: 0.0 standard drinks of alcohol   Drug use: No   Sexual activity: Not on file  Other Topics Concern   Not on file  Social History Narrative   Lives alone   Caffeine use: Sometimes   Right handed    Social Determinants of Health   Financial Resource Strain: Not on file  Food Insecurity: Not on file  Transportation Needs: Not on file  Physical Activity: Not on file  Stress: Not on file  Social Connections: Unknown (05/14/2021)   Received from [facility]   Social Network    Social Network: Not on file  Family History:  The patient's family history includes Breast cancer in his paternal grandmother; Diabetes in his brother, mother, and sister; Heart disease in his mother; Hypertension in his brother, mother, and sister.   ROS:   Please see the history of present illness.    ROS All other systems reviewed and are negative.      No data to display             PHYSICAL EXAM:   VS:  BP (!) 140/86   Pulse 80   Ht 5\' 9"  (1.753 m)   Wt 211 lb 3.2 oz (95.8 kg)   SpO2 98%   BMI 31.19 kg/m    GEN: Well nourished, well developed, in no acute distress  HEENT: normal  Neck: no JVD, carotid bruits, or masses Cardiac: RRR; no rubs, or gallops,no edema.  Intact distal pulses bilaterally. 2/6 SM at RUSB to LUSB Respiratory:  clear to auscultation bilaterally, normal work of breathing GI: soft, nontender, nondistended, + BS MS: no deformity or atrophy  Skin: warm and dry, no rash Neuro:  Alert and Oriented x 3, Strength and sensation are intact Psych: euthymic mood, full affect  Wt Readings from Last 3 Encounters:  10/31/22 211 lb 3.2 oz (95.8 kg)  07/01/20 210 lb 3.2 oz (95.3 kg)  06/22/20 210 lb 3.2 oz (95.3 kg)      Studies/Labs Reviewed:           Recent Labs: No results found for requested labs within last 365 days.   Lipid Panel No results found for: "CHOL", "TRIG", "HDL", "CHOLHDL", "VLDL", "LDLCALC",  "LDLDIRECT"  Additional studies/ records that were reviewed today include:  Office visit notes from [facility] DO.    ASSESSMENT:    1. Chronic fatigue   2. Essential hypertension      PLAN:  In order of problems listed above:  #Chronic fatigue -He has multiple reasons for chronic fatigue including end-stage renal disease on hemodialysis, sedentary state due to chronic pain -His PCP said that he was not orthostatic on exam so likely not having too much fluid pulled off with dialysis but that is to be determined by nephrology who manages his dialysis sessions -TSH in May 2024 was normal at 1.05 -Vitamin D level 05/13/2022 was very low at 23.2 and could be contributing to chronic fatigue will defer to PCP>> currently not taking supplementation -EKG today in the office shows chronic nonspecific T wave abnormality -He does have multiple risk factors for coronary disease (DM, HTN, ongoing tobacco use, HLD) so I have recommended getting a stress PET CT to rule out ischemia.  Given his end-stage renal disease would not pursue coronary CTA as he likely has significant coronary calcifications and would be poor quality. -Informed Consent   Shared Decision Making/Informed Consent{ All outpatient stress tests require an informed consent ([id number]) ATTESTATION ORDER        The risks [chest pain, shortness of breath, cardiac arrhythmias, dizziness, blood pressure fluctuations, myocardial infarction, stroke/transient ischemic attack, nausea, vomiting, allergic reaction, radiation exposure, metallic taste sensation and life-threatening complications (estimated to be 1 in 10,000)], benefits (risk stratification, diagnosing coronary artery disease, treatment guidance) and alternatives of a cardiac PET stress test were discussed in detail with Mr. Vittitow and he agrees to proceed. 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 This time includes coordination of care and counseling regarding above mentioned problem list. Remainder of non-face-to-face time involved reviewing chart documents/testing relevant to the patient encounter and documentation in the medical record. I have independently reviewed documentation from referring provider  Followup:  PRN  Medication Adjustments/Labs and Tests Ordered: Current medicines are reviewed at length with the patient today.  Concerns regarding medicines are outlined above.  Medication changes, Labs and Tests ordered today are listed in the Patient Instructions below.  There are no Patient Instructions on file for this visit.   Signed, Duane Magic, MD  10/31/2022 5:08 PM    [facility] [address] [place], [place], [place]  [address] Phone: [phone]; Fax: [phone]

## 2022-10-31 NOTE — Addendum Note (Signed)
Addended by: Luellen Pucker on: 10/31/2022 05:51 PM   Modules accepted: Orders

## 2022-11-01 DIAGNOSIS — N186 End stage renal disease: Secondary | ICD-10-CM | POA: Diagnosis not present

## 2022-11-01 DIAGNOSIS — N2581 Secondary hyperparathyroidism of renal origin: Secondary | ICD-10-CM | POA: Diagnosis not present

## 2022-11-01 DIAGNOSIS — Z992 Dependence on renal dialysis: Secondary | ICD-10-CM | POA: Diagnosis not present

## 2022-11-01 NOTE — Addendum Note (Signed)
Addended by: Quintella Reichert on: 11/01/2022 06:21 AM   Modules accepted: Orders

## 2022-11-02 ENCOUNTER — Ambulatory Visit (INDEPENDENT_AMBULATORY_CARE_PROVIDER_SITE_OTHER): Payer: Medicare HMO | Admitting: Surgery

## 2022-11-02 ENCOUNTER — Encounter: Payer: Self-pay | Admitting: Surgery

## 2022-11-02 VITALS — BP 154/69 | HR 89 | Temp 97.9°F | Ht 69.0 in | Wt 211.0 lb

## 2022-11-02 DIAGNOSIS — Z992 Dependence on renal dialysis: Secondary | ICD-10-CM | POA: Diagnosis not present

## 2022-11-02 DIAGNOSIS — N186 End stage renal disease: Secondary | ICD-10-CM | POA: Diagnosis not present

## 2022-11-02 NOTE — Progress Notes (Signed)
[facility]  Patient name: Duane Ortiz MRN: [id number] DOB: 1950-12-07 Sex: male   REASON FOR VISIT:   Dialysis access issues  HISOTRY OF PRESENT ILLNESS:    Duane Ortiz is a 72 y.o. male with end-stage renal disease who is referred back by Dr. Ronalee Belts for evaluation of his access.  He underwent left upper arm dialysis graft on 08/15/2019.  He had the venous outflow stented with a 7 x 50 Viabahn on 09/16/2019.  He now comes in with complaints of itching over his access site as well as difficulty with cannulation.  The patient has a history of DVT and PE.  He is a diabetic as well as a smoker. PAST MEDICAL HISTORY:   Past Medical History:  Diagnosis Date   Arthritis    Chronic kidney disease    Chronic kidney disease    Diabetes (HCC) 02/012017   Fainting    Gait abnormality 10/11/2016   GERD (gastroesophageal reflux disease)    Gout    Headache    Hypercholesteremia    Hypercholesterolemia    Hypertension    Peripheral neuropathy 04/02/2019   Pulmonary embolism (HCC)    Shortness of breath    Sleep apnea    does wear cpap   Wears dentures      FAMILY HISTORY:   Family History  Problem Relation Age of Onset   Diabetes Mother    Heart disease Mother    Hypertension Mother    Diabetes Sister    Hypertension Sister    Diabetes Brother    Hypertension Brother    Breast cancer Paternal Grandmother     SOCIAL HISTORY:   Social History   Tobacco Use   Smoking status: Some Days    Current packs/day: 0.25    Types: Cigarettes   Smokeless tobacco: Never  Substance Use Topics   Alcohol use: Not Currently    Alcohol/week: 0.0 standard drinks of alcohol     ALLERGIES:   Allergies  Allergen Reactions   Darvon [Propoxyphene] Itching   Baclofen Other (See Comments)    confusion   Gabapentin     Felt like I was out of my head, slept all day, confusion    Sulfa Antibiotics Itching      CURRENT MEDICATIONS:   Current Outpatient Medications  Medication Sig Dispense Refill   allopurinol (ZYLOPRIM) 100 MG tablet Take 100 mg by mouth at bedtime.     amLODipine (NORVASC) 10 MG tablet Take 10 mg by mouth at bedtime.     ammonium lactate (LAC-HYDRIN) 12 % lotion APPLY EXTERNALLY TO AFFECTED SKIN AREAS AS NEEDED FOR ITCHING External Twice a day for 90 days     ethyl chloride spray Apply topically.     ferric citrate (AURYXIA) 1 GM 210 MG(Fe) tablet Take 420 mg by mouth 3 (three) times daily with meals.     furosemide (LASIX) 20 MG tablet Take 20 mg by mouth at bedtime.     hydrALAZINE (APRESOLINE) 25 MG tablet Take 1 tablet (25 mg total) by mouth 3 (three) times daily. 90 tablet 1   lidocaine (LMX) 4 % cream APPLY A SMALL AMOUNT TO LEFT ARM FISTULA 30 MINUTES BEFORE DIALYSIS External Three times a day for 30 days As needed     olmesartan (BENICAR) 20 MG tablet Take 20 mg by mouth daily.     oxyCODONE-acetaminophen (PERCOCET) 5-325 MG tablet Take 1 tablet by mouth every 4 (four) hours as needed for severe pain.  30 tablet 0   sodium bicarbonate 650 MG tablet Take by mouth.     tamsulosin (FLOMAX) 0.4 MG CAPS capsule Take 0.4 mg by mouth at bedtime.      No current facility-administered medications for this visit.    REVIEW OF SYSTEMS:   [X]  denotes positive finding, [ ]  denotes negative finding Cardiac  Comments:  Chest pain or chest pressure:    Shortness of breath upon exertion:    Short of breath when lying flat:    Irregular heart rhythm:        Vascular    Pain in calf, thigh, or hip brought on by ambulation:    Pain in feet at night that wakes you up from your sleep:     Blood clot in your veins:    Leg swelling:         Pulmonary    Oxygen at home:    Productive cough:     Wheezing:         Neurologic    Sudden weakness in arms or legs:     Sudden numbness in arms or legs:     Sudden onset of difficulty speaking or slurred speech:    Temporary loss  of vision in one eye:     Problems with dizziness:         Gastrointestinal    Blood in stool:     Vomited blood:         Genitourinary    Burning when urinating:     Blood in urine:        Psychiatric    Major depression:         Hematologic    Bleeding problems:    Problems with blood clotting too easily:        Skin    Rashes or ulcers:        Constitutional    Fever or chills:      PHYSICAL EXAM:   Vitals:   11/02/22 0836  BP: (!) 154/69  Pulse: 89  Temp: 97.9 F (36.6 C)  TempSrc: Temporal  SpO2: 95%  Weight: 211 lb (95.7 kg)  Height: 5\' 9"  (1.753 m)    GENERAL: The patient is a well-nourished male, in no acute distress. The vital signs are documented above. CARDIAC: There is a regular rate and rhythm.  VASCULAR: Pulsatile appearance to the left upper arm graft PULMONARY: Non-labored respirations MUSCULOSKELETAL: There are no major deformities or cyanosis. NEUROLOGIC: No focal weakness or paresthesias are detected. SKIN: There are no ulcers or rashes noted. PSYCHIATRIC: The patient has a normal affect.  STUDIES:   None  MEDICAL ISSUES:   End-stage renal disease: Patient is having difficulty with cannulation of his graft.  He does not endorse swelling or pain.  The graft is pulsatile on exam.  I have recommended proceeding with shuntogram to better evaluate his graft and intervene on any potential stenoses or issues.  This will be scheduled in the near future.  He is working on transportation.  I talked to him about using Benadryl ointment to help with the dryness of his skin as well as the itching.    Charlena Cross, MD, FACS [facility] [phone] Pager [phone]

## 2022-11-02 NOTE — H&P (View-Only) (Signed)
 [facility]  Patient name: Duane Ortiz MRN: [id number] DOB: 1950-12-07 Sex: male   REASON FOR VISIT:   Dialysis access issues  HISOTRY OF PRESENT ILLNESS:    Duane Ortiz is a 72 y.o. male with end-stage renal disease who is referred back by Dr. Ronalee Belts for evaluation of his access.  He underwent left upper arm dialysis graft on 08/15/2019.  He had the venous outflow stented with a 7 x 50 Viabahn on 09/16/2019.  He now comes in with complaints of itching over his access site as well as difficulty with cannulation.  The patient has a history of DVT and PE.  He is a diabetic as well as a smoker. PAST MEDICAL HISTORY:   Past Medical History:  Diagnosis Date   Arthritis    Chronic kidney disease    Chronic kidney disease    Diabetes (HCC) 02/012017   Fainting    Gait abnormality 10/11/2016   GERD (gastroesophageal reflux disease)    Gout    Headache    Hypercholesteremia    Hypercholesterolemia    Hypertension    Peripheral neuropathy 04/02/2019   Pulmonary embolism (HCC)    Shortness of breath    Sleep apnea    does wear cpap   Wears dentures      FAMILY HISTORY:   Family History  Problem Relation Age of Onset   Diabetes Mother    Heart disease Mother    Hypertension Mother    Diabetes Sister    Hypertension Sister    Diabetes Brother    Hypertension Brother    Breast cancer Paternal Grandmother     SOCIAL HISTORY:   Social History   Tobacco Use   Smoking status: Some Days    Current packs/day: 0.25    Types: Cigarettes   Smokeless tobacco: Never  Substance Use Topics   Alcohol use: Not Currently    Alcohol/week: 0.0 standard drinks of alcohol     ALLERGIES:   Allergies  Allergen Reactions   Darvon [Propoxyphene] Itching   Baclofen Other (See Comments)    confusion   Gabapentin     Felt like I was out of my head, slept all day, confusion    Sulfa Antibiotics Itching      CURRENT MEDICATIONS:   Current Outpatient Medications  Medication Sig Dispense Refill   allopurinol (ZYLOPRIM) 100 MG tablet Take 100 mg by mouth at bedtime.     amLODipine (NORVASC) 10 MG tablet Take 10 mg by mouth at bedtime.     ammonium lactate (LAC-HYDRIN) 12 % lotion APPLY EXTERNALLY TO AFFECTED SKIN AREAS AS NEEDED FOR ITCHING External Twice a day for 90 days     ethyl chloride spray Apply topically.     ferric citrate (AURYXIA) 1 GM 210 MG(Fe) tablet Take 420 mg by mouth 3 (three) times daily with meals.     furosemide (LASIX) 20 MG tablet Take 20 mg by mouth at bedtime.     hydrALAZINE (APRESOLINE) 25 MG tablet Take 1 tablet (25 mg total) by mouth 3 (three) times daily. 90 tablet 1   lidocaine (LMX) 4 % cream APPLY A SMALL AMOUNT TO LEFT ARM FISTULA 30 MINUTES BEFORE DIALYSIS External Three times a day for 30 days As needed     olmesartan (BENICAR) 20 MG tablet Take 20 mg by mouth daily.     oxyCODONE-acetaminophen (PERCOCET) 5-325 MG tablet Take 1 tablet by mouth every 4 (four) hours as needed for severe pain.  30 tablet 0   sodium bicarbonate 650 MG tablet Take by mouth.     tamsulosin (FLOMAX) 0.4 MG CAPS capsule Take 0.4 mg by mouth at bedtime.      No current facility-administered medications for this visit.    REVIEW OF SYSTEMS:   [X]  denotes positive finding, [ ]  denotes negative finding Cardiac  Comments:  Chest pain or chest pressure:    Shortness of breath upon exertion:    Short of breath when lying flat:    Irregular heart rhythm:        Vascular    Pain in calf, thigh, or hip brought on by ambulation:    Pain in feet at night that wakes you up from your sleep:     Blood clot in your veins:    Leg swelling:         Pulmonary    Oxygen at home:    Productive cough:     Wheezing:         Neurologic    Sudden weakness in arms or legs:     Sudden numbness in arms or legs:     Sudden onset of difficulty speaking or slurred speech:    Temporary loss  of vision in one eye:     Problems with dizziness:         Gastrointestinal    Blood in stool:     Vomited blood:         Genitourinary    Burning when urinating:     Blood in urine:        Psychiatric    Major depression:         Hematologic    Bleeding problems:    Problems with blood clotting too easily:        Skin    Rashes or ulcers:        Constitutional    Fever or chills:      PHYSICAL EXAM:   Vitals:   11/02/22 0836  BP: (!) 154/69  Pulse: 89  Temp: 97.9 F (36.6 C)  TempSrc: Temporal  SpO2: 95%  Weight: 211 lb (95.7 kg)  Height: 5\' 9"  (1.753 m)    GENERAL: The patient is a well-nourished male, in no acute distress. The vital signs are documented above. CARDIAC: There is a regular rate and rhythm.  VASCULAR: Pulsatile appearance to the left upper arm graft PULMONARY: Non-labored respirations MUSCULOSKELETAL: There are no major deformities or cyanosis. NEUROLOGIC: No focal weakness or paresthesias are detected. SKIN: There are no ulcers or rashes noted. PSYCHIATRIC: The patient has a normal affect.  STUDIES:   None  MEDICAL ISSUES:   End-stage renal disease: Patient is having difficulty with cannulation of his graft.  He does not endorse swelling or pain.  The graft is pulsatile on exam.  I have recommended proceeding with shuntogram to better evaluate his graft and intervene on any potential stenoses or issues.  This will be scheduled in the near future.  He is working on transportation.  I talked to him about using Benadryl ointment to help with the dryness of his skin as well as the itching.    Charlena Cross, MD, FACS [facility] [phone] Pager [phone]

## 2022-11-03 DIAGNOSIS — N186 End stage renal disease: Secondary | ICD-10-CM | POA: Diagnosis not present

## 2022-11-03 DIAGNOSIS — Z992 Dependence on renal dialysis: Secondary | ICD-10-CM | POA: Diagnosis not present

## 2022-11-03 DIAGNOSIS — N2581 Secondary hyperparathyroidism of renal origin: Secondary | ICD-10-CM | POA: Diagnosis not present

## 2022-11-03 DIAGNOSIS — I129 Hypertensive chronic kidney disease with stage 1 through stage 4 chronic kidney disease, or unspecified chronic kidney disease: Secondary | ICD-10-CM | POA: Diagnosis not present

## 2022-11-05 DIAGNOSIS — N186 End stage renal disease: Secondary | ICD-10-CM | POA: Diagnosis not present

## 2022-11-05 DIAGNOSIS — Z992 Dependence on renal dialysis: Secondary | ICD-10-CM | POA: Diagnosis not present

## 2022-11-07 ENCOUNTER — Other Ambulatory Visit: Payer: Self-pay

## 2022-11-07 DIAGNOSIS — N186 End stage renal disease: Secondary | ICD-10-CM

## 2022-11-07 MED ORDER — SODIUM CHLORIDE 0.9 % IV SOLN: 250.0000 mL | INTRAVENOUS | Status: AC | PRN

## 2022-11-07 MED ORDER — SODIUM CHLORIDE 0.9% FLUSH: 3.0000 mL | INTRAVENOUS | Status: DC | PRN

## 2022-11-07 MED ORDER — SODIUM CHLORIDE 0.9% FLUSH: 3.0000 mL | Freq: Two times a day (BID) | INTRAVENOUS | Status: DC

## 2022-11-08 DIAGNOSIS — N2581 Secondary hyperparathyroidism of renal origin: Secondary | ICD-10-CM | POA: Diagnosis not present

## 2022-11-08 DIAGNOSIS — N186 End stage renal disease: Secondary | ICD-10-CM | POA: Diagnosis not present

## 2022-11-08 DIAGNOSIS — Z992 Dependence on renal dialysis: Secondary | ICD-10-CM | POA: Diagnosis not present

## 2022-11-10 DIAGNOSIS — N186 End stage renal disease: Secondary | ICD-10-CM | POA: Diagnosis not present

## 2022-11-10 DIAGNOSIS — N2581 Secondary hyperparathyroidism of renal origin: Secondary | ICD-10-CM | POA: Diagnosis not present

## 2022-11-10 DIAGNOSIS — Z992 Dependence on renal dialysis: Secondary | ICD-10-CM | POA: Diagnosis not present

## 2022-11-12 DIAGNOSIS — N2581 Secondary hyperparathyroidism of renal origin: Secondary | ICD-10-CM | POA: Diagnosis not present

## 2022-11-12 DIAGNOSIS — N186 End stage renal disease: Secondary | ICD-10-CM | POA: Diagnosis not present

## 2022-11-12 DIAGNOSIS — Z992 Dependence on renal dialysis: Secondary | ICD-10-CM | POA: Diagnosis not present

## 2022-11-15 DIAGNOSIS — Z992 Dependence on renal dialysis: Secondary | ICD-10-CM | POA: Diagnosis not present

## 2022-11-15 DIAGNOSIS — N186 End stage renal disease: Secondary | ICD-10-CM | POA: Diagnosis not present

## 2022-11-15 DIAGNOSIS — N2581 Secondary hyperparathyroidism of renal origin: Secondary | ICD-10-CM | POA: Diagnosis not present

## 2022-11-17 DIAGNOSIS — N2581 Secondary hyperparathyroidism of renal origin: Secondary | ICD-10-CM | POA: Diagnosis not present

## 2022-11-17 DIAGNOSIS — Z992 Dependence on renal dialysis: Secondary | ICD-10-CM | POA: Diagnosis not present

## 2022-11-17 DIAGNOSIS — N186 End stage renal disease: Secondary | ICD-10-CM | POA: Diagnosis not present

## 2022-11-18 DIAGNOSIS — M542 Cervicalgia: Secondary | ICD-10-CM | POA: Diagnosis not present

## 2022-11-18 DIAGNOSIS — S98139A Complete traumatic amputation of one unspecified lesser toe, initial encounter: Secondary | ICD-10-CM | POA: Diagnosis not present

## 2022-11-18 DIAGNOSIS — R5383 Other fatigue: Secondary | ICD-10-CM | POA: Diagnosis not present

## 2022-11-18 DIAGNOSIS — G64 Other disorders of peripheral nervous system: Secondary | ICD-10-CM | POA: Diagnosis not present

## 2022-11-18 DIAGNOSIS — M109 Gout, unspecified: Secondary | ICD-10-CM | POA: Diagnosis not present

## 2022-11-18 DIAGNOSIS — I10 Essential (primary) hypertension: Secondary | ICD-10-CM | POA: Diagnosis not present

## 2022-11-18 DIAGNOSIS — E1129 Type 2 diabetes mellitus with other diabetic kidney complication: Secondary | ICD-10-CM | POA: Diagnosis not present

## 2022-11-18 DIAGNOSIS — E1142 Type 2 diabetes mellitus with diabetic polyneuropathy: Secondary | ICD-10-CM | POA: Diagnosis not present

## 2022-11-18 DIAGNOSIS — N186 End stage renal disease: Secondary | ICD-10-CM | POA: Diagnosis not present

## 2022-11-19 DIAGNOSIS — N2581 Secondary hyperparathyroidism of renal origin: Secondary | ICD-10-CM | POA: Diagnosis not present

## 2022-11-19 DIAGNOSIS — Z992 Dependence on renal dialysis: Secondary | ICD-10-CM | POA: Diagnosis not present

## 2022-11-19 DIAGNOSIS — N186 End stage renal disease: Secondary | ICD-10-CM | POA: Diagnosis not present

## 2022-11-22 ENCOUNTER — Encounter (HOSPITAL_COMMUNITY)
Admission: RE | Disposition: A | Discharge status: Home / Self Care | Payer: Self-pay | Source: Home / Self Care | Attending: Surgery

## 2022-11-22 ENCOUNTER — Encounter (HOSPITAL_COMMUNITY): Payer: Self-pay | Admitting: Surgery

## 2022-11-22 ENCOUNTER — Other Ambulatory Visit: Payer: Self-pay

## 2022-11-22 ENCOUNTER — Ambulatory Visit (HOSPITAL_COMMUNITY)
Admission: RE | Admit: 2022-11-22 | Discharge: 2022-11-22 | Disposition: A | Discharge status: Home / Self Care | Payer: Medicare HMO | Attending: Surgery | Admitting: Surgery

## 2022-11-22 DIAGNOSIS — Z86711 Personal history of pulmonary embolism: Secondary | ICD-10-CM | POA: Insufficient documentation

## 2022-11-22 DIAGNOSIS — F17210 Nicotine dependence, cigarettes, uncomplicated: Secondary | ICD-10-CM | POA: Insufficient documentation

## 2022-11-22 DIAGNOSIS — T82898A Other specified complication of vascular prosthetic devices, implants and grafts, initial encounter: Secondary | ICD-10-CM | POA: Diagnosis not present

## 2022-11-22 DIAGNOSIS — N186 End stage renal disease: Secondary | ICD-10-CM | POA: Diagnosis not present

## 2022-11-22 DIAGNOSIS — T82510A Breakdown (mechanical) of surgically created arteriovenous fistula, initial encounter: Secondary | ICD-10-CM | POA: Diagnosis not present

## 2022-11-22 DIAGNOSIS — Z992 Dependence on renal dialysis: Secondary | ICD-10-CM | POA: Diagnosis not present

## 2022-11-22 DIAGNOSIS — Z86718 Personal history of other venous thrombosis and embolism: Secondary | ICD-10-CM | POA: Diagnosis not present

## 2022-11-22 DIAGNOSIS — E1122 Type 2 diabetes mellitus with diabetic chronic kidney disease: Secondary | ICD-10-CM | POA: Insufficient documentation

## 2022-11-22 DIAGNOSIS — Y832 Surgical operation with anastomosis, bypass or graft as the cause of abnormal reaction of the patient, or of later complication, without mention of misadventure at the time of the procedure: Secondary | ICD-10-CM | POA: Insufficient documentation

## 2022-11-22 DIAGNOSIS — I120 Hypertensive chronic kidney disease with stage 5 chronic kidney disease or end stage renal disease: Secondary | ICD-10-CM | POA: Insufficient documentation

## 2022-11-22 HISTORY — PX: PERIPHERAL VASCULAR INTERVENTION: CATH118257

## 2022-11-22 HISTORY — PX: A/V FISTULAGRAM: CATH118298

## 2022-11-22 HISTORY — PX: PERIPHERAL VASCULAR BALLOON ANGIOPLASTY: CATH118281

## 2022-11-22 LAB — POCT I-STAT, CHEM 8
BUN: 48 mg/dL — ABNORMAL HIGH (ref 8–23)
Calcium, Ion: 1.01 mmol/L — ABNORMAL LOW (ref 1.15–1.40)
Chloride: 105 mmol/L (ref 98–111)
Creatinine, Ser: 13 mg/dL — ABNORMAL HIGH (ref 0.61–1.24)
Glucose, Bld: 85 mg/dL (ref 70–99)
HCT: 31 % — ABNORMAL LOW (ref 39.0–52.0)
Hemoglobin: 10.5 g/dL — ABNORMAL LOW (ref 13.0–17.0)
Potassium: 3.9 mmol/L (ref 3.5–5.1)
Sodium: 139 mmol/L (ref 135–145)
TCO2: 21 mmol/L — ABNORMAL LOW (ref 22–32)

## 2022-11-22 SURGERY — A/V FISTULAGRAM
Anesthesia: LOCAL | Laterality: Left

## 2022-11-22 MED ORDER — FENTANYL CITRATE (PF) 100 MCG/2ML IJ SOLN
INTRAMUSCULAR | Status: DC | PRN
  Administered 2022-11-22: 25 ug via INTRAVENOUS

## 2022-11-22 MED ORDER — IODIXANOL 320 MG/ML IV SOLN
INTRAVENOUS | Status: DC | PRN
  Administered 2022-11-22: 60 mL

## 2022-11-22 MED ORDER — FENTANYL CITRATE (PF) 100 MCG/2ML IJ SOLN
INTRAMUSCULAR | Status: AC
  Filled 2022-11-22: qty 2

## 2022-11-22 MED ORDER — MIDAZOLAM HCL 2 MG/2ML IJ SOLN
INTRAMUSCULAR | Status: AC
  Filled 2022-11-22: qty 2

## 2022-11-22 MED ORDER — MIDAZOLAM HCL 2 MG/2ML IJ SOLN
INTRAMUSCULAR | Status: DC | PRN
  Administered 2022-11-22: 1 mg via INTRAVENOUS

## 2022-11-22 MED ORDER — LIDOCAINE HCL (PF) 1 % IJ SOLN
INTRAMUSCULAR | Status: DC | PRN
  Administered 2022-11-22: 10 mL

## 2022-11-22 MED ORDER — HEPARIN (PORCINE) IN NACL 1000-0.9 UT/500ML-% IV SOLN
INTRAVENOUS | Status: DC | PRN
  Administered 2022-11-22: 500 mL

## 2022-11-22 MED ORDER — LIDOCAINE HCL (PF) 1 % IJ SOLN
INTRAMUSCULAR | Status: AC
  Filled 2022-11-22: qty 30

## 2022-11-22 SURGICAL SUPPLY — 22 items
BALLN MUSTANG 10.0X40 75 (BALLOONS) ×1
BALLN MUSTANG 7.0X40 75 (BALLOONS) ×1
BALLOON MUSTANG 10.0X40 75 (BALLOONS) IMPLANT
BALLOON MUSTANG 7.0X40 75 (BALLOONS) IMPLANT
COVER DOME SNAP 22 D (MISCELLANEOUS) ×1 IMPLANT
HEMOSTAT KELLY 5.5 SS STRL (MISCELLANEOUS) IMPLANT
KIT ENCORE 26 ADVANTAGE (KITS) IMPLANT
KIT MICROPUNCTURE NIT STIFF (SHEATH) IMPLANT
SET ATX-X65L (MISCELLANEOUS) IMPLANT
SHEATH BRITE TIP 8FR 35CM (SHEATH) IMPLANT
SHEATH PINNACLE 8F 10CM (SHEATH) IMPLANT
SHEATH PINNACLE R/O II 7F 4CM (SHEATH) IMPLANT
SHEATH PROBE COVER 6X72 (BAG) ×1 IMPLANT
STENT VIABAHN 10X5X120 (Permanent Stent) IMPLANT
STENT VIABAHN 8X100X120 (Permanent Stent) ×1 IMPLANT
STENT VIABAHN 8X10X120 (Permanent Stent) IMPLANT
STENT VIABAHN 8X7.5X120 (Permanent Stent) IMPLANT
STENT VIABAHN10X120X8X (Permanent Stent) ×1 IMPLANT
TRAY PV CATH (CUSTOM PROCEDURE TRAY) ×1 IMPLANT
TUBING CIL FLEX 10 FLL-RA (TUBING) ×1 IMPLANT
WIRE G V18X300CM (WIRE) IMPLANT
WIRE STARTER BENTSON 035X150 (WIRE) IMPLANT

## 2022-11-22 NOTE — Op Note (Signed)
    Patient name: Duane Ortiz MRN: [id number] DOB: 02-28-1950 Sex: male  11/22/2022 Pre-operative Diagnosis: Poorly functioning left upper arm dialysis graft Post-operative diagnosis:  Same Surgeon:  Durene Cal Procedure Performed:  1.  Ultrasound-guided access, left upper arm graft  2.  Shuntogram  3.  Stent, left arm dialysis graft  4.  Conscious sedation, 45 min   Indications: The patient is having difficulty with his graft.  He comes in today for shuntogram  Procedure:  The patient was identified in the holding area and taken to room 8.  The patient was then placed supine on the table and prepped and draped in the usual sterile fashion.  A time out was called.  Conscious sedation was administered with the use of IV fentanyl and Versed under continuous physician and nurse monitoring.  Heart rate, blood pressure, and oxygen saturations were continuously monitored.  Total sedation time was 45 minutes ultrasound was used to evaluate the fistula.  The vein was patent and compressible.  A digital ultrasound image was acquired.  The fistula was then accessed under ultrasound guidance using a micropuncture needle.  An 018 wire was then asvanced without resistance and a micropuncture sheath was placed.  Contrast injections were then performed through the sheath.  Findings: The central venous system is widely patent.  There is slight narrowing of the subclavian vein under the clavicle however on additional imaging this did not appear to be hemodynamically significant.  There is slight narrowing of the venous outflow tract.  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  The arterial venous anastomosis is widely patent.   Intervention: After the above images were acquired the decision was made to proceed with intervention.  I initially placed a 7 [place] sheath.  I elected to stent the pseudoaneurysm with a 8 x 7.5 Viabahn.  The Viabahn was deployed however upon inserting the  balloon, the Viabahn withdrew.  I inflated the balloon and then felt I needed to extend with a second 8 x 5.0 Viabahn.  This was deployed and molded with a 10 mm balloon.  Completion imaging showed that the 8 mm graft was not large enough for the ectatic dialysis graft.  I then upsized the sheath to an 8 French sheath and then placed a 10 x 50 Viabahn which was molded with 10 mm balloon.  Completion imaging showed resolution of the pseudoaneurysm and widely patent left upper arm dialysis graft.  The sheath was removed with a Monocryl closure.  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  This was treated with overlapping 8 and 10 Viabahn stents   V. Durene Cal, M.D., [facility] [place] Office: [phone] Pager:  [phone]

## 2022-11-22 NOTE — Interval H&P Note (Signed)
History and Physical Interval Note:  11/22/2022 7:39 AM  Duane Ortiz  has presented today for surgery, with the diagnosis of instage renal.  The various methods of treatment have been discussed with the patient and family. After consideration of risks, benefits and other options for treatment, the patient has consented to  Procedure(s): A/V Fistulagram (Left) as a surgical intervention.  The patient's history has been reviewed, patient examined, no change in status, stable for surgery.  I have reviewed the patient's chart and labs.  Questions were answered to the patient's satisfaction.     Durene Cal

## 2022-11-24 ENCOUNTER — Encounter (HOSPITAL_COMMUNITY): Payer: Self-pay | Admitting: Surgery

## 2022-11-24 ENCOUNTER — Telehealth: Payer: Self-pay

## 2022-11-24 DIAGNOSIS — N186 End stage renal disease: Secondary | ICD-10-CM | POA: Diagnosis not present

## 2022-11-24 DIAGNOSIS — N2581 Secondary hyperparathyroidism of renal origin: Secondary | ICD-10-CM | POA: Diagnosis not present

## 2022-11-24 DIAGNOSIS — Z992 Dependence on renal dialysis: Secondary | ICD-10-CM | POA: Diagnosis not present

## 2022-11-24 NOTE — Telephone Encounter (Signed)
Nurse from [facility] called to make sure pt's suture was dissolvable, as they were unable to remove it. Advised to let it absorb as closure suture was an absorbable one. No further question/concerns this time.

## 2022-11-26 DIAGNOSIS — N186 End stage renal disease: Secondary | ICD-10-CM | POA: Diagnosis not present

## 2022-11-26 DIAGNOSIS — Z992 Dependence on renal dialysis: Secondary | ICD-10-CM | POA: Diagnosis not present

## 2022-11-26 DIAGNOSIS — N2581 Secondary hyperparathyroidism of renal origin: Secondary | ICD-10-CM | POA: Diagnosis not present

## 2022-11-28 DIAGNOSIS — N186 End stage renal disease: Secondary | ICD-10-CM | POA: Diagnosis not present

## 2022-11-28 DIAGNOSIS — N2581 Secondary hyperparathyroidism of renal origin: Secondary | ICD-10-CM | POA: Diagnosis not present

## 2022-11-28 DIAGNOSIS — Z992 Dependence on renal dialysis: Secondary | ICD-10-CM | POA: Diagnosis not present

## 2022-11-30 ENCOUNTER — Ambulatory Visit (HOSPITAL_COMMUNITY): Payer: Medicare HMO | Attending: Cardiology

## 2022-11-30 ENCOUNTER — Encounter (HOSPITAL_COMMUNITY): Payer: Self-pay | Admitting: Cardiology

## 2022-11-30 DIAGNOSIS — N186 End stage renal disease: Secondary | ICD-10-CM | POA: Diagnosis not present

## 2022-11-30 DIAGNOSIS — Z992 Dependence on renal dialysis: Secondary | ICD-10-CM | POA: Diagnosis not present

## 2022-11-30 DIAGNOSIS — N2581 Secondary hyperparathyroidism of renal origin: Secondary | ICD-10-CM | POA: Diagnosis not present

## 2022-12-03 DIAGNOSIS — Z992 Dependence on renal dialysis: Secondary | ICD-10-CM | POA: Diagnosis not present

## 2022-12-03 DIAGNOSIS — I129 Hypertensive chronic kidney disease with stage 1 through stage 4 chronic kidney disease, or unspecified chronic kidney disease: Secondary | ICD-10-CM | POA: Diagnosis not present

## 2022-12-03 DIAGNOSIS — N186 End stage renal disease: Secondary | ICD-10-CM | POA: Diagnosis not present

## 2022-12-03 DIAGNOSIS — N2581 Secondary hyperparathyroidism of renal origin: Secondary | ICD-10-CM | POA: Diagnosis not present

## 2022-12-08 DIAGNOSIS — N2581 Secondary hyperparathyroidism of renal origin: Secondary | ICD-10-CM | POA: Diagnosis not present

## 2022-12-08 DIAGNOSIS — Z992 Dependence on renal dialysis: Secondary | ICD-10-CM | POA: Diagnosis not present

## 2022-12-08 DIAGNOSIS — N186 End stage renal disease: Secondary | ICD-10-CM | POA: Diagnosis not present

## 2022-12-13 DIAGNOSIS — N2581 Secondary hyperparathyroidism of renal origin: Secondary | ICD-10-CM | POA: Diagnosis not present

## 2022-12-13 DIAGNOSIS — Z992 Dependence on renal dialysis: Secondary | ICD-10-CM | POA: Diagnosis not present

## 2022-12-13 DIAGNOSIS — N186 End stage renal disease: Secondary | ICD-10-CM | POA: Diagnosis not present

## 2022-12-14 DIAGNOSIS — M542 Cervicalgia: Secondary | ICD-10-CM | POA: Diagnosis not present

## 2022-12-15 DIAGNOSIS — Z992 Dependence on renal dialysis: Secondary | ICD-10-CM | POA: Diagnosis not present

## 2022-12-15 DIAGNOSIS — N186 End stage renal disease: Secondary | ICD-10-CM | POA: Diagnosis not present

## 2022-12-15 DIAGNOSIS — N2581 Secondary hyperparathyroidism of renal origin: Secondary | ICD-10-CM | POA: Diagnosis not present

## 2022-12-17 DIAGNOSIS — N2581 Secondary hyperparathyroidism of renal origin: Secondary | ICD-10-CM | POA: Diagnosis not present

## 2022-12-17 DIAGNOSIS — N186 End stage renal disease: Secondary | ICD-10-CM | POA: Diagnosis not present

## 2022-12-17 DIAGNOSIS — Z992 Dependence on renal dialysis: Secondary | ICD-10-CM | POA: Diagnosis not present

## 2022-12-20 DIAGNOSIS — Z992 Dependence on renal dialysis: Secondary | ICD-10-CM | POA: Diagnosis not present

## 2022-12-20 DIAGNOSIS — N2581 Secondary hyperparathyroidism of renal origin: Secondary | ICD-10-CM | POA: Diagnosis not present

## 2022-12-20 DIAGNOSIS — N186 End stage renal disease: Secondary | ICD-10-CM | POA: Diagnosis not present

## 2022-12-22 DIAGNOSIS — N2581 Secondary hyperparathyroidism of renal origin: Secondary | ICD-10-CM | POA: Diagnosis not present

## 2022-12-22 DIAGNOSIS — N186 End stage renal disease: Secondary | ICD-10-CM | POA: Diagnosis not present

## 2022-12-22 DIAGNOSIS — Z992 Dependence on renal dialysis: Secondary | ICD-10-CM | POA: Diagnosis not present

## 2022-12-24 DIAGNOSIS — Z992 Dependence on renal dialysis: Secondary | ICD-10-CM | POA: Diagnosis not present

## 2022-12-24 DIAGNOSIS — N186 End stage renal disease: Secondary | ICD-10-CM | POA: Diagnosis not present

## 2022-12-24 DIAGNOSIS — N2581 Secondary hyperparathyroidism of renal origin: Secondary | ICD-10-CM | POA: Diagnosis not present

## 2022-12-26 DIAGNOSIS — N186 End stage renal disease: Secondary | ICD-10-CM | POA: Diagnosis not present

## 2022-12-26 DIAGNOSIS — N2581 Secondary hyperparathyroidism of renal origin: Secondary | ICD-10-CM | POA: Diagnosis not present

## 2022-12-26 DIAGNOSIS — Z992 Dependence on renal dialysis: Secondary | ICD-10-CM | POA: Diagnosis not present

## 2022-12-29 DIAGNOSIS — N186 End stage renal disease: Secondary | ICD-10-CM | POA: Diagnosis not present

## 2022-12-29 DIAGNOSIS — Z992 Dependence on renal dialysis: Secondary | ICD-10-CM | POA: Diagnosis not present

## 2022-12-31 DIAGNOSIS — Z992 Dependence on renal dialysis: Secondary | ICD-10-CM | POA: Diagnosis not present

## 2022-12-31 DIAGNOSIS — N186 End stage renal disease: Secondary | ICD-10-CM | POA: Diagnosis not present

## 2023-01-02 DIAGNOSIS — M542 Cervicalgia: Secondary | ICD-10-CM | POA: Diagnosis not present

## 2023-01-03 DIAGNOSIS — Z992 Dependence on renal dialysis: Secondary | ICD-10-CM | POA: Diagnosis not present

## 2023-01-03 DIAGNOSIS — I129 Hypertensive chronic kidney disease with stage 1 through stage 4 chronic kidney disease, or unspecified chronic kidney disease: Secondary | ICD-10-CM | POA: Diagnosis not present

## 2023-01-03 DIAGNOSIS — N186 End stage renal disease: Secondary | ICD-10-CM | POA: Diagnosis not present

## 2023-01-24 ENCOUNTER — Telehealth: Payer: Self-pay

## 2023-01-24 NOTE — Telephone Encounter (Signed)
Call to both patient's # listed in epic on dpr as well as son's number. Neither line has VM set up.

## 2023-01-24 NOTE — Telephone Encounter (Signed)
**Note De-Identified Duane Ortiz Obfuscation** Per the [employer] provider Portal: The pts coverage ended on 01/03/2023.  I called the pt but got no answer and no way to leave a message as his VM is full.

## 2023-01-26 NOTE — Telephone Encounter (Signed)
Letter sent asking patient to call our office with updated insurance information.

## 2023-03-14 ENCOUNTER — Inpatient Hospital Stay (HOSPITAL_COMMUNITY)
Admission: EM | Admit: 2023-03-14 | Discharge: 2023-03-22 | DRG: 640 | Disposition: A | Discharge status: Skilled Nursing Facility | Attending: Internal Medicine | Admitting: Internal Medicine

## 2023-03-14 ENCOUNTER — Inpatient Hospital Stay (HOSPITAL_COMMUNITY)

## 2023-03-14 ENCOUNTER — Encounter (HOSPITAL_COMMUNITY): Payer: Self-pay | Admitting: Internal Medicine

## 2023-03-14 ENCOUNTER — Emergency Department (HOSPITAL_COMMUNITY)

## 2023-03-14 ENCOUNTER — Other Ambulatory Visit: Payer: Self-pay

## 2023-03-14 DIAGNOSIS — K219 Gastro-esophageal reflux disease without esophagitis: Secondary | ICD-10-CM | POA: Diagnosis present

## 2023-03-14 DIAGNOSIS — W19XXXD Unspecified fall, subsequent encounter: Secondary | ICD-10-CM

## 2023-03-14 DIAGNOSIS — F1011 Alcohol abuse, in remission: Secondary | ICD-10-CM | POA: Diagnosis present

## 2023-03-14 DIAGNOSIS — F17210 Nicotine dependence, cigarettes, uncomplicated: Secondary | ICD-10-CM | POA: Diagnosis present

## 2023-03-14 DIAGNOSIS — Z992 Dependence on renal dialysis: Secondary | ICD-10-CM

## 2023-03-14 DIAGNOSIS — Z86711 Personal history of pulmonary embolism: Secondary | ICD-10-CM

## 2023-03-14 DIAGNOSIS — E7800 Pure hypercholesterolemia, unspecified: Secondary | ICD-10-CM | POA: Diagnosis present

## 2023-03-14 DIAGNOSIS — Z888 Allergy status to other drugs, medicaments and biological substances status: Secondary | ICD-10-CM | POA: Diagnosis not present

## 2023-03-14 DIAGNOSIS — Z89412 Acquired absence of left great toe: Secondary | ICD-10-CM | POA: Diagnosis not present

## 2023-03-14 DIAGNOSIS — E8770 Fluid overload, unspecified: Principal | ICD-10-CM | POA: Diagnosis present

## 2023-03-14 DIAGNOSIS — N2581 Secondary hyperparathyroidism of renal origin: Secondary | ICD-10-CM | POA: Diagnosis present

## 2023-03-14 DIAGNOSIS — Z751 Person awaiting admission to adequate facility elsewhere: Secondary | ICD-10-CM

## 2023-03-14 DIAGNOSIS — R531 Weakness: Secondary | ICD-10-CM | POA: Diagnosis present

## 2023-03-14 DIAGNOSIS — I10 Essential (primary) hypertension: Secondary | ICD-10-CM | POA: Diagnosis present

## 2023-03-14 DIAGNOSIS — E1122 Type 2 diabetes mellitus with diabetic chronic kidney disease: Secondary | ICD-10-CM | POA: Diagnosis present

## 2023-03-14 DIAGNOSIS — Z8249 Family history of ischemic heart disease and other diseases of the circulatory system: Secondary | ICD-10-CM | POA: Diagnosis not present

## 2023-03-14 DIAGNOSIS — M109 Gout, unspecified: Secondary | ICD-10-CM | POA: Diagnosis present

## 2023-03-14 DIAGNOSIS — E1129 Type 2 diabetes mellitus with other diabetic kidney complication: Secondary | ICD-10-CM | POA: Diagnosis present

## 2023-03-14 DIAGNOSIS — I120 Hypertensive chronic kidney disease with stage 5 chronic kidney disease or end stage renal disease: Secondary | ICD-10-CM | POA: Diagnosis present

## 2023-03-14 DIAGNOSIS — Z803 Family history of malignant neoplasm of breast: Secondary | ICD-10-CM

## 2023-03-14 DIAGNOSIS — M4686 Other specified inflammatory spondylopathies, lumbar region: Secondary | ICD-10-CM | POA: Diagnosis present

## 2023-03-14 DIAGNOSIS — D631 Anemia in chronic kidney disease: Secondary | ICD-10-CM | POA: Diagnosis present

## 2023-03-14 DIAGNOSIS — W19XXXA Unspecified fall, initial encounter: Secondary | ICD-10-CM | POA: Diagnosis present

## 2023-03-14 DIAGNOSIS — F1010 Alcohol abuse, uncomplicated: Secondary | ICD-10-CM | POA: Diagnosis present

## 2023-03-14 DIAGNOSIS — Z79899 Other long term (current) drug therapy: Secondary | ICD-10-CM

## 2023-03-14 DIAGNOSIS — Z789 Other specified health status: Secondary | ICD-10-CM | POA: Diagnosis present

## 2023-03-14 DIAGNOSIS — Z91158 Patient's noncompliance with renal dialysis for other reason: Secondary | ICD-10-CM

## 2023-03-14 DIAGNOSIS — N400 Enlarged prostate without lower urinary tract symptoms: Secondary | ICD-10-CM | POA: Diagnosis present

## 2023-03-14 DIAGNOSIS — Z882 Allergy status to sulfonamides status: Secondary | ICD-10-CM

## 2023-03-14 DIAGNOSIS — Z833 Family history of diabetes mellitus: Secondary | ICD-10-CM

## 2023-03-14 DIAGNOSIS — N186 End stage renal disease: Principal | ICD-10-CM | POA: Diagnosis present

## 2023-03-14 DIAGNOSIS — M545 Low back pain, unspecified: Principal | ICD-10-CM | POA: Diagnosis present

## 2023-03-14 LAB — COMPREHENSIVE METABOLIC PANEL
ALT: 90 U/L — ABNORMAL HIGH (ref 0–44)
AST: 58 U/L — ABNORMAL HIGH (ref 15–41)
Albumin: 3 g/dL — ABNORMAL LOW (ref 3.5–5.0)
Alkaline Phosphatase: 78 U/L (ref 38–126)
Anion gap: 25 — ABNORMAL HIGH (ref 5–15)
BUN: 107 mg/dL — ABNORMAL HIGH (ref 8–23)
CO2: 17 mmol/L — ABNORMAL LOW (ref 22–32)
Calcium: 8.5 mg/dL — ABNORMAL LOW (ref 8.9–10.3)
Chloride: 98 mmol/L (ref 98–111)
Creatinine, Ser: 19.84 mg/dL — ABNORMAL HIGH (ref 0.61–1.24)
GFR, Estimated: 2 mL/min — ABNORMAL LOW (ref >60)
Glucose, Bld: 82 mg/dL (ref 70–99)
Potassium: 4.6 mmol/L (ref 3.5–5.1)
Sodium: 140 mmol/L (ref 135–145)
Total Bilirubin: 1.3 mg/dL — ABNORMAL HIGH (ref 0.0–1.2)
Total Protein: 6.5 g/dL (ref 6.5–8.1)

## 2023-03-14 LAB — CBC WITH DIFFERENTIAL/PLATELET
Abs Immature Granulocytes: 0.04 10*3/uL (ref 0.00–0.07)
Basophils Absolute: 0 10*3/uL (ref 0.0–0.1)
Basophils Relative: 0 %
Eosinophils Absolute: 0.1 10*3/uL (ref 0.0–0.5)
Eosinophils Relative: 1 %
HCT: 42.4 % (ref 39.0–52.0)
Hemoglobin: 13.5 g/dL (ref 13.0–17.0)
Immature Granulocytes: 1 %
Lymphocytes Relative: 12 %
Lymphs Abs: 1 10*3/uL (ref 0.7–4.0)
MCH: 28.2 pg (ref 26.0–34.0)
MCHC: 31.8 g/dL (ref 30.0–36.0)
MCV: 88.5 fL (ref 80.0–100.0)
Monocytes Absolute: 0.8 10*3/uL (ref 0.1–1.0)
Monocytes Relative: 9 %
Neutro Abs: 6.3 10*3/uL (ref 1.7–7.7)
Neutrophils Relative %: 77 %
Platelets: 185 10*3/uL (ref 150–400)
RBC: 4.79 MIL/uL (ref 4.22–5.81)
RDW: 19 % — ABNORMAL HIGH (ref 11.5–15.5)
WBC: 8.2 10*3/uL (ref 4.0–10.5)
nRBC: 0.2 % (ref 0.0–0.2)

## 2023-03-14 LAB — PHOSPHORUS: Phosphorus: >30 mg/dL — ABNORMAL HIGH (ref 2.5–4.6)

## 2023-03-14 LAB — HEPATITIS B SURFACE ANTIGEN: Hepatitis B Surface Ag: NONREACTIVE

## 2023-03-14 MED ORDER — CHLORHEXIDINE GLUCONATE CLOTH 2 % EX PADS
6.0000 | MEDICATED_PAD | Freq: Every day | CUTANEOUS | Status: DC
Start: 2023-03-15 — End: 2023-03-17

## 2023-03-14 MED ORDER — HYDRALAZINE HCL 25 MG PO TABS
25.0000 mg | ORAL_TABLET | Freq: Every day | ORAL | Status: DC
  Administered 2023-03-14: 25 mg via ORAL
  Filled 2023-03-14: qty 1

## 2023-03-14 MED ORDER — HYDRALAZINE HCL 20 MG/ML IJ SOLN
10.0000 mg | INTRAMUSCULAR | Status: DC | PRN
  Administered 2023-03-14: 10 mg via INTRAVENOUS
  Filled 2023-03-14: qty 1

## 2023-03-14 MED ORDER — MORPHINE SULFATE (PF) 2 MG/ML IV SOLN
2.0000 mg | INTRAVENOUS | Status: DC | PRN
  Administered 2023-03-14: 2 mg via INTRAVENOUS
  Filled 2023-03-14: qty 1

## 2023-03-14 MED ORDER — NICOTINE 21 MG/24HR TD PT24
21.0000 mg | MEDICATED_PATCH | Freq: Every day | TRANSDERMAL | Status: DC
  Administered 2023-03-14 – 2023-03-22 (×5): 21 mg via TRANSDERMAL
  Filled 2023-03-14 (×8): qty 1

## 2023-03-14 MED ORDER — NEBIVOLOL HCL 10 MG PO TABS
10.0000 mg | ORAL_TABLET | Freq: Every day | ORAL | Status: DC
Start: 2023-03-14 — End: 2023-03-23
  Administered 2023-03-16 – 2023-03-21 (×7): 10 mg via ORAL
  Filled 2023-03-14 (×9): qty 1

## 2023-03-14 MED ORDER — HYDROCODONE-ACETAMINOPHEN 5-325 MG PO TABS
1.0000 | ORAL_TABLET | Freq: Once | ORAL | Status: AC
  Administered 2023-03-14: 1 via ORAL
  Filled 2023-03-14: qty 1

## 2023-03-14 MED ORDER — CINACALCET HCL 30 MG PO TABS
30.0000 mg | ORAL_TABLET | ORAL | Status: DC
  Administered 2023-03-14 – 2023-03-21 (×3): 30 mg via ORAL
  Filled 2023-03-14 (×3): qty 1

## 2023-03-14 MED ORDER — TAMSULOSIN HCL 0.4 MG PO CAPS
0.4000 mg | ORAL_CAPSULE | Freq: Every day | ORAL | Status: DC
  Administered 2023-03-14 – 2023-03-21 (×8): 0.4 mg via ORAL
  Filled 2023-03-14 (×8): qty 1

## 2023-03-14 MED ORDER — IRBESARTAN 75 MG PO TABS
150.0000 mg | ORAL_TABLET | Freq: Every day | ORAL | Status: DC
  Administered 2023-03-14 – 2023-03-22 (×8): 150 mg via ORAL
  Filled 2023-03-14: qty 2
  Filled 2023-03-14: qty 1
  Filled 2023-03-14 (×6): qty 2

## 2023-03-14 MED ORDER — FERRIC CITRATE 1 GM 210 MG(FE) PO TABS
420.0000 mg | ORAL_TABLET | Freq: Three times a day (TID) | ORAL | Status: DC
  Administered 2023-03-14 – 2023-03-19 (×11): 420 mg via ORAL
  Filled 2023-03-14 (×11): qty 2

## 2023-03-14 MED ORDER — CLONIDINE HCL 0.1 MG PO TABS
0.1000 mg | ORAL_TABLET | Freq: Two times a day (BID) | ORAL | Status: DC
  Administered 2023-03-14 – 2023-03-15 (×2): 0.1 mg via ORAL
  Filled 2023-03-14 (×2): qty 1

## 2023-03-14 MED ORDER — PANTOPRAZOLE SODIUM 40 MG IV SOLR
40.0000 mg | INTRAVENOUS | Status: DC
  Administered 2023-03-14: 40 mg via INTRAVENOUS
  Filled 2023-03-14: qty 10

## 2023-03-14 MED ORDER — ALLOPURINOL 100 MG PO TABS
100.0000 mg | ORAL_TABLET | Freq: Every day | ORAL | Status: DC
  Administered 2023-03-14 – 2023-03-21 (×8): 100 mg via ORAL
  Filled 2023-03-14 (×8): qty 1

## 2023-03-14 MED ORDER — CALCITRIOL 0.5 MCG PO CAPS
1.0000 ug | ORAL_CAPSULE | ORAL | Status: DC
  Administered 2023-03-14 – 2023-03-21 (×3): 1 ug via ORAL
  Filled 2023-03-14 (×3): qty 2

## 2023-03-14 MED ORDER — SODIUM CHLORIDE 0.9% FLUSH
3.0000 mL | Freq: Two times a day (BID) | INTRAVENOUS | Status: DC
  Administered 2023-03-14 – 2023-03-22 (×15): 3 mL via INTRAVENOUS

## 2023-03-14 NOTE — Consult Note (Signed)
 ESRD Consult Note  Requesting provider: Kristine Royal Service requesting consult: ER Reason for consult: ESRD, provision of dialysis Indication for acute dialysis?: End Stage Renal Disease  Outpatient dialysis unit: [facility] Outpatient dialysis prescription: Had treatment on 3/4, missed 3/8, 3/6, 3/1 F180, BFR 800, 2K, 2.5 Ca, 4hrs, EDW 89.5, LUE AVG, no esa, calcitriol w/ treatments, sensipar 30mg  TTS, auryxia 2 tabs w/ meals  Assessment/Recommendations: Duane Ortiz is a/an 73 y.o. male with a past medical history notable for ESRD on HD admitted with back pain   # ESRD: Noncompliant and missed multiple treatments over the last few days.  Will plan on dialysis tonight or tomorrow morning pending nursing availability.  Will try to get patient back on his regular schedule.  # Volume/ hypertension: Appears volume overloaded.  Attempt to achieve dry weight is stable.  Continue home blood pressure medications.  # Anemia of Chronic Kidney Disease: Hemoglobin acceptable without ESA  # Secondary Hyperparathyroidism/Hyperphosphatemia: Continue home Sensipar and calcitriol as well as home binders.  Obtain phosphorus level  # Vascular access: AVG with no issues  # Back pain: related to fall. Mgmt per primary  # Additional recommendations: - Dose all meds for creatinine clearance < 10 ml/min  - Unless absolutely necessary, no MRIs with gadolinium.  - Implement save arm precautions.  Prefer needle sticks in the dorsum of the hands or wrists.  No blood pressure measurements in arm. - If blood transfusion is requested during hemodialysis sessions, please alert [place] prior to the session.  - Use synthetic opioids (Fentanyl/Dilaudid) if needed  Recommendations were discussed with the primary team.   History of Present Illness: Duane Ortiz is a/an 73 y.o. male with a past medical history of ESRD who presents with back pain.  Patient states that he fell last week trying to get into his car and  has had significant back pain since then.  This back pain has been severe enough where it is made it difficulty to ambulate.  He has not gone to dialysis on 3/8 and 3/6.  His last dialysis was on 3/4 and he missed on 3/1.  He states that they do not take care of him very well at dialysis but is unable to express exactly what he means by that.  States he tries to go to dialysis regularly.  He denies fevers, chills, shortness of breath, chest pain, nausea, vomiting, diarrhea.  In the emergency department his creatinine was 19.8 and his BUN was 107.  Hemoglobin is acceptable.   Medications:  Current Facility-Administered Medications  Medication Dose Route Frequency Provider Last Rate Last Admin   calcitRIOL (ROCALTROL) capsule 1 mcg  1 mcg Oral Q T,Th,Sat-1800 Darnell Level, MD       [START ON 03/15/2023] Chlorhexidine Gluconate Cloth 2 % PADS 6 each  6 each Topical Q0600 Darnell Level, MD       cinacalcet (SENSIPAR) tablet 30 mg  30 mg Oral Q T,Th,Sat-1800 Darnell Level, MD       ferric citrate (AURYXIA) tablet 420 mg  420 mg Oral TID WC Darnell Level, MD       sodium chloride flush (NS) 0.9 % injection 3 mL  3 mL Intravenous Q12H Nada Libman, MD       sodium chloride flush (NS) 0.9 % injection 3 mL  3 mL Intravenous PRN Nada Libman, MD       Current Outpatient Medications  Medication Sig Dispense Refill   allopurinol (ZYLOPRIM) 100 MG  tablet Take 100 mg by mouth at bedtime.     amLODipine (NORVASC) 10 MG tablet Take 10 mg by mouth at bedtime.     ammonium lactate (LAC-HYDRIN) 12 % lotion Apply 1 Application topically as needed (itching.).     cinacalcet (SENSIPAR) 30 MG tablet Take 30 mg by mouth 3 (three) times a week.     cloNIDine (CATAPRES) 0.1 MG tablet Take 0.1 mg by mouth 2 (two) times daily.     cyclobenzaprine (FLEXERIL) 5 MG tablet Take 5 mg by mouth daily as needed for muscle spasms.     ferric citrate (AURYXIA) 1 GM 210 MG(Fe) tablet Take 210 mg by mouth 2  (two) times daily with a meal.     hydrALAZINE (APRESOLINE) 25 MG tablet Take 1 tablet (25 mg total) by mouth 3 (three) times daily. (Patient taking differently: Take 25 mg by mouth at bedtime.) 90 tablet 1   lidocaine-prilocaine (EMLA) cream Apply 1 Application topically 3 (three) times a week. Before accessing with needle     nebivolol (BYSTOLIC) 10 MG tablet Take 10 mg by mouth at bedtime.     olmesartan (BENICAR) 20 MG tablet Take 20 mg by mouth at bedtime.     tamsulosin (FLOMAX) 0.4 MG CAPS capsule Take 0.4 mg by mouth at bedtime.        ALLERGIES Darvon [propoxyphene], Baclofen, Gabapentin, and Sulfa antibiotics  MEDICAL HISTORY Past Medical History:  Diagnosis Date   Acute encephalopathy 09/30/2019   AMS (altered mental status) 10/01/2019   Arthritis    Chronic kidney disease    Chronic kidney disease    Diabetes (HCC) 02/012017   Fainting    Gait abnormality 10/11/2016   GERD (gastroesophageal reflux disease)    Gout    Headache    Hypercholesteremia    Hypercholesterolemia    Hypertension    Peripheral neuropathy 04/02/2019   Pulmonary embolism (HCC)    Shortness of breath    Sleep apnea    does wear cpap   Wears dentures      SOCIAL HISTORY Social History   Socioeconomic History   Marital status: Divorced    Spouse name: Not on file   Number of children: 3   Years of education: 10   Highest education level: Not on file  Occupational History   Not on file  Tobacco Use   Smoking status: Some Days    Current packs/day: 0.25    Types: Cigarettes   Smokeless tobacco: Never  Vaping Use   Vaping status: Never Used  Substance and Sexual Activity   Alcohol use: Not Currently    Alcohol/week: 0.0 standard drinks of alcohol   Drug use: No   Sexual activity: Not on file  Other Topics Concern   Not on file  Social History Narrative   Lives alone   Caffeine use: Sometimes   Right handed    Dialysis T-Th-Sat   Social Drivers of Health   Financial  Resource Strain: Not on file  Food Insecurity: Not on file  Transportation Needs: Not on file  Physical Activity: Not on file  Stress: Not on file  Social Connections: Unknown (05/14/2021)   Received from [facility]   Social Network    Social Network: Not on file  Intimate Partner Violence: Unknown (04/05/2021)   Received from [facility], [facility]   HITS    Physically Hurt: Not on file    Insult or Talk Down To: Not on file  Threaten Physical Harm: Not on file    Scream or Curse: Not on file     FAMILY HISTORY Family History  Problem Relation Age of Onset   Diabetes Mother    Heart disease Mother    Hypertension Mother    Diabetes Sister    Hypertension Sister    Diabetes Brother    Hypertension Brother    Breast cancer Paternal Grandmother      Review of Systems: 12 systems were reviewed and negative except per HPI  Physical Exam: Vitals:   03/14/23 1027 03/14/23 1300  BP: (!) 196/68 (!) 148/79  Pulse: (!) 59 78  Resp: 16 18  Temp: (!) 97.5 F (36.4 C) 98.1 F (36.7 C)  SpO2: 100% 99%   No intake/output data recorded. No intake or output data in the 24 hours ending 03/14/23 1553 General: well-appearing, no acute distress HEENT: anicteric sclera, MMM CV: normal rate, no murmurs, 1+ pitting edema in the bilateral lower extremities Lungs: bilateral chest rise, normal wob Abd: soft, non-tender, non-distended Skin: Bilateral cracking of the lower extremities with venous stasis changes, otherwise no visible lesions or rashes Psych: alert, engaged, appropriate mood and affect Neuro: normal speech, no gross focal deficits   Test Results Reviewed Lab Results  Component Value Date   NA 140 03/14/2023   K 4.6 03/14/2023   CL 98 03/14/2023   CO2 17 (L) 03/14/2023   BUN 107 (H) 03/14/2023   CREATININE 19.84 (H) 03/14/2023   CALCIUM 8.5 (L) 03/14/2023   ALBUMIN 3.0 (L) 03/14/2023   PHOS 5.8 (H) 10/28/2019    I have reviewed relevant  outside healthcare records

## 2023-03-14 NOTE — Assessment & Plan Note (Addendum)
 Discussed with patient about CODE STATUS in the event his heart stops or if he stops breathing patient does want to be resuscitated and remain full code.

## 2023-03-14 NOTE — Assessment & Plan Note (Signed)
 Will verify, if present CIWA protocol.

## 2023-03-14 NOTE — Plan of Care (Signed)

## 2023-03-14 NOTE — ED Triage Notes (Addendum)
 Pt to ED from home via EMS after missing 3 dialysis treatments. Had a fall last week. No thinners. Denies hitting head. 190/84 58 99% 18. BG 98 A&O x 4. Left arm limb alert

## 2023-03-14 NOTE — Assessment & Plan Note (Signed)
 Continue patient on allopurinol.

## 2023-03-14 NOTE — Assessment & Plan Note (Addendum)
 Patient states that he was trying to get out of his car a few days ago and fell and hurt his lower back.

## 2023-03-14 NOTE — ED Notes (Signed)
 Nephrology at bedside

## 2023-03-14 NOTE — Assessment & Plan Note (Signed)
 Glycemic protocol, suspect diabetes to be resolved no home meds , currently.

## 2023-03-14 NOTE — H&P (Signed)
 History and Physical    Patient: Duane Ortiz ZOX:[id number] DOB: [birth year] DOA: 03/14/2023 DOS: the patient was seen and examined on 03/14/2023 PCP: Charlane Ferretti, DO  Patient coming from: Home Chief complaint: Chief Complaint  Patient presents with   Weakness   Fall   HPI:  Duane Ortiz is a 73 y.o. male with past medical history  of allergies to Darvon, baclofen, gabapentin, sulfa antibiotics, end-stage renal disease on hemodialysis, history of PE, diabetes mellitus type 2 presents today--- for low back pain, patient fell on Friday with not history of LOC and since then has been having low back pain and has missed his hemodialysis session 3 times because of that. Ems called and initial vitals shows htn at 190/84, HR of 58 and o2 sats of 99% on RA. Pain in low back constant and unrelieved with position changes.  No reports of bandlike pain incontinence numbness tingling paresthesia saddle anesthesia weakness or falls.  >>ED Course: Pt in ed is uncomfortable in pain, afebrile hypertensive most probably secondary to pain. >>Vital signs in the ED were notable for the following: Otherwise stable. Vitals:   03/14/23 1019 03/14/23 1027 03/14/23 1300 03/14/23 1633  BP:  (!) 196/68 (!) 148/79 (!) 183/78  Pulse:  (!) 59 78 (!) 56  Temp:  (!) 97.5 F (36.4 C) 98.1 F (36.7 C) 98 F (36.7 C)  Resp:  16 18 17   Height: 5\' 11"  (1.803 m)     Weight: 87.1 kg     SpO2:  100% 99% 97%  TempSrc:  Oral  Oral  BMI (Calculated): 26.79     >>Labs were notable for the following: CMP shows bicarb of 17 creatinine of 19.84 BUN of 107 calcium 8.5 anion gap of 25 AST 58 ALT of 98 total bili of 1.3. CBC is within normal limits glucose of 82.  >>EKG: Independently reviewed: EKG shows sinus bradycardia at 57 with a PR interval of 242 and QTc of 447, EKG reads ST depression in inferior lateral leads but I feel it is nonspecific in leads II and III.  >>Imaging and additional notable ED work-up:  Chest  x-ray showed shows moderate cardiac enlargement with pulmonary interstitial edema and bibasilar atelectasis left greater than right. Lumbar spine x-ray shows anterior osteophytes no fracture or subluxation.  >>While in the ED patient received the following: Medications  Chlorhexidine Gluconate Cloth 2 % PADS 6 each (has no administration in time range)  calcitRIOL (ROCALTROL) capsule 1 mcg (has no administration in time range)  cinacalcet (SENSIPAR) tablet 30 mg (has no administration in time range)  ferric citrate (AURYXIA) tablet 420 mg (has no administration in time range)  allopurinol (ZYLOPRIM) tablet 100 mg (has no administration in time range)  cloNIDine (CATAPRES) tablet 0.1 mg (has no administration in time range)  hydrALAZINE (APRESOLINE) tablet 25 mg (has no administration in time range)  nebivolol (BYSTOLIC) tablet 10 mg (has no administration in time range)  irbesartan (AVAPRO) tablet 150 mg (has no administration in time range)  tamsulosin (FLOMAX) capsule 0.4 mg (has no administration in time range)  sodium chloride flush (NS) 0.9 % injection 3 mL (has no administration in time range)  morphine (PF) 2 MG/ML injection 2 mg (has no administration in time range)  pantoprazole (PROTONIX) injection 40 mg (has no administration in time range)  nicotine (NICODERM CQ - dosed in mg/24 hours) patch 21 mg (has no administration in time range)  HYDROcodone-acetaminophen (NORCO/VICODIN) 5-325 MG per tablet 1 tablet (1 tablet Oral Given 03/14/23  1035)    Review of Systems  Musculoskeletal:  Positive for back pain.  Neurological:  Negative for tingling, focal weakness, loss of consciousness and weakness.  All other systems reviewed and are negative.  Past Medical History:  Diagnosis Date   Acute encephalopathy 09/30/2019   AMS (altered mental status) 10/01/2019   Arthritis    Chronic kidney disease    Chronic kidney disease    Diabetes (HCC) 02/012017   Fainting    Gait abnormality  10/11/2016   GERD (gastroesophageal reflux disease)    Gout    Headache    Hypercholesteremia    Hypercholesterolemia    Hypertension    Peripheral neuropathy 04/02/2019   Pulmonary embolism (HCC)    Shortness of breath    Sleep apnea    does wear cpap   Wears dentures    Past Surgical History:  Procedure Laterality Date   A/V FISTULAGRAM Left 11/22/2022   Procedure: A/V Fistulagram;  Surgeon: Nada Libman, MD;  Location: [facility] INVASIVE CV LAB;  Service: Cardiovascular;  Laterality: Left;   A/V SHUNTOGRAM N/A 09/16/2019   Procedure: A/V SHUNTOGRAM - Left Arm;  Surgeon: Maeola Harman, MD;  Location: [facility] INVASIVE CV LAB;  Service: Cardiovascular;  Laterality: N/A;   AMPUTATION TOE Left 07/01/2020   Procedure: AMPUTATION OF LEFT HALLUS;  Surgeon: Candelaria Stagers, DPM;  Location: [facility] ORS;  Service: Podiatry;  Laterality: Left;   AV FISTULA PLACEMENT Left 08/15/2019   Procedure: LEFT ARM ARTERIOVENOUS GORE-TEX GRAFT;  Surgeon: Nada Libman, MD;  Location: [facility] OR;  Service: Vascular;  Laterality: Left;   CERVICAL DISCECTOMY     COLONOSCOPY     MULTIPLE TOOTH EXTRACTIONS     neck spine disk surgery  01/03/1993   PERIPHERAL VASCULAR BALLOON ANGIOPLASTY Left 11/22/2022   Procedure: PERIPHERAL VASCULAR BALLOON ANGIOPLASTY;  Surgeon: Nada Libman, MD;  Location: [facility] INVASIVE CV LAB;  Service: Cardiovascular;  Laterality: Left;   PERIPHERAL VASCULAR INTERVENTION Left 09/16/2019   Procedure: PERIPHERAL VASCULAR INTERVENTION;  Surgeon: Maeola Harman, MD;  Location: [facility] INVASIVE CV LAB;  Service: Cardiovascular;  Laterality: Left;  ARM FISTULS   PERIPHERAL VASCULAR INTERVENTION Left 11/22/2022   Procedure: PERIPHERAL VASCULAR INTERVENTION;  Surgeon: Nada Libman, MD;  Location: [facility] INVASIVE CV LAB;  Service: Cardiovascular;  Laterality: Left;   WRIST SURGERY      reports that he has been smoking cigarettes. He has never used smokeless tobacco. He reports that he does  not currently use alcohol. He reports that he does not use drugs.  Allergies  Allergen Reactions   Darvon [Propoxyphene] Itching   Baclofen Other (See Comments)    confusion   Gabapentin     Felt like I was out of my head, slept all day, confusion    Sulfa Antibiotics Itching    Family History  Problem Relation Age of Onset   Diabetes Mother    Heart disease Mother    Hypertension Mother    Diabetes Sister    Hypertension Sister    Diabetes Brother    Hypertension Brother    Breast cancer Paternal Grandmother     Prior to Admission medications   Medication Sig Start Date End Date Taking? Authorizing Provider  allopurinol (ZYLOPRIM) 100 MG tablet Take 100 mg by mouth at bedtime.    [provider]  amLODipine (NORVASC) 10 MG tablet Take 10 mg by mouth at bedtime. 04/06/20   [provider]  ammonium lactate (LAC-HYDRIN) 12 % lotion  Apply 1 Application topically as needed (itching.). 08/08/18   [provider]  cinacalcet (SENSIPAR) 30 MG tablet Take 30 mg by mouth 3 (three) times a week.    [provider]  cyclobenzaprine (FLEXERIL) 5 MG tablet Take 5 mg by mouth daily as needed for muscle spasms. 10/25/22   [provider]  ferric citrate (AURYXIA) 1 GM 210 MG(Fe) tablet Take 210 mg by mouth 2 (two) times daily with a meal.    [provider]  hydrALAZINE (APRESOLINE) 25 MG tablet Take 1 tablet (25 mg total) by mouth 3 (three) times daily. Patient taking differently: Take 25 mg by mouth at bedtime. 09/24/19   Hollice Espy, MD  lidocaine-prilocaine (EMLA) cream Apply 1 Application topically 3 (three) times a week. Before accessing with needle    [provider]  olmesartan (BENICAR) 20 MG tablet Take 20 mg by mouth at bedtime. 12/10/21   [provider]  tamsulosin (FLOMAX) 0.4 MG CAPS capsule Take 0.4 mg by mouth at bedtime.     [provider]                                                                                    Vitals:   03/14/23 1019 03/14/23 1027 03/14/23 1300 03/14/23 1633  BP:  (!) 196/68 (!) 148/79 (!) 183/78  Pulse:  (!) 59 78 (!) 56  Resp:  16 18 17   Temp:  (!) 97.5 F (36.4 C) 98.1 F (36.7 C) 98 F (36.7 C)  TempSrc:  Oral  Oral  SpO2:  100% 99% 97%  Weight: 87.1 kg     Height: 5\' 11"  (1.803 m)      Physical Exam Vitals and nursing note reviewed.  Constitutional:      General: He is not in acute distress. HENT:     Head: Normocephalic and atraumatic.     Right Ear: Hearing normal.     Left Ear: Hearing normal.     Nose: Nose normal. No nasal deformity.     Mouth/Throat:     Lips: Pink.     Tongue: No lesions.     Pharynx: Oropharynx is clear.  Eyes:     General: Lids are normal.     Extraocular Movements: Extraocular movements intact.  Cardiovascular:     Rate and Rhythm: Normal rate and regular rhythm.     Heart sounds: Normal heart sounds.  Pulmonary:     Effort: Pulmonary effort is normal. No respiratory distress.     Breath sounds: Normal breath sounds. No rales.  Abdominal:     General: Bowel sounds are normal. There is no distension.     Palpations: Abdomen is soft. There is no mass.     Tenderness: There is no abdominal tenderness.  Musculoskeletal:     Right lower leg: No edema.     Left lower leg: No edema.  Skin:    General: Skin is warm.  Neurological:     General: No focal deficit present.     Mental Status: He is alert and oriented to person, place, and time.     Cranial Nerves: Cranial nerves 2-12 are intact.  Psychiatric:        Attention and Perception: Attention normal.        Mood and Affect: Mood normal.        Speech: Speech normal.        Behavior: Behavior normal. Behavior is cooperative.      Labs on Admission: I have personally reviewed following labs and imaging studies  CBC: Recent Labs  Lab 03/14/23 1030  WBC 8.2  NEUTROABS 6.3  HGB 13.5  HCT 42.4  MCV 88.5  PLT 185   Basic Metabolic  Panel: Recent Labs  Lab 03/14/23 1220  NA 140  K 4.6  CL 98  CO2 17*  GLUCOSE 82  BUN 107*  CREATININE 19.84*  CALCIUM 8.5*   GFR: Estimated Creatinine Clearance: 3.4 mL/min (A) (by C-G formula based on SCr of 19.84 mg/dL (H)). Liver Function Tests: Recent Labs  Lab 03/14/23 1220  AST 58*  ALT 90*  ALKPHOS 78  BILITOT 1.3*  PROT 6.5  ALBUMIN 3.0*   No results for input(s): "LIPASE", "AMYLASE" in the last 168 hours. No results for input(s): "AMMONIA" in the last 168 hours. Coagulation Profile: No results for input(s): "INR", "PROTIME" in the last 168 hours. Cardiac Enzymes: No results for input(s): "CKTOTAL", "CKMB", "CKMBINDEX", "TROPONINI" in the last 168 hours. BNP (last 3 results) No results for input(s): "PROBNP" in the last 8760 hours. HbA1C: No results for input(s): "HGBA1C" in the last 72 hours. CBG: No results for input(s): "GLUCAP" in the last 168 hours. Lipid Profile: No results for input(s): "CHOL", "HDL", "LDLCALC", "TRIG", "CHOLHDL", "LDLDIRECT" in the last 72 hours. Thyroid Function Tests: No results for input(s): "TSH", "T4TOTAL", "FREET4", "T3FREE", "THYROIDAB" in the last 72 hours. Anemia Panel: No results for input(s): "VITAMINB12", "FOLATE", "FERRITIN", "TIBC", "IRON", "RETICCTPCT" in the last 72 hours. Urine analysis:    Component Value Date/Time   COLORURINE YELLOW 10/26/2019 0530   APPEARANCEUR CLEAR 10/26/2019 0530   LABSPEC 1.013 10/26/2019 0530   PHURINE 5.0 10/26/2019 0530   GLUCOSEU NEGATIVE 10/26/2019 0530   HGBUR SMALL (A) 10/26/2019 0530   BILIRUBINUR NEGATIVE 10/26/2019 0530   KETONESUR NEGATIVE 10/26/2019 0530   PROTEINUR 100 (A) 10/26/2019 0530   NITRITE NEGATIVE 10/26/2019 0530   LEUKOCYTESUR NEGATIVE 10/26/2019 0530   Radiological Exams on Admission: DG Lumbar Spine Complete Result Date: 03/14/2023 CLINICAL DATA:  Fall with back pain. EXAM: LUMBAR SPINE - COMPLETE 4+ VIEW COMPARISON:  None Available. FINDINGS: No acute  fracture or subluxation. Flowing anterior osteophytes throughout the lumbar spine. No bony lesions or destruction. Calcified plaque in the abdominal aorta. IMPRESSION: No acute findings. Flowing anterior osteophytes throughout the lumbar spine. Aortic atherosclerosis. Electronically Signed   By: Irish Lack M.D.   On: 03/14/2023 14:31   DG Chest 2 View Result Date: 03/14/2023 CLINICAL DATA:  Fall with back pain. EXAM: CHEST - 2 VIEW COMPARISON:  10/24/2019 FINDINGS: Moderate cardiac enlargement. Ten show mild pulmonary interstitial edema without overt airspace edema. Mild bibasilar atelectasis, left greater than right. No significant pleural effusions or evidence of pneumothorax. The visualized skeletal structures are unremarkable. IMPRESSION: Moderate cardiac enlargement with mild pulmonary interstitial edema. Mild bibasilar atelectasis, left greater than right. Electronically Signed   By: Irish Lack M.D.   On: 03/14/2023 14:29    Data Reviewed: Relevant notes from primary care and specialist visits, past discharge summaries as available in EHR, including Care Everywhere. Prior diagnostic testing as pertinent to current admission diagnoses, Updated medications and problem lists for reconciliation ED course,  including vitals, labs, imaging, treatment and response to treatment,Triage notes, nursing and pharmacy notes and ED provider's notes Notable results as noted in HPI.Discussed case with EDMD/ ED APP/ or Specialty MD on call and as needed.  Assessment & Plan Fall Patient states that he was trying to get out of his car a few days ago and fell and hurt his lower back. Gout Continue patient on allopurinol. Essential hypertension Continue patient on home regimen of clonidine, hydralazine, irbesartan in place of Benicar, Bystolic. Type 2 diabetes mellitus with renal complication (HCC) Glycemic protocol, suspect diabetes to be resolved no home meds , currently. GERD (gastroesophageal  reflux disease) IV PPI.  Aspiration precaution. History of alcohol abuse Will verify, if present CIWA protocol. History of pulmonary embolism No anticoagulation on board currently we will verify if patient is completed treatment for the plan.  And start DVT prophylaxis. ESRD (end stage renal disease) on dialysis [facility]) She has been consulted.  Continue patient on allopurinol, Sensipar, strict I's and O's, avoid MRI with contrast. Chest x-ray shows interstitial pulmonary edema patient is stable not requiring oxygen and no distress. Full code status Discussed with patient about CODE STATUS in the event his heart stops or if he stops breathing patient does want to be resuscitated and remain full code.  DVT prophylaxis:  SCDs or heparin  Consults:  Nephrology  Advance Care Planning:    Code Status: Full Code   Family Communication:  None Disposition Plan:  Home Severity of Illness: The appropriate patient status for this patient is INPATIENT. Inpatient status is judged to be reasonable and necessary in order to provide the required intensity of service to ensure the patient's safety. The patient's presenting symptoms, physical exam findings, and initial radiographic and laboratory data in the context of their chronic comorbidities is felt to place them at high risk for further clinical deterioration. Furthermore, it is not anticipated that the patient will be medically stable for discharge from the hospital within 2 midnights of admission.   * I certify that at the point of admission it is my clinical judgment that the patient will require inpatient hospital care spanning beyond 2 midnights from the point of admission due to high intensity of service, high risk for further deterioration and high frequency of surveillance required.*  Author: Gertha Calkin, MD 03/14/2023 4:41 PM  For on call review [email or web address].   Unresulted Labs (From admission, onward)     Start     Ordered    03/15/23 0500  Comprehensive metabolic panel  Tomorrow morning,   R        03/14/23 1608   03/15/23 0500  CBC  Tomorrow morning,   R        03/14/23 1608   03/14/23 1523  Hepatitis B surface antibody,quantitative  (New Admission Hemo Labs (Hepatitis B))  ONCE - URGENT,   URGENT        03/14/23 1522   03/14/23 1523  Hepatitis B surface antigen  (New Admission Hemo Labs (Hepatitis B))  ONCE - URGENT,   URGENT        03/14/23 1522   03/14/23 1520  Phosphorus  Add-on,   AD        03/14/23 1519            Orders Placed This Encounter  Procedures   DG Chest 2 View   DG Lumbar Spine Complete   CT LUMBAR SPINE WO CONTRAST   CT PELVIS WO CONTRAST   CBC with  Differential   Comprehensive metabolic panel   Phosphorus   Hepatitis B surface antibody,quantitative   Hepatitis B surface antigen   Comprehensive metabolic panel   CBC   Diet renal/carb modified with fluid restriction Diet-HS Snack? Nothing; Fluid restriction: 1200 mL Fluid; Room service appropriate? Yes; Fluid consistency: Thin   Informed Consent Details: Physician/Practitioner Attestation; Transcribe to consent form and obtain patient signature   Pre-Hemodialysis Protocol - Day of Dialysis   Post-Dialysis Protocol - Day of Dialysis   Maintain IV access   Vital signs   Notify physician (specify)   Mobility Protocol: No Restrictions RN to initiate protocols based on patient's level of care   Refer to Sidebar Report Refer to ICU, Med-Surg, Progressive, and Step-Down Mobility Protocol Sidebars   Initiate Adult Central Line Maintenance and Catheter Protocol for patients with central line (CVC, PICC, Port, Hemodialysis, Trialysis)   Daily weights   Intake and Output   Do not place and if present remove PureWick   Initiate Oral Care Protocol   Initiate Carrier Fluid Protocol   RN may order General Admission PRN Orders utilizing "General Admission PRN medications" (through manage orders) for the following patient needs: allergy  symptoms (Claritin), cold sores (Carmex), cough (Robitussin DM), eye irritation (Liquifilm Tears), hemorrhoids (Tucks), indigestion (Maalox), minor skin irritation (Hydrocortisone Cream), muscle pain Romeo Apple Gay), nose irritation (saline nasal spray) and sore throat (Chloraseptic spray).   Cardiac Monitoring - Continuous Indefinite   Full code   Consult to nephrology   Consult to hospitalist   Pulse oximetry check with vital signs   Oxygen therapy Mode or (Route): Nasal cannula; Liters Per Minute: 2; Keep O2 saturation between: greater than 92 %   ED EKG   Saline lock IV   Hemodialysis inpatient   Admit to Inpatient (patient's expected length of stay will be greater than 2 midnights or inpatient only procedure)   Aspiration precautions   Fall precautions

## 2023-03-14 NOTE — Assessment & Plan Note (Addendum)
 IV PPI. Aspiration precaution.

## 2023-03-14 NOTE — ED Provider Notes (Signed)
 [place] EMERGENCY DEPARTMENT AT [facility] Provider Note   CSN: [id number] Arrival date & time: 03/14/23  1012     History  Chief Complaint  Patient presents with   Weakness   Fall    Duane Ortiz is a 73 y.o. male.  73 year old male with prior medical history as detailed below presents for evaluation.  Patient reports that he felt late last week.  He had significant back pain after the fall.  He reports that he is having difficulty ambulating because of his pain.  He typically goes to dialysis on Tuesday, Thursday, Saturday.  He missed dialysis on Saturday because of his back pain.  He missed dialysis today because of his back pain.  He can barely sit, much less stand and walk because of his pain.  He reports no improvement with pain with use of Tylenol at home.  The history is provided by the patient and medical records.       Home Medications Prior to Admission medications   Medication Sig Start Date End Date Taking? Authorizing Provider  allopurinol (ZYLOPRIM) 100 MG tablet Take 100 mg by mouth at bedtime.    [provider]  amLODipine (NORVASC) 10 MG tablet Take 10 mg by mouth at bedtime. 04/06/20   [provider]  ammonium lactate (LAC-HYDRIN) 12 % lotion Apply 1 Application topically as needed (itching.). 08/08/18   [provider]  cinacalcet (SENSIPAR) 30 MG tablet Take 30 mg by mouth 3 (three) times a week.    [provider]  cyclobenzaprine (FLEXERIL) 5 MG tablet Take 5 mg by mouth daily as needed for muscle spasms. 10/25/22   [provider]  ferric citrate (AURYXIA) 1 GM 210 MG(Fe) tablet Take 210 mg by mouth 2 (two) times daily with a meal.    [provider]  hydrALAZINE (APRESOLINE) 25 MG tablet Take 1 tablet (25 mg total) by mouth 3 (three) times daily. Patient taking differently: Take 25 mg by mouth at bedtime. 09/24/19   Hollice Espy, MD  lidocaine-prilocaine (EMLA) cream Apply 1  Application topically 3 (three) times a week. Before accessing with needle    [provider]  olmesartan (BENICAR) 20 MG tablet Take 20 mg by mouth at bedtime. 12/10/21   [provider]  tamsulosin (FLOMAX) 0.4 MG CAPS capsule Take 0.4 mg by mouth at bedtime.     [provider]      Allergies    Darvon [propoxyphene], Baclofen, Gabapentin, and Sulfa antibiotics    Review of Systems   Review of Systems  All other systems reviewed and are negative.   Physical Exam Updated Vital Signs BP (!) 196/68 (BP Location: Right Arm)   Pulse (!) 59   Temp (!) 97.5 F (36.4 C) (Oral)   Resp 16   Ht 5\' 11"  (1.803 m)   Wt 87.1 kg   SpO2 100%   BMI 26.78 kg/m  Physical Exam Vitals and nursing note reviewed.  Constitutional:      General: He is not in acute distress.    Appearance: Normal appearance. He is well-developed.  HENT:     Head: Normocephalic and atraumatic.  Eyes:     Conjunctiva/sclera: Conjunctivae normal.     Pupils: Pupils are equal, round, and reactive to light.  Cardiovascular:     Rate and Rhythm: Normal rate and regular rhythm.     Heart sounds: Normal heart sounds.  Pulmonary:     Effort: Pulmonary effort is normal. No  respiratory distress.     Breath sounds: Normal breath sounds.  Abdominal:     General: There is no distension.     Palpations: Abdomen is soft.     Tenderness: There is no abdominal tenderness.  Musculoskeletal:        General: Tenderness present. No deformity. Normal range of motion.     Cervical back: Normal range of motion and neck supple.     Comments: Diffuse tenderness to the lower lumbar back.  Bilateral lower extremities with 5 out of 5 strength.  Skin:    General: Skin is warm and dry.  Neurological:     General: No focal deficit present.     Mental Status: He is alert and oriented to person, place, and time.     ED Results / Procedures / Treatments   Labs (all labs ordered are listed, but only  abnormal results are displayed) Labs Reviewed  CBC WITH DIFFERENTIAL/PLATELET - Abnormal; Notable for the following components:      Result Value   RDW 19.0 (*)    All other components within normal limits  COMPREHENSIVE METABOLIC PANEL - Abnormal; Notable for the following components:   CO2 17 (*)    BUN 107 (*)    Creatinine, Ser 19.84 (*)    Calcium 8.5 (*)    Albumin 3.0 (*)    AST 58 (*)    ALT 90 (*)    Total Bilirubin 1.3 (*)    GFR, Estimated 2 (*)    Anion gap 25 (*)    All other components within normal limits    EKG None  Radiology No results found.  Procedures Procedures    Medications Ordered in ED Medications  HYDROcodone-acetaminophen (NORCO/VICODIN) 5-325 MG per tablet 1 tablet (has no administration in time range)    ED Course/ Medical Decision Making/ A&P Clinical Course as of 03/14/23 1514  Tue Mar 14, 2023  1513 Potassium: 4.6 [PM]    Clinical Course User Index [PM] Wynetta Fines, MD                                 Medical Decision Making Amount and/or Complexity of Data Reviewed Labs: ordered. Decision-making details documented in ED Course. Radiology: ordered.  Risk Prescription drug management. Decision regarding hospitalization.    Medical Screen Complete  This patient presented to the ED with complaint of back pain, missed dialysis.  This complaint involves an extensive number of treatment options. The initial differential diagnosis includes, but is not limited to, metabolic abnormality, fracture, etc.  This presentation is: Acute, Chronic, Self-Limited, Previously Undiagnosed, Uncertain Prognosis, Complicated, Systemic Symptoms, and Threat to Life/Bodily Function  Patient with known history of ESRD on HD.  Patient's typical session schedule is Tuesday, Thursday, Saturday.  He missed dialysis both today and on Saturday.  He reports a fall last week on Thursday or Friday.  He reports significant low back pain after this  fall.  He has been unable to ambulate secondary to the pain.  He denies weakness in his lower extremities.  With pain medication here in the ED he is minimally improved.  He is able to now sit but cannot ambulate in a stable manner.  Imaging of his lumbar spine is without acute pathology identified.  Initial labs are concerning for a creatinine of 19.8, BUN of 107, potassium of 4.6.  Nephrology [facility]) is aware of case.  Nephrology will consult.  Hospitalist service made aware of case and need for admission.  Co morbidities that complicated the patient's evaluation  See HPI   Additional history obtained: External records from outside sources obtained and reviewed including prior ED visits and prior Inpatient records.    Lab Tests:  I ordered and personally interpreted labs.  The pertinent results include: CBC, CMP   Imaging Studies ordered:  I ordered imaging studies including plain films of lumbar spine and chest x-ray I independently visualized and interpreted obtained imaging which showed no acute fracture, likely mild to moderate pulmonary edema I agree with the radiologist interpretation.   Cardiac Monitoring:  The patient was maintained on a cardiac monitor.  I personally viewed and interpreted the cardiac monitor which showed an underlying rhythm of: NSR   Problem List / ED Course:  ESRD on HD, missed dialysis, low back pain   Disposition:  After consideration of the diagnostic results and the patients response to treatment, I feel that the patent would benefit from admission.          Final Clinical Impression(s) / ED Diagnoses Final diagnoses:  ESRD (end stage renal disease) (HCC)  Acute low back pain without sciatica, unspecified back pain laterality    Rx / DC Orders ED Discharge Orders     None         Wynetta Fines, MD 03/14/23 1514

## 2023-03-14 NOTE — Progress Notes (Signed)
 Pt receives out-pt HD at [facility] [place] on TTS 6:30 am chair time. Will assist as needed.   Olivia Canter Renal Navigator [phone]

## 2023-03-14 NOTE — Assessment & Plan Note (Signed)
 Patient states that he was trying to get out of his car a few days ago and fell and hurt his lower back. With his ongoing pain  we will obtain Ct spine lumbar and pelvis.

## 2023-03-14 NOTE — ED Notes (Signed)
 Stat lab asked to attempt to draw pt's hepatitis B labs for dialysis. Pt is difficult stick

## 2023-03-14 NOTE — Assessment & Plan Note (Addendum)
 No anticoagulation on board currently we will verify if patient is completed treatment for the plan.  And start DVT prophylaxis.

## 2023-03-14 NOTE — Assessment & Plan Note (Deleted)
Will verify CODE STATUS.

## 2023-03-14 NOTE — Assessment & Plan Note (Signed)
 Continue patient on home regimen of clonidine, hydralazine, irbesartan in place of Benicar, Bystolic.

## 2023-03-14 NOTE — Assessment & Plan Note (Signed)
 She has been consulted.  Continue patient on allopurinol, Sensipar, strict I's and O's, avoid MRI with contrast. Chest x-ray shows interstitial pulmonary edema patient is stable not requiring oxygen and no distress.

## 2023-03-15 ENCOUNTER — Inpatient Hospital Stay (HOSPITAL_COMMUNITY)

## 2023-03-15 DIAGNOSIS — M545 Low back pain, unspecified: Secondary | ICD-10-CM

## 2023-03-15 DIAGNOSIS — N186 End stage renal disease: Secondary | ICD-10-CM | POA: Diagnosis not present

## 2023-03-15 LAB — COMPREHENSIVE METABOLIC PANEL
ALT: 82 U/L — ABNORMAL HIGH (ref 0–44)
AST: 48 U/L — ABNORMAL HIGH (ref 15–41)
Albumin: 3 g/dL — ABNORMAL LOW (ref 3.5–5.0)
Alkaline Phosphatase: 88 U/L (ref 38–126)
Anion gap: 24 — ABNORMAL HIGH (ref 5–15)
BUN: 115 mg/dL — ABNORMAL HIGH (ref 8–23)
CO2: 17 mmol/L — ABNORMAL LOW (ref 22–32)
Calcium: 8.6 mg/dL — ABNORMAL LOW (ref 8.9–10.3)
Chloride: 96 mmol/L — ABNORMAL LOW (ref 98–111)
Creatinine, Ser: 20.23 mg/dL — ABNORMAL HIGH (ref 0.61–1.24)
GFR, Estimated: 2 mL/min — ABNORMAL LOW (ref >60)
Glucose, Bld: 105 mg/dL — ABNORMAL HIGH (ref 70–99)
Potassium: 4.5 mmol/L (ref 3.5–5.1)
Sodium: 137 mmol/L (ref 135–145)
Total Bilirubin: 0.9 mg/dL (ref 0.0–1.2)
Total Protein: 6.5 g/dL (ref 6.5–8.1)

## 2023-03-15 LAB — CBC
HCT: 41 % (ref 39.0–52.0)
Hemoglobin: 13.4 g/dL (ref 13.0–17.0)
MCH: 28.1 pg (ref 26.0–34.0)
MCHC: 32.7 g/dL (ref 30.0–36.0)
MCV: 86 fL (ref 80.0–100.0)
Platelets: 171 10*3/uL (ref 150–400)
RBC: 4.77 MIL/uL (ref 4.22–5.81)
RDW: 18.6 % — ABNORMAL HIGH (ref 11.5–15.5)
WBC: 6.7 10*3/uL (ref 4.0–10.5)
nRBC: 0.3 % — ABNORMAL HIGH (ref 0.0–0.2)

## 2023-03-15 LAB — HEPATITIS B SURFACE ANTIBODY, QUANTITATIVE: Hep B S AB Quant (Post): 191 m[IU]/mL

## 2023-03-15 MED ORDER — HEPARIN SODIUM (PORCINE) 5000 UNIT/ML IJ SOLN
5000.0000 [IU] | Freq: Three times a day (TID) | INTRAMUSCULAR | Status: DC
  Administered 2023-03-16 – 2023-03-22 (×18): 5000 [IU] via SUBCUTANEOUS
  Filled 2023-03-15 (×19): qty 1

## 2023-03-15 MED ORDER — HYDROMORPHONE HCL 1 MG/ML IJ SOLN: 0.5000 mg | INTRAMUSCULAR | Status: DC | PRN

## 2023-03-15 MED ORDER — AMLODIPINE BESYLATE 10 MG PO TABS
10.0000 mg | ORAL_TABLET | Freq: Every day | ORAL | Status: DC
  Administered 2023-03-16 – 2023-03-21 (×7): 10 mg via ORAL
  Filled 2023-03-15 (×7): qty 1

## 2023-03-15 MED ORDER — PANTOPRAZOLE SODIUM 40 MG PO TBEC
40.0000 mg | DELAYED_RELEASE_TABLET | Freq: Every day | ORAL | Status: DC
  Administered 2023-03-15 – 2023-03-22 (×8): 40 mg via ORAL
  Filled 2023-03-15 (×9): qty 1

## 2023-03-15 MED ORDER — CLONIDINE HCL 0.1 MG PO TABS
0.1000 mg | ORAL_TABLET | Freq: Three times a day (TID) | ORAL | Status: DC
  Administered 2023-03-16 – 2023-03-17 (×4): 0.1 mg via ORAL
  Filled 2023-03-15 (×4): qty 1

## 2023-03-15 MED ORDER — HYDRALAZINE HCL 25 MG PO TABS
25.0000 mg | ORAL_TABLET | Freq: Three times a day (TID) | ORAL | Status: DC
  Administered 2023-03-16 – 2023-03-22 (×17): 25 mg via ORAL
  Filled 2023-03-15 (×19): qty 1

## 2023-03-15 NOTE — Progress Notes (Signed)
 Duane Ortiz  ZOX:[id number] DOB: 03-Apr-1950 DOA: 03/14/2023 PCP: Charlane Ferretti, DO    Brief Narrative:  73 year old with a history of ESRD on HD, gout, HTN, PE, and DM2 who presented to the ER 3/11 with complaints of low back pain after having suffered a fall 4-5 days prior.  The patient had not attended his last 2 HD sessions due to his pain.  Lumbar spine x-rays in the ER revealed anterior osteophytes but no acute fracture or subluxation.  Goals of Care:   Code Status: Full Code   DVT prophylaxis: heparin injection 5,000 Units Start: 03/15/23 1600  Interim Hx: Afebrile.  Blood pressure elevated with systolics 170-181.  Resting comfortably in bed.  Denies any new complaints.  Is anxious to get up and get moving.  States that his back pain is much improved.  Assessment & Plan:  Low back pain following mechanical fall Plain films of the lumbar spine revealed no acute findings - CT of the lumbar spine revealed no acute findings and noted generalized spondylitic spurring most bulky at L3-4 and L4-5 - CT pelvis without acute findings -PT/OT  Gout Continue usual allopurinol  Uncontrolled HTN - volume overload BP elevated at time of presentation in setting of multiple missed HD appointments -adjust medical therapy and monitor with ongoing dialysis treatments  ESRD on HD Care per Nephrology - missed multiple outpatient appointments prior to his admission - utilizes an AV graft  Anemia of ESRD Erythropoietin and iron at discretion of Nephrology  DM2 Does not appear to require chronic home medical therapy  History of pulmonary embolism Does not appear to require chronic anticoagulation at this time   Family Communication: No family present Disposition: PT/OT evaluations pending   Objective: Blood pressure (!) 180/67, pulse 66, temperature 97.8 F (36.6 C), resp. rate 19, height 5\' 11"  (1.803 m), weight 88 kg, SpO2 100%.  Intake/Output Summary (Last 24 hours) at 03/15/2023  1658 Last data filed at 03/15/2023 1230 Gross per 24 hour  Intake 236 ml  Output 3000 ml  Net -2764 ml   Filed Weights   03/14/23 1019 03/15/23 0825 03/15/23 1230  Weight: 87.1 kg 91.1 kg 88 kg    Examination: General: No acute respiratory distress Lungs: Clear to auscultation bilaterally without wheezes or crackles Cardiovascular: Regular rate and rhythm without murmur gallop or rub normal S1 and S2 Abdomen: Nontender, nondistended, soft, bowel sounds positive, no rebound, no ascites, no appreciable mass Extremities: No significant cyanosis, clubbing, or edema bilateral lower extremities  CBC: Recent Labs  Lab 03/14/23 1030 03/15/23 0406  WBC 8.2 6.7  NEUTROABS 6.3  --   HGB 13.5 13.4  HCT 42.4 41.0  MCV 88.5 86.0  PLT 185 171   Basic Metabolic Panel: Recent Labs  Lab 03/14/23 1220 03/14/23 1518 03/15/23 0406  NA 140  --  137  K 4.6  --  4.5  CL 98  --  96*  CO2 17*  --  17*  GLUCOSE 82  --  105*  BUN 107*  --  115*  CREATININE 19.84*  --  20.23*  CALCIUM 8.5*  --  8.6*  PHOS  --  >30.0*  --    GFR: Estimated Creatinine Clearance: 3.4 mL/min (A) (by C-G formula based on SCr of 20.23 mg/dL (H)).   Scheduled Meds:  allopurinol  100 mg Oral QHS   amLODipine  10 mg Oral QHS   calcitRIOL  1 mcg Oral Q T,Th,Sat-1800   Chlorhexidine Gluconate Cloth  6 each  Topical Q0600   cinacalcet  30 mg Oral Q T,Th,Sat-1800   cloNIDine  0.1 mg Oral BID   ferric citrate  420 mg Oral TID WC   heparin injection (subcutaneous)  5,000 Units Subcutaneous Q8H   hydrALAZINE  25 mg Oral QHS   irbesartan  150 mg Oral Daily   nebivolol  10 mg Oral QHS   nicotine  21 mg Transdermal Daily   pantoprazole  40 mg Oral Daily   sodium chloride flush  3 mL Intravenous Q12H   tamsulosin  0.4 mg Oral QHS     LOS: 1 day   Lonia Blood, MD [facility] Office  [phone] Pager - Text Page per Loretha Stapler  If 7PM-7AM, please contact night-coverage per Amion 03/15/2023, 4:58  PM

## 2023-03-15 NOTE — Progress Notes (Signed)
   03/15/23 1230  Vitals  Temp 97.8 F (36.6 C)  Pulse Rate (!) 59  Resp 14  BP (!) 183/69  SpO2 100 %  O2 Device Room Air  Weight 88 kg  Type of Weight Post-Dialysis  Post Treatment  Dialyzer Clearance Lightly streaked  Hemodialysis Intake (mL) 0 mL  Liters Processed 80.2  Fluid Removed (mL) 3000 mL  Tolerated HD Treatment Yes  AVG/AVF Arterial Site Held (minutes) 10 minutes  AVG/AVF Venous Site Held (minutes) 10 minutes   Received patient in bed to unit.  Alert and oriented.  Informed consent signed and in chart.   TX duration:3.5hrs  Patient tolerated well.  Transported back to the room  Alert, without acute distress.  Hand-off given to patient's nurse.   Access used: LAVG Access issues: none  Total UF removed: 3L Medication(s) given: none    [name]

## 2023-03-15 NOTE — Progress Notes (Signed)
  [facility] Progress Note   Subjective:  Completed dialysis. Tolerated 3L UF.  No complaints. Denies cp, sob, n.v.   Objective Vitals:   03/15/23 1030 03/15/23 1100 03/15/23 1130 03/15/23 1200  BP: (!) 176/71 (!) 177/74 (!) 177/69 (!) 175/76  Pulse: (!) 58 (!) 58 (!) 58 (!) 58  Resp: 16 14 13 11   Temp:      TempSrc:      SpO2: 100% 100% 100% 100%  Weight:      Height:         Additional Objective Labs: Basic Metabolic Panel: Recent Labs  Lab 03/14/23 1220 03/14/23 1518 03/15/23 0406  NA 140  --  137  K 4.6  --  4.5  CL 98  --  96*  CO2 17*  --  17*  GLUCOSE 82  --  105*  BUN 107*  --  115*  CREATININE 19.84*  --  20.23*  CALCIUM 8.5*  --  8.6*  PHOS  --  >30.0*  --    CBC: Recent Labs  Lab 03/14/23 1030 03/15/23 0406  WBC 8.2 6.7  NEUTROABS 6.3  --   HGB 13.5 13.4  HCT 42.4 41.0  MCV 88.5 86.0  PLT 185 171   Blood Culture No results found for: "SDES", "SPECREQUEST", "CULT", "REPTSTATUS"   Physical Exam General: Alert, nad  Heart: RRR Lungs: Clear normal wob Abdomen: soft no masses Extremities: Trace LE edema  Dialysis Access: LUE AVF +bruit   Medications:   allopurinol  100 mg Oral QHS   amLODipine  10 mg Oral QHS   calcitRIOL  1 mcg Oral Q T,Th,Sat-1800   Chlorhexidine Gluconate Cloth  6 each Topical Q0600   cinacalcet  30 mg Oral Q T,Th,Sat-1800   cloNIDine  0.1 mg Oral BID   ferric citrate  420 mg Oral TID WC   hydrALAZINE  25 mg Oral QHS   irbesartan  150 mg Oral Daily   nebivolol  10 mg Oral QHS   nicotine  21 mg Transdermal Daily   pantoprazole  40 mg Oral Daily   sodium chloride flush  3 mL Intravenous Q12H   tamsulosin  0.4 mg Oral QHS    Outpatient dialysis unit: [facility] prescription: Had treatment on 3/4, missed 3/8, 3/6, 3/1 F180, BFR 800, 2K, 2.5 Ca, 4hrs, EDW 89.5, LUE AVG, no esa, calcitriol w/ treatments, sensipar 30mg  TTS, auryxia 2 tabs w/ meals  Assessment/Plan: ESRD -  HD TTS.  Noncompliant and missed multiple treatments over the last few days. HD off schedule today. Back on schedule tomorrow.  Back pain - Related to fall. Per primary  HTN/volume-  BP elevated with volume overload. Continue home meds  UF as able.  4. Anemia-  Hgb above goal. 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 Calcitriol, Sensipar. Auryxia binder.  Phos >30 on admit.   Tomasa Blase PA-C [facility] 03/15/2023,12:20 PM

## 2023-03-15 NOTE — Plan of Care (Signed)

## 2023-03-15 NOTE — Procedures (Signed)
 I was present at this dialysis session. I have reviewed the session itself and made appropriate changes.   Filed Weights   03/14/23 1019 03/15/23 0825  Weight: 87.1 kg 91.1 kg    Recent Labs  Lab 03/14/23 1518 03/15/23 0406  NA  --  137  K  --  4.5  CL  --  96*  CO2  --  17*  GLUCOSE  --  105*  BUN  --  115*  CREATININE  --  20.23*  CALCIUM  --  8.6*  PHOS >30.0*  --     Recent Labs  Lab 03/14/23 1030 03/15/23 0406  WBC 8.2 6.7  NEUTROABS 6.3  --   HGB 13.5 13.4  HCT 42.4 41.0  MCV 88.5 86.0  PLT 185 171    Scheduled Meds:  allopurinol  100 mg Oral QHS   amLODipine  10 mg Oral QHS   calcitRIOL  1 mcg Oral Q T,Th,Sat-1800   Chlorhexidine Gluconate Cloth  6 each Topical Q0600   cinacalcet  30 mg Oral Q T,Th,Sat-1800   cloNIDine  0.1 mg Oral BID   ferric citrate  420 mg Oral TID WC   hydrALAZINE  25 mg Oral QHS   irbesartan  150 mg Oral Daily   nebivolol  10 mg Oral QHS   nicotine  21 mg Transdermal Daily   pantoprazole  40 mg Oral Daily   sodium chloride flush  3 mL Intravenous Q12H   tamsulosin  0.4 mg Oral QHS   Continuous Infusions: PRN Meds:.hydrALAZINE, HYDROmorphone (DILAUDID) injection   Louie Bun,  MD 03/15/2023, 10:42 AM

## 2023-03-16 DIAGNOSIS — M545 Low back pain, unspecified: Secondary | ICD-10-CM | POA: Diagnosis not present

## 2023-03-16 DIAGNOSIS — N186 End stage renal disease: Secondary | ICD-10-CM | POA: Diagnosis not present

## 2023-03-16 MED ORDER — ACETAMINOPHEN 325 MG PO TABS: 650.0000 mg | ORAL_TABLET | Freq: Four times a day (QID) | ORAL | Status: DC | PRN

## 2023-03-16 MED ORDER — QUETIAPINE FUMARATE 25 MG PO TABS
12.5000 mg | ORAL_TABLET | Freq: Every day | ORAL | Status: DC
  Administered 2023-03-16 – 2023-03-21 (×6): 12.5 mg via ORAL
  Filled 2023-03-16 (×7): qty 1

## 2023-03-16 MED ORDER — HYDRALAZINE HCL 25 MG PO TABS: 25.0000 mg | ORAL_TABLET | Freq: Four times a day (QID) | ORAL | Status: DC | PRN

## 2023-03-16 NOTE — Evaluation (Signed)
 Physical Therapy Evaluation Patient Details Name: Duane Ortiz MRN: [id number] DOB: Jan 23, 1950 Today's Date: 03/16/2023  History of Present Illness  Pt is 73 yo male who presents on 03/14/23 with fall and then missing 3 HD treatments due to back pain from that fall. Xray of lumbar spine revealed anterior osteophytes but no acute fx or subluxation. PMH: ESRD on HD, DM2, PE, peripheral neuropathy, L toe amps 1 and 2  Clinical Impression  Pt admitted with above diagnosis. Pt from home alone in an apt. Son reports that pt has been driving, caring for self, cooking. However, sounds like he has had multiple falls and balance has been declining since last toe amputation. Today pt has been seeing ants on floor, is confused about whether he's in [place] or [place], and is having sequencing deficits. Educated on back precautions for pain control. He describes pain as being bilateral lower scapular regions. Needed min A to stand and ambulate short distance in room. LE's shaking with ambulation and RLE dragging on floor. Currently not safe to return to his apt alone. Patient will benefit from continued inpatient follow up therapy, <3 hours/day.  Pt currently with functional limitations due to the deficits listed below (see PT Problem List). Pt will benefit from acute skilled PT to increase their independence and safety with mobility to allow discharge.           If plan is discharge home, recommend the following: A little help with walking and/or transfers;A little help with bathing/dressing/bathroom;Assistance with cooking/housework;Assist for transportation;Help with stairs or ramp for entrance   Can travel by private vehicle   No    Equipment Recommendations None recommended by PT  Recommendations for Other Services  OT consult    Functional Status Assessment Patient has had a recent decline in their functional status and demonstrates the ability to make significant improvements in function in a reasonable and  predictable amount of time.     Precautions / Restrictions Precautions Precautions: Fall;Back Precaution Booklet Issued: No Recall of Precautions/Restrictions: Impaired Precaution/Restrictions Comments: has had multiple falls. Educated pt's re: BLT for pain control Restrictions Weight Bearing Restrictions Per Provider Order: No      Mobility  Bed Mobility Overal bed mobility: Needs Assistance Bed Mobility: Supine to Sit     Supine to sit: Contact guard     General bed mobility comments: close guarding as coming to EOB with vc's for log rolling first to manage pain.    Transfers Overall transfer level: Needs assistance Equipment used: Rolling walker (2 wheels) Transfers: Sit to/from Stand Sit to Stand: Min assist, From elevated surface           General transfer comment: needed bed elevated. Min A to steady and for power up    Ambulation/Gait Ambulation/Gait assistance: Min assist Gait Distance (Feet): 20 Feet Assistive device: Rolling walker (2 wheels) Gait Pattern/deviations: Step-through pattern Gait velocity: decreased Gait velocity interpretation: <1.31 ft/sec, indicative of household ambulator   General Gait Details: very short step length with decreased step height, esp R foot which was shuflling on floor. When cued to try to pick up feet he was able on the R but not the L. LE's shaking during ambulation.  Stairs            Wheelchair Mobility     Tilt Bed    Modified Rankin (Stroke Patients Only)       Balance Overall balance assessment: Needs assistance Sitting-balance support: Feet supported, Single extremity supported Sitting balance-Leahy Scale: Fair  Sitting balance - Comments: able to cross R ankle over L knee to put on show, posterior lean with increased effort to prevent falling bkwds   Standing balance support: Bilateral upper extremity supported, During functional activity Standing balance-Leahy Scale: Poor Standing balance  comment: heavily reliant on RW                             Pertinent Vitals/Pain Pain Assessment Pain Assessment: Faces Faces Pain Scale: Hurts little more Pain Location: lower scapular region bilaterally Pain Descriptors / Indicators: Constant, Sore, Aching Pain Intervention(s): Limited activity within patient's tolerance, Monitored during session    Home Living Family/patient expects to be discharged to:: Private residence Living Arrangements: Alone Available Help at Discharge: Family;Available PRN/intermittently Type of Home: Apartment Home Access: Elevator       Home Layout: One level Home Equipment: Rollator (4 wheels);Cane - single point      Prior Function Prior Level of Function : Independent/Modified Independent;Driving             Mobility Comments: lives independently and drives, cooks, but has had multiple falls and son reports balance has been worse since toe amps. Has transportation to HD ADLs Comments: independent     Extremity/Trunk Assessment   Upper Extremity Assessment Upper Extremity Assessment: Generalized weakness    Lower Extremity Assessment Lower Extremity Assessment: Generalized weakness;LLE deficits/detail LLE Deficits / Details: amputations 1st and 2nd toes. Strength grossly 3/5, LE's shake when up in standing and feels like knees could buckle. Has neuropathy LLE Sensation: decreased proprioception;history of peripheral neuropathy LLE Coordination: decreased gross motor    Cervical / Trunk Assessment Cervical / Trunk Assessment: Normal  Communication   Communication Communication: Impaired Factors Affecting Communication: Hearing impaired;Reduced clarity of speech    Cognition Arousal: Alert Behavior During Therapy: WFL for tasks assessed/performed   PT - Cognitive impairments: Memory, Problem solving, Sequencing, Awareness                       PT - Cognition Comments: pt has been receiving pain meds and  hallucinating, seeing bugs on the floor. This is not his baseline. He is also slow to respond and has some difficulty sequencing with poor insight into deficits. Unsure how far off baseline he is with this. Pt getting confused about whether he's in [place] or [place]. Following commands: Impaired Following commands impaired: Follows one step commands with increased time     Cueing Cueing Techniques: Verbal cues, Gestural cues     General Comments General comments (skin integrity, edema, etc.): VSS.    Exercises     Assessment/Plan    PT Assessment Patient needs continued PT services  PT Problem List Decreased strength;Decreased activity tolerance;Decreased balance;Decreased mobility;Decreased cognition;Decreased knowledge of use of DME;Decreased safety awareness;Decreased knowledge of precautions;Pain;Impaired sensation       PT Treatment Interventions DME instruction;Gait training;Functional mobility training;Therapeutic activities;Therapeutic exercise;Balance training;Neuromuscular re-education;Cognitive remediation;Patient/family education    PT Goals (Current goals can be found in the Care Plan section)  Acute Rehab PT Goals Patient Stated Goal: return home PT Goal Formulation: With patient/family Time For Goal Achievement: 03/30/23 Potential to Achieve Goals: Good    Frequency Min 2X/week     Co-evaluation               AM-PAC PT "6 Clicks" Mobility  Outcome Measure Help needed turning from your back to your side while in a flat bed without using bedrails?:  A Little Help needed moving from lying on your back to sitting on the side of a flat bed without using bedrails?: A Little Help needed moving to and from a bed to a chair (including a wheelchair)?: A Little Help needed standing up from a chair using your arms (e.g., wheelchair or bedside chair)?: A Little Help needed to walk in hospital room?: A Lot Help needed climbing 3-5 steps with a railing? : Total 6 Click Score:  15    End of Session Equipment Utilized During Treatment: Gait belt Activity Tolerance: Patient tolerated treatment well Patient left: in chair;with call bell/phone within reach;with chair alarm set Nurse Communication: Mobility status PT Visit Diagnosis: Unsteadiness on feet (R26.81);Repeated falls (R29.6);Muscle weakness (generalized) (M62.81);Pain Pain - part of body:  (upper back)    Time: 1610-9604 PT Time Calculation (min) (ACUTE ONLY): 34 min   Charges:   PT Evaluation $PT Eval Moderate Complexity: 1 Mod PT Treatments $Gait Training: 8-22 mins PT General Charges $$ ACUTE PT VISIT: 1 Visit         Lyanne Co, PT  Acute Rehab Services Secure chat preferred Office [phone]   Lawana Chambers Jaqua Ching 03/16/2023, 12:59 PM

## 2023-03-16 NOTE — Progress Notes (Signed)
 Duane Ortiz  WUJ:[id number] DOB: August 16, 1950 DOA: 03/14/2023 PCP: Charlane Ferretti, DO    Brief Narrative:  73 year old with a history of ESRD on HD, gout, HTN, PE, and DM2 who presented to the ER 3/11 with complaints of low back pain after having suffered a fall 4-5 days prior.  The patient had not attended his last 2 HD sessions due to his pain.  Lumbar spine x-rays in the ER revealed anterior osteophytes but no acute fracture or subluxation.  Goals of Care:   Code Status: Full Code   DVT prophylaxis: heparin injection 5,000 Units Start: 03/15/23 1600  Interim Hx: No acute events recorded overnight.  Afebrile.  Vital signs stable.  Resting comfortably in bedside chair eating lunch.  Has no new complaints.  Assessment & Plan:  Low back pain following mechanical fall Plain films of the lumbar spine revealed no acute findings - CT of the lumbar spine revealed no acute findings and noted generalized spondylitic spurring most bulky at L3-4 and L4-5 - CT pelvis without acute findings - PT/OT  ESRD on HD TTS Care per Nephrology - missed multiple outpatient appointments prior to his admission - utilizes an AV graft -underwent an off schedule extra HD treatment yesterday -due for his usual scheduled treatment today -BUN remains quite elevated  Uncontrolled HTN - volume overload BP elevated at time of presentation in setting of multiple missed HD appointments - adjusted medical therapy - monitor with ongoing dialysis treatments  Anemia of ESRD Erythropoietin and iron at discretion of Nephrology  DM2 Does not appear to require chronic home medical therapy -monitor CBG  Gout Continue usual allopurinol -quiescent  History of pulmonary embolism Does not appear to require chronic anticoagulation at this time   Family Communication: No family present Disposition: Appears SNF placement for rehab stay will be most appropriate disposition   Objective: Blood pressure (!) 155/71, pulse 62,  temperature (!) 97.5 F (36.4 C), temperature source Oral, resp. rate 18, height 5\' 11"  (1.803 m), weight 88 kg, SpO2 97%.  Intake/Output Summary (Last 24 hours) at 03/16/2023 1015 Last data filed at 03/16/2023 9562 Gross per 24 hour  Intake 240 ml  Output 3000 ml  Net -2760 ml   Filed Weights   03/14/23 1019 03/15/23 0825 03/15/23 1230  Weight: 87.1 kg 91.1 kg 88 kg    Examination: General: No acute respiratory distress Lungs: Clear to auscultation bilaterally without wheezes or crackles Cardiovascular: Regular rate and rhythm without murmur gallop or rub normal S1 and S2 Abdomen: Nontender, nondistended, soft, bowel sounds positive, no rebound, no ascites, no appreciable mass Extremities: No significant cyanosis, clubbing, or edema bilateral lower extremities  CBC: Recent Labs  Lab 03/14/23 1030 03/15/23 0406  WBC 8.2 6.7  NEUTROABS 6.3  --   HGB 13.5 13.4  HCT 42.4 41.0  MCV 88.5 86.0  PLT 185 171   Basic Metabolic Panel: Recent Labs  Lab 03/14/23 1220 03/14/23 1518 03/15/23 0406  NA 140  --  137  K 4.6  --  4.5  CL 98  --  96*  CO2 17*  --  17*  GLUCOSE 82  --  105*  BUN 107*  --  115*  CREATININE 19.84*  --  20.23*  CALCIUM 8.5*  --  8.6*  PHOS  --  >30.0*  --    GFR: Estimated Creatinine Clearance: 3.4 mL/min (A) (by C-G formula based on SCr of 20.23 mg/dL (H)).   Scheduled Meds:  allopurinol  100 mg Oral QHS  amLODipine  10 mg Oral QHS   calcitRIOL  1 mcg Oral Q T,Th,Sat-1800   Chlorhexidine Gluconate Cloth  6 each Topical Q0600   cinacalcet  30 mg Oral Q T,Th,Sat-1800   cloNIDine  0.1 mg Oral TID   ferric citrate  420 mg Oral TID WC   heparin injection (subcutaneous)  5,000 Units Subcutaneous Q8H   hydrALAZINE  25 mg Oral Q8H   irbesartan  150 mg Oral Daily   nebivolol  10 mg Oral QHS   nicotine  21 mg Transdermal Daily   pantoprazole  40 mg Oral Daily   sodium chloride flush  3 mL Intravenous Q12H   tamsulosin  0.4 mg Oral QHS     LOS: 2  days   Lonia Blood, MD [facility] Office  [phone] Pager - Text Page per Loretha Stapler  If 7PM-7AM, please contact night-coverage per Amion 03/16/2023, 10:15 AM

## 2023-03-16 NOTE — Progress Notes (Signed)
   03/16/23 1844  Vitals  Pulse Rate 66  Resp 13  BP (!) 169/71  SpO2 99 %  O2 Device Room Air  Oxygen Therapy  Patient Activity (if Appropriate) In bed  Pulse Oximetry Type Continuous  Oximetry Probe Site Changed No  During Treatment Monitoring  Blood Flow Rate (mL/min) 0 mL/min  Arterial Pressure (mmHg) 0.61 mmHg  Venous Pressure (mmHg) -2.83 mmHg  TMP (mmHg) -53.13 mmHg  Ultrafiltration Rate (mL/min) 1132 mL/min  Dialysate Flow Rate (mL/min) 300 ml/min  Dialysate Potassium Concentration 3  Dialysate Calcium Concentration 2.5  Duration of HD Treatment -hour(s) 3.5 hour(s)  Cumulative Fluid Removed (mL) per Treatment  3000.23  HD Safety Checks Performed Yes  Intra-Hemodialysis Comments Tx completed   Received patient in bed to unit.  Alert and oriented.  Informed consent signed and in chart.   TX duration: 3.5 hours  Patient tolerated well.  Transported back to the room  Alert, without acute distress.  Hand-off given to patient's nurse.   Access used: LAVG Access issues: None  Total UF removed: 3000 Medication(s) given: None    Mar Daring, LPN  [facility]

## 2023-03-16 NOTE — Progress Notes (Signed)
  [facility] Progress Note   Subjective:  Dialysis yesterday -net UF 3L  Seen in room. Confused about what day it is. Doesn't remember going to dialysis yesterday.   Objective Vitals:   03/15/23 1730 03/15/23 2102 03/16/23 0532 03/16/23 0730  BP: (!) 165/72 (!) 157/91 (!) 171/66 (!) 155/71  Pulse:  66 64 62  Resp:  18 18 18   Temp:  97.6 F (36.4 C) 97.7 F (36.5 C) (!) 97.5 F (36.4 C)  TempSrc:   Oral Oral  SpO2:  100% 100% 97%  Weight:      Height:         Additional Objective Labs: Basic Metabolic Panel: Recent Labs  Lab 03/14/23 1220 03/14/23 1518 03/15/23 0406  NA 140  --  137  K 4.6  --  4.5  CL 98  --  96*  CO2 17*  --  17*  GLUCOSE 82  --  105*  BUN 107*  --  115*  CREATININE 19.84*  --  20.23*  CALCIUM 8.5*  --  8.6*  PHOS  --  >30.0*  --    CBC: Recent Labs  Lab 03/14/23 1030 03/15/23 0406  WBC 8.2 6.7  NEUTROABS 6.3  --   HGB 13.5 13.4  HCT 42.4 41.0  MCV 88.5 86.0  PLT 185 171   Blood Culture No results found for: "SDES", "SPECREQUEST", "CULT", "REPTSTATUS"   Physical Exam General: Alert, nad  Heart: RRR Lungs: Clear normal wob Abdomen: soft no masses Extremities: Trace LE edema  Dialysis Access: LUE AVF +bruit   Medications:   allopurinol  100 mg Oral QHS   amLODipine  10 mg Oral QHS   calcitRIOL  1 mcg Oral Q T,Th,Sat-1800   Chlorhexidine Gluconate Cloth  6 each Topical Q0600   cinacalcet  30 mg Oral Q T,Th,Sat-1800   cloNIDine  0.1 mg Oral TID   ferric citrate  420 mg Oral TID WC   heparin injection (subcutaneous)  5,000 Units Subcutaneous Q8H   hydrALAZINE  25 mg Oral Q8H   irbesartan  150 mg Oral Daily   nebivolol  10 mg Oral QHS   nicotine  21 mg Transdermal Daily   pantoprazole  40 mg Oral Daily   sodium chloride flush  3 mL Intravenous Q12H   tamsulosin  0.4 mg Oral QHS    Outpatient dialysis unit: [facility] dialysis prescription: Had treatment on 3/4, missed 3/8, 3/6, 3/1 F180, BFR 800, 2K,  2.5 Ca, 4hrs, EDW 89.5, LUE AVG, no esa, calcitriol w/ treatments, sensipar 30mg  TTS, auryxia 2 tabs w/ meals  Assessment/Plan: ESRD -  HD TTS. Noncompliant and missed multiple treatments over the last few days. HD off schedule Wed.. Back on schedule today  Back pain - Related to fall. Per primary  HTN/volume-  BP elevated with volume overload. Continue home meds  UF as able.  Anemia-  Hgb above goal. 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 Calcitriol, Sensipar, Auryxia binder.  Phos >30 on admit.   Tomasa Blase PA-C [facility] 03/16/2023,10:09 AM

## 2023-03-17 DIAGNOSIS — N186 End stage renal disease: Secondary | ICD-10-CM | POA: Diagnosis not present

## 2023-03-17 DIAGNOSIS — M545 Low back pain, unspecified: Secondary | ICD-10-CM | POA: Diagnosis not present

## 2023-03-17 LAB — RENAL FUNCTION PANEL
Albumin: 2.7 g/dL — ABNORMAL LOW (ref 3.5–5.0)
Anion gap: 13 (ref 5–15)
BUN: 40 mg/dL — ABNORMAL HIGH (ref 8–23)
CO2: 26 mmol/L (ref 22–32)
Calcium: 8.2 mg/dL — ABNORMAL LOW (ref 8.9–10.3)
Chloride: 95 mmol/L — ABNORMAL LOW (ref 98–111)
Creatinine, Ser: 9.17 mg/dL — ABNORMAL HIGH (ref 0.61–1.24)
GFR, Estimated: 5 mL/min — ABNORMAL LOW (ref >60)
Glucose, Bld: 86 mg/dL (ref 70–99)
Phosphorus: 3.4 mg/dL (ref 2.5–4.6)
Potassium: 4 mmol/L (ref 3.5–5.1)
Sodium: 134 mmol/L — ABNORMAL LOW (ref 135–145)

## 2023-03-17 LAB — GLUCOSE, CAPILLARY
Glucose-Capillary: 89 mg/dL (ref 70–99)
Glucose-Capillary: 109 mg/dL — ABNORMAL HIGH (ref 70–99)
Glucose-Capillary: 151 mg/dL — ABNORMAL HIGH (ref 70–99)

## 2023-03-17 LAB — CBC
HCT: 40 % (ref 39.0–52.0)
Hemoglobin: 13 g/dL (ref 13.0–17.0)
MCH: 28.4 pg (ref 26.0–34.0)
MCHC: 32.5 g/dL (ref 30.0–36.0)
MCV: 87.5 fL (ref 80.0–100.0)
Platelets: 126 10*3/uL — ABNORMAL LOW (ref 150–400)
RBC: 4.57 MIL/uL (ref 4.22–5.81)
RDW: 18.6 % — ABNORMAL HIGH (ref 11.5–15.5)
WBC: 6.7 10*3/uL (ref 4.0–10.5)
nRBC: 0 % (ref 0.0–0.2)

## 2023-03-17 MED ORDER — CHLORHEXIDINE GLUCONATE CLOTH 2 % EX PADS
6.0000 | MEDICATED_PAD | Freq: Every day | CUTANEOUS | Status: DC
  Administered 2023-03-17 – 2023-03-20 (×4): 6 via TOPICAL

## 2023-03-17 MED ORDER — CLONIDINE HCL 0.1 MG PO TABS
0.1000 mg | ORAL_TABLET | Freq: Two times a day (BID) | ORAL | Status: DC
Start: 2023-03-17 — End: 2023-03-23
  Administered 2023-03-17 – 2023-03-22 (×9): 0.1 mg via ORAL
  Filled 2023-03-17 (×9): qty 1

## 2023-03-17 NOTE — Progress Notes (Signed)
 Duane Ortiz  [id number]:[id number] DOB: 09/16/1950 DOA: 03/14/2023 PCP: Charlane Ferretti, DO    Brief Narrative:  73 year old with a history of ESRD on HD, gout, HTN, PE, and DM2 who presented to the ER 3/11 with complaints of low back pain after having suffered a fall 4-5 days prior.  The patient had not attended his last 2 HD sessions due to his pain.  Lumbar spine x-rays in the ER revealed anterior osteophytes but no acute fracture or subluxation.  Goals of Care:   Code Status: Full Code   DVT prophylaxis: heparin injection 5,000 Units Start: 03/15/23 1600  Interim Hx: Afebrile.  Vital signs stable.  No acute events reported overnight.  BUN now falling nicely.  More alert and interactive today.  Denies any new complaints.  States back pain is well-controlled.  Assessment & Plan:  Low back pain following mechanical fall Plain films of the lumbar spine revealed no acute findings - CT of the lumbar spine revealed no acute findings and noted generalized spondylitic spurring most bulky at L3-4 and L4-5 - CT pelvis without acute findings - PT/OT suggest SNF rehab stay  ESRD on HD TTS Care per Nephrology - missed multiple outpatient appointments prior to his admission - utilizes an AV graft -underwent an off schedule extra HD treatment yesterday -due for his usual scheduled treatment today -BUN now improving rapidly  Uncontrolled HTN - volume overload BP elevated at time of presentation in setting of multiple missed HD appointments - adjusted medical therapy -BP now well-controlled after multiple dialysis treatments  Anemia of ESRD Erythropoietin and iron at discretion of Nephrology  DM2 Does not appear to require chronic home medical therapy -CBG controlled  Gout Continue usual allopurinol -quiescent  History of pulmonary embolism Does not appear to require chronic anticoagulation at this time   Family Communication: No family present Disposition: Awaiting SNF  placement   Objective: Blood pressure (!) 90/48, pulse 61, temperature 98.7 F (37.1 C), resp. rate 18, height 5\' 11"  (1.803 m), weight 88.7 kg, SpO2 100%.  Intake/Output Summary (Last 24 hours) at 03/17/2023 0936 Last data filed at 03/17/2023 0830 Gross per 24 hour  Intake 240 ml  Output 3000 ml  Net -2760 ml   Filed Weights   03/15/23 0825 03/15/23 1230 03/16/23 1450  Weight: 91.1 kg 88 kg 88.7 kg    Examination: General: No acute respiratory distress Lungs: Clear to auscultation bilaterally without wheezes or crackles Cardiovascular: Regular rate and rhythm without murmur gallop or rub normal S1 and S2 Abdomen: NT/ND, soft, BS+, no mass  Extremities: No significant cyanosis, clubbing, or edema bilateral lower extremities  CBC: Recent Labs  Lab 03/14/23 1030 03/15/23 0406 03/17/23 0633  WBC 8.2 6.7 6.7  NEUTROABS 6.3  --   --   HGB 13.5 13.4 13.0  HCT 42.4 41.0 40.0  MCV 88.5 86.0 87.5  PLT 185 171 126*   Basic Metabolic Panel: Recent Labs  Lab 03/14/23 1220 03/14/23 1518 03/15/23 0406 03/17/23 0633  NA 140  --  137 134*  K 4.6  --  4.5 4.0  CL 98  --  96* 95*  CO2 17*  --  17* 26  GLUCOSE 82  --  105* 86  BUN 107*  --  115* 40*  CREATININE 19.84*  --  20.23* 9.17*  CALCIUM 8.5*  --  8.6* 8.2*  PHOS  --  >30.0*  --  3.4   GFR: Estimated Creatinine Clearance: 7.4 mL/min (A) (by C-G formula based on  SCr of 9.17 mg/dL (H)).   Scheduled Meds:  allopurinol  100 mg Oral QHS   amLODipine  10 mg Oral QHS   calcitRIOL  1 mcg Oral Q T,Th,Sat-1800   Chlorhexidine Gluconate Cloth  6 each Topical Q0600   cinacalcet  30 mg Oral Q T,Th,Sat-1800   cloNIDine  0.1 mg Oral TID   ferric citrate  420 mg Oral TID WC   heparin injection (subcutaneous)  5,000 Units Subcutaneous Q8H   hydrALAZINE  25 mg Oral Q8H   irbesartan  150 mg Oral Daily   nebivolol  10 mg Oral QHS   nicotine  21 mg Transdermal Daily   pantoprazole  40 mg Oral Daily   QUEtiapine  12.5 mg Oral  QHS   sodium chloride flush  3 mL Intravenous Q12H   tamsulosin  0.4 mg Oral QHS     LOS: 3 days   Lonia Blood, MD [facility] Office  [phone] Pager - Text Page per Loretha Stapler  If 7PM-7AM, please contact night-coverage per Amion 03/17/2023, 9:36 AM

## 2023-03-17 NOTE — [place] FL2 (Signed)
 [place] MEDICAID FL2 LEVEL OF CARE FORM     IDENTIFICATION  Patient Name: Duane Ortiz Birthdate: 07-19-1950 Sex: male Admission Date (Current Location): 03/14/2023  [facility] and [place] Number:  Producer, television/film/video and Address:  The . [facility], [address] [place], [place] [address]      Provider Number: [id number]  Attending Physician Name and Address:  Lonia Blood, MD  Relative Name and Phone Number:       Current Level of Care: Hospital Recommended Level of Care: Skilled Nursing Facility Prior Approval Number:    Date Approved/Denied:   PASRR Number: [id number]  Discharge Plan: SNF    Current Diagnoses: Patient Active Problem List   Diagnosis Date Noted   Fall 03/14/2023   Full code status 03/14/2023   Palliative care by specialist    ESRD (end stage renal disease) on dialysis (HCC) 09/21/2019   Hyperkalemia, diminished renal excretion 09/21/2019   Anemia 08/28/2019   Disorder of mineral metabolism, unspecified 08/28/2019   Hyperkalemia 08/28/2019   Hypomagnesemia 08/28/2019   Metabolic acidosis 08/28/2019   Mycosis 08/28/2019   Tinea pedis 08/28/2019   Peripheral neuropathy 04/02/2019   Coagulation defect (HCC) 07/12/2018   History of pulmonary embolism 07/12/2018   Insomnia 07/12/2018   Lumbar disc disease with radiculopathy 07/12/2018   GERD (gastroesophageal reflux disease) 04/24/2018   Allergic rhinitis 03/01/2018   Unstable gait 03/01/2018   Chronic lower back pain 02/13/2018   Gout involving toe of left foot 02/13/2018   History of alcohol abuse 02/13/2018   Overweight (BMI 25.0-29.9) 02/13/2018   Vitamin D deficiency 02/13/2018   Essential hypertension 04/27/2017   Type 2 diabetes mellitus with renal complication (HCC) 04/27/2017   Gait abnormality 10/11/2016   Diabetic peripheral neuropathy (HCC) 08/25/2016   Disc degeneration, lumbar 08/25/2016   Spinal stenosis of lumbar region with neurogenic  claudication 08/25/2016   Dyspnea on exertion 05/22/2015   Hypercholesteremia    Gout    Chronic kidney disease    Hypercholesterolemia     Orientation RESPIRATION BLADDER Height & Weight     Self, Time, Situation, Place  Normal Continent Weight: 195 lb 8.8 oz (88.7 kg) Height:  5\' 11"  (180.3 cm)  BEHAVIORAL SYMPTOMS/MOOD NEUROLOGICAL BOWEL NUTRITION STATUS      Continent Diet (please see discharge summary)  AMBULATORY STATUS COMMUNICATION OF NEEDS Skin   Limited Assist Verbally Normal                       Personal Care Assistance Level of Assistance  Bathing, Feeding, Dressing Bathing Assistance: Limited assistance Feeding assistance: Independent Dressing Assistance: Limited assistance     Functional Limitations Info  Sight, Hearing, Speech Sight Info: Adequate Hearing Info: Adequate Speech Info: Adequate    SPECIAL CARE FACTORS FREQUENCY  PT (By licensed PT), OT (By licensed OT)     PT Frequency: 5x per week OT Frequency: 5x per week            Contractures Contractures Info: Not present    Additional Factors Info  Code Status, Allergies Code Status Info: FULL Allergies Info: Darvon,Baclofen,Gabapentin,Sulfa Antibiotics           Current Medications (03/17/2023):  This is the current hospital active medication list Current Facility-Administered Medications  Medication Dose Route Frequency Provider Last Rate Last Admin   acetaminophen (TYLENOL) tablet 650 mg  650 mg Oral Q6H PRN Lonia Blood, MD       allopurinol (ZYLOPRIM) tablet  100 mg  100 mg Oral QHS Irena Cords V, MD   100 mg at 03/16/23 2132   amLODipine (NORVASC) tablet 10 mg  10 mg Oral QHS Lonia Blood, MD   10 mg at 03/16/23 2133   calcitRIOL (ROCALTROL) capsule 1 mcg  1 mcg Oral Q T,Th,Sat-1800 Darnell Level, MD   1 mcg at 03/14/23 1828   Chlorhexidine Gluconate Cloth 2 % PADS 6 each  6 each Topical Q0600 Oretha Milch, PA-C   6 each at 03/17/23 1217   cinacalcet  (SENSIPAR) tablet 30 mg  30 mg Oral Q T,Th,Sat-1800 Darnell Level, MD   30 mg at 03/14/23 1828   cloNIDine (CATAPRES) tablet 0.1 mg  0.1 mg Oral BID Lonia Blood, MD       ferric citrate (AURYXIA) tablet 420 mg  420 mg Oral TID WC Darnell Level, MD   420 mg at 03/17/23 1217   heparin injection 5,000 Units  5,000 Units Subcutaneous Q8H Lonia Blood, MD   5,000 Units at 03/17/23 0525   hydrALAZINE (APRESOLINE) tablet 25 mg  25 mg Oral Q8H Lonia Blood, MD   25 mg at 03/17/23 0524   hydrALAZINE (APRESOLINE) tablet 25 mg  25 mg Oral Q6H PRN Lonia Blood, MD       HYDROmorphone (DILAUDID) injection 0.5-1 mg  0.5-1 mg Intravenous Q3H PRN Lonia Blood, MD       irbesartan (AVAPRO) tablet 150 mg  150 mg Oral Daily Irena Cords V, MD   150 mg at 03/17/23 0844   nebivolol (BYSTOLIC) tablet 10 mg  10 mg Oral QHS Irena Cords V, MD   10 mg at 03/16/23 2137   nicotine (NICODERM CQ - dosed in mg/24 hours) patch 21 mg  21 mg Transdermal Daily Irena Cords V, MD   21 mg at 03/14/23 1718   pantoprazole (PROTONIX) EC tablet 40 mg  40 mg Oral Daily Jetty Duhamel T, MD   40 mg at 03/17/23 0844   QUEtiapine (SEROQUEL) tablet 12.5 mg  12.5 mg Oral QHS Jetty Duhamel T, MD   12.5 mg at 03/16/23 2137   sodium chloride flush (NS) 0.9 % injection 3 mL  3 mL Intravenous Q12H Irena Cords V, MD   3 mL at 03/17/23 1218   tamsulosin (FLOMAX) capsule 0.4 mg  0.4 mg Oral QHS Gertha Calkin, MD   0.4 mg at 03/16/23 2133     Discharge Medications: Please see discharge summary for a list of discharge medications.  Relevant Imaging Results:  Relevant Lab Results:   Additional Information SSN [id number]  dialysis [facility] TTS 6:30am  Eduard Roux, [place]

## 2023-03-17 NOTE — Plan of Care (Signed)

## 2023-03-17 NOTE — Progress Notes (Signed)
 Mobility Specialist Progress Note:    03/17/23 0900  Mobility  Activity Ambulated with assistance in room  Level of Assistance Minimal assist, patient does 75% or more  Assistive Device Front wheel walker  Distance Ambulated (ft) 40 ft  Activity Response Tolerated well  Mobility Referral Yes  Mobility visit 1 Mobility  Mobility Specialist Start Time (ACUTE ONLY) 0850  Mobility Specialist Stop Time (ACUTE ONLY) 0859  Mobility Specialist Time Calculation (min) (ACUTE ONLY) 9 min   Pt received in bed and agreeable. Required minA to come EOB and stand. Ambulated w/ minG. No complaints throughout. Pt left in chair with call bell and all needs met. Chair alarm on.  [place]'Vante Earlene Plater Mobility Specialist Please contact via Special educational needs teacher or Rehab office at [phone]

## 2023-03-17 NOTE — Progress Notes (Signed)
 Duane Ortiz Progress Note   Subjective:   Pt sleeping, awakens to voice. Denies SOB, CP, dizziness, nausea. States "I'm just really sleepy."  Objective Vitals:   03/16/23 1844 03/16/23 1850 03/16/23 2048 03/17/23 0533  BP: (!) 169/71 (!) 171/77 (!) 136/113 (!) 155/60  Pulse: 66 66 74 64  Resp: 13 13 19 18   Temp:   98.7 F (37.1 C) 98.7 F (37.1 C)  TempSrc:      SpO2: 99% 99% 100% 100%  Weight:      Height:       Physical Exam General: Alert male in NAD Heart: RRR, no murmurs, rubs or gallops Lungs: CTA anteriorly, respirations unlabored on RA Abdomen: Soft, non-distended, +BS Extremities: trace edema bilateral lower extremities Dialysis Access:  LUE AVF + t/b  Additional Objective Labs: Basic Metabolic Panel: Recent Labs  Lab 03/14/23 1220 03/14/23 1518 03/15/23 0406 03/17/23 0633  NA 140  --  137 134*  K 4.6  --  4.5 4.0  CL 98  --  96* 95*  CO2 17*  --  17* 26  GLUCOSE 82  --  105* 86  BUN 107*  --  115* 40*  CREATININE 19.84*  --  20.23* 9.17*  CALCIUM 8.5*  --  8.6* 8.2*  PHOS  --  >30.0*  --  3.4   Liver Function Tests: Recent Labs  Lab 03/14/23 1220 03/15/23 0406 03/17/23 0633  AST 58* 48*  --   ALT 90* 82*  --   ALKPHOS 78 88  --   BILITOT 1.3* 0.9  --   PROT 6.5 6.5  --   ALBUMIN 3.0* 3.0* 2.7*   No results for input(s): "LIPASE", "AMYLASE" in the last 168 hours. CBC: Recent Labs  Lab 03/14/23 1030 03/15/23 0406 03/17/23 0633  WBC 8.2 6.7 6.7  NEUTROABS 6.3  --   --   HGB 13.5 13.4 13.0  HCT 42.4 41.0 40.0  MCV 88.5 86.0 87.5  PLT 185 171 126*   Blood Culture No results found for: "SDES", "SPECREQUEST", "CULT", "REPTSTATUS"  Cardiac Enzymes: No results for input(s): "CKTOTAL", "CKMB", "CKMBINDEX", "TROPONINI" in the last 168 hours. CBG: Recent Labs  Lab 03/17/23 0717  GLUCAP 89   Iron Studies: No results for input(s): "IRON", "TIBC", "TRANSFERRIN", "FERRITIN" in the last 72  hours. @lablastinr3 @ Studies/Results: No results found. Medications:   allopurinol  100 mg Oral QHS   amLODipine  10 mg Oral QHS   calcitRIOL  1 mcg Oral Q T,Th,Sat-1800   Chlorhexidine Gluconate Cloth  6 each Topical Q0600   cinacalcet  30 mg Oral Q T,Th,Sat-1800   cloNIDine  0.1 mg Oral TID   ferric citrate  420 mg Oral TID WC   heparin injection (subcutaneous)  5,000 Units Subcutaneous Q8H   hydrALAZINE  25 mg Oral Q8H   irbesartan  150 mg Oral Daily   nebivolol  10 mg Oral QHS   nicotine  21 mg Transdermal Daily   pantoprazole  40 mg Oral Daily   QUEtiapine  12.5 mg Oral QHS   sodium chloride flush  3 mL Intravenous Q12H   tamsulosin  0.4 mg Oral QHS    Outpatient Dialysis Orders: [facility] Had treatment on 3/4, missed 3/8, 3/6, 3/1 F180, BFR 800, 2K, 2.5 Ca, 4hrs, EDW 89.5, LUE AVG, no esa, calcitriol w/ treatments, sensipar 30mg  TTS, auryxia 2 tabs w/ meals  Assessment/Plan: ESRD -  HD TTS. Noncompliant and missed multiple treatments over the last few days.  HD off schedule Wed, now back on TTS schedule, HD tomorrow.  Back pain - Related to fall. Per primary  HTN/volume-  BP elevated with volume overload. Continue home meds  UF as able.  Anemia-  Hgb above goal. 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 Continue home meds. Calcitriol, Sensipar, Auryxia binder.  Phos >30 on admit, now down to 3.4.   Rogers Blocker, PA-C 03/17/2023, 8:58 AM  [facility] Pager: [phone]

## 2023-03-17 NOTE — TOC Initial Note (Signed)
 Transition of Care [facility]) - Initial/Assessment Note    Patient Details  Name: Duane Ortiz MRN: [id number] Date of Birth: February 16, 1950  [facility] CM/SW Contact:    Eduard Roux, LCSW Phone Number: 03/17/2023, 12:55 PM  Clinical Narrative:              CSW met with patient- CSW introduced self and explained role. CSW discuss with patient therapy recommendation for hort term rehab at [facility]. Patient states he agrees with recommendations and is agreeable to SNF placement. CSW explained the SNF process. No preferred SNF at this time.   Patient states he uses [facility] transport to dialysis appointments.  TOC will provide bed offers once available.   Antony Blackbird, MSW, LCSW Clinical Social Worker         Expected Discharge Plan: Skilled Nursing Facility Barriers to Discharge: Continued Medical Work up, SNF Pending bed offer   Patient Goals and CMS Choice            Expected Discharge Plan and Services In-house Referral: Clinical Social Work     Living arrangements for the past 2 months: Apartment                                      Prior Living Arrangements/Services Living arrangements for the past 2 months: Apartment Lives with:: Self Patient language and need for interpreter reviewed:: No        Need for Family Participation in Patient Care: Yes (Comment)     Criminal Activity/Legal Involvement Pertinent to Current Situation/Hospitalization: No - Comment as needed  Activities of Daily Living   ADL Screening (condition at time of admission) Independently performs ADLs?: Yes (appropriate for developmental age) Is the patient deaf or have difficulty hearing?: No Does the patient have difficulty seeing, even when wearing glasses/contacts?: No Does the patient have difficulty concentrating, remembering, or making decisions?: No  Permission Sought/Granted Permission sought to share information with : Family Supports Permission granted to  share information with : Yes, Verbal Permission Granted  Share Information with NAME: Duane Ortiz  Permission granted to share info w AGENCY: SNFs  Permission granted to share info w Relationship: son  Permission granted to share info w Contact Information: [phone]  Emotional Assessment Appearance:: Appears stated age Attitude/Demeanor/Rapport: Engaged Affect (typically observed): Accepting, Appropriate Orientation: : Oriented to Self, Oriented to Place, Oriented to  Time, Oriented to Situation Alcohol / Substance Use: Not Applicable Psych Involvement: No (comment)  Admission diagnosis:  Fall [W19.XXXA] ESRD (end stage renal disease) (HCC) [N18.6] Acute low back pain without sciatica, unspecified back pain laterality [M54.50] Patient Active Problem List   Diagnosis Date Noted   Fall 03/14/2023   Full code status 03/14/2023   Palliative care by specialist    ESRD (end stage renal disease) on dialysis (HCC) 09/21/2019   Hyperkalemia, diminished renal excretion 09/21/2019   Anemia 08/28/2019   Disorder of mineral metabolism, unspecified 08/28/2019   Hyperkalemia 08/28/2019   Hypomagnesemia 08/28/2019   Metabolic acidosis 08/28/2019   Mycosis 08/28/2019   Tinea pedis 08/28/2019   Peripheral neuropathy 04/02/2019   Coagulation defect (HCC) 07/12/2018   History of pulmonary embolism 07/12/2018   Insomnia 07/12/2018   Lumbar disc disease with radiculopathy 07/12/2018   GERD (gastroesophageal reflux disease) 04/24/2018   Allergic rhinitis 03/01/2018   Unstable gait 03/01/2018   Chronic lower back pain 02/13/2018   Gout involving toe of left  foot 02/13/2018   History of alcohol abuse 02/13/2018   Overweight (BMI 25.0-29.9) 02/13/2018   Vitamin D deficiency 02/13/2018   Essential hypertension 04/27/2017   Type 2 diabetes mellitus with renal complication (HCC) 04/27/2017   Gait abnormality 10/11/2016   Diabetic peripheral neuropathy (HCC) 08/25/2016   Disc degeneration,  lumbar 08/25/2016   Spinal stenosis of lumbar region with neurogenic claudication 08/25/2016   Dyspnea on exertion 05/22/2015   Hypercholesteremia    Gout    Chronic kidney disease    Hypercholesterolemia    PCP:  Charlane Ferretti, DO Pharmacy:   [facility] - [place] ([place]), [place] - [address]  ([place]) [place] [address] Phone: [phone] Fax: [phone]     Social Drivers of Health (SDOH) Social History: SDOH Screenings   Food Insecurity: No Food Insecurity (03/14/2023)  Housing: Low Risk  (03/14/2023)  Transportation Needs: No Transportation Needs (03/14/2023)  Utilities: Not At Risk (03/14/2023)  Social Connections: Socially Isolated (03/14/2023)  Tobacco Use: High Risk (03/14/2023)   SDOH Interventions:     Readmission Risk Interventions     No data to display

## 2023-03-18 DIAGNOSIS — N186 End stage renal disease: Secondary | ICD-10-CM | POA: Diagnosis not present

## 2023-03-18 DIAGNOSIS — M545 Low back pain, unspecified: Secondary | ICD-10-CM | POA: Diagnosis not present

## 2023-03-18 LAB — CBC
HCT: 38.4 % — ABNORMAL LOW (ref 39.0–52.0)
Hemoglobin: 12.4 g/dL — ABNORMAL LOW (ref 13.0–17.0)
MCH: 28.6 pg (ref 26.0–34.0)
MCHC: 32.3 g/dL (ref 30.0–36.0)
MCV: 88.5 fL (ref 80.0–100.0)
Platelets: 154 10*3/uL (ref 150–400)
RBC: 4.34 MIL/uL (ref 4.22–5.81)
RDW: 18.3 % — ABNORMAL HIGH (ref 11.5–15.5)
WBC: 7.1 10*3/uL (ref 4.0–10.5)
nRBC: 0 % (ref 0.0–0.2)

## 2023-03-18 LAB — RENAL FUNCTION PANEL
Albumin: 2.6 g/dL — ABNORMAL LOW (ref 3.5–5.0)
Anion gap: 12 (ref 5–15)
BUN: 57 mg/dL — ABNORMAL HIGH (ref 8–23)
CO2: 23 mmol/L (ref 22–32)
Calcium: 8.1 mg/dL — ABNORMAL LOW (ref 8.9–10.3)
Chloride: 97 mmol/L — ABNORMAL LOW (ref 98–111)
Creatinine, Ser: 11.36 mg/dL — ABNORMAL HIGH (ref 0.61–1.24)
GFR, Estimated: 4 mL/min — ABNORMAL LOW (ref >60)
Glucose, Bld: 117 mg/dL — ABNORMAL HIGH (ref 70–99)
Phosphorus: 2.3 mg/dL — ABNORMAL LOW (ref 2.5–4.6)
Potassium: 4 mmol/L (ref 3.5–5.1)
Sodium: 132 mmol/L — ABNORMAL LOW (ref 135–145)

## 2023-03-18 LAB — GLUCOSE, CAPILLARY
Glucose-Capillary: 87 mg/dL (ref 70–99)
Glucose-Capillary: 101 mg/dL — ABNORMAL HIGH (ref 70–99)
Glucose-Capillary: 86 mg/dL (ref 70–99)

## 2023-03-18 NOTE — Progress Notes (Signed)
 Duane Ortiz  WUJ:[id number] DOB: Jul 14, 1950 DOA: 03/14/2023 PCP: Charlane Ferretti, DO    Brief Narrative:  73 year old with a history of ESRD on HD, gout, HTN, PE, and DM2 who presented to the ER 3/11 with complaints of low back pain after having suffered a fall 4-5 days prior.  The patient had not attended his last 2 HD sessions due to his pain.  Lumbar spine x-rays in the ER revealed anterior osteophytes but no acute fracture or subluxation.  Goals of Care:   Code Status: Full Code   DVT prophylaxis: heparin injection 5,000 Units Start: 03/15/23 1600  Interim Hx: No acute events recorded since the time of my last visit.  Afebrile.  Vital signs stable.  The patient is seen in the dialysis unit.  He is resting comfortably in bed tolerating his treatment without difficulty.  He has no new complaints.  Assessment & Plan:  Low back pain following mechanical fall Plain films of the lumbar spine revealed no acute findings - CT of the lumbar spine revealed no acute findings and noted generalized spondylitic spurring most bulky at L3-4 and L4-5 - CT pelvis without acute findings - PT/OT suggest SNF rehab stay  ESRD on HD TTS Care per Nephrology - missed multiple outpatient appointments prior to his admission - utilizes an AV graft -underwent an off schedule extra HD treatment yesterday -due for his usual scheduled treatment today -BUN now improving rapidly  Uncontrolled HTN - volume overload BP elevated at time of presentation in setting of multiple missed HD appointments - adjusted medical therapy -BP now well-controlled after multiple dialysis treatments  Anemia of ESRD Erythropoietin and iron at discretion of Nephrology  DM2 Does not appear to require chronic home medical therapy -CBG controlled  Gout Continue usual allopurinol -quiescent  History of pulmonary embolism Does not appear to require chronic anticoagulation at this time   Family Communication: No family  present Disposition: Awaiting SNF placement -medically ready for discharge when bed available   Objective: Blood pressure (!) 144/66, pulse 67, temperature 98.2 F (36.8 C), temperature source Oral, resp. rate 18, height 5\' 11"  (1.803 m), weight 88.7 kg, SpO2 99%.  Intake/Output Summary (Last 24 hours) at 03/18/2023 0937 Last data filed at 03/17/2023 1411 Gross per 24 hour  Intake 120 ml  Output 0 ml  Net 120 ml   Filed Weights   03/15/23 0825 03/15/23 1230 03/16/23 1450  Weight: 91.1 kg 88 kg 88.7 kg    Examination: General: No acute respiratory distress Lungs: Clear to auscultation  Cardiovascular: Regular rate and rhythm  Abdomen: NT/ND, soft, BS+, no mass  Extremities: No significant edema  CBC: Recent Labs  Lab 03/14/23 1030 03/15/23 0406 03/17/23 0633  WBC 8.2 6.7 6.7  NEUTROABS 6.3  --   --   HGB 13.5 13.4 13.0  HCT 42.4 41.0 40.0  MCV 88.5 86.0 87.5  PLT 185 171 126*   Basic Metabolic Panel: Recent Labs  Lab 03/14/23 1220 03/14/23 1518 03/15/23 0406 03/17/23 0633  NA 140  --  137 134*  K 4.6  --  4.5 4.0  CL 98  --  96* 95*  CO2 17*  --  17* 26  GLUCOSE 82  --  105* 86  BUN 107*  --  115* 40*  CREATININE 19.84*  --  20.23* 9.17*  CALCIUM 8.5*  --  8.6* 8.2*  PHOS  --  >30.0*  --  3.4   GFR: Estimated Creatinine Clearance: 7.4 mL/min (A) (by C-G  formula based on SCr of 9.17 mg/dL (H)).   Scheduled Meds:  allopurinol  100 mg Oral QHS   amLODipine  10 mg Oral QHS   calcitRIOL  1 mcg Oral Q T,Th,Sat-1800   Chlorhexidine Gluconate Cloth  6 each Topical Q0600   cinacalcet  30 mg Oral Q T,Th,Sat-1800   cloNIDine  0.1 mg Oral BID   ferric citrate  420 mg Oral TID WC   heparin injection (subcutaneous)  5,000 Units Subcutaneous Q8H   hydrALAZINE  25 mg Oral Q8H   irbesartan  150 mg Oral Daily   nebivolol  10 mg Oral QHS   nicotine  21 mg Transdermal Daily   pantoprazole  40 mg Oral Daily   QUEtiapine  12.5 mg Oral QHS   sodium chloride flush  3  mL Intravenous Q12H   tamsulosin  0.4 mg Oral QHS     LOS: 4 days   Lonia Blood, MD [facility] Office  [phone] Pager - Text Page per Loretha Stapler  If 7PM-7AM, please contact night-coverage per Amion 03/18/2023, 9:37 AM

## 2023-03-18 NOTE — Progress Notes (Signed)
 [facility] Progress Note   Subjective:   Sitting on bedside, eating breakfast. Reports he still has some pain in his side due to his fall. Looking forward to working with PT. Denies SOB, CP, dizziness, nausea.   Objective Vitals:   03/17/23 1713 03/17/23 1941 03/18/23 0425 03/18/23 0756  BP: (!) 148/65 137/64 (!) 169/77 (!) 144/66  Pulse: 67 66 66 67  Resp: 18  17 18   Temp:  (!) 97.4 F (36.3 C) 97.7 F (36.5 C) 98.2 F (36.8 C)  TempSrc:  Oral Oral Oral  SpO2:  100% 100% 99%  Weight:      Height:       Physical Exam General: Alert male in NAD Heart: RRR, no murmurs, rubs or gallops Lungs: CTA anteriorly, respirations unlabored on RA Abdomen: Soft, non-distended, +BS Extremities: 1+ edema bilateral lower extremities Dialysis Access:  LUE AVF + t/b  Additional Objective Labs: Basic Metabolic Panel: Recent Labs  Lab 03/14/23 1220 03/14/23 1518 03/15/23 0406 03/17/23 0633  NA 140  --  137 134*  K 4.6  --  4.5 4.0  CL 98  --  96* 95*  CO2 17*  --  17* 26  GLUCOSE 82  --  105* 86  BUN 107*  --  115* 40*  CREATININE 19.84*  --  20.23* 9.17*  CALCIUM 8.5*  --  8.6* 8.2*  PHOS  --  >30.0*  --  3.4   Liver Function Tests: Recent Labs  Lab 03/14/23 1220 03/15/23 0406 03/17/23 0633  AST 58* 48*  --   ALT 90* 82*  --   ALKPHOS 78 88  --   BILITOT 1.3* 0.9  --   PROT 6.5 6.5  --   ALBUMIN 3.0* 3.0* 2.7*   No results for input(s): "LIPASE", "AMYLASE" in the last 168 hours. CBC: Recent Labs  Lab 03/14/23 1030 03/15/23 0406 03/17/23 0633  WBC 8.2 6.7 6.7  NEUTROABS 6.3  --   --   HGB 13.5 13.4 13.0  HCT 42.4 41.0 40.0  MCV 88.5 86.0 87.5  PLT 185 171 126*   CBG: Recent Labs  Lab 03/17/23 0717 03/17/23 1700 03/17/23 1947 03/18/23 0744  GLUCAP 89 109* 151* 87    Medications:   allopurinol  100 mg Oral QHS   amLODipine  10 mg Oral QHS   calcitRIOL  1 mcg Oral Q T,Th,Sat-1800   Chlorhexidine Gluconate Cloth  6 each Topical Q0600    cinacalcet  30 mg Oral Q T,Th,Sat-1800   cloNIDine  0.1 mg Oral BID   ferric citrate  420 mg Oral TID WC   heparin injection (subcutaneous)  5,000 Units Subcutaneous Q8H   hydrALAZINE  25 mg Oral Q8H   irbesartan  150 mg Oral Daily   nebivolol  10 mg Oral QHS   nicotine  21 mg Transdermal Daily   pantoprazole  40 mg Oral Daily   QUEtiapine  12.5 mg Oral QHS   sodium chloride flush  3 mL Intravenous Q12H   tamsulosin  0.4 mg Oral QHS    Outpatient Dialysis Orders: Had treatment on 3/4, missed 3/8, 3/6, 3/1 F180, BFR 800, 2K, 2.5 Ca, 4hrs, EDW 89.5, LUE AVG, no esa, calcitriol w/ treatments, sensipar 30mg  TTS, auryxia 2 tabs w/ meals  Assessment/Plan: ESRD -  HD TTS. Noncompliant and missed multiple treatments over the last few days. HD off schedule Wed, now back on TTS schedule, HD today.  Back pain - Related to fall. Per primary  HTN/volume-  BP elevated with volume overload. Continue home meds  UF as able.  Anemia-  Hgb above goal. 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 Continue home meds. Calcitriol, Sensipar, Auryxia binder.  Phos >30 on admit, now down to 3.4.    Rogers Blocker, PA-C 03/18/2023, 9:10 AM  [facility] Pager: [phone]

## 2023-03-18 NOTE — Progress Notes (Signed)
   03/18/23 1432  Vitals  Temp 98.1 F (36.7 C)  Pulse Rate 66  Resp 13  BP (!) 153/68  SpO2 100 %  O2 Device Room Air  Oxygen Therapy  Patient Activity (if Appropriate) In bed  Pulse Oximetry Type Continuous  Oximetry Probe Site Changed No  During Treatment Monitoring  Blood Flow Rate (mL/min) 349 mL/min  Arterial Pressure (mmHg) -193.12 mmHg  Venous Pressure (mmHg) 277.16 mmHg  TMP (mmHg) 2.63 mmHg  Ultrafiltration Rate (mL/min) 1187 mL/min  Dialysate Flow Rate (mL/min) 300 ml/min  Duration of HD Treatment -hour(s) 3.47 hour(s)  Cumulative Fluid Removed (mL) per Treatment  2969.92  HD Safety Checks Performed Yes  Intra-Hemodialysis Comments Tx completed   Received patient in bed to unit.  Alert and oriented.  Informed consent signed and in chart.   TX duration: 3.5 hours  Patient tolerated well.  Transported back to the room  Alert, without acute distress.  Hand-off given to patient's nurse.   Access used: LAVG Access issues: None  Total UF removed: 3000 Medication(s) given: None    Mar Daring, LPN  [facility]

## 2023-03-19 DIAGNOSIS — N186 End stage renal disease: Secondary | ICD-10-CM | POA: Diagnosis not present

## 2023-03-19 DIAGNOSIS — M545 Low back pain, unspecified: Secondary | ICD-10-CM | POA: Diagnosis not present

## 2023-03-19 LAB — GLUCOSE, CAPILLARY
Glucose-Capillary: 115 mg/dL — ABNORMAL HIGH (ref 70–99)
Glucose-Capillary: 88 mg/dL (ref 70–99)
Glucose-Capillary: 98 mg/dL (ref 70–99)

## 2023-03-19 MED ORDER — FERRIC CITRATE 1 GM 210 MG(FE) PO TABS
210.0000 mg | ORAL_TABLET | Freq: Three times a day (TID) | ORAL | Status: DC
  Administered 2023-03-19 – 2023-03-22 (×9): 210 mg via ORAL
  Filled 2023-03-19 (×9): qty 1

## 2023-03-19 NOTE — Progress Notes (Signed)
 [facility] Progress Note   Subjective:   Tolerated HD yesterday with 3L UF. Reports feeling well today. Denies SOB, CP, dizziness, nausea.   Objective Vitals:   03/18/23 1538 03/18/23 2042 03/19/23 0433 03/19/23 0816  BP: 111/73 133/63 (!) 134/50 (!) 141/64  Pulse: 69 71 69 71  Resp: 18 18 18 18   Temp: (!) 97.5 F (36.4 C) 98.3 F (36.8 C) 98.5 F (36.9 C)   TempSrc: Oral Oral    SpO2: 100% 94% 97% 93%  Weight:      Height:       Physical Exam General: Alert male in NAD Heart: RRR, no murmurs, rubs or gallops Lungs: CTA anteriorly, respirations unlabored on RA Abdomen: Soft, non-distended, +BS Extremities: trace edema bilateral lower extremities Dialysis Access:  LUE AVF + t/b  Additional Objective Labs: Basic Metabolic Panel: Recent Labs  Lab 03/14/23 1518 03/15/23 0406 03/17/23 0633 03/18/23 0915  NA  --  137 134* 132*  K  --  4.5 4.0 4.0  CL  --  96* 95* 97*  CO2  --  17* 26 23  GLUCOSE  --  105* 86 117*  BUN  --  115* 40* 57*  CREATININE  --  20.23* 9.17* 11.36*  CALCIUM  --  8.6* 8.2* 8.1*  PHOS >30.0*  --  3.4 2.3*   Liver Function Tests: Recent Labs  Lab 03/14/23 1220 03/15/23 0406 03/17/23 0633 03/18/23 0915  AST 58* 48*  --   --   ALT 90* 82*  --   --   ALKPHOS 78 88  --   --   BILITOT 1.3* 0.9  --   --   PROT 6.5 6.5  --   --   ALBUMIN 3.0* 3.0* 2.7* 2.6*   No results for input(s): "LIPASE", "AMYLASE" in the last 168 hours. CBC: Recent Labs  Lab 03/14/23 1030 03/15/23 0406 03/17/23 0633 03/18/23 0915  WBC 8.2 6.7 6.7 7.1  NEUTROABS 6.3  --   --   --   HGB 13.5 13.4 13.0 12.4*  HCT 42.4 41.0 40.0 38.4*  MCV 88.5 86.0 87.5 88.5  PLT 185 171 126* 154   Blood Culture No results found for: "SDES", "SPECREQUEST", "CULT", "REPTSTATUS"  Cardiac Enzymes: No results for input(s): "CKTOTAL", "CKMB", "CKMBINDEX", "TROPONINI" in the last 168 hours. CBG: Recent Labs  Lab 03/17/23 1947 03/18/23 0744 03/18/23 1540  03/18/23 2044 03/19/23 0817  GLUCAP 151* 87 101* 86 115*   Iron Studies: No results for input(s): "IRON", "TIBC", "TRANSFERRIN", "FERRITIN" in the last 72 hours. @lablastinr3 @ Studies/Results: No results found. Medications:   allopurinol  100 mg Oral QHS   amLODipine  10 mg Oral QHS   calcitRIOL  1 mcg Oral Q T,Th,Sat-1800   Chlorhexidine Gluconate Cloth  6 each Topical Q0600   cinacalcet  30 mg Oral Q T,Th,Sat-1800   cloNIDine  0.1 mg Oral BID   ferric citrate  420 mg Oral TID WC   heparin injection (subcutaneous)  5,000 Units Subcutaneous Q8H   hydrALAZINE  25 mg Oral Q8H   irbesartan  150 mg Oral Daily   nebivolol  10 mg Oral QHS   nicotine  21 mg Transdermal Daily   pantoprazole  40 mg Oral Daily   QUEtiapine  12.5 mg Oral QHS   sodium chloride flush  3 mL Intravenous Q12H   tamsulosin  0.4 mg Oral QHS    Outpatient Dialysis Orders: Had treatment on 3/4, missed 3/8, 3/6, 3/1 F180, BFR 800,  2K, 2.5 Ca, 4hrs, EDW 89.5, LUE AVG, no esa, calcitriol w/ treatments, sensipar 30mg  TTS, auryxia 2 tabs w/ meals  Assessment/Plan: ESRD -  HD TTS. Noncompliant and missed multiple treatments over the last few days. HD off schedule Wed, now back on TTS schedule Back pain - Related to fall. Per primary  HTN/volume-  BP initially elevated with volume overload. Improving with HD, edema much improved Anemia-  Hgb above goal. 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 Continue home meds. Calcitriol, Sensipar, Auryxia binder.  Phos >30 on admit, now down to 2.3. Reduced auryxia to 1 tab per meal, likely compliance issues outpatient   Rogers Blocker, PA-C 03/19/2023, 9:13 AM  [facility] Pager: [phone]

## 2023-03-19 NOTE — Progress Notes (Signed)
 Duane Ortiz  WJX:[id number] DOB: 04-11-1950 DOA: 03/14/2023 PCP: Charlane Ferretti, DO    Brief Narrative:  73 year old with a history of ESRD on HD, gout, HTN, PE, and DM2 who presented to the ER 3/11 with complaints of low back pain after having suffered a fall 4-5 days prior.  The patient had not attended his last 2 HD sessions due to his pain.  Lumbar spine x-rays in the ER revealed anterior osteophytes but no acute fracture or subluxation.  Goals of Care:   Code Status: Full Code   DVT prophylaxis: heparin injection 5,000 Units Start: 03/15/23 1600  Interim Hx: No acute events reported overnight.  Afebrile.  Vital signs stable.  Resting comfortably in bed.  Reports soreness of his buttocks from being in bed.  Denies chest pain or shortness of breath.  Assessment & Plan:  Low back pain following mechanical fall Plain films of the lumbar spine revealed no acute findings - CT of the lumbar spine revealed no acute findings and noted generalized spondylitic spurring most bulky at L3-4 and L4-5 - CT pelvis without acute findings - PT/OT suggest SNF rehab stay -awaiting bed for placement at this time  ESRD on HD TTS Care per Nephrology - missed multiple outpatient appointments prior to his admission - utilizes an AV graft -required additional HD treatments off schedule initially due to noncompliance as an outpatient but now has resumed usual schedule treatments without difficulty  Uncontrolled HTN - volume overload BP elevated at time of presentation in setting of multiple missed HD appointments - adjusted medical therapy -BP now well-controlled after multiple dialysis treatments  Anemia of ESRD Erythropoietin and iron at discretion of Nephrology  DM2 Does not appear to require chronic home medical therapy - CBG controlled  Gout Continue usual allopurinol -quiescent  History of pulmonary embolism Does not appear to require chronic anticoagulation at this time   Family  Communication: Spoke with the patient's son at bedside Disposition: Awaiting SNF placement -medically ready for discharge when bed available   Objective: Blood pressure (!) 141/64, pulse 71, temperature 98.5 F (36.9 C), resp. rate 18, height 5\' 11"  (1.803 m), weight 84.8 kg, SpO2 93%.  Intake/Output Summary (Last 24 hours) at 03/19/2023 0944 Last data filed at 03/18/2023 2042 Gross per 24 hour  Intake 300 ml  Output 3000 ml  Net -2700 ml   Filed Weights   03/15/23 1230 03/16/23 1450 03/18/23 1020  Weight: 88 kg 88.7 kg 84.8 kg    Examination: General: No acute respiratory distress Lungs: Clear to auscultation  Cardiovascular: Regular rate and rhythm  Abdomen: NT/ND, soft, BS+, no mass  Extremities: No significant edema  CBC: Recent Labs  Lab 03/14/23 1030 03/15/23 0406 03/17/23 0633 03/18/23 0915  WBC 8.2 6.7 6.7 7.1  NEUTROABS 6.3  --   --   --   HGB 13.5 13.4 13.0 12.4*  HCT 42.4 41.0 40.0 38.4*  MCV 88.5 86.0 87.5 88.5  PLT 185 171 126* 154   Basic Metabolic Panel: Recent Labs  Lab 03/14/23 1518 03/15/23 0406 03/17/23 0633 03/18/23 0915  NA  --  137 134* 132*  K  --  4.5 4.0 4.0  CL  --  96* 95* 97*  CO2  --  17* 26 23  GLUCOSE  --  105* 86 117*  BUN  --  115* 40* 57*  CREATININE  --  20.23* 9.17* 11.36*  CALCIUM  --  8.6* 8.2* 8.1*  PHOS >30.0*  --  3.4 2.3*  GFR: Estimated Creatinine Clearance: 6 mL/min (A) (by C-G formula based on SCr of 11.36 mg/dL (H)).   Scheduled Meds:  allopurinol  100 mg Oral QHS   amLODipine  10 mg Oral QHS   calcitRIOL  1 mcg Oral Q T,Th,Sat-1800   Chlorhexidine Gluconate Cloth  6 each Topical Q0600   cinacalcet  30 mg Oral Q T,Th,Sat-1800   cloNIDine  0.1 mg Oral BID   ferric citrate  210 mg Oral TID WC   heparin injection (subcutaneous)  5,000 Units Subcutaneous Q8H   hydrALAZINE  25 mg Oral Q8H   irbesartan  150 mg Oral Daily   nebivolol  10 mg Oral QHS   nicotine  21 mg Transdermal Daily   pantoprazole  40  mg Oral Daily   QUEtiapine  12.5 mg Oral QHS   sodium chloride flush  3 mL Intravenous Q12H   tamsulosin  0.4 mg Oral QHS     LOS: 5 days   Lonia Blood, MD [facility] Office  [phone] Pager - Text Page per Loretha Stapler  If 7PM-7AM, please contact night-coverage per Amion 03/19/2023, 9:44 AM

## 2023-03-19 NOTE — Plan of Care (Signed)
 Patient AAOx2, disoriented to place and time. No complaints of pain. LUA AVF. LBM today. Safety precautions maintained.    Problem: Education: Goal: Knowledge of General Education information will improve Description: Including pain rating scale, medication(s)/side effects and non-pharmacologic comfort measures Outcome: Progressing   Problem: Health Behavior/Discharge Planning: Goal: Ability to manage health-related needs will improve Outcome: Progressing   Problem: Nutrition: Goal: Adequate nutrition will be maintained Outcome: Progressing

## 2023-03-19 NOTE — Progress Notes (Signed)
 Mobility Specialist Progress Note:    03/19/23 1534  Mobility  Activity Ambulated with assistance in room;Transferred to/from BSC  Level of Assistance Moderate assist, patient does 50-74%  Assistive Device Other (Comment) (HHA)  Distance Ambulated (ft) 20 ft  Activity Response Tolerated well  Mobility Referral Yes  Mobility visit 1 Mobility  Mobility Specialist Start Time (ACUTE ONLY) 1500  Mobility Specialist Stop Time (ACUTE ONLY) 1515  Mobility Specialist Time Calculation (min) (ACUTE ONLY) 15 min   Pt received in bed agreeable to mobility. ModA for STS and contact guard for ambulation.Was able to ambulate around room then requested to use the [facility]. Left on BSC, instructed to use call light when finished. NT aware. All needs met.  Thompson Grayer Mobility Specialist  Please contact vis Secure Chat or  Rehab Office [phone]

## 2023-03-20 DIAGNOSIS — N186 End stage renal disease: Secondary | ICD-10-CM | POA: Diagnosis not present

## 2023-03-20 DIAGNOSIS — M545 Low back pain, unspecified: Secondary | ICD-10-CM | POA: Diagnosis not present

## 2023-03-20 LAB — GLUCOSE, CAPILLARY
Glucose-Capillary: 106 mg/dL — ABNORMAL HIGH (ref 70–99)
Glucose-Capillary: 73 mg/dL (ref 70–99)

## 2023-03-20 MED ORDER — CHLORHEXIDINE GLUCONATE CLOTH 2 % EX PADS: 6.0000 | MEDICATED_PAD | Freq: Every day | CUTANEOUS | Status: DC

## 2023-03-20 NOTE — Care Management Important Message (Signed)
 Important Message  Patient Details  Name: Duane Ortiz MRN: [id number] Date of Birth: 10/26/1950   Important Message Given:  Yes - Medicare IM     Dorena Bodo 03/20/2023, 2:58 PM

## 2023-03-20 NOTE — TOC Progression Note (Signed)
 Transition of Care [facility]) - Progression Note    Patient Details  Name: Jeremia Groot MRN: [id number] Date of Birth: [birth year]  [facility]) CM/SW Contact  Marliss Coots, LCSW Phone Number: 03/20/2023, 4:04 PM  Clinical Narrative:     4:04 PM CSW introduced herself and role to patient at bedside. Patient's guest was also present at bedside. Patient consented CSW to speak in front of guest. CSW provided SNF options ([facility], [address], [place], [place], [facility]) with [employer].  Expected Discharge Plan: Skilled Nursing Facility Barriers to Discharge: Continued Medical Work up, SNF Pending bed offer  Expected Discharge Plan and Services In-house Referral: Clinical Social Work     Living arrangements for the past 2 months: Apartment                                       Social Determinants of Health (SDOH) Interventions SDOH Screenings   Food Insecurity: No Food Insecurity (03/14/2023)  Housing: Low Risk  (03/14/2023)  Transportation Needs: No Transportation Needs (03/14/2023)  Utilities: Not At Risk (03/14/2023)  Social Connections: Socially Isolated (03/14/2023)  Tobacco Use: High Risk (03/14/2023)    Readmission Risk Interventions     No data to display

## 2023-03-20 NOTE — Progress Notes (Signed)
 Physical Therapy Treatment Patient Details Name: Duane Ortiz MRN: [id number] DOB: [birth year] Today's Date: 03/20/2023   History of Present Illness Pt is 73 yo male who presents on 03/14/23 with fall and then missing 3 HD treatments due to back pain from that fall. Xray of lumbar spine revealed anterior osteophytes but no acute fx or subluxation. PMH: ESRD on HD, DM2, PE, peripheral neuropathy, L toe amps 1 and 2.    PT Comments  Pt progressing towards physical therapy goals. Was able to perform transfers and ambulation with gross CGA to min assist and RW for support. Pt able to make corrective changes with cues and improve floor clearance and heel strike. Pt's main complaint was itching and dry skin - asking for lotion. Lotion provided and pt was able to reach down to apply lotion to his legs without difficulty or reported pain. Will continue to follow and progress as able per POC.     If plan is discharge home, recommend the following: A little help with walking and/or transfers;A little help with bathing/dressing/bathroom;Assistance with cooking/housework;Assist for transportation;Help with stairs or ramp for entrance   Can travel by private vehicle     No  Equipment Recommendations  None recommended by PT    Recommendations for Other Services OT consult     Precautions / Restrictions Precautions Precautions: Fall;Back Precaution Booklet Issued: No Recall of Precautions/Restrictions: Impaired Precaution/Restrictions Comments: has had multiple falls. Educated pt's re: BLT for pain control Restrictions Weight Bearing Restrictions Per Provider Order: No     Mobility  Bed Mobility Overal bed mobility: Needs Assistance Bed Mobility: Supine to Sit     Supine to sit: Contact guard     General bed mobility comments: Pt transitioned to EOB without assistance. Increased time and effort required.    Transfers Overall transfer level: Needs assistance Equipment used: Rolling  walker (2 wheels) Transfers: Sit to/from Stand Sit to Stand: Min assist, Contact guard assist           General transfer comment: Initially pt required assist to stand however by end of session pt progressed to CGA.    Ambulation/Gait Ambulation/Gait assistance: Contact guard assist Gait Distance (Feet): 100 Feet Assistive device: Rolling walker (2 wheels) Gait Pattern/deviations: Step-through pattern Gait velocity: decreased Gait velocity interpretation: <1.31 ft/sec, indicative of household ambulator   General Gait Details: Short, shuffling steps initially. Pt able to make corrective changes with cues and improved step height and heel strike, but was not able to maintain due to fatigue.   Stairs             Wheelchair Mobility     Tilt Bed    Modified Rankin (Stroke Patients Only)       Balance Overall balance assessment: Needs assistance Sitting-balance support: Feet supported, Single extremity supported Sitting balance-Leahy Scale: Fair     Standing balance support: Bilateral upper extremity supported, During functional activity Standing balance-Leahy Scale: Poor Standing balance comment: heavily reliant on RW                            Communication Communication Communication: Impaired Factors Affecting Communication: Hearing impaired;Reduced clarity of speech  Cognition Arousal: Alert Behavior During Therapy: WFL for tasks assessed/performed   PT - Cognitive impairments: Memory, Problem solving, Sequencing, Awareness                       PT - Cognition Comments: pt has been receiving  pain meds and hallucinating, seeing bugs on the floor. This is not his baseline. He is also slow to respond and has some difficulty sequencing with poor insight into deficits. Unsure how far off baseline he is with this. Pt getting confused about whether he's in [name] or [name]. Following commands: Impaired Following commands impaired: Follows one step  commands with increased time    Cueing Cueing Techniques: Verbal cues, Gestural cues  Exercises Other Exercises Other Exercises: 5x sit to stand time: 32.34 seconds    General Comments        Pertinent Vitals/Pain Pain Assessment Faces Pain Scale: Hurts little more Pain Location: lower scapular region bilaterally Pain Descriptors / Indicators: Constant, Sore, Aching    Home Living                          Prior Function            PT Goals (current goals can now be found in the care plan section) Acute Rehab PT Goals Patient Stated Goal: return home PT Goal Formulation: With patient/family Time For Goal Achievement: 03/30/23 Potential to Achieve Goals: Good Progress towards PT goals: Progressing toward goals    Frequency    Min 2X/week      PT Plan      Co-evaluation              AM-PAC PT "6 Clicks" Mobility   Outcome Measure  Help needed turning from your back to your side while in a flat bed without using bedrails?: A Little Help needed moving from lying on your back to sitting on the side of a flat bed without using bedrails?: A Little Help needed moving to and from a bed to a chair (including a wheelchair)?: A Little Help needed standing up from a chair using your arms (e.g., wheelchair or bedside chair)?: A Little Help needed to walk in hospital room?: A Little Help needed climbing 3-5 steps with a railing? : A Lot 6 Click Score: 17    End of Session Equipment Utilized During Treatment: Gait belt Activity Tolerance: Patient tolerated treatment well Patient left: in chair;with call bell/phone within reach;with chair alarm set Nurse Communication: Mobility status PT Visit Diagnosis: Unsteadiness on feet (R26.81);Repeated falls (R29.6);Muscle weakness (generalized) (M62.81);Pain Pain - part of body:  (upper back)     Time: 1914-7829 PT Time Calculation (min) (ACUTE ONLY): 25 min  Charges:    $Gait Training: 8-22 mins $Self  Care/Home Management: 8-22 PT General Charges $$ ACUTE PT VISIT: 1 Visit                     Duane Ortiz, PT, DPT Acute Rehabilitation Services Secure Chat Preferred Office: [phone]    Duane Ortiz 03/20/2023, 1:18 PM

## 2023-03-20 NOTE — Progress Notes (Signed)
 [facility] Progress Note   Subjective:    Seen and examined patient at bedside. No acute issues. Next HD 3/18.  Objective Vitals:   03/19/23 1422 03/19/23 1639 03/19/23 1954 03/20/23 0430  BP: (!) 152/70 (!) 150/63 (!) 164/73 (!) 136/48  Pulse:  68 73 64  Resp:  18 18 18   Temp:   97.6 F (36.4 C) (!) 97.4 F (36.3 C)  TempSrc:   Oral Oral  SpO2:  100% 100% 100%  Weight:      Height:       Physical Exam General: Alert male in NAD Heart: RRR, no murmurs, rubs or gallops Lungs: CTA anteriorly, respirations unlabored on RA Abdomen: Soft, non-distended, +BS Extremities: trace edema bilateral lower extremities Dialysis Access:  LUE AVF + t/b  Filed Weights   03/15/23 1230 03/16/23 1450 03/18/23 1020  Weight: 88 kg 88.7 kg 84.8 kg    Intake/Output Summary (Last 24 hours) at 03/20/2023 1425 Last data filed at 03/19/2023 1954 Gross per 24 hour  Intake --  Output 0 ml  Net 0 ml    Additional Objective Labs: Basic Metabolic Panel: Recent Labs  Lab 03/14/23 1518 03/15/23 0406 03/17/23 0633 03/18/23 0915  NA  --  137 134* 132*  K  --  4.5 4.0 4.0  CL  --  96* 95* 97*  CO2  --  17* 26 23  GLUCOSE  --  105* 86 117*  BUN  --  115* 40* 57*  CREATININE  --  20.23* 9.17* 11.36*  CALCIUM  --  8.6* 8.2* 8.1*  PHOS >30.0*  --  3.4 2.3*   Liver Function Tests: Recent Labs  Lab 03/14/23 1220 03/15/23 0406 03/17/23 0633 03/18/23 0915  AST 58* 48*  --   --   ALT 90* 82*  --   --   ALKPHOS 78 88  --   --   BILITOT 1.3* 0.9  --   --   PROT 6.5 6.5  --   --   ALBUMIN 3.0* 3.0* 2.7* 2.6*   No results for input(s): "LIPASE", "AMYLASE" in the last 168 hours. CBC: Recent Labs  Lab 03/14/23 1030 03/15/23 0406 03/17/23 0633 03/18/23 0915  WBC 8.2 6.7 6.7 7.1  NEUTROABS 6.3  --   --   --   HGB 13.5 13.4 13.0 12.4*  HCT 42.4 41.0 40.0 38.4*  MCV 88.5 86.0 87.5 88.5  PLT 185 171 126* 154   Blood Culture No results found for: "SDES", "SPECREQUEST",  "CULT", "REPTSTATUS"  Cardiac Enzymes: No results for input(s): "CKTOTAL", "CKMB", "CKMBINDEX", "TROPONINI" in the last 168 hours. CBG: Recent Labs  Lab 03/18/23 2044 03/19/23 0817 03/19/23 1640 03/19/23 1957 03/20/23 0428  GLUCAP 86 115* 88 98 106*   Iron Studies: No results for input(s): "IRON", "TIBC", "TRANSFERRIN", "FERRITIN" in the last 72 hours. Lab Results  Component Value Date   INR 1.2 09/30/2019   Studies/Results: No results found.  Medications:   allopurinol  100 mg Oral QHS   amLODipine  10 mg Oral QHS   calcitRIOL  1 mcg Oral Q T,Th,Sat-1800   Chlorhexidine Gluconate Cloth  6 each Topical Q0600   cinacalcet  30 mg Oral Q T,Th,Sat-1800   cloNIDine  0.1 mg Oral BID   ferric citrate  210 mg Oral TID WC   heparin injection (subcutaneous)  5,000 Units Subcutaneous Q8H   hydrALAZINE  25 mg Oral Q8H   irbesartan  150 mg Oral Daily   nebivolol  10  mg Oral QHS   nicotine  21 mg Transdermal Daily   pantoprazole  40 mg Oral Daily   QUEtiapine  12.5 mg Oral QHS   sodium chloride flush  3 mL Intravenous Q12H   tamsulosin  0.4 mg Oral QHS    Dialysis Orders: Had treatment on 3/4, missed 3/8, 3/6, 3/1 F180, BFR 800, 2K, 2.5 Ca, 4hrs, EDW 89.5, LUE AVG, no esa, calcitriol w/ treatments, sensipar 30mg  TTS, auryxia 2 tabs w/ meals  Assessment/Plan: ESRD -  HD TTS. Noncompliant and missed multiple treatments over the last few days. HD off schedule Wed, now back on TTS schedule. Next HD 3/18. Back pain - Related to fall. Per primary  HTN/volume-  BP initially elevated with volume overload. Improving with HD, edema much improved Anemia-  Hgb above goal. 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 Continue home meds. Calcitriol, Sensipar, Auryxia binder.  Phos >30 on admit, now down to 2.3. Reduced auryxia to 1 tab per meal, likely compliance issues outpatient   Salome Holmes, NP [facility] 03/20/2023,2:25 PM  LOS: 6 days

## 2023-03-20 NOTE — Progress Notes (Signed)
 Duane Ortiz  ZOX:[id number] DOB: 1950-07-14 DOA: 03/14/2023 PCP: Charlane Ferretti, DO    Brief Narrative:  73 year old with a history of ESRD on HD, gout, HTN, PE, and DM2 who presented to the ER 3/11 with complaints of low back pain after having suffered a fall 4-5 days prior.  The patient had not attended his last 2 HD sessions due to his pain.  Lumbar spine x-rays in the ER revealed anterior osteophytes but no acute fracture or subluxation.  Goals of Care:   Code Status: Full Code   DVT prophylaxis: heparin injection 5,000 Units Start: 03/15/23 1600  Interim Hx: Afebrile.  Vital signs stable.  No acute events overnight.  Sitting up in his bedside chair eating lunch.  Denies any complaints.  Assessment & Plan:  Low back pain following mechanical fall Plain films of the lumbar spine revealed no acute findings - CT of the lumbar spine revealed no acute findings and noted generalized spondylitic spurring most bulky at L3-4 and L4-5 - CT pelvis without acute findings - PT/OT suggest SNF rehab stay -awaiting bed for placement at this time  ESRD on HD TTS Care per Nephrology - missed multiple outpatient appointments prior to his admission - utilizes an AV graft -required additional HD treatments off schedule initially due to noncompliance as an outpatient but now has resumed usual schedule treatments without difficulty  Uncontrolled HTN - volume overload BP elevated at time of presentation in setting of multiple missed HD appointments - adjusted medical therapy -BP now well-controlled after multiple dialysis treatments  Anemia of ESRD Erythropoietin and iron at discretion of Nephrology  DM2 Does not appear to require chronic home medical therapy - CBG controlled  Gout Continue usual allopurinol -quiescent  History of pulmonary embolism Does not appear to require chronic anticoagulation at this time   Family Communication: Spoke with the patient's son at bedside Disposition:  Awaiting SNF placement -remains medically ready for discharge when bed available   Objective: Blood pressure (!) 136/48, pulse 64, temperature (!) 97.4 F (36.3 C), temperature source Oral, resp. rate 18, height 5\' 11"  (1.803 m), weight 84.8 kg, SpO2 100%.  Intake/Output Summary (Last 24 hours) at 03/20/2023 0926 Last data filed at 03/19/2023 1954 Gross per 24 hour  Intake --  Output 0 ml  Net 0 ml   Filed Weights   03/15/23 1230 03/16/23 1450 03/18/23 1020  Weight: 88 kg 88.7 kg 84.8 kg    Examination: General: No acute respiratory distress Lungs: Clear to auscultation  Cardiovascular: Regular rate and rhythm  Abdomen: NT/ND, soft, BS+, no mass  Extremities: No significant edema  CBC: Recent Labs  Lab 03/14/23 1030 03/15/23 0406 03/17/23 0633 03/18/23 0915  WBC 8.2 6.7 6.7 7.1  NEUTROABS 6.3  --   --   --   HGB 13.5 13.4 13.0 12.4*  HCT 42.4 41.0 40.0 38.4*  MCV 88.5 86.0 87.5 88.5  PLT 185 171 126* 154   Basic Metabolic Panel: Recent Labs  Lab 03/14/23 1518 03/15/23 0406 03/17/23 0633 03/18/23 0915  NA  --  137 134* 132*  K  --  4.5 4.0 4.0  CL  --  96* 95* 97*  CO2  --  17* 26 23  GLUCOSE  --  105* 86 117*  BUN  --  115* 40* 57*  CREATININE  --  20.23* 9.17* 11.36*  CALCIUM  --  8.6* 8.2* 8.1*  PHOS >30.0*  --  3.4 2.3*   GFR: Estimated Creatinine Clearance: 6 mL/min (  A) (by C-G formula based on SCr of 11.36 mg/dL (H)).   Scheduled Meds:  allopurinol  100 mg Oral QHS   amLODipine  10 mg Oral QHS   calcitRIOL  1 mcg Oral Q T,Th,Sat-1800   Chlorhexidine Gluconate Cloth  6 each Topical Q0600   cinacalcet  30 mg Oral Q T,Th,Sat-1800   cloNIDine  0.1 mg Oral BID   ferric citrate  210 mg Oral TID WC   heparin injection (subcutaneous)  5,000 Units Subcutaneous Q8H   hydrALAZINE  25 mg Oral Q8H   irbesartan  150 mg Oral Daily   nebivolol  10 mg Oral QHS   nicotine  21 mg Transdermal Daily   pantoprazole  40 mg Oral Daily   QUEtiapine  12.5 mg Oral  QHS   sodium chloride flush  3 mL Intravenous Q12H   tamsulosin  0.4 mg Oral QHS     LOS: 6 days   Lonia Blood, MD [facility] Office  [phone] Pager - Text Page per Loretha Stapler  If 7PM-7AM, please contact night-coverage per Amion 03/20/2023, 9:26 AM

## 2023-03-20 NOTE — Progress Notes (Signed)
 Mobility Specialist Progress Note:    03/20/23 0900  Mobility  Activity Ambulated with assistance in hallway  Level of Assistance Minimal assist, patient does 75% or more  Assistive Device Front wheel walker  Distance Ambulated (ft) 80 ft  Activity Response Tolerated well  Mobility Referral Yes  Mobility visit 1 Mobility  Mobility Specialist Start Time (ACUTE ONLY) [id number]  Mobility Specialist Stop Time (ACUTE ONLY) 0847  Mobility Specialist Time Calculation (min) (ACUTE ONLY) 10 min   Pt received in bed and agreeable. Required minA to don shoes and STS. Ambulated in hall w/ minG. Taking small shuffle-like steps. No complaints throughout. Pt left on EOB with call bell and bed alarm on.  D'Vante Earlene Plater Mobility Specialist Please contact via Special educational needs teacher or Rehab office at [phone]

## 2023-03-21 DIAGNOSIS — M545 Low back pain, unspecified: Secondary | ICD-10-CM | POA: Diagnosis not present

## 2023-03-21 DIAGNOSIS — N186 End stage renal disease: Secondary | ICD-10-CM | POA: Diagnosis not present

## 2023-03-21 LAB — CBC WITH DIFFERENTIAL/PLATELET
Abs Immature Granulocytes: 0.04 10*3/uL (ref 0.00–0.07)
Basophils Absolute: 0 10*3/uL (ref 0.0–0.1)
Basophils Relative: 0 %
Eosinophils Absolute: 0.2 10*3/uL (ref 0.0–0.5)
Eosinophils Relative: 2 %
HCT: 40.9 % (ref 39.0–52.0)
Hemoglobin: 13.3 g/dL (ref 13.0–17.0)
Immature Granulocytes: 1 %
Lymphocytes Relative: 13 %
Lymphs Abs: 1.1 10*3/uL (ref 0.7–4.0)
MCH: 28.5 pg (ref 26.0–34.0)
MCHC: 32.5 g/dL (ref 30.0–36.0)
MCV: 87.6 fL (ref 80.0–100.0)
Monocytes Absolute: 1 10*3/uL (ref 0.1–1.0)
Monocytes Relative: 12 %
Neutro Abs: 6.1 10*3/uL (ref 1.7–7.7)
Neutrophils Relative %: 72 %
Platelets: 162 10*3/uL (ref 150–400)
RBC: 4.67 MIL/uL (ref 4.22–5.81)
RDW: 17.6 % — ABNORMAL HIGH (ref 11.5–15.5)
WBC: 8.4 10*3/uL (ref 4.0–10.5)
nRBC: 0 % (ref 0.0–0.2)

## 2023-03-21 MED ORDER — ALTEPLASE 2 MG IJ SOLR: 2.0000 mg | Freq: Once | INTRAMUSCULAR | Status: DC | PRN

## 2023-03-21 MED ORDER — PENTAFLUOROPROP-TETRAFLUOROETH EX AERO: 1.0000 | INHALATION_SPRAY | CUTANEOUS | Status: DC | PRN

## 2023-03-21 MED ORDER — LIDOCAINE-PRILOCAINE 2.5-2.5 % EX CREA: 1.0000 | TOPICAL_CREAM | CUTANEOUS | Status: DC | PRN

## 2023-03-21 MED ORDER — LIDOCAINE HCL (PF) 1 % IJ SOLN: 5.0000 mL | INTRAMUSCULAR | Status: DC | PRN

## 2023-03-21 MED ORDER — HEPARIN SODIUM (PORCINE) 1000 UNIT/ML DIALYSIS: 1000.0000 [IU] | INTRAMUSCULAR | Status: DC | PRN

## 2023-03-21 NOTE — Progress Notes (Signed)
   03/21/23 1839  Vitals  Temp (!) 97.5 F (36.4 C)  Pulse Rate 69  Resp 14  BP (!) 145/72  SpO2 100 %  O2 Device Room Air  Oxygen Therapy  Patient Activity (if Appropriate) In bed  Pulse Oximetry Type Continuous  During Treatment Monitoring  Blood Flow Rate (mL/min) 399 mL/min  Arterial Pressure (mmHg) -225.24 mmHg  Venous Pressure (mmHg) 288.88 mmHg  TMP (mmHg) -10.5 mmHg  Ultrafiltration Rate (mL/min) 730 mL/min  Dialysate Flow Rate (mL/min) 300 ml/min  Duration of HD Treatment -hour(s) 3.47 hour(s)  Cumulative Fluid Removed (mL) per Treatment  1977.1  HD Safety Checks Performed Yes  Intra-Hemodialysis Comments Tx completed   Received patient in bed to unit.  Alert and oriented.  Informed consent signed and in chart.   TX duration: 3.5  Patient tolerated well.  Transported back to the room  Alert, without acute distress.  Hand-off given to patient's nurse.   Access used: LAVG Access issues: None  Total UF removed: 2000 Medication(s) given: None   Mar Daring, LPN  [facility]

## 2023-03-21 NOTE — Progress Notes (Signed)
 Mobility Specialist Progress Note:    03/21/23 0901  Therapy Vitals  Temp 97.6 F (36.4 C)  Temp Source Oral  Pulse Rate 65  Resp 16  BP 134/68  Patient Position (if appropriate) Lying  Oxygen Therapy  SpO2 100 %  O2 Device Room Air  Mobility  Activity Ambulated with assistance in hallway  Level of Assistance Minimal assist, patient does 75% or more  Assistive Device Front wheel walker  Distance Ambulated (ft) 100 ft  Activity Response Tolerated well  Mobility Referral Yes  Mobility visit 1 Mobility  Mobility Specialist Start Time (ACUTE ONLY) [id number]  Mobility Specialist Stop Time (ACUTE ONLY) [id number]  Mobility Specialist Time Calculation (min) (ACUTE ONLY) 9 min   Pt received in bed and agreeable. Required minA to stand and CG for ambulation. No complaints throughout. Pt left in bed with call bell and all needs met.  D'Vante Earlene Plater Mobility Specialist Please contact via Special educational needs teacher or Rehab office at [phone]

## 2023-03-21 NOTE — TOC Progression Note (Addendum)
 Transition of Care [facility]) - Progression Note    Patient Details  Name: Duane Ortiz MRN: [id number] Date of Birth: 10/04/1950  [facility]) CM/SW Contact  Marliss Coots, LCSW Phone Number: 03/21/2023, 10:14 AM  Clinical Narrative:     10:14 AM CSW returned to patient's bedside to obtain SNF decision. Patient informed CSW of decision [facility]) made with son. SNF and medical team made aware of decision. CSW inquired about SDOH needs (social connections). Patient declined CSW of social connections resources and stated that he is involved with [facility] but does not receive their health insurance.  10:26 AM CSW submitted insurance authorization (ID [id number]) which is currently pending. SNF informed CSW that they are currently at capacity. CSW inquired about next bed availability. SNF requested CSW to follow up in mornings for bed availability. Medical team made aware.  1:29 PM Insurance authorization was approved and valid 03/19-03/21. Awaiting bed availability and [employer]. Medical team aware. SNF aware of approval.  Expected Discharge Plan: Skilled Nursing Facility Barriers to Discharge: Continued Medical Work up, SNF Pending bed offer  Expected Discharge Plan and Services In-house Referral: Clinical Social Work     Living arrangements for the past 2 months: Apartment                                       Social Determinants of Health (SDOH) Interventions SDOH Screenings   Food Insecurity: No Food Insecurity (03/14/2023)  Housing: Low Risk  (03/14/2023)  Transportation Needs: No Transportation Needs (03/14/2023)  Utilities: Not At Risk (03/14/2023)  Social Connections: Socially Isolated (03/14/2023)  Tobacco Use: High Risk (03/14/2023)    Readmission Risk Interventions     No data to display

## 2023-03-21 NOTE — Progress Notes (Signed)
 Duane Ortiz  ZOX:[id number] DOB: [birth year] DOA: 03/14/2023 PCP: Charlane Ferretti, DO    Brief Narrative:  73 year old with a history of ESRD on HD, gout, HTN, PE, and DM2 who presented to the ER 3/11 with complaints of low back pain after having suffered a fall 4-5 days prior.  The patient had not attended his last 2 HD sessions due to his pain.  Lumbar spine x-rays in the ER revealed anterior osteophytes but no acute fracture or subluxation.  Goals of Care:   Code Status: Full Code   DVT prophylaxis: heparin injection 5,000 Units Start: 03/15/23 1600  Interim Hx: No acute events recorded overnight.  Afebrile.  Vital signs stable.  Resting comfortably.  No new complaints today.  Alert and conversant.  In good spirits.  Assessment & Plan:  Low back pain following mechanical fall Plain films of the lumbar spine revealed no acute findings - CT of the lumbar spine revealed no acute findings and noted generalized spondylitic spurring most bulky at L3-4 and L4-5 - CT pelvis without acute findings - PT/OT suggest SNF rehab stay -awaiting bed for placement at this time  ESRD on HD TTS Care per Nephrology - missed multiple outpatient appointments prior to his admission - utilizes an AV graft -required additional HD treatments off schedule initially due to noncompliance as an outpatient but now has resumed usual schedule treatments without difficulty  Uncontrolled HTN - volume overload BP elevated at time of presentation in setting of multiple missed HD appointments - adjusted medical therapy -BP now well-controlled after multiple dialysis treatments  Anemia of ESRD Erythropoietin and iron at discretion of Nephrology  DM2 Does not appear to require chronic home medical therapy - CBG controlled  Gout Continue usual allopurinol -quiescent  History of pulmonary embolism Does not appear to require chronic anticoagulation at this time   Family Communication: No family present at time of  exam today Disposition: Awaiting SNF placement -remains medically ready for discharge when bed available   Objective: Blood pressure 134/68, pulse 65, temperature 97.6 F (36.4 C), temperature source Oral, resp. rate 16, height 5\' 11"  (1.803 m), weight 84.8 kg, SpO2 100%.  Intake/Output Summary (Last 24 hours) at 03/21/2023 0918 Last data filed at 03/20/2023 2300 Gross per 24 hour  Intake 240 ml  Output --  Net 240 ml   Filed Weights   03/15/23 1230 03/16/23 1450 03/18/23 1020  Weight: 88 kg 88.7 kg 84.8 kg    Examination: General: No acute respiratory distress Lungs: Clear to auscultation  Cardiovascular: Regular rate and rhythm  Abdomen: NT/ND, soft, BS+, no mass  Extremities: No significant edema  CBC: Recent Labs  Lab 03/14/23 1030 03/15/23 0406 03/17/23 0633 03/18/23 0915 03/21/23 0350  WBC 8.2   < > 6.7 7.1 8.4  NEUTROABS 6.3  --   --   --  6.1  HGB 13.5   < > 13.0 12.4* 13.3  HCT 42.4   < > 40.0 38.4* 40.9  MCV 88.5   < > 87.5 88.5 87.6  PLT 185   < > 126* 154 162   < > = values in this interval not displayed.   Basic Metabolic Panel: Recent Labs  Lab 03/17/23 0633 03/18/23 0915 03/21/23 0350  NA 134* 132* 143  K 4.0 4.0 4.5  CL 95* 97* 112*  CO2 26 23 25   GLUCOSE 86 117* 128*  BUN 40* 57* 40*  CREATININE 9.17* 11.36* <0.30*  CALCIUM 8.2* 8.1* 8.7*  PHOS 3.4 2.3* <1.0*  GFR: CrCl cannot be calculated (This lab value cannot be used to calculate CrCl because it is not a number: <0.30).   Scheduled Meds:  allopurinol  100 mg Oral QHS   amLODipine  10 mg Oral QHS   calcitRIOL  1 mcg Oral Q T,Th,Sat-1800   Chlorhexidine Gluconate Cloth  6 each Topical Q0600   cinacalcet  30 mg Oral Q T,Th,Sat-1800   cloNIDine  0.1 mg Oral BID   ferric citrate  210 mg Oral TID WC   heparin injection (subcutaneous)  5,000 Units Subcutaneous Q8H   hydrALAZINE  25 mg Oral Q8H   irbesartan  150 mg Oral Daily   nebivolol  10 mg Oral QHS   nicotine  21 mg  Transdermal Daily   pantoprazole  40 mg Oral Daily   QUEtiapine  12.5 mg Oral QHS   sodium chloride flush  3 mL Intravenous Q12H   tamsulosin  0.4 mg Oral QHS     LOS: 7 days   Lonia Blood, MD [facility] Office  [phone] Pager - Text Page per Loretha Stapler  If 7PM-7AM, please contact night-coverage per Amion 03/21/2023, 9:18 AM

## 2023-03-21 NOTE — Progress Notes (Signed)
 [facility] Progress Note   Subjective:    Seen and examined patient at bedside. No acute complaints. Plan for HD this afternoon.  Objective Vitals:   03/20/23 1635 03/20/23 2032 03/21/23 0527 03/21/23 0901  BP: (!) 155/71 (!) 155/63 (!) 127/52 134/68  Pulse: 68 74 60 65  Resp: 18 18 18 16   Temp:  98.7 F (37.1 C) 98.7 F (37.1 C) 97.6 F (36.4 C)  TempSrc:    Oral  SpO2: 98% 96% 100% 100%  Weight:      Height:       Physical Exam General: Alert male in NAD Heart: RRR, no murmurs, rubs or gallops Lungs: CTA anteriorly, respirations unlabored on RA Abdomen: Soft, non-distended, +BS Extremities: trace edema bilateral lower extremities Dialysis Access:  LUE AVF + t/b  Filed Weights   03/15/23 1230 03/16/23 1450 03/18/23 1020  Weight: 88 kg 88.7 kg 84.8 kg    Intake/Output Summary (Last 24 hours) at 03/21/2023 1327 Last data filed at 03/20/2023 2300 Gross per 24 hour  Intake 240 ml  Output --  Net 240 ml    Additional Objective Labs: Basic Metabolic Panel: Recent Labs  Lab 03/17/23 0633 03/18/23 0915 03/21/23 0350  NA 134* 132* 143  K 4.0 4.0 4.5  CL 95* 97* 112*  CO2 26 23 25   GLUCOSE 86 117* 128*  BUN 40* 57* 40*  CREATININE 9.17* 11.36* <0.30*  CALCIUM 8.2* 8.1* 8.7*  PHOS 3.4 2.3* <1.0*   Liver Function Tests: Recent Labs  Lab 03/15/23 0406 03/17/23 0633 03/18/23 0915 03/21/23 0350  AST 48*  --   --   --   ALT 82*  --   --   --   ALKPHOS 88  --   --   --   BILITOT 0.9  --   --   --   PROT 6.5  --   --   --   ALBUMIN 3.0* 2.7* 2.6* 2.6*   No results for input(s): "LIPASE", "AMYLASE" in the last 168 hours. CBC: Recent Labs  Lab 03/15/23 0406 03/17/23 0633 03/18/23 0915 03/21/23 0350  WBC 6.7 6.7 7.1 8.4  NEUTROABS  --   --   --  6.1  HGB 13.4 13.0 12.4* 13.3  HCT 41.0 40.0 38.4* 40.9  MCV 86.0 87.5 88.5 87.6  PLT 171 126* 154 162   Blood Culture No results found for: "SDES", "SPECREQUEST", "CULT",  "REPTSTATUS"  Cardiac Enzymes: No results for input(s): "CKTOTAL", "CKMB", "CKMBINDEX", "TROPONINI" in the last 168 hours. CBG: Recent Labs  Lab 03/19/23 0817 03/19/23 1640 03/19/23 1957 03/20/23 0428 03/20/23 1636  GLUCAP 115* 88 98 106* 73   Iron Studies: No results for input(s): "IRON", "TIBC", "TRANSFERRIN", "FERRITIN" in the last 72 hours. Lab Results  Component Value Date   INR 1.2 09/30/2019   Studies/Results: No results found.  Medications:   allopurinol  100 mg Oral QHS   amLODipine  10 mg Oral QHS   calcitRIOL  1 mcg Oral Q T,Th,Sat-1800   Chlorhexidine Gluconate Cloth  6 each Topical Q0600   cinacalcet  30 mg Oral Q T,Th,Sat-1800   cloNIDine  0.1 mg Oral BID   ferric citrate  210 mg Oral TID WC   heparin injection (subcutaneous)  5,000 Units Subcutaneous Q8H   hydrALAZINE  25 mg Oral Q8H   irbesartan  150 mg Oral Daily   nebivolol  10 mg Oral QHS   nicotine  21 mg Transdermal Daily   pantoprazole  40  mg Oral Daily   QUEtiapine  12.5 mg Oral QHS   sodium chloride flush  3 mL Intravenous Q12H   tamsulosin  0.4 mg Oral QHS    Dialysis Orders: Had treatment on 3/4, missed 3/8, 3/6, 3/1 F180, BFR 800, 2K, 2.5 Ca, 4hrs, EDW 89.5, LUE AVG, no esa, calcitriol w/ treatments, sensipar 30mg  TTS, auryxia 2 tabs w/ meals  Assessment/Plan: ESRD -  HD TTS. Noncompliant and missed multiple treatments over the last few days. HD off schedule Wed, now back on TTS schedule. Recent SrCr <0.30??-this seems to be a lab error. Re-checking labs in AM. Next HD this afternoon. Back pain - Related to fall. Per primary  HTN/volume-  BP initially elevated with volume overload. Improving with HD, edema much improved Anemia-  Hgb above goal. 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 Continue Calcitriol, Sensipar, and Auryxia for now but will adjust after reviewing tomorrow's lab results.  Duane Holmes,  NP [facility] 03/21/2023,1:27 PM  LOS: 7 days

## 2023-03-22 DIAGNOSIS — N186 End stage renal disease: Secondary | ICD-10-CM | POA: Diagnosis not present

## 2023-03-22 DIAGNOSIS — I10 Essential (primary) hypertension: Secondary | ICD-10-CM | POA: Diagnosis not present

## 2023-03-22 DIAGNOSIS — W19XXXA Unspecified fall, initial encounter: Secondary | ICD-10-CM

## 2023-03-22 LAB — RENAL FUNCTION PANEL
Albumin: 2.6 g/dL — ABNORMAL LOW (ref 3.5–5.0)
Albumin: 2.7 g/dL — ABNORMAL LOW (ref 3.5–5.0)
Anion gap: 13 (ref 5–15)
Anion gap: 14 (ref 5–15)
BUN: 69 mg/dL — ABNORMAL HIGH (ref 8–23)
BUN: 38 mg/dL — ABNORMAL HIGH (ref 8–23)
CO2: 25 mmol/L (ref 22–32)
CO2: 25 mmol/L (ref 22–32)
Calcium: 8.8 mg/dL — ABNORMAL LOW (ref 8.9–10.3)
Calcium: 8.6 mg/dL — ABNORMAL LOW (ref 8.9–10.3)
Chloride: 112 mmol/L — ABNORMAL LOW (ref 98–111)
Chloride: 92 mmol/L — ABNORMAL LOW (ref 98–111)
Creatinine, Ser: <11.27 mg/dL — ABNORMAL LOW (ref 0.61–1.24)
Creatinine, Ser: 7.44 mg/dL — ABNORMAL HIGH (ref 0.61–1.24)
GFR, Estimated: 4 mL/min — ABNORMAL LOW (ref >60)
GFR, Estimated: 7 mL/min — ABNORMAL LOW (ref >60)
Glucose, Bld: 128 mg/dL — ABNORMAL HIGH (ref 70–99)
Glucose, Bld: 90 mg/dL (ref 70–99)
Phosphorus: <1 mg/dL — CL (ref 2.5–4.6)
Phosphorus: <1 mg/dL — CL (ref 2.5–4.6)
Potassium: 4.5 mmol/L (ref 3.5–5.1)
Potassium: 3.6 mmol/L (ref 3.5–5.1)
Sodium: 143 mmol/L — ABNORMAL LOW (ref 135–145)
Sodium: 131 mmol/L — ABNORMAL LOW (ref 135–145)

## 2023-03-22 LAB — CBC WITH DIFFERENTIAL/PLATELET
Abs Immature Granulocytes: 0.06 10*3/uL (ref 0.00–0.07)
Basophils Absolute: 0 10*3/uL (ref 0.0–0.1)
Basophils Relative: 1 %
Eosinophils Absolute: 0.1 10*3/uL (ref 0.0–0.5)
Eosinophils Relative: 2 %
HCT: 40.9 % (ref 39.0–52.0)
Hemoglobin: 13.5 g/dL (ref 13.0–17.0)
Immature Granulocytes: 1 %
Lymphocytes Relative: 15 %
Lymphs Abs: 1 10*3/uL (ref 0.7–4.0)
MCH: 28.6 pg (ref 26.0–34.0)
MCHC: 33 g/dL (ref 30.0–36.0)
MCV: 86.7 fL (ref 80.0–100.0)
Monocytes Absolute: 0.9 10*3/uL (ref 0.1–1.0)
Monocytes Relative: 13 %
Neutro Abs: 4.9 10*3/uL (ref 1.7–7.7)
Neutrophils Relative %: 68 %
Platelets: 170 10*3/uL (ref 150–400)
RBC: 4.72 MIL/uL (ref 4.22–5.81)
RDW: 17.5 % — ABNORMAL HIGH (ref 11.5–15.5)
WBC: 7.1 10*3/uL (ref 4.0–10.5)
nRBC: 0 % (ref 0.0–0.2)

## 2023-03-22 LAB — PHOSPHORUS: Phosphorus: 3.8 mg/dL (ref 2.5–4.6)

## 2023-03-22 MED ORDER — CHLORHEXIDINE GLUCONATE CLOTH 2 % EX PADS
6.0000 | MEDICATED_PAD | Freq: Every day | CUTANEOUS | Status: DC
  Administered 2023-03-22: 6 via TOPICAL

## 2023-03-22 MED ORDER — CALCITRIOL 0.5 MCG PO CAPS: 1.0000 ug | ORAL_CAPSULE | ORAL | Status: AC

## 2023-03-22 MED ORDER — HYDRALAZINE HCL 25 MG PO TABS: 25.0000 mg | ORAL_TABLET | Freq: Three times a day (TID) | ORAL | Status: AC

## 2023-03-22 MED ORDER — SODIUM PHOSPHATES 45 MMOLE/15ML IV SOLN
30.0000 mmol | Freq: Once | INTRAVENOUS | Status: AC
  Administered 2023-03-22: 30 mmol via INTRAVENOUS
  Filled 2023-03-22: qty 10

## 2023-03-22 NOTE — Discharge Summary (Addendum)
 Physician Discharge Summary  Ulysees Robarts WUJ:[id number] DOB: [birth year] DOA: 03/14/2023  PCP: Charlane Ferretti, DO  Admit date: 03/14/2023 Discharge date: 03/22/2023  Admitted From: Home Discharge disposition: SNF  Brief narrative: Langford Carias is a 73 y.o. male with PMH significant for ESRD-HD-TTS, gout, HTN, PE, and DM2 who presented to the ER 3/11 with complaints of low back pain after having suffered a fall 4-5 days prior.  The patient had not attended his last 2 HD sessions due to his pain.   Lumbar spine x-rays in the ER revealed anterior osteophytes but no acute fracture or subluxation. CT of the lumbar spine revealed no acute findings and noted generalized spondylitic spurring most bulky at L3-4 and L4-5  CT pelvis without acute findings Admitted to [facility] PT was consulted.  SNF recommended   Subjective: Patient was seen and examined this morning. Pleasant elderly African-American male.  Lying on bed.  Not in distress.  Reports improvement in lower back pain  Hospital course: Low back pain following mechanical fall X-ray and CT scans as above , did not show any acute findings  Seen by PT.  SNF recommended  Currently pain controlled with Tylenol as needed   ESRD HD TTS Nephrology involved.  Had missed multiple outpatient appointments prior to his admission utilizes an AV graft -required additional HD treatments off schedule initially due to noncompliance as an outpatient but now has resumed usual schedule treatments without difficulty   Uncontrolled HTN - volume overload BP was elevated at time of presentation in setting of multiple missed HD appointments - adjusted medical therapy -BP now well-controlled after multiple dialysis treatments Discharged on Bystolic 10 mg daily, olmesartan 20 mg daily, amlodipine 10 mg daily, clonidine 0.1 mg twice daily, hydralazine 25 mg 3 times daily   Anemia of ESRD Erythropoietin and iron at discretion of Nephrology   DM2 Does not  appear to require chronic home medical therapy - CBG controlled   Gout Continue usual allopurinol -quiescent   History of pulmonary embolism Does not appear to require chronic anticoagulation  BPH Flomax  Goals of care   Code Status: Full Code     Consultants: Nephrology  Diet:  Diet Order             Diet general           Diet renal/carb modified with fluid restriction Diet-HS Snack? Nothing; Fluid restriction: 1200 mL Fluid; Room service appropriate? Yes; Fluid consistency: Thin  Diet effective now                   Nutritional status:  Body mass index is 24.2 kg/m.       Wounds:  -    Discharge Exam:   Vitals:   03/21/23 2135 03/22/23 0446 03/22/23 0500 03/22/23 0855  BP: 137/75 137/70  120/62  Pulse: 73 67  64  Resp: 18 18    Temp: 98.2 F (36.8 C) 98.1 F (36.7 C)    TempSrc:      SpO2: 98% 100%  97%  Weight:   78.7 kg   Height:        Body mass index is 24.2 kg/m.  General exam: Pleasant, elderly African-American male.  Not in pain Skin: No rashes, lesions or ulcers. HEENT: Atraumatic, normocephalic, no obvious bleeding Lungs: Clear to auscultation bilaterally,  CVS: S1, S2, no murmur,   GI/Abd: Soft, nontender, nondistended, bowel sound present,   CNS: Alert, awake, oriented x 3 Psychiatry: Mood appropriate,  Extremities: No  pedal edema, no calf tenderness,   Follow ups:    Contact information for follow-up providers     Charlane Ferretti, DO Follow up.   Specialty: Internal Medicine Contact information: [address] [place] [address] [phone]         Center, [facility]. Go on 03/23/2023.   Why: Schedule is Tuesday, Thursday, Saturday with 6:30 am chair time. Contact information: [address] [place] [address] [phone]              Contact information for after-discharge care     Destination     [facility] .   Service: Skilled Nursing Contact  information: [address] [place] [address] [phone]                     Discharge Instructions:   Discharge Instructions     Call MD for:  difficulty breathing, headache or visual disturbances   Complete by: As directed    Call MD for:  extreme fatigue   Complete by: As directed    Call MD for:  hives   Complete by: As directed    Call MD for:  persistant dizziness or light-headedness   Complete by: As directed    Call MD for:  persistant nausea and vomiting   Complete by: As directed    Call MD for:  severe uncontrolled pain   Complete by: As directed    Call MD for:  temperature >100.4   Complete by: As directed    Diet general   Complete by: As directed    Renal diet   Increase activity slowly   Complete by: As directed        Discharge Medications:   Allergies as of 03/22/2023       Reactions   Darvon [propoxyphene] Itching   Baclofen Other (See Comments)   confusion   Gabapentin    Felt like I was out of my head, slept all day, confusion    Sulfa Antibiotics Itching        Medication List     STOP taking these medications    ferric citrate 1 GM 210 MG(Fe) tablet Commonly known as: AURYXIA       TAKE these medications    allopurinol 100 MG tablet Commonly known as: ZYLOPRIM Take 100 mg by mouth at bedtime.   amLODipine 10 MG tablet Commonly known as: NORVASC Take 10 mg by mouth at bedtime.   ammonium lactate 12 % lotion Commonly known as: LAC-HYDRIN Apply 1 Application topically daily as needed (itching.).   calcitRIOL 0.5 MCG capsule Commonly known as: ROCALTROL Take 2 capsules (1 mcg total) by mouth every Tuesday, Thursday, and Saturday at 6 PM. Start taking on: March 23, 2023   cinacalcet 30 MG tablet Commonly known as: SENSIPAR Take 30 mg by mouth 3 (three) times a week. Take on Tuesday, Thursdays and Saturdays per patient   cloNIDine 0.1 MG tablet Commonly known as: CATAPRES Take 0.1 mg  by mouth 2 (two) times daily.   cyclobenzaprine 5 MG tablet Commonly known as: FLEXERIL Take 5 mg by mouth daily as needed for muscle spasms.   hydrALAZINE 25 MG tablet Commonly known as: APRESOLINE Take 1 tablet (25 mg total) by mouth every 8 (eight) hours. What changed: when to take this   lidocaine-prilocaine cream Commonly known as: EMLA Apply 1 Application topically 3 (three) times a week. Apply on Tuesday, Thursdays, Saturday  nebivolol 10 MG tablet Commonly known as: BYSTOLIC Take 10 mg by mouth at bedtime.   olmesartan 20 MG tablet Commonly known as: BENICAR Take 20 mg by mouth at bedtime.   tamsulosin 0.4 MG Caps capsule Commonly known as: FLOMAX Take 0.4 mg by mouth at bedtime.         The results of significant diagnostics from this hospitalization (including imaging, microbiology, ancillary and laboratory) are listed below for reference.    Procedures and Diagnostic Studies:   CT PELVIS WO CONTRAST Result Date: 03/15/2023 CLINICAL DATA:  Low back pain after fall. EXAM: CT PELVIS WITHOUT CONTRAST TECHNIQUE: Multidetector CT imaging of the pelvis was performed following the standard protocol without intravenous contrast. RADIATION DOSE REDUCTION: This exam was performed according to the departmental dose-optimization program which includes automated exposure control, adjustment of the mA and/or kV according to patient size and/or use of iterative reconstruction technique. COMPARISON:  05/28/2018 FINDINGS: Urinary Tract: No focal bladder abnormality. Mild haziness surrounding the bladder is identified. Bowel: No pathologic dilatation of the visualized bowel loops. The appendix is visualized and appears normal. No bowel wall thickening or inflammation noted. Vascular/Lymphatic: Aortic atherosclerosis. No abdominopelvic adenopathy. Reproductive:  No mass or other significant abnormality Other: Soft tissue stranding noted within the bilateral retroperitoneal spaces as well  as the presacral soft tissues of the pelvis. There is also soft tissue stranding/edema within the subcutaneous fat overlying the pelvis. Imaging findings are concerning for anasarca. No focal fluid collections identified. No significant free fluid. Musculoskeletal: No suspicious bone lesions identified. IMPRESSION: 1. No acute findings within the pelvis. 2. Soft tissue stranding noted within the bilateral retroperitoneal spaces as well as the presacral soft tissues of the pelvis. There is also soft tissue stranding/edema within the subcutaneous fat overlying the pelvis. Imaging findings are concerning for anasarca. 3. Mild haziness surrounding the bladder is identified. Correlate for any clinical signs or symptoms of cystitis. 4.  Aortic Atherosclerosis (ICD10-I70.0). Electronically Signed   By: Signa Kell M.D.   On: 03/15/2023 06:47   CT LUMBAR SPINE WO CONTRAST Result Date: 03/15/2023 CLINICAL DATA:  Low back pain associated with fall EXAM: CT LUMBAR SPINE WITHOUT CONTRAST TECHNIQUE: Multidetector CT imaging of the lumbar spine was performed without intravenous contrast administration. Multiplanar CT image reconstructions were also generated. RADIATION DOSE REDUCTION: This exam was performed according to the departmental dose-optimization program which includes automated exposure control, adjustment of the mA and/or kV according to patient size and/or use of iterative reconstruction technique. COMPARISON:  Radiography from yesterday.  Lumbar CT 05/28/2018 FINDINGS: Segmentation: 5 lumbar type vertebrae Alignment: Normal. Vertebrae: No acute fracture or focal pathologic process. Chronic Schmorl's nodes at multiple levels. Paraspinal and other soft tissues: Small pleural effusions and retroperitoneal edema. Lobulated left adrenal gland which is unchanged from 2020, compatible with benign process. Renal atrophy since comparison CT. Disc levels: Generalized spondylitic spurring which is most bulky at L3-4 to  L4-5. Generalized degenerative facet spurring with moderate foraminal narrowing on the left at L4-5. IMPRESSION: No acute finding. Chronic findings as described. Electronically Signed   By: Tiburcio Pea M.D.   On: 03/15/2023 05:18   DG Lumbar Spine Complete Result Date: 03/14/2023 CLINICAL DATA:  Fall with back pain. EXAM: LUMBAR SPINE - COMPLETE 4+ VIEW COMPARISON:  None Available. FINDINGS: No acute fracture or subluxation. Flowing anterior osteophytes throughout the lumbar spine. No bony lesions or destruction. Calcified plaque in the abdominal aorta. IMPRESSION: No acute findings. Flowing anterior osteophytes throughout the lumbar spine. Aortic  atherosclerosis. Electronically Signed   By: Irish Lack M.D.   On: 03/14/2023 14:31   DG Chest 2 View Result Date: 03/14/2023 CLINICAL DATA:  Fall with back pain. EXAM: CHEST - 2 VIEW COMPARISON:  10/24/2019 FINDINGS: Moderate cardiac enlargement. Ten show mild pulmonary interstitial edema without overt airspace edema. Mild bibasilar atelectasis, left greater than right. No significant pleural effusions or evidence of pneumothorax. The visualized skeletal structures are unremarkable. IMPRESSION: Moderate cardiac enlargement with mild pulmonary interstitial edema. Mild bibasilar atelectasis, left greater than right. Electronically Signed   By: Irish Lack M.D.   On: 03/14/2023 14:29     Labs:   Basic Metabolic Panel: Recent Labs  Lab 03/17/23 0633 03/18/23 0915 03/21/23 0350 03/22/23 0350  NA 134* 132* 133* 131*  K 4.0 4.0 4.1 3.6  CL 95* 97* 96* 92*  CO2 26 23 24 25   GLUCOSE 86 117* 118* 90  BUN 40* 57* 69* 38*  CREATININE 9.17* 11.36* 11.27* 7.44*  CALCIUM 8.2* 8.1* 8.8* 8.6*  PHOS 3.4 2.3* <1.0* <1.0*   GFR Estimated Creatinine Clearance: 9.1 mL/min (A) (by C-G formula based on SCr of 7.44 mg/dL (H)). Liver Function Tests: Recent Labs  Lab 03/17/23 4098 03/18/23 0915 03/21/23 0350 03/22/23 0350  ALBUMIN 2.7* 2.6* 2.6*  2.7*   No results for input(s): "LIPASE", "AMYLASE" in the last 168 hours. No results for input(s): "AMMONIA" in the last 168 hours. Coagulation profile No results for input(s): "INR", "PROTIME" in the last 168 hours.  CBC: Recent Labs  Lab 03/17/23 0633 03/18/23 0915 03/21/23 0350 03/22/23 0350  WBC 6.7 7.1 8.4 7.1  NEUTROABS  --   --  6.1 4.9  HGB 13.0 12.4* 13.3 13.5  HCT 40.0 38.4* 40.9 40.9  MCV 87.5 88.5 87.6 86.7  PLT 126* 154 162 170   Cardiac Enzymes: No results for input(s): "CKTOTAL", "CKMB", "CKMBINDEX", "TROPONINI" in the last 168 hours. BNP: Invalid input(s): "POCBNP" CBG: Recent Labs  Lab 03/19/23 0817 03/19/23 1640 03/19/23 1957 03/20/23 0428 03/20/23 1636  GLUCAP 115* 88 98 106* 73   D-Dimer No results for input(s): "DDIMER" in the last 72 hours. Hgb A1c No results for input(s): "HGBA1C" in the last 72 hours. Lipid Profile No results for input(s): "CHOL", "HDL", "LDLCALC", "TRIG", "CHOLHDL", "LDLDIRECT" in the last 72 hours. Thyroid function studies No results for input(s): "TSH", "T4TOTAL", "T3FREE", "THYROIDAB" in the last 72 hours.  Invalid input(s): "FREET3" Anemia work up No results for input(s): "VITAMINB12", "FOLATE", "FERRITIN", "TIBC", "IRON", "RETICCTPCT" in the last 72 hours. Microbiology No results found for this or any previous visit (from the past 240 hours).  Time coordinating discharge: 45 minutes  Signed: Uday Jantz  [facility] 03/22/2023, 2:23 PM

## 2023-03-22 NOTE — Progress Notes (Signed)
 Discharge Note:  Patient discharged via EMS to [facility].  PIV removed.  Patient has all his belongings including his cane, cell phone, charge, wallet, and clothes.  All questions answered.  Bernie Covey RN

## 2023-03-22 NOTE — Discharge Planning (Signed)
 [facility] Patient Discharge Orders- [facility] CLINIC: [facility]-UPDATED VERSION PLEASE REVIEW THIS ONE!!!!  Patient's name: Duane Ortiz Admit/DC Dates: 03/14/2023 - 03/22/2023  Discharge Diagnoses: Volume overload 2nd missed HD  Low back pain 2nd mechanical fall HTN 2nd volume overload  Aranesp: Given: No    Last Hgb: 13.5 PRBC's Given: No  ESA dose for discharge: N/A IV Iron dose at discharge: Hold weekly iron. Current Hgb here was above goal  Heparin change: No  EDW Change: Yes New EDW: Lower to 79.5kg. He was under his EDW here.  [place]ath Change: No  Access intervention/Change: No  Calcitriol change: No  Discharge Labs: Calcium 8.6  Phosphorus <1-Repleted with Na Phos 3/19. Please note, repeat phos after repletion was 3.8. Albumin 2.7  K+ 3.6  IV Antibiotics: No  On Coumadin?: No   OTHER/APPTS/LAB ORDERS: Please check phosphorus level pre-HD on 3/20 then weekly until phos levels stabilize I stopped his Auryxia since his phos was low here    D/C Meds to be reconciled by nurse after every discharge.  Completed By: Salome Holmes, NP   Reviewed by: MD:______ RN_______

## 2023-03-22 NOTE — Progress Notes (Addendum)
 [facility] Progress Note   Subjective:    Seen and examined patient at bedside. Repeat labs show hypophosphatemia so will be repleted today. Also stopping binders. Tolerated yesterday's HD with net UF 2L. Plan for discharge to SNF later this afternoon. Next HD 3/20 only if he remains inpatient.  Objective Vitals:   03/21/23 2135 03/22/23 0446 03/22/23 0500 03/22/23 0855  BP: 137/75 137/70  120/62  Pulse: 73 67  64  Resp: 18 18    Temp: 98.2 F (36.8 C) 98.1 F (36.7 C)    TempSrc:      SpO2: 98% 100%  97%  Weight:   78.7 kg   Height:       Physical Exam General: Alert male in NAD Heart: RRR, no murmurs, rubs or gallops Lungs: CTA anteriorly, respirations unlabored on RA Abdomen: Soft, non-distended, +BS Extremities: trace edema bilateral lower extremities Dialysis Access:  LUE AVF + t/b  Filed Weights   03/21/23 1452 03/21/23 1842 03/22/23 0500  Weight: 81.8 kg 78.7 kg 78.7 kg    Intake/Output Summary (Last 24 hours) at 03/22/2023 1054 Last data filed at 03/22/2023 0934 Gross per 24 hour  Intake 720 ml  Output 2000 ml  Net -1280 ml    Additional Objective Labs: Basic Metabolic Panel: Recent Labs  Lab 03/18/23 0915 03/21/23 0350 03/22/23 0350  NA 132* 133* 131*  K 4.0 4.1 3.6  CL 97* 96* 92*  CO2 23 24 25   GLUCOSE 117* 118* 90  BUN 57* 69* 38*  CREATININE 11.36* 11.27* 7.44*  CALCIUM 8.1* 8.8* 8.6*  PHOS 2.3* <1.0* <1.0*   Liver Function Tests: Recent Labs  Lab 03/18/23 0915 03/21/23 0350 03/22/23 0350  ALBUMIN 2.6* 2.6* 2.7*   No results for input(s): "LIPASE", "AMYLASE" in the last 168 hours. CBC: Recent Labs  Lab 03/17/23 0633 03/18/23 0915 03/21/23 0350 03/22/23 0350  WBC 6.7 7.1 8.4 7.1  NEUTROABS  --   --  6.1 4.9  HGB 13.0 12.4* 13.3 13.5  HCT 40.0 38.4* 40.9 40.9  MCV 87.5 88.5 87.6 86.7  PLT 126* 154 162 170   Blood Culture No results found for: "SDES", "SPECREQUEST", "CULT", "REPTSTATUS"  Cardiac  Enzymes: No results for input(s): "CKTOTAL", "CKMB", "CKMBINDEX", "TROPONINI" in the last 168 hours. CBG: Recent Labs  Lab 03/19/23 0817 03/19/23 1640 03/19/23 1957 03/20/23 0428 03/20/23 1636  GLUCAP 115* 88 98 106* 73   Iron Studies: No results for input(s): "IRON", "TIBC", "TRANSFERRIN", "FERRITIN" in the last 72 hours. Lab Results  Component Value Date   INR 1.2 09/30/2019   Studies/Results: No results found.  Medications:  sodium PHOSPHATE IVPB (in mmol)      allopurinol  100 mg Oral QHS   amLODipine  10 mg Oral QHS   calcitRIOL  1 mcg Oral Q T,Th,Sat-1800   Chlorhexidine Gluconate Cloth  6 each Topical Q0600   cinacalcet  30 mg Oral Q T,Th,Sat-1800   cloNIDine  0.1 mg Oral BID   heparin injection (subcutaneous)  5,000 Units Subcutaneous Q8H   hydrALAZINE  25 mg Oral Q8H   irbesartan  150 mg Oral Daily   nebivolol  10 mg Oral QHS   nicotine  21 mg Transdermal Daily   pantoprazole  40 mg Oral Daily   QUEtiapine  12.5 mg Oral QHS   sodium chloride flush  3 mL Intravenous Q12H   tamsulosin  0.4 mg Oral QHS    Dialysis Orders: Had treatment on 3/4, missed 3/8, 3/6, 3/1 F180,  BFR 800, 2K, 2.5 Ca, 4hrs, EDW 89.5, LUE AVG, no esa, calcitriol w/ treatments, sensipar 30mg  TTS, auryxia 2 tabs w/ meals  Assessment/Plan: ESRD -  HD TTS. Noncompliant and missed multiple treatments over the last few days. HD off schedule Wed, now back on TTS schedule. Next HD 3/20 if he remains inpatient Back pain - Related to fall. Per primary  HTN/volume-  BP initially elevated with volume overload. Improving with HD, edema much improved Anemia-  Hgb above goal. 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 Ordered sodium phosphate X 1 and stopped [place]. Continue Calcitriol and Sensipar Dispo - Plan for discharge to SNF later this afternoon after sodium phos repletion. Plan for HD tomorrow morning only if he remains inpatient. I placed HD orders in for tomorrow 1st shift  just in case.   Salome Holmes, NP [facility] 03/22/2023,10:54 AM  LOS: 8 days

## 2023-03-22 NOTE — TOC Transition Note (Signed)
 Transition of Care [facility]) - Discharge Note   Patient Details  Name: Duane Ortiz MRN: [id number] Date of Birth: [birth year]  [facility]) CM/SW Contact:  Mearl Latin, LCSW Phone Number: 03/22/2023, 4:27 PM   Clinical Narrative:    Patient will DC to: [facility] Anticipated DC date: 03/22/23 Family notified: Son, Delila Pereyra Transport by: Sharin Mons 6:30p after infusion   Per MD patient ready for DC to [place]. RN to call report prior to discharge [phone] rm [phone]). RN, patient, patient's family, and facility notified of DC. Discharge Summary and FL2 sent to facility. DC packet on chart. Ambulance transport requested for patient.   CSW will sign off for now as social work intervention is no longer needed. Please consult [place] again if new needs arise.     Final next level of care: Skilled Nursing Facility Barriers to Discharge: Barriers Resolved   Patient Goals and CMS Choice Patient states their goals for this hospitalization and ongoing recovery are:: Rehab CMS Medicare.gov Compare Post Acute Care list provided to:: Patient Choice offered to / list presented to : Patient [place] ownership interest in [facility].provided to:: Patient    Discharge Placement   Existing PASRR number confirmed : 03/22/23          Patient chooses bed at:  [facility]) Patient to be transferred to facility by: PTAR Name of family member notified: Son Patient and family notified of of transfer: 03/22/23  Discharge Plan and Services Additional resources added to the After Visit Summary for   In-house Referral: Clinical Social Work                                   Social Drivers of Health (SDOH) Interventions SDOH Screenings   Food Insecurity: No Food Insecurity (03/14/2023)  Housing: Low Risk  (03/14/2023)  Transportation Needs: No Transportation Needs (03/14/2023)  Utilities: Not At Risk (03/14/2023)  Social Connections: Socially Isolated (03/14/2023)   Tobacco Use: High Risk (03/14/2023)     Readmission Risk Interventions     No data to display

## 2023-03-22 NOTE — TOC Progression Note (Addendum)
 Transition of Care [facility]) - Progression Note    Patient Details  Name: Duane Ortiz MRN: [id number] Date of Birth: [birth year]  Transition of Care [facility]) CM/SW Contact  Mearl Latin, LCSW Phone Number: 03/22/2023, 9:51 AM  Clinical Narrative:    9:51 AM-CSW awaiting response from [facility] on bed availability.   11:50 AM-[facility] able to accept patient after 3pm. CSW updated patient who agreed for CSW to update his son. Will require PTAR.   Expected Discharge Plan: Skilled Nursing Facility Barriers to Discharge: Continued Medical Work up, SNF Pending bed offer  Expected Discharge Plan and Services In-house Referral: Clinical Social Work     Living arrangements for the past 2 months: Apartment                                       Social Determinants of Health (SDOH) Interventions SDOH Screenings   Food Insecurity: No Food Insecurity (03/14/2023)  Housing: Low Risk  (03/14/2023)  Transportation Needs: No Transportation Needs (03/14/2023)  Utilities: Not At Risk (03/14/2023)  Social Connections: Socially Isolated (03/14/2023)  Tobacco Use: High Risk (03/14/2023)    Readmission Risk Interventions     No data to display

## 2023-03-22 NOTE — Progress Notes (Signed)
 Report given to [facility], nurse, PIV removed, Infusion complete. Belongings gathered. Pt aware of transfer. No further questions at this time.p

## 2023-03-22 NOTE — Plan of Care (Signed)

## 2023-03-22 NOTE — Progress Notes (Signed)
 Mobility Specialist Progress Note:    03/22/23 0900  Mobility  Activity Ambulated with assistance in hallway  Level of Assistance Moderate assist, patient does 50-74%  Assistive Device Front wheel walker  Distance Ambulated (ft) 60 ft  Activity Response Tolerated well  Mobility Referral Yes  Mobility visit 1 Mobility  Mobility Specialist Start Time (ACUTE ONLY) [id number]  Mobility Specialist Stop Time (ACUTE ONLY) 0847  Mobility Specialist Time Calculation (min) (ACUTE ONLY) 13 min   Pt received in chair, trying to don clothing. MS assisted. C/o fatigue this date and required modA to stand. Distance limited d/t fatigue. Returned to room w/o fault. Pt left in bed with call bell and all needs met. Bed alarm on.  Duane Ortiz Mobility Specialist Please contact via Special educational needs teacher or Rehab office at [phone]

## 2023-03-22 NOTE — Progress Notes (Signed)
 Advised by CSW of pt's d/c to snf today. Contacted [facility] to be advised of pt's d/c today and that pt should resume care tomorrow. HD info placed on AVS as well.   Olivia Canter Renal Navigator [phone]

## 2023-05-24 ENCOUNTER — Other Ambulatory Visit: Payer: Self-pay | Admitting: Internal Medicine

## 2023-05-24 DIAGNOSIS — M48062 Spinal stenosis, lumbar region with neurogenic claudication: Secondary | ICD-10-CM

## 2023-09-07 NOTE — Telephone Encounter (Signed)
**Note De-Identified Shivaun Bilello Obfuscation** I called the pt but got no answer and his VM is full so I could not leave a message. We have called the pt multiple times and sent him a letter asking him to contact us  but he has not.  I have sent him a [facility] message today asking him to contact us  with updated insurance information or if he has decided not to do the Sleep Study.

## 2023-09-21 NOTE — Telephone Encounter (Signed)
**Note De-Identified Duane Ortiz Obfuscation** I have mailed a letter to the pt as well.

## 2023-10-31 NOTE — Telephone Encounter (Signed)
**Note De-Identified Duane Ortiz Obfuscation** The pt did not reply to my [facility] messages, letter or my calls (see phone note in the pts chart from 01/24/2023). I have canceled the Split Night Sleep Study.

## 2023-12-24 NOTE — Progress Notes (Signed)
 Patient: Duane Ortiz, 28-Feb-1950, 73y, M Dialysis Location: [facility] Attending Nephrologist: Deliliah Housekeeper  Service Date: 12/12/2023 Service Provider: Farhan Maqsood, MD I met face to face with the patient today.  OVERVIEW The patient presented with ESRD on dialysis Primary cause of renal failure: Type 2 diabetes mellitus with other diabetic kidney complication  Medications and labs reviewed.  HOME MEDICATIONS  Home Medications:    Calcium Acetate 667 mg, By Mouth, Take 2 Capsule Three times a day With Meals   hydralazine  25 mg, Take 1 tablet (25 mg total) by mouth in the morning and 1 tablet (25 mg total) at noon and 1 tablet (25 mg total) in the evening. DO NOT TAKE if BP <150/90.   pravastatin 40 mg, Take 1 tablet (40 mg total) by mouth daily.   Sucroferric Oxyhydroxide (Velphoro) 500 mg, By Mouth, Take 1 Tablet Three times a day With Meals   olmesartan 40 mg, by mouth, Take 1 tablet once a day   isosorbide mononitrate 60 mg   nebivolol  10 mg, TAKE 2 TABLETS (20 MG TOTAL) BY MOUTH AT BEDTIME   Sevelamer Carbonate Tablet (Renvela) 800 mg, By Mouth, Take 2 Tablet Three times a day With Meals   clonidine  HCl (clonidine  hcl) 0.1 mg, by mouth, Take 1 tablet twice a day   amlodipine  10 mg, by mouth, Take 1 tablet every night at bedtime   lidocaine -prilocaine  2.5-2.5%, to skin, Apply 1 a small amount three times a week as directed apply to AVG 60 min prior to dialysis, cover with occlusive dressing: Saran Wrap   tamsulosin  0.4 mg, by mouth, Take 1 capsule once a day   allopurinol  100 mg, by mouth, Take 1 tablet once a day   cyclobenzaprine  5 mg, by mouth, Take 1 tablet twice a day as needed  Allergies:    gabapentin, Sulfa (Sulfonamide Antibiotics), SULFA SULFONAMIDE ANTIBIOTICS, gabapentin, HYDRALAZINE   LAST HOSPITALIZATION Discharge Diagnosis: M54.50 Low back pain, unspecified E87.70 Fluid overload, unspecified Admission Date 03/14/23 Discharge Date  03/22/23  DIALYSIS PRESCRIPTION   IHD 3x Week Start date: 12/23/23   Dialyzer: 180NRe Optiflux   BFR: 400   DFR: Manual 800   Potassium: 2.0   Sodium: 137   EDW: 76   Duration: 3:15   Calcium: 2.0   Bicarb: 35   Rx updated on: 12/22/2023  TREATMENT ASSESSMENT  BP Stand Pre   12/23/2023: 148/96   12/21/2023: 203/89   12/19/2023: 224/104  BP Sit Pre   12/23/2023: 167/81   12/21/2023: 99/50   12/19/2023: 216/97  BP Stand Post   12/23/2023: 145/95   12/19/2023: 148/88  BP Sit Post   12/23/2023: 159/83   12/21/2023: 170/84   12/19/2023: 156/77  Prescribed Tx time   12/23/2023: 3:15   12/21/2023: 3:15   12/19/2023: 3:15  Tx Duration   12/23/2023: 3:15   12/21/2023: 3:15   12/19/2023: 3:15  Missed Treatments 0 - last 30 days 0 - last 60 days  FLUID ASSESSMENT  EDW (kg)   12/23/2023: 76.0   12/21/2023: 76.5   12/19/2023: 76.5  Weight Pre (kg)   12/23/2023: 76.2   12/21/2023: 77.7   12/19/2023: 78.2  Weight Post (kg)   12/23/2023: 75.8   12/21/2023: 76.2   12/19/2023: 77.2  PWV (kg)   12/23/2023: -0.2   12/21/2023: -0.3   12/19/2023: 0.7  UF Rate (mL/kg/hr)   12/23/2023: 1.6   12/21/2023: 6.1   12/19/2023: 4  ADEQUACY ASSESSMENT  spKt/V, URR   12/07/2023: 1.46, 70.7  11/09/2023: 1.69, 77.1   10/05/2023: 1.52, 72.7  ACCESS ASSESSMENT   Access Type: AVGraft   Access SubType: Synthetic - Standard (PTFE)   Access Status: Active (In Use) - 09/25/2019   Access Location: Left Upper Arm   Created: 09/16/2019  Flow   11/21/2023: 1091   11/16/2023: 1172   10/24/2023: 1878  ANEMIA ASSESSMENT  HGB, TSAT   12/21/2023: 11.2, -   12/14/2023: 11.3, -   12/07/2023: 9.8, 25.0    Ferritin   11/09/2023: 1239.0   08/10/2023: 1377.0   07/27/2023: 1594.0  Mircera, IVP (mcg)   12/12/2023: 75   11/28/2023: 75  Iron  Sucrose (Venofer) (mg)   12/16/2023: 100   12/14/2023: 100   12/12/2023: 100  BMM ASSESSMENT  PTH, Intact    12/07/2023: 215.0   11/09/2023: 602.0   10/12/2023: 383.0    Calcium, Phosphorus   12/07/2023: 8.8, 5.7   11/09/2023: 8.9, 7.5   10/05/2023: 8.8, 7.4  Vitamin D  (Calcitriol ) Oral (mcg)   12/23/2023: 1.5   12/21/2023: 1.5   12/19/2023: 1.5  Cinacalcet  (Sensipar ) (mg)   12/23/2023: 30   12/21/2023: 30   12/19/2023: 30  NUTRITION ASSESSMENT  Potassium, Albumin   12/07/2023: 4.3, 3.9   11/09/2023: 4.2, 4.0   10/05/2023: 4.2, 3.7    eNPCR   12/07/2023: 0.71   11/09/2023: 0.85   10/05/2023: 0.61  DIAGNOSIS Chief Complaint: N18.6 End stage renal disease   Patient data updated 12/24/2023 at 2:07 PM Signed By: Verda Antu, MD  on 12/24/2023 2:07:38 PM

## 2024-01-02 ENCOUNTER — Other Ambulatory Visit: Payer: Self-pay

## 2024-01-02 ENCOUNTER — Encounter (HOSPITAL_COMMUNITY): Payer: Self-pay

## 2024-01-02 ENCOUNTER — Emergency Department (HOSPITAL_COMMUNITY)
Admission: EM | Admit: 2024-01-02 | Discharge: 2024-01-02 | Disposition: A | Discharge status: Home / Self Care | Attending: Emergency Medicine | Admitting: Emergency Medicine

## 2024-01-02 ENCOUNTER — Emergency Department (HOSPITAL_COMMUNITY)

## 2024-01-02 DIAGNOSIS — Z992 Dependence on renal dialysis: Secondary | ICD-10-CM | POA: Insufficient documentation

## 2024-01-02 DIAGNOSIS — I120 Hypertensive chronic kidney disease with stage 5 chronic kidney disease or end stage renal disease: Secondary | ICD-10-CM | POA: Insufficient documentation

## 2024-01-02 DIAGNOSIS — R079 Chest pain, unspecified: Secondary | ICD-10-CM | POA: Diagnosis not present

## 2024-01-02 DIAGNOSIS — Z79899 Other long term (current) drug therapy: Secondary | ICD-10-CM | POA: Diagnosis not present

## 2024-01-02 DIAGNOSIS — R0602 Shortness of breath: Secondary | ICD-10-CM

## 2024-01-02 DIAGNOSIS — R030 Elevated blood-pressure reading, without diagnosis of hypertension: Secondary | ICD-10-CM | POA: Diagnosis not present

## 2024-01-02 DIAGNOSIS — N186 End stage renal disease: Secondary | ICD-10-CM | POA: Diagnosis not present

## 2024-01-02 LAB — CBC
HCT: 34.7 % — ABNORMAL LOW (ref 39.0–52.0)
Hemoglobin: 11 g/dL — ABNORMAL LOW (ref 13.0–17.0)
MCH: 28.3 pg (ref 26.0–34.0)
MCHC: 31.7 g/dL (ref 30.0–36.0)
MCV: 89.2 fL (ref 80.0–100.0)
Platelets: 192 K/uL (ref 150–400)
RBC: 3.89 MIL/uL — ABNORMAL LOW (ref 4.22–5.81)
RDW: 14.3 % (ref 11.5–15.5)
WBC: 6.5 K/uL (ref 4.0–10.5)
nRBC: 0 % (ref 0.0–0.2)

## 2024-01-02 LAB — I-STAT CHEM 8, ED
BUN: 96 mg/dL — ABNORMAL HIGH (ref 8–23)
Calcium, Ion: 0.91 mmol/L — ABNORMAL LOW (ref 1.15–1.40)
Chloride: 105 mmol/L (ref 98–111)
Creatinine, Ser: >18 mg/dL — ABNORMAL HIGH (ref 0.61–1.24)
Glucose, Bld: 79 mg/dL (ref 70–99)
HCT: 34 % — ABNORMAL LOW (ref 39.0–52.0)
Hemoglobin: 11.6 g/dL — ABNORMAL LOW (ref 13.0–17.0)
Potassium: 5.7 mmol/L — ABNORMAL HIGH (ref 3.5–5.1)
Sodium: 136 mmol/L (ref 135–145)
TCO2: 18 mmol/L — ABNORMAL LOW (ref 22–32)

## 2024-01-02 LAB — TROPONIN T, HIGH SENSITIVITY: Troponin T High Sensitivity: 84 ng/L — ABNORMAL HIGH (ref 0–19)

## 2024-01-02 LAB — BASIC METABOLIC PANEL WITH GFR
Anion gap: 23 — ABNORMAL HIGH (ref 5–15)
BUN: 99 mg/dL — ABNORMAL HIGH (ref 8–23)
CO2: 17 mmol/L — ABNORMAL LOW (ref 22–32)
Calcium: 8.8 mg/dL — ABNORMAL LOW (ref 8.9–10.3)
Chloride: 98 mmol/L (ref 98–111)
Creatinine, Ser: 18.5 mg/dL — ABNORMAL HIGH (ref 0.61–1.24)
GFR, Estimated: 2 mL/min — ABNORMAL LOW
Glucose, Bld: 87 mg/dL (ref 70–99)
Potassium: 5.9 mmol/L — ABNORMAL HIGH (ref 3.5–5.1)
Sodium: 138 mmol/L (ref 135–145)

## 2024-01-02 LAB — PRO BRAIN NATRIURETIC PEPTIDE: Pro Brain Natriuretic Peptide: >35000 pg/mL — ABNORMAL HIGH

## 2024-01-02 MED ORDER — ALTEPLASE 2 MG IJ SOLR: 2.0000 mg | Freq: Once | INTRAMUSCULAR | Status: DC | PRN

## 2024-01-02 MED ORDER — LIDOCAINE HCL (PF) 1 % IJ SOLN: 5.0000 mL | INTRAMUSCULAR | Status: DC | PRN

## 2024-01-02 MED ORDER — VANCOMYCIN HCL IN DEXTROSE 1-5 GM/200ML-% IV SOLN
INTRAVENOUS | Status: AC
  Filled 2024-01-02: qty 200

## 2024-01-02 MED ORDER — LIDOCAINE-PRILOCAINE 2.5-2.5 % EX CREA: 1.0000 | TOPICAL_CREAM | CUTANEOUS | Status: DC | PRN

## 2024-01-02 MED ORDER — PENTAFLUOROPROP-TETRAFLUOROETH EX AERO: 1.0000 | INHALATION_SPRAY | CUTANEOUS | Status: DC | PRN

## 2024-01-02 MED ORDER — ANTICOAGULANT SODIUM CITRATE 4% (200MG/5ML) IV SOLN
5.0000 mL | Status: DC | PRN
  Filled 2024-01-02: qty 5

## 2024-01-02 MED ORDER — HEPARIN SODIUM (PORCINE) 1000 UNIT/ML DIALYSIS: 1000.0000 [IU] | INTRAMUSCULAR | Status: DC | PRN

## 2024-01-02 MED ORDER — CHLORHEXIDINE GLUCONATE CLOTH 2 % EX PADS: 6.0000 | MEDICATED_PAD | Freq: Every day | CUTANEOUS | Status: DC

## 2024-01-02 MED ORDER — SODIUM ZIRCONIUM CYCLOSILICATE 5 G PO PACK
5.0000 g | PACK | Freq: Once | ORAL | Status: DC
  Filled 2024-01-02: qty 1

## 2024-01-02 NOTE — Discharge Instructions (Signed)
 You were seen today for dialysis needs. While you were here we monitored your vitals, preformed a physical exam, and got dialysis. These were all reassuring and there is no indication for any further testing or intervention in the emergency department at this time.   Things to do:  - Follow up with your primary care provider within the next 1-2 weeks - Please return on Thursday for your neck session of dialysis if you are not already set up for outpatient dialysis by then  Return to the emergency department if you have any new or worsening symptoms including fevers, chills, chest pain, shortness of breath, inability tolerate p.o., dialysis needs, or if you have any other concerns.

## 2024-01-02 NOTE — Progress Notes (Addendum)
 Contacted by ED staff regarding pt moving to this area and pt not having an out-pt HD clinic arranged. Pt's home HD clinic made a transfer request to a local HD clinic but pt cannot be accepted at that clinic at this time. [facility] staff aware of situation and to investigate possible local option for pt. Awaiting update from [facility] staff. Update provided to ED provider, nephrologist, and renal PA. Attempted to reach pt via phone. Unable to leave a message. Sent pt a HIPAA compliant text message to pt questing a return call. No call received at this time. Will assist as needed.   Randine Mungo Dialysis Navigator [phone]  Addendum at 3:41 pm: New referral for clinic placement [place] will be required per [facility] staff. Referral submitted to [facility] admissions this afternoon with request for [place] where pt has been a pt before. Met with pt at bedside while receiving HD. Confirmed pt's demographics and that pt plans to remain in this area permanently. Pt advised that referral will be submitted today for review and will provide updates to pt as updates are received. Pt voices understanding. Update provided to nephrologist, renal PA, and ED provider as well.

## 2024-01-02 NOTE — ED Provider Notes (Signed)
 Assume Care - Medical Decision Making  Care of patient assumed from previous emergency medicine provider at 1500. See their note for further details of history, physical exam and plan.  Briefly, Duane Ortiz is a 73 y.o. male who presents with: Need for dialysis.  Clinical Course as of 01/02/24 1926  Tue Jan 02, 2024  1102 Dialysis team including Dr Jerrye working to help patient with outpatient dialysis.  In the meantime, patient to receive dialysis here while in ED and will need to be reassessed after treatment.  [JB]    Clinical Course User Index [JB] Barrett, Warren SAILOR, PA-C     Reassessment: I personally reassessed the patient:  Vital Signs:  The most current vitals were  Vitals:   01/02/24 1707 01/02/24 1737 01/02/24 1801 01/02/24 1810  BP: (!) 200/80 (!) 199/84 (!) 209/80 (!) 201/92  Pulse: 68 68 64 70  Resp: 18 20 18 17   Temp:   97.8 F (36.6 C) 97.9 F (36.6 C)  TempSrc:      SpO2: 100% 100% 100% 99%  Weight:      Height:       Hemodynamics:  The patient is hemodynamically stable. Mental Status:  The patient is alert  Additional MDM/ED Course: Patient handed off in hemodynamically stable condition.  Returned to dialysis feeling much better with no complaints.  States he got his full run of dialysis.  Referral has been sent for him to have outpatient dialysis set up.  He plans on returning Thursday if he does not have set up for outpatient dialysis by then.  Patient discharged home in hemodynamically stable condition with strict return precautions.  Disposition:  I discussed the plan for discharge with the patient and/or their surrogate at bedside prior to discharge and they were in agreement with the plan and verbalized understanding of the return precautions provided. All questions answered to the best of my ability. Ultimately, the patient was discharged in stable condition with stable vital signs. I am reassured that they are capable of close follow up and good  social support at home.   Clinical Impression:  1. Shortness of breath   2. ESRD on dialysis (HCC)   3. Elevated blood pressure reading    The plan for this patient was discussed with Dr. Laurice, who voiced agreement and who oversaw evaluation and treatment of this patient.   Electronically signed by:   Glendia Carlin Ancona, M.D. PGY-2, Emergency Medicine      Ancona Glendia, MD 01/02/24 WINDELL Laurice Maude JAYSON, MD 01/03/24 2017

## 2024-01-02 NOTE — Progress Notes (Signed)
 Nephrology Quick Note:  S: Notified by ED that patient had presented after having moved back from [facility] to [facility] without having dialysis arranged. Apparently, there was a request in process but not yet approved.   Seen in HD unit - feels well overall. DOE earlier, better with rest. No CP, abd pain, N/V/D, fever/chills.   O: Vitals:   01/02/24 1434 01/02/24 1508  BP: (!) 213/93 (!) 177/92  Pulse: 66 67  Resp: (!) 37 20  Temp:    SpO2: 100% 100%   GEN: Well appearing, NAD. Room air CV: RRR PULM: Dull B bases ABD: soft EXT: 1+ BLE edema ACCESS: LUE AVF +t/b  A/P:  ESRD: Will provide dialysis today and ask our renal SW to get involved about possibly expediting his local placement. I recognize him - was a patient at our [facility] unit for a while before moving away.  HTN: BP high, 3L UFG with HD - resume home meds.  HyperK: Will improve with dialysis.  If we cannot tee up a discharge plan for him today, then the options will be to admit for HD unit placement or have him discharge home and return for next dialysis in 2 days. This will also depend on if he is symptom free and if BP improves with HD.  Izetta Boehringer, PA-C [employer] Pager [phone]

## 2024-01-02 NOTE — Progress Notes (Signed)
 Pt. Came in awake and oriented. Consent signed and on file.  Started with no complaints  UF removal: 2700 mL Tx duration: 3 hours and 15 minutes  Access used: Left AVF Access issue: High venous pressure, needed to decrease BFR to 350ml/L  Tx ended early due to machine problem but able to return blood Pressure dressing applied. Endorsed to floor nurse. Transported to room.  Raney Antwine Rubi Neytiri Asche, RN [facility]

## 2024-01-02 NOTE — ED Provider Notes (Signed)
 " [facility] EMERGENCY DEPARTMENT AT [facility] Provider Note   CSN: [id number] Arrival date & time: 01/02/24  9260     Patient presents with: Chest Pain and Shortness of Breath   Duane Ortiz is a 73 y.o. male.  Patient reports that he normally gets dialysis Tuesday, Thursday and Saturday.  He reports that his last treatment was 12/25/23.  He reports that yesterday he started having chest tightness and shortness of breath.  He reports that this is worse when he is laying down flat.  He has noticed increased swelling in his lower extremity.  He has had some cough for last few days as well.  He has a fistula in his left arm.  He does not have a plan for dialysis he just recently moved back to [place] on Monday. Hypertensive, vitals signs otherwise okay on arrival. Complains of getting headache after nitroglycerin with EMS.    Chest Pain Associated symptoms: shortness of breath   Shortness of Breath Associated symptoms: chest pain        Prior to Admission medications  Medication Sig Start Date End Date Taking? Authorizing Provider  allopurinol  (ZYLOPRIM ) 100 MG tablet Take 100 mg by mouth at bedtime.    [provider]  amLODipine  (NORVASC ) 10 MG tablet Take 10 mg by mouth at bedtime. 04/06/20   [provider]  ammonium lactate (LAC-HYDRIN) 12 % lotion Apply 1 Application topically daily as needed (itching.). 08/08/18   [provider]  calcitRIOL  (ROCALTROL ) 0.5 MCG capsule Take 2 capsules (1 mcg total) by mouth every Tuesday, Thursday, and Saturday at 6 PM. 03/23/23   Dahal, Chapman, MD  cinacalcet  (SENSIPAR ) 30 MG tablet Take 30 mg by mouth 3 (three) times a week. Take on Tuesday, Thursdays and Saturdays per patient    [provider]  cloNIDine  (CATAPRES ) 0.1 MG tablet Take 0.1 mg by mouth 2 (two) times daily. 01/05/23   [provider]  cyclobenzaprine  (FLEXERIL ) 5 MG tablet Take 5 mg by mouth daily as needed for muscle spasms.  10/25/22   [provider]  hydrALAZINE  (APRESOLINE ) 25 MG tablet Take 1 tablet (25 mg total) by mouth every 8 (eight) hours. 03/22/23   Arlice Chapman, MD  lidocaine -prilocaine  (EMLA ) cream Apply 1 Application topically 3 (three) times a week. Apply on Tuesday, Thursdays, Saturday    [provider]  nebivolol  (BYSTOLIC ) 10 MG tablet Take 10 mg by mouth at bedtime. 12/20/22   [provider]  olmesartan (BENICAR) 20 MG tablet Take 20 mg by mouth at bedtime. 12/10/21   [provider]  tamsulosin  (FLOMAX ) 0.4 MG CAPS capsule Take 0.4 mg by mouth at bedtime.     [provider]    Allergies: Darvon [propoxyphene], Baclofen, Gabapentin, and Sulfa antibiotics    Review of Systems  Respiratory:  Positive for shortness of breath.   Cardiovascular:  Positive for chest pain.    Updated Vital Signs BP (!) 201/98 (BP Location: Right Arm)   Pulse 67   Temp 97.8 F (36.6 C) (Oral)   Resp 17   Ht 5' 11 (1.803 m)   Wt 78.7 kg   SpO2 95%   BMI 24.20 kg/m   Physical Exam Vitals and nursing note reviewed.  Constitutional:      General: He is not in acute distress.    Appearance: He is well-developed.  HENT:     Head: Normocephalic and atraumatic.  Eyes:     Conjunctiva/sclera: Conjunctivae normal.  Cardiovascular:  Rate and Rhythm: Normal rate and regular rhythm.     Heart sounds: No murmur heard. Pulmonary:     Effort: Pulmonary effort is normal. No respiratory distress.     Breath sounds: Rales present.  Abdominal:     Palpations: Abdomen is soft.     Tenderness: There is no abdominal tenderness.  Musculoskeletal:        General: No swelling.     Cervical back: Neck supple.     Right lower leg: Edema present.     Left lower leg: Edema present.  Skin:    General: Skin is warm and dry.     Capillary Refill: Capillary refill takes less than 2 seconds.  Neurological:     Mental Status: He is alert.  Psychiatric:        Mood and  Affect: Mood normal.     (all labs ordered are listed, but only abnormal results are displayed) Labs Reviewed  BASIC METABOLIC PANEL WITH GFR  CBC  PRO BRAIN NATRIURETIC PEPTIDE  TROPONIN T, HIGH SENSITIVITY    EKG: None  Radiology: No results found.   Procedures   Medications Ordered in the ED - No data to display  Clinical Course as of 01/02/24 1107  Tue Jan 02, 2024  1102 Dialysis team including Dr Jerrye working to help patient with outpatient dialysis.  In the meantime, patient to receive dialysis here while in ED and will need to be reassessed after treatment.  [JB]    Clinical Course User Index [JB] Jesalyn Finazzo, Warren SAILOR, PA-C                                 Medical Decision Making Amount and/or Complexity of Data Reviewed Labs: ordered. Radiology: ordered.  Risk Prescription drug management.   This patient presents to the ED for concern of shortness of breath, this involves an extensive number of treatment options, and is a complaint that carries with it a high risk of complications and morbidity.  The differential diagnosis includes CHF, PE, pneumonia, pneumothorax, pulmonary embolus, asthma, COPD   Co morbidities that complicate the patient evaluation  ESRD on dialysis, HLD, HTN, BPH    Additional history obtained:  Additional history obtained from previously getting dialysis in [place]   [facility]   Lab Tests:  I personally interpreted labs.  The pertinent results include:   CBC with hgb 11, no leukocytosis, BMP K 5.9, BUN 99 Cr 18.5 Troponin 84, will get repeat. BNP significantly elevated.    Imaging Studies ordered:  I ordered imaging studies including chest x-ray I independently visualized and interpreted imaging which showed cardiomegaly, pulmonary interstitial prominence with mild pulmonary edema and small pleural effusions. Minimal bibasilar atelectasis. I agree with the radiologist  interpretation   Cardiac Monitoring: / EKG:  The patient was maintained on a cardiac monitor.  I personally viewed and interpreted the cardiac monitored which showed an underlying rhythm of: sinus with T wave abnormalities    Consultations Obtained:  I requested consultation with the Nephrology Dr Jerrye,  and discussed lab and imaging findings as well as pertinent plan - they recommend: Dialysis while in ED, hold off on admission at this time.    Problem List / ED Course / Critical interventions / Medication management  Patient reporting to ER with complaint of CP, chest tightness, SOB and bilateral lower extremity swelling. Patient reports that he requires dialysis, but  he moved to [facility] and has no where to go for this and has missed treatment for a week. He appears significantly fluid overloaded on exam. EKG with no ischemia. Will check labs and chest x-ray. Suspect patient will require dialysis today. He is unable to lay down due to severe orthopnea and quite hypertensive.  Patient's BMP shows potassium of 5.9 and he will receive Lokelma  for this and he will go to dialysis as well.  No EKG change evident.  Chest x-ray is consistent with fluid overload consistent with needing dialysis.  Troponin is mildly elevated but I suspect this is secondary to demand ischemia. I have reviewed the patients home medicines and have made adjustments as needed. Patient will go to dialysis and return to emergency department after for reevaluation and likely discharge. Tracey with dialysis coordinator working to schedule outpatient dialysis at this time.       Final diagnoses:  Shortness of breath  ESRD on dialysis (HCC)  Elevated blood pressure reading    ED Discharge Orders     None          Shermon Warren SAILOR, PA-C 01/02/24 1353  "

## 2024-01-02 NOTE — ED Triage Notes (Signed)
 Pt bib ems from home c/o CP and sob for two days. Pt states he feels like he cannot take a deep breath. Pt is in between moving from [facility] and he has missed HD for a week. Pt last tx Monday 12/25/2023.  Pt states he has hx of blood clots.  HD T/TH/Sat  Pt denies blurred vision, backpain, N/V, and dizziness.   324 mg Aspirin 0.4 Nitroglycerin    BP 217/101 HR 70 CBG 109 RA 98%

## 2024-01-02 NOTE — Progress Notes (Signed)
" °   01/02/24 1810  Vitals  Temp 97.9 F (36.6 C)  Pulse Rate 70  Resp 17  BP (!) 201/92  SpO2 99 %  O2 Device Room Air  Weight (S)   (Unable to obtain weight)  Type of Weight Post-Dialysis  Post Treatment  Dialyzer Clearance Clear  Liters Processed 67.1  Fluid Removed (mL) 2700 mL  Tolerated HD Treatment Yes  Post-Hemodialysis Comments Pt. has no complaints  AVG/AVF Arterial Site Held (minutes) 10 minutes  AVG/AVF Venous Site Held (minutes) 10 minutes    "

## 2024-01-03 NOTE — Discharge Planning (Signed)
" °  [facility] Dialysis Discharge Orders    Patient Name: Duane Ortiz  Transferring to: [facility] [place] - can start Saturday 01/06/2024  S/p HD at [facility] on 12/30, and planned at 01/04/24  Discharge Labs: Basic Metabolic Panel: Recent Labs  Lab 01/02/24 1020 01/02/24 1021  NA 136 138  K 5.7* 5.9*  CL 105 98  CO2  --  17*  GLUCOSE 79 87  BUN 96* 99*  CREATININE >18.00* 18.50*  CALCIUM  --  8.8*   CBC: Recent Labs  Lab 01/02/24 1020 01/02/24 1022  WBC  --  6.5  HGB 11.6* 11.0*  HCT 34.0* 34.7*  MCV  --  89.2  PLT  --  192   Dialysis Orders: 3:30hr 180 dialyzer BFR 400 DFR A1.5 EDW 76kg 2K/2Ca bath LUE AVG no UF or Na profile  Allergies: sulfa abx, darvon, baclofen, gabapentin  Dialysis Meds: Mircera: Hold until < 11 Venofer: Per protocol Calcitriol  1.5mcg PO q HD Sensipar  30mg  PO q HD   Lamarr JONELLE Boehringer, PA-C 01/03/2024, 3:02 PM  [facility] [phone]  "

## 2024-01-03 NOTE — Progress Notes (Signed)
 Late Note Entry- Jan 03, 2024  Pt has been accepted at [facility] [place] on TTS 6:50 am chair time. Pt can start on Saturday, Jan 3 and will need to arrive at 6:20 am for appt. Navigator attempted to reach pt via phone but pt did not answer and voicemail was full. Navigator called pt's son (listed as an emergency contact) and left a message requesting that son have pt return call to navigator to discuss HD arrangements. Navigator also left [facility] schedule letter with inpt HD staff to provide to pt tomorrow if pt comes to hospital for HD. Clinic arrangements noted on schedule letter. Update provided to nephrologist and renal PA as well.   Randine Mungo Dialysis Navigator [phone]

## 2024-01-05 NOTE — Progress Notes (Signed)
 Late Note entry- Jan 05, 2024  Pt did not come to hospital for HD yesterday per HD staff. Attempted to reach pt via phone this morning to discuss out-pt HD arrangements but pt did not answer and voicemail was full. Sent pt a HIPAA compliant text requesting a return call. Navigator never received a call from pt's son either.   Randine Mungo Dialysis Navigator [phone]

## 2024-01-08 NOTE — Progress Notes (Addendum)
 Late Note Entry- Jan 08, 2024  Attempted to reach pt and pt's son via phone to discuss out-pt HD arrangements. Unable to reach pt and unable to leave a message. Sent pt a HIPAA compliant text requesting a return call. Left message for pt's son to please return call to navigator or have pt call navigator to discuss pt's HD arrangements.   Randine Mungo Dialysis Navigator [phone]  Addendum at 3:42 pm: Navigator received approval to contact local law enforcement with request for welfare check on pt. Contacted [facility] non-emergent and requested welfare check on pt. Requested that pt please be advised to contact dialysis navigator back if possible.

## 2024-01-09 ENCOUNTER — Encounter (HOSPITAL_COMMUNITY): Payer: Self-pay

## 2024-01-09 ENCOUNTER — Emergency Department (HOSPITAL_COMMUNITY)
Admission: EM | Admit: 2024-01-09 | Discharge: 2024-01-09 | Disposition: A | Discharge status: Home / Self Care | Attending: Emergency Medicine | Admitting: Emergency Medicine

## 2024-01-09 DIAGNOSIS — Z992 Dependence on renal dialysis: Secondary | ICD-10-CM | POA: Diagnosis not present

## 2024-01-09 DIAGNOSIS — E1122 Type 2 diabetes mellitus with diabetic chronic kidney disease: Secondary | ICD-10-CM | POA: Diagnosis not present

## 2024-01-09 DIAGNOSIS — I120 Hypertensive chronic kidney disease with stage 5 chronic kidney disease or end stage renal disease: Secondary | ICD-10-CM | POA: Insufficient documentation

## 2024-01-09 DIAGNOSIS — N186 End stage renal disease: Secondary | ICD-10-CM | POA: Insufficient documentation

## 2024-01-09 DIAGNOSIS — R0602 Shortness of breath: Secondary | ICD-10-CM | POA: Diagnosis present

## 2024-01-09 LAB — COMPREHENSIVE METABOLIC PANEL WITH GFR
ALT: 29 U/L (ref 0–44)
AST: 25 U/L (ref 15–41)
Albumin: 3.9 g/dL (ref 3.5–5.0)
Alkaline Phosphatase: 80 U/L (ref 38–126)
Anion gap: 22 — ABNORMAL HIGH (ref 5–15)
BUN: 113 mg/dL — ABNORMAL HIGH (ref 8–23)
CO2: 17 mmol/L — ABNORMAL LOW (ref 22–32)
Calcium: 8.7 mg/dL — ABNORMAL LOW (ref 8.9–10.3)
Chloride: 99 mmol/L (ref 98–111)
Creatinine, Ser: 19.6 mg/dL — ABNORMAL HIGH (ref 0.61–1.24)
GFR, Estimated: 2 mL/min — ABNORMAL LOW
Glucose, Bld: 116 mg/dL — ABNORMAL HIGH (ref 70–99)
Potassium: 6.1 mmol/L — ABNORMAL HIGH (ref 3.5–5.1)
Sodium: 138 mmol/L (ref 135–145)
Total Bilirubin: 0.4 mg/dL (ref 0.0–1.2)
Total Protein: 7.3 g/dL (ref 6.5–8.1)

## 2024-01-09 LAB — CBC WITH DIFFERENTIAL/PLATELET
Abs Immature Granulocytes: 0.03 K/uL (ref 0.00–0.07)
Basophils Absolute: 0.1 K/uL (ref 0.0–0.1)
Basophils Relative: 1 %
Eosinophils Absolute: 0.1 K/uL (ref 0.0–0.5)
Eosinophils Relative: 2 %
HCT: 32.6 % — ABNORMAL LOW (ref 39.0–52.0)
Hemoglobin: 10.3 g/dL — ABNORMAL LOW (ref 13.0–17.0)
Immature Granulocytes: 1 %
Lymphocytes Relative: 15 %
Lymphs Abs: 1 K/uL (ref 0.7–4.0)
MCH: 28.6 pg (ref 26.0–34.0)
MCHC: 31.6 g/dL (ref 30.0–36.0)
MCV: 90.6 fL (ref 80.0–100.0)
Monocytes Absolute: 0.5 K/uL (ref 0.1–1.0)
Monocytes Relative: 8 %
Neutro Abs: 4.8 K/uL (ref 1.7–7.7)
Neutrophils Relative %: 73 %
Platelets: 167 K/uL (ref 150–400)
RBC: 3.6 MIL/uL — ABNORMAL LOW (ref 4.22–5.81)
RDW: 14.6 % (ref 11.5–15.5)
WBC: 6.5 K/uL (ref 4.0–10.5)
nRBC: 0 % (ref 0.0–0.2)

## 2024-01-09 LAB — HEPATITIS B SURFACE ANTIGEN: Hepatitis B Surface Ag: NONREACTIVE

## 2024-01-09 MED ORDER — ALTEPLASE 2 MG IJ SOLR: 2.0000 mg | Freq: Once | INTRAMUSCULAR | Status: DC | PRN

## 2024-01-09 MED ORDER — LIDOCAINE HCL (PF) 1 % IJ SOLN: 5.0000 mL | INTRAMUSCULAR | Status: DC | PRN

## 2024-01-09 MED ORDER — HEPARIN SODIUM (PORCINE) 1000 UNIT/ML IJ SOLN
3000.0000 [IU] | Freq: Once | INTRAMUSCULAR | Status: AC
  Administered 2024-01-09: 3000 [IU]

## 2024-01-09 MED ORDER — ANTICOAGULANT SODIUM CITRATE 4% (200MG/5ML) IV SOLN: 5.0000 mL | Status: DC | PRN

## 2024-01-09 MED ORDER — HEPARIN SODIUM (PORCINE) 1000 UNIT/ML DIALYSIS: 1000.0000 [IU] | INTRAMUSCULAR | Status: DC | PRN

## 2024-01-09 MED ORDER — PENTAFLUOROPROP-TETRAFLUOROETH EX AERO: 1.0000 | INHALATION_SPRAY | CUTANEOUS | Status: DC | PRN

## 2024-01-09 MED ORDER — LIDOCAINE-PRILOCAINE 2.5-2.5 % EX CREA: 1.0000 | TOPICAL_CREAM | CUTANEOUS | Status: DC | PRN

## 2024-01-09 NOTE — Progress Notes (Signed)
 Advised by renal PA of pt's being in the ED this afternoon. Met with pt and pt's son in ED with renal PA. Both advised of pt's acceptance at [facility] [place] on TTS 6:50 am chair time. Pt aware that he needs to arrive at 6:20 am on Thursday for first treatment. Pt and pt's son voice understanding and agreeable to plans. Will add HD arrangements to pt's AVS as well. Contacted [facility] [place] to be advised that navigator met with pt today in ED and that pt will start at clinic on Thursday.   Randine Mungo Dialysis Navigator [phone]

## 2024-01-09 NOTE — ED Provider Triage Note (Signed)
 Emergency Medicine Provider Triage Evaluation Note  Tylan Kinn , a 74 y.o. male  was evaluated in triage.  Pt complains of requesting dialysis.  Notes he goes Tuesday, Thursday, Saturday.  Just moved back to this area and states did not establish with clinic yet.  States no one from clinic called him.  Did have dialysis in the ED on Saturday per patient.  Notes mild shortness of breath.  Denies chest pain.  I attempted to call [facility] [place] dialysis but unfortunately cannot dialysis patient today.  He is excepted there and has a 6:50 AM chair time Tuesday, Thursday, Saturday.  Review of Systems  Positive: See above Negative: Chest pain, headache, dizziness, syncope  Physical Exam  BP (!) 173/86   Pulse (!) 56   Temp 98.8 F (37.1 C)   Resp 18   SpO2 96%  Gen:   Awake, no distress   Resp:  Normal effort  MSK:   Moves extremities without difficulty  Other:  Lungs clear  Medical Decision Making  Medically screening exam initiated at 1:42 PM.  Appropriate orders placed.  Wilfrido Luedke was informed that the remainder of the evaluation will be completed by another provider, this initial triage assessment does not replace that evaluation, and the importance of remaining in the ED until their evaluation is complete.     Neysa Thersia RAMAN, [place] 01/09/24 1349

## 2024-01-09 NOTE — ED Triage Notes (Addendum)
 Pt needs dialysis, last treatment Saturday, has not missed any treatments. Pt is SOB at this time. Pt does not have dialysis clinic at this time. Left arm restricted

## 2024-01-09 NOTE — Progress Notes (Addendum)
 Noted that pt is in triage, able to go down with our renal navigator and meet with patient and his son. We have been trying to contact him for the past week to give him outpatient HD arrangement. Does have outpatient HD slot - TTS at [facility]. Unfortunately, he has not been dialyzed in a week. Labs pending and anticipate K to be high. Will add on a short HD for him - will likely be tomorrow AM at this time. After HD, he will be ok to leave and then start outpatient HD on Thursday 01/11/24.  Izetta Boehringer, PA-C [facility] Pager [phone]   ADDENDUM: Able to get him on for dialysis today - will try to get him soon!

## 2024-01-09 NOTE — Discharge Instructions (Signed)
 Follow-up at your outpatient dialysis center as instructed by the kidney team.  We have included the information in these discharge instructions.

## 2024-01-09 NOTE — ED Notes (Signed)
 Pt arrived from HD for disposition. Pt denies complaints at this time. Pending discharge

## 2024-01-09 NOTE — Progress Notes (Signed)
" °   01/09/24 2009  Vitals  Temp 98.2 F (36.8 C)  Temp Source Oral  BP (!) 192/69  MAP (mmHg) 106  Post Treatment  Dialyzer Clearance Heavily streaked  Hemodialysis Intake (mL) 0 mL  Liters Processed 65.6  Fluid Removed (mL) 2400 mL  Tolerated HD Treatment Yes  AVG/AVF Arterial Site Held (minutes) 10 minutes  AVG/AVF Venous Site Held (minutes) 10 minutes  Fistula / Graft Left Upper arm  No placement date or time found.   Orientation: Left  Access Location: Upper arm  Site Condition No complications  Fistula / Graft Assessment Present;Thrill;Bruit  Status Deaccessed  Drainage Description None    "

## 2024-01-09 NOTE — Progress Notes (Signed)
 Late Note Entry- Jan 09, 2024  [facility] [employer] this morning to request an update on welfare check that was requested yesterday. Navigator advised that contact was made with pt. However, it is unknown if pt was provided info in regards to pt needing to contact navigator re: HD arrangements. Navigator has not received a call from pt. Attempted to reach pt via phone again this morning with no answer and unable to leave a message.   Randine Mungo Dialysis Navigator [phone]

## 2024-01-09 NOTE — ED Provider Notes (Signed)
 " [place] EMERGENCY DEPARTMENT AT [facility] Provider Note   CSN: [id number] Arrival date & time: 01/09/24  1140     Patient presents with: Needs Dialysis   Duane Ortiz is a 74 y.o. male.   HPI   Patient has a history of hypertension diabetes, a gout, chronic kidney disease on dialysis.  Patient last went to dialysis on Saturday.  He presented to the ED complaining of feeling short of breath.  Patient did not have an established outpatient dialysis center in this area.  Patient was screened by the triage provider and was evaluated nephrology prior to my evaluation.  He has already received hemodialysis today in the hospital.  Patient states he is feeling fine now.  He is ready for discharge  Prior to Admission medications  Medication Sig Start Date End Date Taking? Authorizing Provider  allopurinol  (ZYLOPRIM ) 100 MG tablet Take 100 mg by mouth at bedtime.    [provider]  amLODipine  (NORVASC ) 10 MG tablet Take 10 mg by mouth at bedtime. 04/06/20   [provider]  ammonium lactate (LAC-HYDRIN) 12 % lotion Apply 1 Application topically daily as needed (itching.). 08/08/18   [provider]  calcitRIOL  (ROCALTROL ) 0.5 MCG capsule Take 2 capsules (1 mcg total) by mouth every Tuesday, Thursday, and Saturday at 6 PM. 03/23/23   Dahal, Chapman, MD  cinacalcet  (SENSIPAR ) 30 MG tablet Take 30 mg by mouth 3 (three) times a week. Take on Tuesday, Thursdays and Saturdays per patient    [provider]  cloNIDine  (CATAPRES ) 0.1 MG tablet Take 0.1 mg by mouth 2 (two) times daily. 01/05/23   [provider]  cyclobenzaprine  (FLEXERIL ) 5 MG tablet Take 5 mg by mouth daily as needed for muscle spasms. 10/25/22   [provider]  hydrALAZINE  (APRESOLINE ) 25 MG tablet Take 1 tablet (25 mg total) by mouth every 8 (eight) hours. 03/22/23   Arlice Chapman, MD  lidocaine -prilocaine  (EMLA ) cream Apply 1 Application topically 3 (three) times a week.  Apply on Tuesday, Thursdays, Saturday    [provider]  nebivolol  (BYSTOLIC ) 10 MG tablet Take 10 mg by mouth at bedtime. 12/20/22   [provider]  olmesartan (BENICAR) 20 MG tablet Take 20 mg by mouth at bedtime. 12/10/21   [provider]  tamsulosin  (FLOMAX ) 0.4 MG CAPS capsule Take 0.4 mg by mouth at bedtime.     [provider]    Allergies: Darvon [propoxyphene], Baclofen, Gabapentin, and Sulfa antibiotics    Review of Systems  Updated Vital Signs BP (!) 215/83 (BP Location: Right Arm)   Pulse 62   Temp 98 F (36.7 C)   Resp 16   Wt 83.8 kg   SpO2 97%   BMI 25.77 kg/m   Physical Exam Vitals and nursing note reviewed.  Constitutional:      General: He is not in acute distress.    Appearance: He is well-developed. He is not diaphoretic.  HENT:     Head: Normocephalic and atraumatic.     Right Ear: External ear normal.     Left Ear: External ear normal.  Eyes:     General: No scleral icterus.       Right eye: No discharge.        Left eye: No discharge.     Conjunctiva/sclera: Conjunctivae normal.  Neck:     Trachea: No tracheal deviation.  Cardiovascular:     Rate and Rhythm: Normal rate.  Pulmonary:  Effort: Pulmonary effort is normal. No respiratory distress.     Breath sounds: No stridor.  Abdominal:     General: There is no distension.  Musculoskeletal:        General: No swelling or deformity.     Cervical back: Neck supple.  Skin:    General: Skin is warm and dry.     Findings: No rash.  Neurological:     Mental Status: He is alert. Mental status is at baseline.     Cranial Nerves: No dysarthria or facial asymmetry.     Motor: No seizure activity.     (all labs ordered are listed, but only abnormal results are displayed) Labs Reviewed  COMPREHENSIVE METABOLIC PANEL WITH GFR - Abnormal; Notable for the following components:      Result Value   Potassium 6.1 (*)    CO2 17 (*)    Glucose, Bld 116 (*)     BUN 113 (*)    Creatinine, Ser 19.60 (*)    Calcium 8.7 (*)    GFR, Estimated 2 (*)    Anion gap 22 (*)    All other components within normal limits  CBC WITH DIFFERENTIAL/PLATELET - Abnormal; Notable for the following components:   RBC 3.60 (*)    Hemoglobin 10.3 (*)    HCT 32.6 (*)    All other components within normal limits  HEPATITIS B SURFACE ANTIGEN    EKG: None  Radiology: No results found.   Procedures   Medications Ordered in the ED  heparin  sodium (porcine) injection 3,000 Units (3,000 Units Intracatheter Given 01/09/24 1700)    Clinical Course as of 01/09/24 2121  Tue Jan 09, 2024  2120 Labs notable for hyperkalemia and elevated BUN/creatinine consistent with his chronic kidney disease [JK]    Clinical Course User Index [JK] Randol Simmonds, MD                                 Medical Decision Making  Patient was evaluated by nephrology.  He did receive dialysis.  Patient states he feels fine now.  No complaints.  Plan will be for him to continue outpatient hemodialysis on January 8.  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

## 2024-01-10 NOTE — Progress Notes (Addendum)
 Late note entry 1/7 1013am  D/c noted. Contacted [facility] and informed of pt start tomorrow. Clinic expressed understanding. Faxed over hep be antigen directly to [facility] at this time. No further support needed at this time.   Lavanda Prestin [facility]unch Dialysis Navigator [phone]
